# Patient Record
Sex: Female | Born: 1960
Health system: Southern US, Community
[De-identification: ages and names within clinical notes are randomized; demographics above are authoritative.]

## PROBLEM LIST (undated history)

## (undated) DIAGNOSIS — R0600 Dyspnea, unspecified: Secondary | ICD-10-CM

## (undated) DIAGNOSIS — J45909 Unspecified asthma, uncomplicated: Secondary | ICD-10-CM

## (undated) DIAGNOSIS — G4730 Sleep apnea, unspecified: Secondary | ICD-10-CM

## (undated) DIAGNOSIS — G5601 Carpal tunnel syndrome, right upper limb: Secondary | ICD-10-CM

## (undated) DIAGNOSIS — I10 Essential (primary) hypertension: Secondary | ICD-10-CM

## (undated) DIAGNOSIS — E119 Type 2 diabetes mellitus without complications: Secondary | ICD-10-CM

## (undated) DIAGNOSIS — K219 Gastro-esophageal reflux disease without esophagitis: Secondary | ICD-10-CM

## (undated) DIAGNOSIS — E7800 Pure hypercholesterolemia, unspecified: Secondary | ICD-10-CM

## (undated) DIAGNOSIS — G8929 Other chronic pain: Secondary | ICD-10-CM

## (undated) DIAGNOSIS — F419 Anxiety disorder, unspecified: Secondary | ICD-10-CM

## (undated) DIAGNOSIS — K2270 Barrett's esophagus without dysplasia: Secondary | ICD-10-CM

## (undated) DIAGNOSIS — M1990 Unspecified osteoarthritis, unspecified site: Secondary | ICD-10-CM

## (undated) DIAGNOSIS — F410 Panic disorder [episodic paroxysmal anxiety] without agoraphobia: Secondary | ICD-10-CM

## (undated) DIAGNOSIS — R7303 Prediabetes: Secondary | ICD-10-CM

## (undated) DIAGNOSIS — R112 Nausea with vomiting, unspecified: Secondary | ICD-10-CM

## (undated) DIAGNOSIS — I209 Angina pectoris, unspecified: Secondary | ICD-10-CM

## (undated) DIAGNOSIS — Z9889 Other specified postprocedural states: Secondary | ICD-10-CM

## (undated) HISTORY — PX: KNEE ARTHROSCOPY: SUR90

## (undated) HISTORY — PX: FOOT SURGERY: SHX648

## (undated) HISTORY — DX: Type 2 diabetes mellitus without complications: E11.9

## (undated) HISTORY — PX: ABDOMINAL HYSTERECTOMY: SHX81

## (undated) HISTORY — DX: Barrett's esophagus without dysplasia: K22.70

## (undated) HISTORY — PX: CHOLECYSTECTOMY: SHX55

## (undated) HISTORY — PX: HAND SURGERY: SHX662

---

## 1999-10-21 HISTORY — PX: PARTIAL HYSTERECTOMY: SHX80

## 2001-08-10 ENCOUNTER — Other Ambulatory Visit: Admission: RE | Admit: 2001-08-10 | Discharge: 2001-08-10 | Payer: Self-pay | Admitting: Obstetrics and Gynecology

## 2001-09-08 ENCOUNTER — Ambulatory Visit (HOSPITAL_COMMUNITY): Admission: RE | Admit: 2001-09-08 | Discharge: 2001-09-08 | Payer: Self-pay | Admitting: Obstetrics and Gynecology

## 2001-09-08 ENCOUNTER — Encounter: Payer: Self-pay | Admitting: Obstetrics and Gynecology

## 2003-06-12 ENCOUNTER — Emergency Department (HOSPITAL_COMMUNITY): Admission: EM | Admit: 2003-06-12 | Discharge: 2003-06-12 | Payer: Self-pay | Admitting: Emergency Medicine

## 2005-01-01 ENCOUNTER — Ambulatory Visit: Payer: Self-pay | Admitting: Cardiology

## 2005-01-03 ENCOUNTER — Ambulatory Visit: Payer: Self-pay | Admitting: Pulmonary Disease

## 2005-01-03 ENCOUNTER — Ambulatory Visit: Payer: Self-pay | Admitting: Cardiology

## 2005-01-03 ENCOUNTER — Encounter: Payer: Self-pay | Admitting: Cardiology

## 2005-01-03 ENCOUNTER — Inpatient Hospital Stay (HOSPITAL_COMMUNITY): Admission: AD | Admit: 2005-01-03 | Discharge: 2005-01-08 | Payer: Self-pay | Admitting: Cardiology

## 2005-01-06 ENCOUNTER — Encounter: Payer: Self-pay | Admitting: Cardiology

## 2005-01-07 ENCOUNTER — Encounter: Payer: Self-pay | Admitting: Cardiovascular Disease

## 2005-01-14 ENCOUNTER — Encounter: Payer: Self-pay | Admitting: Cardiology

## 2007-08-09 ENCOUNTER — Encounter: Payer: Self-pay | Admitting: Cardiology

## 2007-11-08 ENCOUNTER — Ambulatory Visit: Payer: Self-pay | Admitting: Orthopedic Surgery

## 2007-11-23 ENCOUNTER — Encounter: Payer: Self-pay | Admitting: Orthopedic Surgery

## 2007-12-01 ENCOUNTER — Ambulatory Visit: Payer: Self-pay | Admitting: Orthopedic Surgery

## 2007-12-28 ENCOUNTER — Ambulatory Visit: Payer: Self-pay | Admitting: Orthopedic Surgery

## 2007-12-31 ENCOUNTER — Ambulatory Visit (HOSPITAL_COMMUNITY): Admission: RE | Admit: 2007-12-31 | Discharge: 2007-12-31 | Payer: Self-pay | Admitting: Orthopedic Surgery

## 2007-12-31 ENCOUNTER — Ambulatory Visit: Payer: Self-pay | Admitting: Orthopedic Surgery

## 2008-01-04 ENCOUNTER — Ambulatory Visit: Payer: Self-pay | Admitting: Orthopedic Surgery

## 2008-01-12 ENCOUNTER — Ambulatory Visit: Payer: Self-pay | Admitting: Orthopedic Surgery

## 2008-02-07 ENCOUNTER — Encounter: Payer: Self-pay | Admitting: Orthopedic Surgery

## 2008-02-14 ENCOUNTER — Ambulatory Visit: Payer: Self-pay | Admitting: Orthopedic Surgery

## 2008-03-07 ENCOUNTER — Ambulatory Visit: Payer: Self-pay | Admitting: Orthopedic Surgery

## 2008-03-22 ENCOUNTER — Ambulatory Visit: Payer: Self-pay | Admitting: Orthopedic Surgery

## 2008-03-27 ENCOUNTER — Telehealth: Payer: Self-pay | Admitting: Orthopedic Surgery

## 2008-04-19 ENCOUNTER — Ambulatory Visit: Payer: Self-pay | Admitting: Orthopedic Surgery

## 2008-06-01 ENCOUNTER — Telehealth (INDEPENDENT_AMBULATORY_CARE_PROVIDER_SITE_OTHER): Payer: Self-pay | Admitting: *Deleted

## 2008-07-25 ENCOUNTER — Encounter: Payer: Self-pay | Admitting: Orthopedic Surgery

## 2008-07-31 ENCOUNTER — Ambulatory Visit: Payer: Self-pay | Admitting: Orthopedic Surgery

## 2008-08-07 ENCOUNTER — Telehealth: Payer: Self-pay | Admitting: Orthopedic Surgery

## 2008-08-07 ENCOUNTER — Encounter: Payer: Self-pay | Admitting: Orthopedic Surgery

## 2008-08-07 ENCOUNTER — Encounter (HOSPITAL_COMMUNITY): Admission: RE | Admit: 2008-08-07 | Discharge: 2008-09-06 | Payer: Self-pay | Admitting: Orthopedic Surgery

## 2008-08-15 ENCOUNTER — Encounter: Payer: Self-pay | Admitting: Cardiology

## 2008-08-21 ENCOUNTER — Ambulatory Visit (HOSPITAL_BASED_OUTPATIENT_CLINIC_OR_DEPARTMENT_OTHER): Admission: RE | Admit: 2008-08-21 | Discharge: 2008-08-21 | Payer: Self-pay | Admitting: Orthopedic Surgery

## 2008-08-22 ENCOUNTER — Telehealth: Payer: Self-pay | Admitting: Orthopedic Surgery

## 2008-10-18 ENCOUNTER — Encounter: Payer: Self-pay | Admitting: Family Medicine

## 2008-11-01 ENCOUNTER — Encounter: Payer: Self-pay | Admitting: Orthopedic Surgery

## 2009-05-31 ENCOUNTER — Ambulatory Visit: Payer: Self-pay | Admitting: Cardiology

## 2009-05-31 DIAGNOSIS — R079 Chest pain, unspecified: Secondary | ICD-10-CM | POA: Insufficient documentation

## 2009-06-05 ENCOUNTER — Encounter: Payer: Self-pay | Admitting: Cardiology

## 2009-06-05 ENCOUNTER — Ambulatory Visit: Payer: Self-pay | Admitting: Cardiology

## 2009-06-15 ENCOUNTER — Telehealth: Payer: Self-pay | Admitting: Cardiology

## 2009-06-18 ENCOUNTER — Encounter (INDEPENDENT_AMBULATORY_CARE_PROVIDER_SITE_OTHER): Payer: Self-pay | Admitting: *Deleted

## 2009-06-20 ENCOUNTER — Encounter: Payer: Self-pay | Admitting: Cardiology

## 2009-06-20 DIAGNOSIS — R0609 Other forms of dyspnea: Secondary | ICD-10-CM | POA: Insufficient documentation

## 2009-06-20 DIAGNOSIS — R0602 Shortness of breath: Secondary | ICD-10-CM | POA: Insufficient documentation

## 2009-07-19 ENCOUNTER — Encounter: Payer: Self-pay | Admitting: Cardiology

## 2009-07-20 ENCOUNTER — Ambulatory Visit: Payer: Self-pay | Admitting: Cardiology

## 2009-07-20 DIAGNOSIS — I359 Nonrheumatic aortic valve disorder, unspecified: Secondary | ICD-10-CM | POA: Insufficient documentation

## 2009-07-20 DIAGNOSIS — I080 Rheumatic disorders of both mitral and aortic valves: Secondary | ICD-10-CM | POA: Insufficient documentation

## 2010-05-01 ENCOUNTER — Ambulatory Visit: Payer: Self-pay | Admitting: Orthopedic Surgery

## 2010-05-01 DIAGNOSIS — G5600 Carpal tunnel syndrome, unspecified upper limb: Secondary | ICD-10-CM | POA: Insufficient documentation

## 2010-11-19 NOTE — Assessment & Plan Note (Signed)
Summary: rt wrist pain/2 carp tun releases by Dr. H/medcost/bsf   Visit Type:  Follow-up Primary Provider:  Crosby Oyster, PA-C  CC:  pain right wrist and hand.  History of Present Illness: I saw Rhonda Patton in the office today for a followup visit.  She is a 50 years old woman with the complaint of:  pain right wrist and right finger numbness.  she is status post 2 carpal tunnel releases on the RIGHT most recent in March of 2009 she's also had some flexor carpi radialis tendinitis and presents back with continued symptoms including some numbness and tingling in the carpal tunnel innervated areas of pain along the wrist and the FCR.  Previous treatment included Neurontin anti-inflammatories and steroids as well as a cast.  She couldn't tolerate the cast because of claustrophobia.  She had a repeat nerve conduction study after last surgery showed mild carpal tunnel currently taking Tylenol for pain without relief  NCS results taken 07-19-08.  Tylenol for pain no relief.  she has a wrist splint made at the hospital which is non-circumferential and she wears them intermittently     Allergies: 1)  ! Asa  Physical Exam  Additional Exam:  examination shows a well-developed well-nourished female in no acute distress normal pulses and perfusion with good capillary refill in the RIGHT hand.  Skin incision is somewhat hypertrophic and nontender.  She has decreased sensation in the index finger and long finger and somewhat in the thumb.  She is awake alert and oriented x3 mood and affect are normal  Inspection reveals no swelling over the incision but tenderness range of motion is normal, strength is weak.  Wrist is stable.  Tinel's is negative.   Impression & Recommendations:  Problem # 1:  CARPAL TUNNEL SYNDROME (ICD-354.0) Assessment Unchanged  I would say that she is really failed to carpal tunnel releases she also has some flexor carpi radialis tendinitis and what I would  now, chronic pain  Recommend anti-inflammatories and Neurontin him back 3 months reassess  Orders: Est. Patient Level III (62836)  Medications Added to Medication List This Visit: 1)  Neurontin 100 Mg Caps (Gabapentin) .Marland Kitchen.. 1 by mouth three times a day, increase to 2 tabs three times a day then 3 tabs three times a day 2)  Etodolac 300 Mg Caps (Etodolac) .Marland Kitchen.. 1 by mouth two times a day  Patient Instructions: 1)  f/u 3 months  Prescriptions: ETODOLAC 300 MG CAPS (ETODOLAC) 1 by mouth two times a day  #60 x 5   Entered and Authorized by:   Fuller Canada MD   Signed by:   Fuller Canada MD on 05/01/2010   Method used:   Print then Give to Patient   RxID:   [id number] NEURONTIN 100 MG CAPS (GABAPENTIN) 1 by mouth three times a day, increase to 2 tabs three times a day then 3 tabs three times a day  #90 x 5   Entered and Authorized by:   Fuller Canada MD   Signed by:   Fuller Canada MD on 05/01/2010   Method used:   Print then Give to Patient   RxID:   [id number]

## 2011-03-04 NOTE — Op Note (Signed)
Rhonda Patton, LOYAL           ACCOUNT NO.:  [id number]   MEDICAL RECORD NO.:  [id number]          PATIENT TYPE:  AMB   LOCATION:  [facility]                         FACILITY:  [facility]   PHYSICIAN:  Marlowe Kays, M.D.  DATE OF BIRTH:  [birth year]   DATE OF PROCEDURE:  08/21/2008  DATE OF DISCHARGE:                               OPERATIVE REPORT   PREOPERATIVE DIAGNOSES:  1. Recurrent tear of medial meniscus.  2. Osteoarthritis right knee.   POSTOPERATIVE DIAGNOSES:  1. Recurrent tear of medial meniscus.  2. Osteoarthritis right knee.  3. Torn lateral meniscus right knee.   OPERATION:  Right knee arthroscopy with partial medial and lateral  meniscectomy.   SURGEON:  Marlowe Kays, M.D.   ASSISTANT:  Nurse.   ANESTHESIA:  General.   PATHOLOGY AND JUSTIFICATION FOR PROCEDURE:  She had a right knee  arthroscopy with partial medial meniscectomy in May 2007, elsewhere.  She saw me because of progressive pain in the inner aspect of her right  knee with an MRI on July 21, 2008, demonstrating a recurrent medial  meniscus tear.  There was also felt to be patellofemoral and medial  compartment arthritis in her right knee   DESCRIPTION OF PROCEDURE:  Satisfactory general anesthesia, Ace wrap to  the left leg, a pneumatic tourniquet to the right lower extremity with  right leg Esmarched out non-sterilely and tourniquet inflated to 350  mmHg, knee support, the right leg was prepped with DuraPrep from support  to ankle and draped in a sterile field.  Superomedial saline inflow.  First from an anterolateral portal, the medial compartment of the knee  joint was evaluated.  She had marked fraying and tearing of the entire  medial meniscus from the mid portion of the middle segment all the way  into the intercondylar area.  I trimmed this back to a stable rim with a  combination of baskets and a 3.5 shaver with the final remnant being  stable on probing.  She had some grade 1-2  chondromalacia of her medial  joint, but nothing that appeared to be terribly significant.  Looking up  in the medial gutter and suprapatellar area, the patellar surface had  nothing shaveable.  I then reversed portals.  The lateral compartment  demonstrated some wear of the lateral tibial plateau and marked fraying  of the entire inner aspect of lateral meniscus, which I shaved down  until smooth with a final picture being taken.  The knee joint was then  irrigated until clear and all fluid possible removed.  I closed the two  anterior portals with 4-0 nylon and then injected through the inflow  apparatus 20  mL of one-half percent Marcaine and adrenaline and 4 mg of morphine.  This portal was then closed with 4-0 nylon as well.  Betadine adaptic  dry sterile dressing were applied.  Tourniquet was released.  She  tolerated the procedure well and was taken to the recovery room in  satisfactory condition with no known complications.           ______________________________  Marlowe Kays, M.D.     JA/MEDQ  D:  08/21/2008  T:  08/21/2008  Job:  [id number]

## 2011-03-04 NOTE — Assessment & Plan Note (Signed)
[facility]                          [facility] NOTE   Rhonda Patton, Rhonda Patton                  MRN:          [id number]  DATE:05/31/2009                            DOB:          [birth year]    HISTORY:  Ms. Mcleish is seen for Cardiology followup.  She had been  seen in our office in the past.  She underwent cardiac catheterization  in 2006.  At that time, her pulmonary pressures were normal.  She had  hyperventilation in the cath lab.  She had some abnormality of the LAD  that was minor.  There was slight decreased flow down the LAD, but there  was no high-grade stenosis.  She had normal right heart pressures.  She  had vigorous LV function.  There was no step-up in the right heart  chambers.  It was felt that pulmonary evaluation might be helpful.  The  patient did have an overnight polysomnogram.  This was read by Dr. Ronnette Juniper.  The patient has mild obstructive sleep apnea with apnea/hypopnea  index of 8 and oxygen desaturation to 80%.  It was felt that she was  mainly symptomatic in the supine position and she should avoid sleeping  supplying.  There should be treatment of her nasal congestion.  She  should lose weight.  She should avoid alcohol and sedatives prior to  bedtime.  If this did not help, she could be considered for CPAP.   We have not seen her in the office for Cardiology follow-up since then.   Most recently she says that she has had shortness of breath for  approximately a month, worse in the last 2 weeks.  There is no clear-cut  chest pain.  There is no syncope or presyncope.   PAST MEDICAL HISTORY:   ALLERGIES:  No known drug allergies.   MEDICATIONS:  triamterene and hydrochlorothiazide, estrogen, diclofenac,  and amlodipine.   OTHER MEDICAL PROBLEMS:  See the list below.   REVIEW OF SYSTEMS:  Today, she denies fever, chills, sweats, headache,  skin rash, change in vision, change in hearing, GI symptoms,  or GU  symptoms.  She is significantly overweight.  All other systems are  reviewed and are negative.  She also has some discomfort in her neck.   PHYSICAL EXAMINATION:  VITAL SIGNS:  Blood pressure 130/86 with a pulse  of 69.  Her weight is 245 pounds.  Her O2 sat is 98% on room air.  GENERAL:  The patient is oriented to person, time, and place.  Affect is  normal.  HEENT:  No xanthelasma.  She has normal extraocular motion.  There are  no carotid bruits.  There is no jugular venous distention.  LUNGS:  Clear.  Respiratory effort is not labored.  CARDIAC:  S1 and S2.  There are no clicks or significant murmurs.  ABDOMEN:  Obese.  There is no significant peripheral edema.   EKG is normal.   PROBLEMS:  1. History of normal coronary arteries with slight decreased flow down      the left anterior descending in the past with no significant  obstruction.  2. History of normal left ventricular function in the past.  3. No evidence of pulmonary hypertension or shunt in the past.  4. Evidence of sleep apnea as described above in the history of      present illness.  5. Significant obesity.  6. Shortness of breath recently.  Etiology is not clear to me at this      point.  I am not convinced of heart failure.  We do need to rule      out change in her LV function.  The patient will have a chest x-ray      and a 2-D echo and we will see her back for followup.  In the      meantime, I try to give her reassurance saying that I thought that      her overall cardiac status is stable.     Luis Abed, MD, [facility]  Electronically Signed    JDK/MedQ  DD: 05/31/2009  DT: 06/01/2009  Job #: [id number]   cc:   Ernst Breach, PA-C

## 2011-03-04 NOTE — H&P (Signed)
Rhonda Patton, Rhonda Patton           ACCOUNT NO.:  [id number]   MEDICAL RECORD NO.:  [id number]          PATIENT TYPE:  AMB   LOCATION:  DAY                           FACILITY:  [facility]   PHYSICIAN:  Vickki Hearing, M.D.DATE OF BIRTH:  27-Sep-1961   DATE OF ADMISSION:  12/30/2007  DATE OF DISCHARGE:  LH                              HISTORY & PHYSICAL   CHIEF COMPLAINT:  Carpal tunnel syndrome, right upper extremity.   HISTORY OF PRESENT ILLNESS:  A 51 year old female complains of right  hand pain and numbness in a median nerve distribution.  She had nerve  conduction study which indicated swelling of the nerve consistent with  carpal tunnel syndrome.  She is status post carpal tunnel release 8  years ago, however, at this time she is having constant pain which is  worse in the morning.  There is numbness at the old incision site and  numbness of the long finger of the right hand.  Pain radiates up into  the palm and forearm and not above.  She took Tylenol for pain which did  not help.  She also took Neurontin and B6 without improvement.   We injected the carpal tunnel on March 11, to see if we could get any  improvement and it did not seem to help.   The patient really wants something done about her symptoms at this time.  She understands that she only has a 75% chance of improvement for a  revision with a carpal tunnel release.  She wishes to proceed ahead.  Informed consent was done in the office.   ALLERGIES:  No known drug allergies.   PAST MEDICAL HISTORY:  1. Reflux.  2. Sinusitis.  3. Arthritis.   PAST SURGICAL HISTORY:  1. Hysterectomy.  2. Gallbladder removed.  3. Three cesarean sections.  4. Surgery on her foot.  5. Right carpal tunnel release in 2000.   FAMILY HISTORY:  Family history of coronary artery disease, arthritis,  cancer as well as diabetes.   SOCIAL HISTORY:  She is married.  She does not smoke or drink.   REVIEW OF SYSTEMS:  She has reflux,  headache, joint pain and swelling.   PHYSICAL EXAMINATION:  GENERAL:  Appearance is normal.  Grooming and  hygiene are normal.  No gross deformities are noted.  SKIN:  There is a scar noted.  EXTREMITIES:  Nontender, healed without any erythema.  There is no  deformity, ecchymosis or swelling in the right upper extremity.  There  is tenderness over the carpal tunnel, however, and also over the flexor  carpi radialis and the palmaris longus.  Vascular exam shows normal  radial, ulnar, brachial and axillary pulses.  Motor strength in upper  extremities is normal except for some slight weakness in the right hand  in terms of grip strength.  Her wrist is stable.  She has full range of  motion.  She has 80 degrees of flexion and extension.  Radial and ulnar  deviation are normal.   ASSESSMENT:  Neurology study from [facility] indicates mild  right median neuropathy consistent with carpal tunnel  syndrome.  The  nerve was swollen to a size of 18-mm versus 11-mm on the opposite side.   PLAN:  Right carpal tunnel release.      Vickki Hearing, M.D.  Electronically Signed     SEH/MEDQ  D:  12/30/2007  T:  12/31/2007  Job:  [id number]   cc:   [facility]  Fax: [phone]

## 2011-03-04 NOTE — Op Note (Signed)
NAMEJOANIE, Rhonda Patton           ACCOUNT NO.:  [id number]   MEDICAL RECORD NO.:  [id number]          PATIENT TYPE:  AMB   LOCATION:  DAY                           FACILITY:  [facility]   PHYSICIAN:  Vickki Hearing, M.D.DATE OF BIRTH:  Mar 02, 1961   DATE OF PROCEDURE:  12/31/2007  DATE OF DISCHARGE:                               OPERATIVE REPORT   HISTORY:  A 50 year old female with recurrent carpal tunnel syndrome  complaining of pain and numbness in the median nerve distribution with a  positive nerve conduction study, status post carpal tunnel release 8  years ago.  She was treated with Tylenol, Neurontin, B6 and did not  improve.  She also had bracing.   Informed consent done in the office.  The patient understands 75% chance  of this helping.   PREOPERATIVE DIAGNOSIS:  Recurrent carpal tunnel syndrome on the right  wrist.   POSTOPERATIVE DIAGNOSIS:  Recurrent carpal tunnel syndrome on the right  wrist.   PROCEDURE:  Revision carpal tunnel release of the right upper extremity.   SURGEON:  Vickki Hearing, M.D.   ASSISTANT:  None.   ANESTHESIA:  Regional anesthetic.   FINDINGS:  Swelling of the median nerve proximal and beneath the carpal  tunnel.   SPECIMENS:  None.   ESTIMATED BLOOD LOSS:  None.   COMPLICATIONS:  None.   COUNTS:  Correct.   DISPOSITION:  The patient to PACU in good condition.   PROCEDURE:  After proper identification and marking of the surgical site  by the patient and physician, history and physical was updated.  The  patient was taken to surgery, given antibiotics, had a regional block.  The right arm was prepped with DuraPrep and draped sterilely.  A timeout  procedure was completed.   The previous incision was used, and then the incision was taken across  the wrist in a curved fashion and then straightened as it went  proximally.  The subcutaneous tissue was divided.  Blunt dissection was  carried down to the distal portion of the  transverse carpal ligament.  The transverse carpal ligament was released.  The median nerve was  released throughout the incision and was found to be intact with  swelling somewhat proximal and also under the transverse carpal  ligament.  No space-occupying lesions were noted.   The wound was irrigated and closed with 3-0 nylon sutures, injected with  10 mL of Marcaine, and she was taken to the recovery room in stable  condition.   Followup will be Monday.  She is discharged on Lorcet Plus for pain  relief.      Vickki Hearing, M.D.  Electronically Signed     SEH/MEDQ  D:  12/31/2007  T:  01/01/2008  Job:  [id number]

## 2011-03-07 NOTE — Discharge Summary (Signed)
Rhonda Patton, FAVOR NO.:  [id number]   MEDICAL RECORD NO.:  [id number]          PATIENT TYPE:  INP   LOCATION:  4709                         FACILITY:  [facility]   PHYSICIAN:  Jonelle Sidle, M.D. LHCDATE OF BIRTH:  [birth year]   DATE OF ADMISSION:  01/03/2005  DATE OF DISCHARGE:  01/08/2005                                 DISCHARGE SUMMARY   PROCEDURES:  1.  Cardiac catheterization.  2.  Right heart catheterization.  3.  Coronary arteriogram.  4.  Left ventriculogram.  5.  Pulmonary function testing.  6.  Abdominal x-ray.   DISCHARGE DIAGNOSES:  1.  Chest pain, no critical coronary artery disease by catheterization, mild      irregularity secondary to spasm.  2.  Low pO2 at baseline, status post pulmonary consult with no evidence of      pulmonary pathology, empiric proton pump inhibitor and followup p.r.n.  3.  Status post echocardiogram with mild left ventricular hypertrophy,      normal ejection fraction and normal valves, no cardiopulmonary disease.  4.  Possible obstructive sleep apnea, by description, follow up at [facility] for sleep study on January 14, 2005.  5.  Gastroesophageal reflux disease.  6.  Status post cholecystectomy with a history of diarrhea, status post      abdominal x-ray showing a large amount of stool compatible with      constipation.  7.  Mild hyperlipidemia with a total cholesterol of 185, triglycerides 114,      HDL 48, LDL 114, checked this admission.   HOSPITAL COURSE:  Rhonda Patton is a 50 year old female with no known  history of coronary artery disease.  She went to [facility] for chest  pain on January 01, 2005.  She was evaluated by Dr. Diona Browner there.  She came  to the office after discharge with chest pressure and was admitted for  further evaluation and treatment.   She continued to have episodes of chest tightness that were not exertional.  Some of them were described as a tightness and some as  a sharp chest pain.  She got some relief with nitroglycerin.  Her cardiac enzymes were negative.  Her D-dimer was within normal limits, so no CT was performed.  Her cardiac  catheterization was performed on January 06, 2005.  The cardiac  catheterization showed mild irregularities only and questionable spasm in  the RCA.  There was no obstructive disease, although there was a question of  slow flow in the LAD.  She had normal right heart pressure.  She had low pO2  at baseline.  An echo and PFTs were ordered.  Her spirometry was normal.  Her MBVs were low, but this is very effort-dependent.  She was evaluated by  Dr. Sung Amabile, who found no evidence of pulmonary pathology.  Dr. Dietrich Pates  evaluated the echocardiogram and found no evidence of cardiac pathology.  She had an episode of sinus tachycardia of unclear etiology with no  associated symptoms, and this resolved.  Her heart rate got to 130.  [name] was considered stable for  discharge on January 08, 2005, with  outpatient followup arranged.   DISCHARGE INSTRUCTIONS:  1.  Her activity level is to include no strenuous activity for two days.  2.  She is to call the office for problems with the cath site.  3.  She is to stick to a low-fat diet.  4.  She has a follow-up appointment with Dr. Diona Browner on April 6th, and she      is to follow up with Dr. Sherryll Burger.  5.  She is to keep the sleep study appointment.   DISCHARGE MEDICATIONS:  1.  Cipro 250 mg b.i.d.  2.  Aspirin 81 mg q.d.  3.  Triamterene/HCTZ 37.5/25 mg q.d.  4.  Protonix 40 mg q.d.      RB/MEDQ  D:  01/08/2005  T:  01/08/2005  Job:  [id number]   cc:   Dr. Sherryll Burger

## 2011-03-07 NOTE — H&P (Signed)
Rhonda Patton, Rhonda Patton NO.:  [id number]   MEDICAL RECORD NO.:  [id number]          PATIENT TYPE:  INP   LOCATION:                               FACILITY:  [facility]   PHYSICIAN:  Jonelle Sidle, M.D. LHCDATE OF BIRTH:  [birth year]   DATE OF ADMISSION:  01/03/2005  DATE OF DISCHARGE:                                HISTORY & PHYSICAL   CHIEF COMPLAINT:  Chest discomfort.   HISTORY OF PRESENT ILLNESS:  A 50 year old [place] American female who was  seen in consultation January 01, 2005, at [facility] for chest  discomfort.  She does not have a history of coronary artery disease,  hypertension, hyperlipidemia, diabetes, or smoking.  She is mildly  overweight, but she does have a positive family history of coronary disease.  She has been under some stress as of late.  She presented to the hospital  with two to three week h of chest pain which was gradual in onset, described  as a substernal pressure with radiation to her right neck, right arm and  right subscapular area.  It seems this can occur at rest or with exertion  and lasts 30 minutes.  She has been having two and three episodes a day for  the last two to three weeks.  Relief is obtained with sitting and resting.  She has associated shortness of breath and some diaphoresis.  However, she  has been going through some premenopausal hot flashes which can occur  independent of her chest discomfort.  She also has some associated nausea,  weakness and fatigue and no energy.  Her troponins were negative x3.  EKG  did not show signs of acute ischemia and she was due to have a CT angio per  Dr. Andee Lineman.  Unfortunately, there was trouble with the machine and with  scheduling and she was scheduled to have an elective outpatient  catheterization this Wednesday.  However, she presents into the office today  complaining of severe substernal chest discomfort.  EKG was done and does  not show any acute change.  However, she  still had persistent chest  discomfort and was given one sublingual nitroglycerin here in the office  which promptly relieved her pain.  Because of this, she is being admitted  now to [facility].  She will convalesce over the  weekend and be cathed the first part of the week or sooner if she has  recurrent pain with EKG changes over the weekend.   Cardiac review of systems is positive for chronic lower extremity edema  which sounds dependent and occurs at the end of the day.  She has some  dyspnea on exertion which she attributes to being overweight and out of  shape.  She has no PND, orthopnea, CVA, TIA, syncope, presyncope, dizziness,  heart failure, MI, claudication or palpitations.  Cardiac risks are negative  for hypertension, hyperlipidemia, diabetes, or smoking.  There is a positive  family history for coronary disease, which will be outlined below.  She is  under an increased amount of stress as of late.  She is status  post partial  hysterectomy and is having some perimenopausal symptoms  with diaphoresis  and hot flashes.  She is active.  She is overweight but not obese.   MEDICATIONS:  1.  Maxzide 37.5/25 daily.  2.  Estropipate 1.2 mg daily.  3.  We have started her on aspirin 81 mg daily.  4.  Protonix 40 mg daily.   ALLERGIES:  No known drug allergies.   REVIEW OF SYMPTOMS:  General health has been good except for her  perimenopausal symptoms.  In talking with her further, she has significant  symptoms suggestive of obstructive sleep apnea which include snoring and  snorting which awakes her husband.  She is tired, fatigued and awakes with a  headache.  She has restless leg syndrome and could fall asleep  inappropriately and on occasions has almost fallen asleep behind the wheel  of a motor vehicle.  GI is positive for chronic diarrhea  after having had  cholecystectomy, she claims things just haven't been right.  She also has  symptoms of GERD  which can be confusing the above presenting symptoms.  She  has chronic headaches which she attributes to sinus problems, depression and  chronic low back pain.   PAST MEDICAL HISTORY:  1.  Positive for possible hypertension per her medication list unless this      is being used solely for edema.  2.  There is no history of known coronary disease, hyperlipidemia or      diabetes.  3.  She has a history of GERD.  4.  Depression.  5.  Osteoarthritis.  6.  Sinus problems.  7.  Status post partial hysterectomy and cholecystectomy.   SOCIAL HISTORY:  She has three grown children.  She is a Futures trader.  She  does not smoke tobacco or drink alcohol.  She is married.   FAMILY HISTORY:  Her mother died at age 29 with congestive heart failure but  had an MI in her early 76s.  Otherwise her immediate family history is  unremarkable for premature coronary disease.   PHYSICAL EXAMINATION:  GENERAL APPEARANCE:  A pleasant, articulate, alert  and oriented African American female complaining of substernal chest  pressure and tightness which was relieved promptly with one sublingual  nitroglycerin.  VITAL SIGNS:  Blood pressure 122/70 which actually went down to 94/69 after  nitroglycerin.  Pulse 58 and regular, weight 223.  HEENT:  Grossly normal.  NECK:  Without carotid bruits, detectable thyromegaly, adenopathy or JVD.  It was supple.  LUNGS:  Clear to auscultation and percussion with good bilateral chest  expansion without any wheezing, rhonchi or rales.  CARDIOVASCULAR:  Heart sounds are regular with normal S1 and S2 without any  murmurs, rubs, or clicks or anterior chest wall tenderness.  ABDOMEN:  Obese, soft and nontender.  Bowel sounds are present.  EXTREMITIES:  Pedal pulses are intact at 2+.  She has trace nonpitting  edema.  NEUROLOGIC:  Nonfocal.   EKG shows normal sinus rhythm, normal axis with no acute STT wave changes suggestive of ischemia.  Chest x-ray done December 31, 2004,  was unremarkable.   LABORATORY DATA:  December 31, 2004, glucose 96, BUN 9, creatinine 1.0.  LDL  was mildly elevated at 138 in January of 2004.  Potassium was low on December 31, 2004, at 3.3, probably secondary to her diuretics.  Troponins were  negative x3.  Repeat troponins have been ordered.  Hemoglobin 13.9,  hematocrit 40, platelet count 224,000.  IMPRESSION:  1.  Chest discomfort with some symptoms that are atypical and typical in a      50 year old [place] American female with a history of some mild      hypertension.  She was ruled out for myocardial infarction, scheduled to      have a CT angio but because of scheduling and equipment problems, this      was not done.  She was set up to have elective catheterization this      coming Wednesday, however, she presented to the office today with      substernal pressure which was promptly relieved with one nitroglycerin.      Therefore, she is being admitted to be watched over the weekend and      catheterization early part of next week.  2.  Gastroesophageal reflux disease.  3.  Status post cholecystectomy with chronic diarrhea.  4.  Perimenopausal symptoms with hot flashes.  5.  Probable obstructive sleep apnea.  She has elected to have a sleep study      done here at [facility] once her cardiac work-up is complete.      ________________________________________  Suszanne Conners. Julious Oka.  ___________________________________________  Jonelle Sidle, M.D. [facility]    MRD/MEDQ  D:  01/03/2005  T:  01/03/2005  Job:  [id number]   cc:   Dr. Clelia Croft

## 2011-03-07 NOTE — Cardiovascular Report (Signed)
NAMEBRALEE, FELDT           ACCOUNT NO.:  [id number]   MEDICAL RECORD NO.:  [id number]          PATIENT TYPE:  INP   LOCATION:  4709                         FACILITY:  [facility]   PHYSICIAN:  Arturo Morton. Riley Kill, M.D. [facility] OF BIRTH:  09/08/1961   DATE OF PROCEDURE:  01/06/2005  DATE OF DISCHARGE:                              CARDIAC CATHETERIZATION   INDICATIONS:  Ms. Hillesheim is a 50 year old woman who presents with chest  discomfort. She apparently had a mother she lost due to congestive heart  failure. She has been fairly anxious. She was admitted and had negative  enzymes.  There was borderline CPK elevation but the MBs were negative. The  current study was done to assess coronary anatomy.   PROCEDURE:  1.  Right and left heart catheterization.  2.  Selective coronary arteriography.  3.  Selective left ventriculography.   DESCRIPTION OF PROCEDURE:  The patient was brought to the catheterization  laboratory, prepped and draped in usual fashion.  Through an anterior  puncture, the right femoral artery was entered with the first stick. A 6-  French sheath was placed. As we began to place a pigtail catheter up into  the central aorta, the patient became nauseated and vomited. She then began  to hyperventilate. With the hyperventilation, the blood pressure came down  slightly, and the heart rate rose up into the 120 range. We were able to  obtain a paper bag, and some low dose oxygen as well as a paper bag was  placed over the patient's mouth. Over about a 10 to 15-minute period the  heart rate gradually slowly came down into the 80s and the blood pressure  rose appropriately. Ventriculography was performed in the RAO projection.  Following this, coronary arteriography was performed in multiple  angiographic projections in both the left and right coronary arteries. There  was some ostial spasm of the right coronary artery, and repeat shots taken  nearly outside the ostium  demonstrated no significant narrowing. Two blood  gases were obtained in revealed pO2 of 62 and 59. Because of this,  I  elected to perform right heart catheterization given the patient's chest  pain, shortness of breath and uncertainty as to diagnosis and question of  sleep apnea.  The femoral vein was punctured with an anterior puncture and a  7-French sheath placed.  A saturation was obtained in the superior vena cava  and sequential pressures were measured throughout the right heart.  Saturation was obtained in the pulmonary artery. Thermodilution cardiac  outputs were performed. Following this, the right heart catheter was  removed.  The patient was taken to the holding area in satisfactory clinical  condition where all sheaths were removed with direct manual and device  hemostasis. There were no complications. I then reviewed the films with her  husband in the catheterization suite.   HEMODYNAMIC DATA.:  1.  Right atrium seven  2.  Right ventricle 19/5.  3.  Pulmonary artery 15/6, mean 11.  4.  Pulmonary capillary wedge 9.  5.  Aortic 103/63.  6.  Left ventricle 109/7.  7.  Superior cava saturation  70  8.  PA saturation 72.  9.  Aortic saturation 99%.  10. Thermodilution cardiac index 3.2 liters per minute cardiac output.  11. Thermodilution cardiac index 1.6 liters per minute per meter squared.  12. Thermodilution Fick cardiac output 4.7 liters per minute.  13. Fick cardiac index 2.36 liters per minute per meter squared.   ANGIOGRAPHIC DATA:  Ventriculography was performed in the RAO projection.  Because of ventricular ectopy and tachycardia, ejection fraction could not  be calculated but was vigorous and there did not appear to be significant  mitral regurgitation.  1.  The left main coronary artery was large and free of critical disease.  2.  The left anterior descending artery coursed to the apex. There was minor      luminal irregularity at the LAD diagonal bifurcation.   There was      somewhat delayed flow down the LAD but again no obvious high-grade areas      of focal stenosis.  3.  The circumflex provided two large marginal branches. There is minor      irregularity at the ostium of the marginal branch. Otherwise vessels      large in caliber and free of significant disease.  4.  The right coronary artery is a modest size vessel.  On the initial two      injections there is evidence of some catheter damping in the proximal      vessel but with withdrawal of the catheter and repeat injections, this      was no longer present.  The remainder of the vessel was without      significant focal narrowing.   IMPRESSION:  1.  Vigorous left ventricular function.  2.  Hyperventilation in the catheterization laboratory relieved by      rebreathing CO2.  3.  No critical high-grade obstruction in the coronary vessels with the      abnormality in the left anterior descending as noted above.  4.  Normal right heart pressures.  5.  No significant saturation step-up through the right heart chambers.  6.  Borderline low pO2 at baseline.  7.  Normal Fick cardiac output.   RECOMMENDATIONS:  1.  Two-dimensional echocardiography.  2.  Pulmonary function testing to exclude underlying significant pulmonary      disease.  3.  Recheck ABG in the a.m.      TDS/MEDQ  D:  01/06/2005  T:  01/06/2005  Job:  [id number]   cc:   Kirstie Peri, MD  [address]  [place] [address]  Fax: [phone]   Jonelle Sidle, M.D. [facility]

## 2011-04-11 ENCOUNTER — Other Ambulatory Visit: Payer: Self-pay | Admitting: Medical

## 2011-07-14 LAB — BASIC METABOLIC PANEL
Calcium: 9
Chloride: 103
Creatinine, Ser: 1.02
GFR calc Af Amer: >60

## 2011-07-14 LAB — HEMOGLOBIN AND HEMATOCRIT, BLOOD: Hemoglobin: 13.4

## 2011-07-22 LAB — POCT I-STAT 4, (NA,K, GLUC, HGB,HCT)
Hemoglobin: 6.1 — CL
Sodium: 140

## 2011-08-15 ENCOUNTER — Other Ambulatory Visit: Payer: Self-pay | Admitting: Medical

## 2011-08-15 NOTE — Telephone Encounter (Signed)
This was a patient of mine from [place]. You can call and welcome her to see me here if the drive is not too far.  Let her know that I have moved to [place] at this office.   Any current refills though will need to go through [facility].

## 2011-08-15 NOTE — Telephone Encounter (Signed)
Patient would lika a refill on Lortab. CLS

## 2011-12-12 ENCOUNTER — Other Ambulatory Visit: Payer: Self-pay | Admitting: Medical

## 2012-03-03 ENCOUNTER — Encounter: Payer: Self-pay | Admitting: Orthopedic Surgery

## 2012-03-03 ENCOUNTER — Ambulatory Visit (INDEPENDENT_AMBULATORY_CARE_PROVIDER_SITE_OTHER): Payer: Self-pay | Admitting: Orthopedic Surgery

## 2012-03-03 ENCOUNTER — Telehealth: Payer: Self-pay | Admitting: Orthopedic Surgery

## 2012-03-03 ENCOUNTER — Encounter (HOSPITAL_COMMUNITY): Payer: Self-pay | Admitting: Pharmacy Technician

## 2012-03-03 VITALS — BP 102/72 | Ht 63.0 in | Wt 248.0 lb

## 2012-03-03 DIAGNOSIS — S83242A Other tear of medial meniscus, current injury, left knee, initial encounter: Secondary | ICD-10-CM

## 2012-03-03 DIAGNOSIS — S83232A Complex tear of medial meniscus, current injury, left knee, initial encounter: Secondary | ICD-10-CM | POA: Insufficient documentation

## 2012-03-03 DIAGNOSIS — IMO0002 Reserved for concepts with insufficient information to code with codable children: Secondary | ICD-10-CM

## 2012-03-03 MED ORDER — HYDROCODONE-ACETAMINOPHEN 7.5-325 MG PO TABS: 1.0000 | ORAL_TABLET | ORAL | Status: DC | PRN

## 2012-03-03 NOTE — Patient Instructions (Signed)
20 Rhonda Patton  03/03/2012   Your procedure is scheduled on: 5.20.13  Report to [facility] at 0900 AM.  Call this number if you have problems the morning of surgery: [phone]   Remember:   Do not eat food:After Midnight.  May have clear liquids:until Midnight .  Clear liquids include soda, tea, black coffee, apple or grape juice, broth.  Take these medicines the morning of surgery with A SIP OF WATER: zantac, pain pill   Do not wear jewelry, make-up or nail polish.  Do not wear lotions, powders, or perfumes. You may wear deodorant.  Do not shave 48 hours prior to surgery. Men may shave face and neck.  Do not bring valuables to the hospital.  Contacts, dentures or bridgework may not be worn into surgery.  Leave suitcase in the car. After surgery it may be brought to your room.  For patients admitted to the hospital, checkout time is 11:00 AM the day of discharge.   Patients discharged the day of surgery will not be allowed to drive home.  Name and phone number of your driver: family  Special Instructions: CHG Shower Use Special Wash: 1/2 bottle night before surgery and 1/2 bottle morning of surgery.   Please read over the following fact sheets that you were given: Pain Booklet, MRSA Information, Surgical Site Infection Prevention, Anesthesia Post-op Instructions and Care and Recovery After Surgery  PATIENT INSTRUCTIONS POST-ANESTHESIA  IMMEDIATELY FOLLOWING SURGERY:  Do not drive or operate machinery for the first twenty four hours after surgery.  Do not make any important decisions for twenty four hours after surgery or while taking narcotic pain medications or sedatives.  If you develop intractable nausea and vomiting or a severe headache please notify your doctor immediately.  FOLLOW-UP:  Please make an appointment with your surgeon as instructed. You do not need to follow up with anesthesia unless specifically instructed to do so.  WOUND CARE INSTRUCTIONS (if applicable):   Keep a dry clean dressing on the anesthesia/puncture wound site if there is drainage.  Once the wound has quit draining you may leave it open to air.  Generally you should leave the bandage intact for twenty four hours unless there is drainage.  If the epidural site drains for more than 36-48 hours please call the anesthesia department.  QUESTIONS?:  Please feel free to call your physician or the hospital operator if you have any questions, and they will be happy to assist you.       Arthroscopic Procedure, Knee An arthroscopic procedure can find what is wrong with your knee. PROCEDURE Arthroscopy is a surgical technique that allows your orthopedic surgeon to diagnose and treat your knee injury with accuracy. They will look into your knee through a small instrument. This is almost like a small (pencil sized) telescope. Because arthroscopy affects your knee less than open knee surgery, you can anticipate a more rapid recovery. Taking an active role by following your caregiver's instructions will help with rapid and complete recovery. Use crutches, rest, elevation, ice, and knee exercises as instructed. The length of recovery depends on various factors including type of injury, age, physical condition, medical conditions, and your rehabilitation. Your knee is the joint between the large bones (femur and tibia) in your leg. Cartilage covers these bone ends which are smooth and slippery and allow your knee to bend and move smoothly. Two menisci, thick, semi-lunar shaped pads of cartilage which form a rim inside the joint, help absorb shock and stabilize  your knee. Ligaments bind the bones together and support your knee joint. Muscles move the joint, help support your knee, and take stress off the joint itself. Because of this all programs and physical therapy to rehabilitate an injured or repaired knee require rebuilding and strengthening your muscles. AFTER THE PROCEDURE  After the procedure, you will be  moved to a recovery area until most of the effects of the medication have worn off. Your caregiver will discuss the test results with you.   Only take over-the-counter or prescription medicines for pain, discomfort, or fever as directed by your caregiver.  SEEK MEDICAL CARE IF:   You have increased bleeding from your wounds.   You see redness, swelling, or have increasing pain in your wounds.   You have pus coming from your wound.   You have an oral temperature above 102 F (38.9 C).   You notice a bad smell coming from the wound or dressing.   You have severe pain with any motion of your knee.  SEEK IMMEDIATE MEDICAL CARE IF:   You develop a rash.   You have difficulty breathing.   You have any allergic problems.  Document Released: 10/03/2000 Document Revised: 09/25/2011 Document Reviewed: 04/26/2008 [facility] Patient Information 2012 [place], [place].

## 2012-03-03 NOTE — Telephone Encounter (Addendum)
[employer], PH# [phone], regarding out-patient surgery planned for 03/08/12 at [facility], CPT [id number].  Per automated voice response system, no pre-authorization is required for in-network providers, per REF # [id number].  Per representative, Ram R, provider relations department, patient has a $10,000 deductible, of which patient has not met any at this time.  After the $10,000 deductible is met, patient would also be responsible for 60%.  Follow up with patient to further review.  Hospital personnel aware.

## 2012-03-03 NOTE — Patient Instructions (Signed)
Arthroscopic Procedure, Knee An arthroscopic procedure can find what is wrong with your knee. PROCEDURE Arthroscopy is a surgical technique that allows your orthopedic surgeon to diagnose and treat your knee injury with accuracy. They will look into your knee through a small instrument. This is almost like a small (pencil sized) telescope. Because arthroscopy affects your knee less than open knee surgery, you can anticipate a more rapid recovery. Taking an active role by following your caregiver's instructions will help with rapid and complete recovery. Use crutches, rest, elevation, ice, and knee exercises as instructed. The length of recovery depends on various factors including type of injury, age, physical condition, medical conditions, and your rehabilitation. Your knee is the joint between the large bones (femur and tibia) in your leg. Cartilage covers these bone ends which are smooth and slippery and allow your knee to bend and move smoothly. Two menisci, thick, semi-lunar shaped pads of cartilage which form a rim inside the joint, help absorb shock and stabilize your knee. Ligaments bind the bones together and support your knee joint. Muscles move the joint, help support your knee, and take stress off the joint itself. Because of this all programs and physical therapy to rehabilitate an injured or repaired knee require rebuilding and strengthening your muscles. AFTER THE PROCEDURE  After the procedure, you will be moved to a recovery area until most of the effects of the medication have worn off. Your caregiver will discuss the test results with you.   Only take over-the-counter or prescription medicines for pain, discomfort, or fever as directed by your caregiver.    You have been scheduled for arthroscocpic knee surgery.  All surgeries carry some risk.  Remember you always have the option of continued nonsurgical treatment. However in this situation the risks vs. the benefits favor surgery as  the best treatment option. The risks of the surgery includes the following but is not limited to bleeding, infection, pulmonary embolus, death from anesthesia, nerve injury vascular injury or need for further surgery, continued pain.  Specific to this procedure the following risks and complications are rare but possible Stiffness, pain, weakness, giving out  I expect  recovery will be in 3-4 weeks some patients take 6 weeks.  You  will need physical therapy after the procedure  Stop any blood thinning medication: such as warfarin, coumadin, naprosyn, ibuprofen, advil, diclofenac, aspirin  

## 2012-03-03 NOTE — Progress Notes (Signed)
  Subjective:    Rhonda Patton is a 51 y.o. female who presents gradual onset of left knee pain since March of this year. The patient reports sharp and stabbing 9/10 constant knee pain with locking catching and swelling. Her pain is increased when she's standing on it and it is relieved by no measures to this point. There was no actual trauma. The symptoms have worsened. The following portions of the patient's history were reviewed and updated as appropriate: allergies, current medications, past family history, past medical history, past social history, past surgical history and problem list.    The review of systems is notable for weight gain blurred vision chest pain shortness of breath tightness in the chest heartburn numbness and tingling unrelated to this injury, anxiety, seasonal allergies and painful urination which is intermittent   Review of Systems Pertinent items are noted in HPI.   Objective:    BP 102/72  Ht 5\' 3"  (1.6 m)  Wt 248 lb (112.492 kg)  BMI 43.93 kg/m2 Vital signs are stable as recorded  General appearance is normal  The patient is alert and oriented x3  The patient's mood and affect are normal  Gait assessment: She is ambulating with assistive device and she is limping favoring the left lower extremity The cardiovascular exam reveals normal pulses and temperature without edema swelling.  The lymphatic system is negative for palpable lymph nodes  The sensory exam is normal.  There are no pathologic reflexes.  Balance is normal.  Upper extremity exam  Inspection and palpation revealed no abnormalities in the upper extremities.  Range of motion is full without contracture.  Motor exam is normal with grade 5 strength.  The joints are fully reduced without subluxation.  There is no atrophy or tremor and muscle tone is normal.  All joints are stable.  The right lower extremity has no swelling, range of motion is normal, strength is normal,  stability is normal, skin is normal.  Exam of the left knee Inspection there is a joint effusion there is tenderness in the medial joint line Range of motion limited to 110 Stability normal ligaments Strength normal Skin normal         X-ray the x-ray does not show any acute trauma  Assessment:    Left Severe meniscal injury on the left    Plan:    The patient was given the choice of nonoperative care, MRI or surgical evaluation. She wishes to proceed with arthroscopic knee surgery and partial medial meniscectomy

## 2012-03-04 ENCOUNTER — Encounter (HOSPITAL_COMMUNITY)
Admission: RE | Admit: 2012-03-04 | Discharge: 2012-03-04 | Disposition: A | Discharge status: Home / Self Care | Payer: Managed Care, Other (non HMO) | Source: Ambulatory Visit | Attending: Orthopedic Surgery | Admitting: Orthopedic Surgery

## 2012-03-04 ENCOUNTER — Encounter (HOSPITAL_COMMUNITY): Payer: Self-pay

## 2012-03-04 ENCOUNTER — Other Ambulatory Visit: Payer: Self-pay

## 2012-03-04 HISTORY — DX: Unspecified osteoarthritis, unspecified site: M19.90

## 2012-03-04 HISTORY — DX: Sleep apnea, unspecified: G47.30

## 2012-03-04 HISTORY — DX: Carpal tunnel syndrome, right upper limb: G56.01

## 2012-03-04 HISTORY — DX: Essential (primary) hypertension: I10

## 2012-03-04 HISTORY — DX: Pure hypercholesterolemia, unspecified: E78.00

## 2012-03-04 HISTORY — DX: Nausea with vomiting, unspecified: R11.2

## 2012-03-04 HISTORY — DX: Angina pectoris, unspecified: I20.9

## 2012-03-04 HISTORY — DX: Gastro-esophageal reflux disease without esophagitis: K21.9

## 2012-03-04 HISTORY — DX: Anxiety disorder, unspecified: F41.9

## 2012-03-04 HISTORY — DX: Nausea with vomiting, unspecified: Z98.890

## 2012-03-04 HISTORY — DX: Prediabetes: R73.03

## 2012-03-04 HISTORY — DX: Panic disorder (episodic paroxysmal anxiety): F41.0

## 2012-03-04 LAB — SURGICAL PCR SCREEN
MRSA, PCR: NEGATIVE
Staphylococcus aureus: NEGATIVE

## 2012-03-04 LAB — BASIC METABOLIC PANEL
Calcium: 9.7 mg/dL (ref 8.4–10.5)
Creatinine, Ser: 0.96 mg/dL (ref 0.50–1.10)
GFR calc Af Amer: 77 mL/min — ABNORMAL LOW (ref >90)
Sodium: 139 mEq/L (ref 135–145)

## 2012-03-04 LAB — HEMOGLOBIN AND HEMATOCRIT, BLOOD: Hemoglobin: 13.1 g/dL (ref 12.0–15.0)

## 2012-03-04 NOTE — H&P (Signed)
  The most recent progress note is a history and physical from an office visit dated May 15  The plan is for arthroscopy left knee with partial medial meniscectomy

## 2012-03-08 ENCOUNTER — Ambulatory Visit (HOSPITAL_COMMUNITY): Payer: Managed Care, Other (non HMO) | Admitting: Anesthesiology

## 2012-03-08 ENCOUNTER — Ambulatory Visit (HOSPITAL_COMMUNITY)
Admission: RE | Admit: 2012-03-08 | Discharge: 2012-03-08 | Disposition: A | Discharge status: Home / Self Care | Payer: Managed Care, Other (non HMO) | Source: Ambulatory Visit | Attending: Orthopedic Surgery | Admitting: Orthopedic Surgery

## 2012-03-08 ENCOUNTER — Encounter (HOSPITAL_COMMUNITY): Payer: Self-pay | Admitting: *Deleted

## 2012-03-08 ENCOUNTER — Encounter (HOSPITAL_COMMUNITY)
Admission: RE | Disposition: A | Discharge status: Home / Self Care | Payer: Self-pay | Source: Ambulatory Visit | Attending: Orthopedic Surgery

## 2012-03-08 ENCOUNTER — Encounter (HOSPITAL_COMMUNITY): Payer: Self-pay | Admitting: Anesthesiology

## 2012-03-08 DIAGNOSIS — G4733 Obstructive sleep apnea (adult) (pediatric): Secondary | ICD-10-CM | POA: Insufficient documentation

## 2012-03-08 DIAGNOSIS — S83242A Other tear of medial meniscus, current injury, left knee, initial encounter: Secondary | ICD-10-CM

## 2012-03-08 DIAGNOSIS — I10 Essential (primary) hypertension: Secondary | ICD-10-CM | POA: Insufficient documentation

## 2012-03-08 DIAGNOSIS — Z01812 Encounter for preprocedural laboratory examination: Secondary | ICD-10-CM | POA: Insufficient documentation

## 2012-03-08 DIAGNOSIS — Z01810 Encounter for preprocedural cardiovascular examination: Secondary | ICD-10-CM | POA: Insufficient documentation

## 2012-03-08 DIAGNOSIS — IMO0002 Reserved for concepts with insufficient information to code with codable children: Secondary | ICD-10-CM

## 2012-03-08 DIAGNOSIS — E119 Type 2 diabetes mellitus without complications: Secondary | ICD-10-CM | POA: Insufficient documentation

## 2012-03-08 DIAGNOSIS — M23305 Other meniscus derangements, unspecified medial meniscus, unspecified knee: Secondary | ICD-10-CM | POA: Insufficient documentation

## 2012-03-08 DIAGNOSIS — M23329 Other meniscus derangements, posterior horn of medial meniscus, unspecified knee: Secondary | ICD-10-CM

## 2012-03-08 DIAGNOSIS — Z79899 Other long term (current) drug therapy: Secondary | ICD-10-CM | POA: Insufficient documentation

## 2012-03-08 LAB — GLUCOSE, CAPILLARY: Glucose-Capillary: 137 mg/dL — ABNORMAL HIGH (ref 70–99)

## 2012-03-08 SURGERY — ARTHROSCOPY, KNEE, WITH MEDIAL MENISCECTOMY
Anesthesia: Spinal | Site: Knee | Laterality: Left | Wound class: Clean

## 2012-03-08 MED ORDER — HYDROCODONE-ACETAMINOPHEN 10-325 MG PO TABS: 1.0000 | ORAL_TABLET | ORAL | Status: AC | PRN

## 2012-03-08 MED ORDER — LIDOCAINE IN DEXTROSE 5-7.5 % IV SOLN
INTRAVENOUS | Status: AC
  Filled 2012-03-08: qty 2

## 2012-03-08 MED ORDER — EPHEDRINE SULFATE 50 MG/ML IJ SOLN
INTRAMUSCULAR | Status: AC
  Filled 2012-03-08: qty 1

## 2012-03-08 MED ORDER — MIDAZOLAM HCL 2 MG/2ML IJ SOLN
1.0000 mg | INTRAMUSCULAR | Status: DC | PRN
  Administered 2012-03-08: 2 mg via INTRAVENOUS

## 2012-03-08 MED ORDER — MIDAZOLAM HCL 5 MG/5ML IJ SOLN
INTRAMUSCULAR | Status: DC | PRN
  Administered 2012-03-08: 2 mg via INTRAVENOUS

## 2012-03-08 MED ORDER — OXYCODONE HCL 5 MG PO TABS
5.0000 mg | ORAL_TABLET | ORAL | Status: DC
  Administered 2012-03-08: 5 mg via ORAL

## 2012-03-08 MED ORDER — EPHEDRINE 5 MG/ML INJ
5.0000 mg | Freq: Once | INTRAVENOUS | Status: AC
  Administered 2012-03-08: 5 mg via INTRAVENOUS

## 2012-03-08 MED ORDER — BUPIVACAINE-EPINEPHRINE PF 0.5-1:200000 % IJ SOLN
INTRAMUSCULAR | Status: DC | PRN
  Administered 2012-03-08 (×2): 30 mL

## 2012-03-08 MED ORDER — ACETAMINOPHEN 10 MG/ML IV SOLN
1000.0000 mg | Freq: Once | INTRAVENOUS | Status: AC
  Administered 2012-03-08: 1000 mg via INTRAVENOUS

## 2012-03-08 MED ORDER — CEFAZOLIN SODIUM-DEXTROSE 2-3 GM-% IV SOLR: 2.0000 g | INTRAVENOUS | Status: DC

## 2012-03-08 MED ORDER — ONDANSETRON HCL 4 MG/2ML IJ SOLN
INTRAMUSCULAR | Status: AC
  Administered 2012-03-08: 4 mg via INTRAVENOUS
  Filled 2012-03-08: qty 2

## 2012-03-08 MED ORDER — ONDANSETRON HCL 4 MG/2ML IJ SOLN: 4.0000 mg | Freq: Once | INTRAMUSCULAR | Status: DC | PRN

## 2012-03-08 MED ORDER — MIDAZOLAM HCL 2 MG/2ML IJ SOLN
INTRAMUSCULAR | Status: AC
  Filled 2012-03-08: qty 2

## 2012-03-08 MED ORDER — FENTANYL CITRATE 0.05 MG/ML IJ SOLN
25.0000 ug | INTRAMUSCULAR | Status: DC | PRN
  Administered 2012-03-08 (×2): 50 ug via INTRAVENOUS

## 2012-03-08 MED ORDER — OXYCODONE HCL 5 MG PO TABS
ORAL_TABLET | ORAL | Status: AC
  Administered 2012-03-08: 5 mg via ORAL
  Filled 2012-03-08: qty 1

## 2012-03-08 MED ORDER — FENTANYL CITRATE 0.05 MG/ML IJ SOLN
INTRAMUSCULAR | Status: AC
  Administered 2012-03-08: 50 ug via INTRAVENOUS
  Filled 2012-03-08: qty 2

## 2012-03-08 MED ORDER — SCOPOLAMINE 1 MG/3DAYS TD PT72
1.0000 | MEDICATED_PATCH | Freq: Once | TRANSDERMAL | Status: DC
  Administered 2012-03-08: 1.5 mg via TRANSDERMAL

## 2012-03-08 MED ORDER — CHLORHEXIDINE GLUCONATE 4 % EX LIQD
60.0000 mL | Freq: Once | CUTANEOUS | Status: DC
  Filled 2012-03-08: qty 60

## 2012-03-08 MED ORDER — SCOPOLAMINE 1 MG/3DAYS TD PT72
MEDICATED_PATCH | TRANSDERMAL | Status: AC
  Administered 2012-03-08: 1.5 mg via TRANSDERMAL
  Filled 2012-03-08: qty 1

## 2012-03-08 MED ORDER — ONDANSETRON HCL 4 MG/2ML IJ SOLN
4.0000 mg | Freq: Once | INTRAMUSCULAR | Status: AC
  Administered 2012-03-08: 4 mg via INTRAVENOUS

## 2012-03-08 MED ORDER — SODIUM CHLORIDE 0.9 % IR SOLN
Status: DC | PRN
  Administered 2012-03-08 (×4)

## 2012-03-08 MED ORDER — CEFAZOLIN SODIUM-DEXTROSE 2-3 GM-% IV SOLR
INTRAVENOUS | Status: AC
  Filled 2012-03-08: qty 50

## 2012-03-08 MED ORDER — CEFAZOLIN SODIUM 1-5 GM-% IV SOLN
INTRAVENOUS | Status: DC | PRN
  Administered 2012-03-08: 2 g via INTRAVENOUS

## 2012-03-08 MED ORDER — SODIUM CHLORIDE 0.9 % IR SOLN
Status: DC | PRN
  Administered 2012-03-08: 1000 mL

## 2012-03-08 MED ORDER — PROPOFOL 10 MG/ML IV EMUL
INTRAVENOUS | Status: AC
  Filled 2012-03-08: qty 20

## 2012-03-08 MED ORDER — BUPIVACAINE-EPINEPHRINE PF 0.5-1:200000 % IJ SOLN
INTRAMUSCULAR | Status: AC
  Filled 2012-03-08: qty 20

## 2012-03-08 MED ORDER — CELECOXIB 100 MG PO CAPS
ORAL_CAPSULE | ORAL | Status: AC
  Administered 2012-03-08: 400 mg via ORAL
  Filled 2012-03-08: qty 4

## 2012-03-08 MED ORDER — EPINEPHRINE HCL 1 MG/ML IJ SOLN
INTRAMUSCULAR | Status: AC
  Filled 2012-03-08: qty 5

## 2012-03-08 MED ORDER — DEXAMETHASONE SODIUM PHOSPHATE 4 MG/ML IJ SOLN
4.0000 mg | Freq: Once | INTRAMUSCULAR | Status: AC
  Administered 2012-03-08: 4 mg via INTRAVENOUS

## 2012-03-08 MED ORDER — ACETAMINOPHEN 10 MG/ML IV SOLN
INTRAVENOUS | Status: AC
  Administered 2012-03-08: 1000 mg via INTRAVENOUS
  Filled 2012-03-08: qty 100

## 2012-03-08 MED ORDER — PROPOFOL 10 MG/ML IV EMUL
INTRAVENOUS | Status: DC | PRN
  Administered 2012-03-08: 50 ug/kg/min via INTRAVENOUS

## 2012-03-08 MED ORDER — DEXAMETHASONE SODIUM PHOSPHATE 4 MG/ML IJ SOLN
INTRAMUSCULAR | Status: AC
  Administered 2012-03-08: 4 mg via INTRAVENOUS
  Filled 2012-03-08: qty 1

## 2012-03-08 MED ORDER — CELECOXIB 100 MG PO CAPS
400.0000 mg | ORAL_CAPSULE | Freq: Once | ORAL | Status: AC
  Administered 2012-03-08: 400 mg via ORAL

## 2012-03-08 MED ORDER — FENTANYL CITRATE 0.05 MG/ML IJ SOLN
INTRAMUSCULAR | Status: DC | PRN
  Administered 2012-03-08: 20 ug via INTRAVENOUS
  Administered 2012-03-08: 50 ug via INTRAVENOUS

## 2012-03-08 MED ORDER — LIDOCAINE IN DEXTROSE 5-7.5 % IV SOLN
INTRAVENOUS | Status: DC | PRN
  Administered 2012-03-08: 75 mg via INTRATHECAL

## 2012-03-08 MED ORDER — LACTATED RINGERS IV SOLN
INTRAVENOUS | Status: DC
  Administered 2012-03-08: 1000 mL via INTRAVENOUS

## 2012-03-08 SURGICAL SUPPLY — 54 items
ARTHROWAND PARAGON T2 (SURGICAL WAND)
BAG HAMPER (MISCELLANEOUS) ×2 IMPLANT
BANDAGE ELASTIC 6 VELCRO NS (GAUZE/BANDAGES/DRESSINGS) ×2 IMPLANT
BLADE AGGRESSIVE PLUS 4.0 (BLADE) ×2 IMPLANT
BLADE SURG SZ11 CARB STEEL (BLADE) ×2 IMPLANT
CHLORAPREP W/TINT 26ML (MISCELLANEOUS) ×4 IMPLANT
CLOTH BEACON ORANGE TIMEOUT ST (SAFETY) ×2 IMPLANT
COOLER CRYO IC GRAV AND TUBE (ORTHOPEDIC SUPPLIES) ×2 IMPLANT
CUFF CRYO KNEE LG 20X31 COOLER (ORTHOPEDIC SUPPLIES) ×2 IMPLANT
CUFF CRYO KNEE18X23 MED (MISCELLANEOUS) IMPLANT
CUFF TOURNIQUET SINGLE 34IN LL (TOURNIQUET CUFF) IMPLANT
CUFF TOURNIQUET SINGLE 44IN (TOURNIQUET CUFF) ×2 IMPLANT
CUTTER ANGLED DBL BITE 4.5 (BURR) IMPLANT
DECANTER SPIKE VIAL GLASS SM (MISCELLANEOUS) ×4 IMPLANT
ERX 2850 IMPLANT
FLOOR PAD 36X40 (MISCELLANEOUS) ×2
GAUZE SPONGE 4X4 16PLY XRAY LF (GAUZE/BANDAGES/DRESSINGS) ×2 IMPLANT
GAUZE XEROFORM 5X9 LF (GAUZE/BANDAGES/DRESSINGS) ×2 IMPLANT
GLOVE BIOGEL PI IND STRL 7.5 (GLOVE) ×1 IMPLANT
GLOVE BIOGEL PI INDICATOR 7.5 (GLOVE) ×1
GLOVE ECLIPSE 7.0 STRL STRAW (GLOVE) ×4 IMPLANT
GLOVE EXAM NITRILE MD LF STRL (GLOVE) ×2 IMPLANT
GLOVE SKINSENSE NS SZ8.0 LF (GLOVE) ×1
GLOVE SKINSENSE STRL SZ8.0 LF (GLOVE) ×1 IMPLANT
GLOVE SS N UNI LF 8.5 STRL (GLOVE) ×2 IMPLANT
GOWN STRL REIN XL XLG (GOWN DISPOSABLE) ×6 IMPLANT
HLDR LEG FOAM (MISCELLANEOUS) ×1 IMPLANT
IV NS IRRIG 3000ML ARTHROMATIC (IV SOLUTION) ×10 IMPLANT
KIT BLADEGUARD II DBL (SET/KITS/TRAYS/PACK) ×2 IMPLANT
KIT ROOM TURNOVER AP CYSTO (KITS) ×2 IMPLANT
LEG HOLDER FOAM (MISCELLANEOUS) ×1
MANIFOLD NEPTUNE II (INSTRUMENTS) ×2 IMPLANT
MARKER SKIN DUAL TIP RULER LAB (MISCELLANEOUS) ×2 IMPLANT
NEEDLE HYPO 18GX1.5 BLUNT FILL (NEEDLE) ×2 IMPLANT
NEEDLE HYPO 21X1.5 SAFETY (NEEDLE) ×2 IMPLANT
NEEDLE SPNL 18GX3.5 QUINCKE PK (NEEDLE) ×2 IMPLANT
NS IRRIG 1000ML POUR BTL (IV SOLUTION) ×4 IMPLANT
PACK ARTHRO LIMB DRAPE STRL (MISCELLANEOUS) ×2 IMPLANT
PAD ABD 5X9 TENDERSORB (GAUZE/BANDAGES/DRESSINGS) ×2 IMPLANT
PAD ARMBOARD 7.5X6 YLW CONV (MISCELLANEOUS) ×2 IMPLANT
PAD FLOOR 36X40 (MISCELLANEOUS) ×1 IMPLANT
PADDING CAST COTTON 6X4 STRL (CAST SUPPLIES) ×2 IMPLANT
SET ARTHROSCOPY INST (INSTRUMENTS) ×2 IMPLANT
SET ARTHROSCOPY PUMP TUBE (IRRIGATION / IRRIGATOR) ×2 IMPLANT
SET BASIN LINEN APH (SET/KITS/TRAYS/PACK) ×2 IMPLANT
SPONGE GAUZE 4X4 12PLY (GAUZE/BANDAGES/DRESSINGS) ×2 IMPLANT
STRIP CLOSURE SKIN 1/2X4 (GAUZE/BANDAGES/DRESSINGS) IMPLANT
SUT ETHILON 3 0 FSL (SUTURE) ×2 IMPLANT
SYR 30ML LL (SYRINGE) ×2 IMPLANT
SYRINGE 10CC LL (SYRINGE) ×2 IMPLANT
WAND 50 DEG COVAC W/CORD (SURGICAL WAND) ×2 IMPLANT
WAND 90 DEG TURBOVAC W/CORD (SURGICAL WAND) IMPLANT
WAND ARTHRO PARAGON T2 (SURGICAL WAND) IMPLANT
YANKAUER SUCT BULB TIP 10FT TU (MISCELLANEOUS) ×8 IMPLANT

## 2012-03-08 NOTE — Telephone Encounter (Signed)
Same day followed up with patient. She is working Armed forces logistics/support/administrative officer at [employer], [employer].  [employer] as noted.

## 2012-03-08 NOTE — Addendum Note (Signed)
Addendum  created 03/08/12 1010 by Moshe Salisbury, CRNA   Modules edited:Anesthesia Events, Anesthesia Medication Administration

## 2012-03-08 NOTE — Addendum Note (Signed)
Addendum  created 03/08/12 1010 by Delon Revelo E Maizey Menendez, CRNA   Modules edited:Anesthesia Events, Anesthesia Medication Administration    

## 2012-03-08 NOTE — Op Note (Signed)
Preop diagnosis osteoarthritis torn medial meniscus left knee  Postop diagnosis same  Procedure arthroscopy left knee partial medial meniscectomy  Surgeon Romeo Apple  Anesthesia spinal anesthetic  Operative findings complex tear posterior horn medial meniscus, severe degenertive changes in the patellofemoral joint grade 3 medial compartment grade 2 lateral compartment grade 1   Indications for procedure pain mechanical symptoms unresponsive to nonoperative treatment  The patient was identified in the preop holding area as Gevena Cotton and the surgical site was confirmed and marked The patient was taken to the operating room he was given appropriate preoperative antibiotic and general anesthesia was administered. The left leg was placed in the arthroscopic leg holder, the right leg was placed in a well leg holder  The right leg was then prepped and draped  The surgical site was confirmed and the timeout procedure was completed  The lateral portal was injected with Marcaine with epinephrine solution and a stab wound was made. The scope was placed in the lateral portal into the medial compartment. A diagnostic arthroscopy was completed Vytorin the knee. A medial portal was established in the same fashion and a probe was placed into the joint. A diagnostic arthroscopy to be was repeated using a probe to palpate intra-articular structures   There was a tear of the posterior horn of the medial meniscus it was complex with multiple tears.  The notch showed sinonasal thickening The lateral compartment was abnormal with grade 1 chondral changes The patellofemoral joint was abnormal with grade 3  chondral changes The meniscus was resected with an ArthroCare wand 50 probe A probe was then used to confirm a stable rim.  The knee was irrigated meniscal fragments that were remaining were removed. The portals were closed with 3-0 nylon suture to on the lateral side one on the medial side. The knee  joint was then injected with 60 cc of Marcaine with epinephrine. A sterile dressing was applied followed by an Ace bandage and a Cryo/Cuff.  The patient was  taken to the recovery room in stable condition.

## 2012-03-08 NOTE — Interval H&P Note (Signed)
History and Physical Interval Note:  03/08/2012 7:20 AM  Rhonda Patton  has presented today for surgery, with the diagnosis of torn medial meniscus and osteoarthritis left knee  The various methods of treatment have been discussed with the patient and family. After consideration of risks, benefits and other options for treatment, the patient has consented to  Procedure(s) (LRB): KNEE ARTHROSCOPY WITH MEDIAL MENISECTOMY (Left) as a surgical intervention .  The patients' history has been reviewed, patient examined, no change in status, stable for surgery.  I have reviewed the patients' chart and labs.  Questions were answered to the patient's satisfaction.     Fuller Canada

## 2012-03-08 NOTE — Anesthesia Postprocedure Evaluation (Signed)
  Anesthesia Post-op Note  Patient: Rhonda Patton  Procedure(s) Performed: Procedure(s) (LRB): KNEE ARTHROSCOPY WITH MEDIAL MENISECTOMY (Left)  Patient Location: PACU  Anesthesia Type: Spinal  Level of Consciousness: awake, alert  and oriented  Airway and Oxygen Therapy: Patient Spontanous Breathing and Patient connected to face mask oxygen  Post-op Pain: none  Post-op Assessment: Post-op Vital signs reviewed, Patient's Cardiovascular Status Stable, Respiratory Function Stable and Patent Airway  Post-op Vital Signs: Reviewed and stable  Complications: No apparent anesthesia complications

## 2012-03-08 NOTE — Transfer of Care (Signed)
Immediate Anesthesia Transfer of Care Note  Patient: Rhonda Patton  Procedure(s) Performed: Procedure(s) (LRB): KNEE ARTHROSCOPY WITH MEDIAL MENISECTOMY (Left)  Patient Location: PACU  Anesthesia Type: Spinal  Level of Consciousness: awake, alert  and oriented  Airway & Oxygen Therapy: Patient Spontanous Breathing and Patient connected to nasal cannula oxygen  Post-op Assessment: Report given to PACU RN  Post vital signs: Reviewed and stable  Complications: No apparent anesthesia complications

## 2012-03-08 NOTE — Brief Op Note (Signed)
03/08/2012  8:51 AM  PATIENT:  Rhonda Patton  51 y.o. female  PRE-OPERATIVE DIAGNOSIS:  torn medial meniscus and osteoarthritis left knee  POST-OPERATIVE DIAGNOSIS:  torn medial meniscus and osteoarthritis left knee  PROCEDURE:  Procedure(s) (LRB): KNEE ARTHROSCOPY WITH MEDIAL MENISECTOMY (Left)  SURGEON:  Surgeon(s) and Role:    * Vickki Hearing, MD - Primary  PHYSICIAN ASSISTANT:   ASSISTANTS: none   ANESTHESIA:   spinal  EBL:  Total I/O In: 100 [I.V.:100] Out: 0   BLOOD ADMINISTERED:none  DRAINS: none   LOCAL MEDICATIONS USED:  MARCAINE   , Amount: 60 ml and OTHER epi   SPECIMEN:  No Specimen  DISPOSITION OF SPECIMEN:  N/A  COUNTS:  YES  TOURNIQUET:  * Missing tourniquet times found for documented tourniquets in log:  39710 *  DICTATION: .Dragon Dictation  PLAN OF CARE: Discharge to home after PACU  PATIENT DISPOSITION:  PACU - hemodynamically stable.   Delay start of Pharmacological VTE agent (>24hrs) due to surgical blood loss or risk of bleeding: not applicable

## 2012-03-08 NOTE — H&P (Signed)
  Rhonda Patton is a 50 y.o. female who presents gradual onset of left knee pain since March of this year. The patient reports sharp and stabbing 9/10 constant knee pain with locking catching and swelling. Her pain is increased when she's standing on it and it is relieved by no measures to this point. There was no actual trauma. The symptoms have worsened.   The following portions of the patient's history were reviewed and updated as appropriate: allergies, current medications, past family history, past medical history, past social history, past surgical history and problem list.   The review of systems is notable for weight gain blurred vision chest pain shortness of breath tightness in the chest heartburn numbness and tingling unrelated to this injury, anxiety, seasonal allergies and painful urination which is intermittent   Review of Systems  Pertinent items are noted in HPI.    Objective:   BP 102/72  Ht 5\' 3"  (1.6 m)  Wt 248 lb (112.492 kg)  BMI 43.93 kg/m2  Vital signs are stable as recorded   General appearance is normal   The patient is alert and oriented x3   The patient's mood and affect are normal   Gait assessment: She is ambulating with assistive device and she is limping favoring the left lower extremity   The cardiovascular exam reveals normal pulses and temperature without edema swelling.   The lymphatic system is negative for palpable lymph nodes   The sensory exam is normal.   There are no pathologic reflexes.   Balance is normal.   Upper extremity exam  Inspection and palpation revealed no abnormalities in the upper extremities. Range of motion is full without contracture.  Motor exam is normal with grade 5 strength.  The joints are fully reduced without subluxation.  There is no atrophy or tremor and muscle tone is normal. All joints are stable.   The right lower extremity has no swelling, range of motion is normal, strength is normal, stability is  normal, skin is normal.   Exam of the left knee  Inspection there is a joint effusion there is tenderness in the medial joint line  Range of motion limited to 110  Stability normal ligaments  Strength normal  Skin normal             X-ray the x-ray does not show any acute trauma      Assessment:     Left Severe meniscal injury on the left      Plan:     The patient was given the choice of nonoperative care, MRI or surgical evaluation. She wishes to proceed with arthroscopic left  knee surgery and partial medial meniscectomy

## 2012-03-08 NOTE — Anesthesia Procedure Notes (Signed)
Spinal  Patient location during procedure: OR Start time: 03/08/2012 7:57 AM Staffing CRNA/Resident: Glynn Octave E Preanesthetic Checklist Completed: patient identified, site marked, surgical consent, pre-op evaluation, timeout performed, IV checked, risks and benefits discussed and monitors and equipment checked Spinal Block Patient position: left lateral decubitus Prep: Betadine Patient monitoring: heart rate, cardiac monitor, continuous pulse ox and blood pressure Approach: left paramedian Location: L3-4 Injection technique: single-shot Needle Needle type: Spinocan  Needle gauge: 22 G Needle length: 9 cm Assessment Sensory level: T10 Additional Notes  ATTEMPTS:1 TRAY ZO:[id number] TRAY EXPIRATION DATE:08/2012

## 2012-03-08 NOTE — Interval H&P Note (Signed)
History and Physical Interval Note:  03/08/2012 7:16 AM  Rhonda Patton  has presented today for surgery, with the diagnosis of torn medial meniscus and osteoarthritis left knee  The various methods of treatment have been discussed with the patient and family. After consideration of risks, benefits and other options for treatment, the patient has consented to  Procedure(s) (LRB): KNEE ARTHROSCOPY WITH MEDIAL MENISECTOMY (Left) as a surgical intervention .  The patients' history has been reviewed, patient examined, no change in status, stable for surgery.  I have reviewed the patients' chart and labs.  Questions were answered to the patient's satisfaction.     Fuller Canada

## 2012-03-08 NOTE — Anesthesia Preprocedure Evaluation (Signed)
Anesthesia Evaluation  Patient identified by MRN, date of birth, ID band Patient awake    Reviewed: Allergy & Precautions, H&P , NPO status , Patient's Chart, lab work & pertinent test results  History of Anesthesia Complications (+) PONV  Airway Mallampati: I      Dental  (+) Teeth Intact   Pulmonary shortness of breath and with exertion, sleep apnea ,  breath sounds clear to auscultation        Cardiovascular hypertension, Pt. on medications + angina + Valvular Problems/Murmurs AI and MR Rhythm:Regular Rate:Normal     Neuro/Psych Anxiety  Neuromuscular disease    GI/Hepatic GERD-  Medicated and Controlled,  Endo/Other  Diabetes mellitus-, Well Controlled, Type 2  Renal/GU      Musculoskeletal   Abdominal   Peds  Hematology   Anesthesia Other Findings   Reproductive/Obstetrics                           Anesthesia Physical Anesthesia Plan  ASA: III  Anesthesia Plan: Spinal   Post-op Pain Management:    Induction:   Airway Management Planned: Nasal Cannula  Additional Equipment:   Intra-op Plan:   Post-operative Plan:   Informed Consent: I have reviewed the patients History and Physical, chart, labs and discussed the procedure including the risks, benefits and alternatives for the proposed anesthesia with the patient or authorized representative who has indicated his/her understanding and acceptance.     Plan Discussed with:   Anesthesia Plan Comments:         Anesthesia Quick Evaluation

## 2012-03-09 ENCOUNTER — Ambulatory Visit: Payer: Self-pay | Admitting: Orthopedic Surgery

## 2012-03-09 MED FILL — Epinephrine HCl Inj 1 MG/ML: INTRAMUSCULAR | Qty: 1 | Status: AC

## 2012-03-10 ENCOUNTER — Encounter: Payer: Self-pay | Admitting: Orthopedic Surgery

## 2012-03-10 ENCOUNTER — Ambulatory Visit (INDEPENDENT_AMBULATORY_CARE_PROVIDER_SITE_OTHER): Payer: Managed Care, Other (non HMO) | Admitting: Orthopedic Surgery

## 2012-03-10 VITALS — BP 104/60 | Ht 63.0 in | Wt 248.0 lb

## 2012-03-10 DIAGNOSIS — Z9889 Other specified postprocedural states: Secondary | ICD-10-CM

## 2012-03-10 NOTE — Progress Notes (Signed)
Patient ID: Rhonda Patton, female   DOB: March 25, 1961, 51 y.o.   MRN: [id number] Chief Complaint  Patient presents with  . Routine Post Op    post op 1 Lt knee, DOS 03/08/12    BP 104/60  Ht 5\' 3"  (1.6 m)  Wt 248 lb (112.492 kg)  BMI 43.93 kg/m2  Status post LEFT knee arthroscopy and partial medial meniscectomy.  The patient complains that she has increased pain and swelling last night. The only new thing is that she is walking on it more than usual.  Small joint effusion. Not more than expected. No calf tenderness. Negative Homans. No peripheral edema. Incisions are clean.  Followup 3 weeks start physical therapy on Monday

## 2012-03-10 NOTE — Patient Instructions (Signed)
Aet up therapy at [facility]   Continue ice as needed

## 2012-03-11 ENCOUNTER — Telehealth: Payer: Self-pay | Admitting: Orthopedic Surgery

## 2012-03-11 NOTE — Telephone Encounter (Signed)
now

## 2012-03-11 NOTE — Telephone Encounter (Signed)
Rhonda Patton asked when can she shower?

## 2012-03-11 NOTE — Telephone Encounter (Signed)
Relayed doctor's message to patient.

## 2012-03-17 ENCOUNTER — Ambulatory Visit (HOSPITAL_COMMUNITY)
Admission: RE | Admit: 2012-03-17 | Discharge: 2012-03-17 | Disposition: A | Discharge status: Home / Self Care | Payer: Managed Care, Other (non HMO) | Source: Ambulatory Visit | Attending: Orthopedic Surgery | Admitting: Orthopedic Surgery

## 2012-03-17 DIAGNOSIS — R29898 Other symptoms and signs involving the musculoskeletal system: Secondary | ICD-10-CM | POA: Insufficient documentation

## 2012-03-17 DIAGNOSIS — M25569 Pain in unspecified knee: Secondary | ICD-10-CM | POA: Insufficient documentation

## 2012-03-17 DIAGNOSIS — M6281 Muscle weakness (generalized): Secondary | ICD-10-CM | POA: Insufficient documentation

## 2012-03-17 DIAGNOSIS — IMO0001 Reserved for inherently not codable concepts without codable children: Secondary | ICD-10-CM | POA: Insufficient documentation

## 2012-03-17 DIAGNOSIS — R262 Difficulty in walking, not elsewhere classified: Secondary | ICD-10-CM | POA: Insufficient documentation

## 2012-03-17 DIAGNOSIS — M25662 Stiffness of left knee, not elsewhere classified: Secondary | ICD-10-CM | POA: Insufficient documentation

## 2012-03-17 NOTE — Evaluation (Signed)
Physical Therapy Evaluation  Patient Details  Name: Rhonda Patton MRN: [id number] Date of Birth: 04/06/1961  Today's Date: 03/17/2012 Time: 7829-5621 PT Time Calculation (min): 42 min  Visit#: 1  of 9   Re-eval: 04/16/12 Assessment Diagnosis: torn medial meniscus Surgical Date: 03/08/12 Next MD Visit: 03/31/12 Prior Therapy: none   Past Medical History:  Past Medical History  Diagnosis Date  . Hypertension   . Hypercholesteremia   . Angina     occasional  . Sleep apnea     diagnosed but doesn't use CPAP  . Borderline diabetes   . GERD (gastroesophageal reflux disease)   . Anxiety   . Panic attack   . Arthritis   . Carpal tunnel syndrome of right wrist   . PONV (postoperative nausea and vomiting)    Past Surgical History:  Past Surgical History  Procedure Date  . Cesarean section     x 3  . Cholecystectomy   . Hand surgery     x 2  . Foot surgery     x 2  . Partial hysterectomy 2001    total abdominal  . Abdominal hysterectomy     Subjective Symptoms/Limitations Symptoms: Rhonda Patton states that her knee started bothering her about two months ago.  She had arthroscopic surgery on 03/08/2012.  She states that she is having difficulty  getting out of bed,  out of a chair , and out of a car.  She is being referred to PT to improve her functinal mobility. How long can you sit comfortably?: She is able to sit for fifteen minutes before she wants to move. How long can you stand comfortably?: She is able to stand for about twenty minutes before she needs to sit down. How long can you walk comfortably?: She is currently walking with cane.  The longest she has walked at one time is less five minutes. Special Tests: She is having trouble sleeping and is waking at least four times a day. 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  ) Leisure: Hobbies-yes (Comment) Comments: walk    Assessment LLE AROM (degrees) Left Knee Extension 0-130: 0  Left Knee Flexion 0-140: 103  LLE Strength Left Hip Flexion: 3-/5 Left Hip Extension: 3-/5 Left Hip ABduction: 3/5 Left Hip ADduction: 3+/5 Left Knee Flexion: 3/5 Left Knee Extension: 3+/5 Left Ankle Dorsiflexion: 3+/5  Exercise/Treatments Mobility/Balance    Pt gait trained with cane.      Seated Long Arc Quad: 5 sets Supine Quad Sets: 5 reps Heel Slides: 5 reps Sidelying Hip ABduction: 5 reps Prone  Hamstring Curl: 5 reps Hip Extension: 5 reps    Physical Therapy Assessment and Plan PT Assessment and Plan Clinical Impression Statement: Pt will benefit from skilled physical therapy to normalize walking decrease pain and improve mobility.  Gilmer Mor was adjusted for patient and gt trained showing pt how to use the cane correctly as well as instructed in HEP Pt will benefit from skilled therapeutic intervention in order to improve  on the following deficits: Decreased mobility;Decreased range of motion;Decreased strength;Difficulty walking Rehab Potential: Good PT Frequency: Min 2X/week PT Duration: 4 weeks PT Treatment/Interventions: DME instruction;Therapeutic activities;Therapeutic exercise PT Plan: See two times a week for strengthening.  Begin bike,rocker board, heel raise, lateral step up, forward step up, prone quad stretches next treatment.    Goals Home Exercise Program Pt will Perform Home Exercise Program: Independently PT Short Term Goals Time to Complete Short  Term Goals: 2 weeks PT Short Term Goal 1: Pt to ambulate in her house without the cane PT Short Term Goal 2: Pt to be able to ambulate for over 30 minutes without pain PT Short Term Goal 3: Pt ROM wnl to allow pt to be able to squat to pick up items off the floor PT Short Term Goal 4: Pt to be able to stand for 30 minute to make a meal PT Long Term Goals Time to Complete Long Term Goals: 4 weeks PT Long Term Goal 1: Pain no greater than a 2 with full day activity. PT Long Term Goal 2: Pt to be able to sit for 2 hours for car traveling and watching movies Long Term Goal 3: Pt to be able to walk for two hours for shopping Long Term Goal 4: Pt strength to improve one grade to allow the above to occur. PT Long Term Goal 5: Pt to be walkng outside with no assistive device.  Problem List Patient Active Problem List  Diagnoses  . CARPAL TUNNEL SYNDROME  . MITRAL REGURGITATION  . AORTIC INSUFFICIENCY  . SHORTNESS OF BREATH  . CHEST PAIN-UNSPECIFIED  . Acute medial meniscus tear of left knee  . S/P arthroscopy of left knee  . Left leg weakness  . Difficulty in walking  . Knee stiffness, left    PT - End of Session Activity Tolerance: Patient tolerated treatment well General Behavior During Session: [facility] for tasks performed Cognition: [facility] for tasks performed PT Plan of Care PT Home Exercise Plan: given    Rhonda Patton,Rhonda Patton 03/17/2012, 5:10 PM  Physician Documentation Your signature is required to indicate approval of the treatment plan as stated above.  Please sign and either send electronically or make a copy of this report for your files and return this physician signed original.   Please mark one 1.__approve of plan  2. ___approve of plan with the following conditions.   ______________________________                                                          _____________________ Physician Signature                                                                                                              Date

## 2012-03-22 ENCOUNTER — Ambulatory Visit (HOSPITAL_COMMUNITY)
Admission: RE | Admit: 2012-03-22 | Discharge: 2012-03-22 | Disposition: A | Discharge status: Home / Self Care | Payer: Managed Care, Other (non HMO) | Source: Ambulatory Visit | Attending: Family Medicine | Admitting: Family Medicine

## 2012-03-22 DIAGNOSIS — R262 Difficulty in walking, not elsewhere classified: Secondary | ICD-10-CM | POA: Insufficient documentation

## 2012-03-22 DIAGNOSIS — M25569 Pain in unspecified knee: Secondary | ICD-10-CM | POA: Insufficient documentation

## 2012-03-22 DIAGNOSIS — M6281 Muscle weakness (generalized): Secondary | ICD-10-CM | POA: Insufficient documentation

## 2012-03-22 DIAGNOSIS — IMO0001 Reserved for inherently not codable concepts without codable children: Secondary | ICD-10-CM | POA: Insufficient documentation

## 2012-03-22 NOTE — Progress Notes (Signed)
Physical Therapy Treatment Patient Details  Name: Rhonda Patton MRN: [id number] Date of Birth: Jun 14, 1961  Today's Date: 03/22/2012 Time: 7829-5621 PT Time Calculation (min): 33 min Visit#: 2  of 9   Re-eval: 04/16/12 Charges:  therex 26'    Subjective: Symptoms/Limitations Symptoms: Pt. states she's only having a little pain, 3/10.  States she is mostly "carrying" her cane now Pain Assessment Currently in Pain?: Yes Pain Score:   3 Pain Location: Knee Pain Orientation: Left;Anterior    Exercise/Treatments Stretches Quad Stretch: 3 reps;30 seconds;Limitations Lobbyist Limitations: Prone Aerobic Stationary Bike: 6' @ 2.0 Standing Heel Raises: 10 reps;Limitations Heel Raises Limitations: toeraises 10 reps Lateral Step Up: 10 reps;Step Height: 4";Hand Hold: 1 Forward Step Up: 10 reps;Step Height: 4";Hand Hold: 1 Rocker Board: 2 minutes Seated Long Arc Quad: 10 reps Supine Quad Sets: 10 reps;Limitations Quad Sets Limitations: HEP Heel Slides: 10 reps;Limitations Heel Slides Limitations: HEP Straight Leg Raises: 10 reps Sidelying Hip ABduction: 10 reps Prone  Hamstring Curl: 10 reps Hip Extension: 10 reps    Modalities: Offered ice at end of session, however pt. Declined stating she would ice at home.  Physical Therapy Assessment and Plan PT Assessment and Plan Clinical Impression Statement: Added new activities/therex without difficulty or pain.  Pt. able to ambulate without SPC with only slight antalgia.  Pt. would like to be able to negotiate steps at church this sunday; Told will work on this next visit if pain is not up/sore from previous visit. PT Plan: Continue to progress balance and strength.  Add stair negotiation, lunges and squats next visit.     Problem List Patient Active Problem List  Diagnoses  . CARPAL TUNNEL SYNDROME  . MITRAL REGURGITATION  . AORTIC INSUFFICIENCY  . SHORTNESS OF BREATH  . CHEST PAIN-UNSPECIFIED  . Acute medial  meniscus tear of left knee  . S/P arthroscopy of left knee  . Left leg weakness  . Difficulty in walking  . Knee stiffness, left    PT - End of Session Activity Tolerance: Patient tolerated treatment well General Behavior During Session: [facility] for tasks performed Cognition: [facility] for tasks performed   Lurena Nida, PTA/CLT 03/22/2012, 3:57 PM

## 2012-03-25 ENCOUNTER — Ambulatory Visit (HOSPITAL_COMMUNITY)
Admission: RE | Admit: 2012-03-25 | Discharge: 2012-03-25 | Disposition: A | Discharge status: Home / Self Care | Payer: Managed Care, Other (non HMO) | Source: Ambulatory Visit | Attending: Family Medicine | Admitting: Family Medicine

## 2012-03-25 NOTE — Progress Notes (Signed)
Physical Therapy Treatment Patient Details  Name: Rhonda Patton MRN: [id number] Date of Birth: 1961-08-12  Today's Date: 03/25/2012 Time: 1512-1600 PT Time Calculation (min): 48 min  Visit#: 3  of 9   Re-eval: 04/16/12 Charges: Therex x 40'   Subjective: Symptoms/Limitations Symptoms: The rain increases my pain. Pain Assessment Currently in Pain?: Yes Pain Score:   3 Pain Location: Knee Pain Orientation: Left;Anterior   Exercise/Treatments Stretches Lobbyist: 3 reps;30 seconds;Limitations Lobbyist Limitations: Prone Aerobic Stationary Bike: 6' @ 2.0 Standing Heel Raises: 15 reps Heel Raises Limitations: toeraises 15 reps Forward Lunges: 10 reps Lateral Step Up: 10 reps;Step Height: 4";Hand Hold: 1 Forward Step Up: 10 reps;Step Height: 4";Hand Hold: 1 Functional Squat: 10 reps Stairs: 2RT recip Rocker Board: 2 minutes SLS: L:15" max of 3 Sidelying Hip ABduction: 15 reps Prone  Hamstring Curl: 10 reps;Limitations Hamstring Curl Limitations: 5# Hip Extension: 15 reps  Physical Therapy Assessment and Plan PT Assessment and Plan Clinical Impression Statement: Began functional squats and lunges with multimodal cueing for proper form. Pt completes all other therex with good form and minimal need for cueing. Began stair training with reciprocal pattern ascending and descending. Pt is without complaint throughout session. PT Plan: Continue to progress balance and strength per PT POC.     Problem List Patient Active Problem List  Diagnoses  . CARPAL TUNNEL SYNDROME  . MITRAL REGURGITATION  . AORTIC INSUFFICIENCY  . SHORTNESS OF BREATH  . CHEST PAIN-UNSPECIFIED  . Acute medial meniscus tear of left knee  . S/P arthroscopy of left knee  . Left leg weakness  . Difficulty in walking  . Knee stiffness, left    PT - End of Session Activity Tolerance: Patient tolerated treatment well General Behavior During Session: [facility] for tasks performed Cognition:  [facility] for tasks performed   Seth Bake, PTA 03/25/2012, 5:05 PM

## 2012-03-29 ENCOUNTER — Ambulatory Visit (HOSPITAL_COMMUNITY)
Admission: RE | Admit: 2012-03-29 | Discharge: 2012-03-29 | Disposition: A | Discharge status: Home / Self Care | Payer: Managed Care, Other (non HMO) | Source: Ambulatory Visit | Attending: Family Medicine | Admitting: Family Medicine

## 2012-03-29 DIAGNOSIS — R29898 Other symptoms and signs involving the musculoskeletal system: Secondary | ICD-10-CM

## 2012-03-29 DIAGNOSIS — R262 Difficulty in walking, not elsewhere classified: Secondary | ICD-10-CM

## 2012-03-29 NOTE — Progress Notes (Signed)
Physical Therapy Treatment Patient Details  Name: Rhonda Patton MRN: [id number] Date of Birth: July 05, 1961  Today's Date: 03/29/2012 Time: 4098-1191 PT Time Calculation (min): 50 min  Visit#: 4  of 9   Re-eval: 04/16/12  Charge: therex 42 min  Subjective: Symptoms/Limitations Symptoms: Rain increases my knee pain, maybe a 1-2/10 today.  Pt stated most difficulty with stairs and getting knee to bend. Pain Assessment Currently in Pain?: Yes Pain Score:   2 Pain Location: Knee Pain Orientation: Left  Objective:   Exercise/Treatments Stretches Quad Stretch: 3 reps;30 seconds;Limitations Quad Stretch Limitations: Prone with rope Knee: Self-Stretch to increase Flexion: 3 reps;30 seconds;Limitations Knee: Self-Stretch Limitations: chair stretch Aerobic Stationary Bike: 8' @ 3.0 for increased activity tolerance Machines for Strengthening Cybex Knee Extension: 2 PL 10 reps Cybex Knee Flexion: 4Pl 10 reps Standing Heel Raises: 20 reps;Limitations Heel Raises Limitations: toe raises 20 reps Lateral Step Up: 15 reps;Hand Hold: 1;Step Height: 4" Forward Step Up: 15 reps;Hand Hold: 0;Step Height: 4" Step Down: 10 reps;Hand Hold: 1;Step Height: 4" Functional Squat: 15 reps;Limitations Functional Squat Limitations: proper lifting off 6in box Stairs: 2RT recip Rocker Board: 2 minutes SLS: L 23" max of 3 Sidelying Hip ABduction: Limitations Hip ABduction Limitations: time Prone  Hamstring Curl: Limitations Hamstring Curl Limitations: cybex machine Hip Extension: 15 reps;Limitations Hip Extension Limitations: 3#      Physical Therapy Assessment and Plan PT Assessment and Plan Clinical Impression Statement: Pt progressing well towards total therex.  Pt stated most difficulty with bending knee, added chair stretch on chair to address limitation without difficulty following demonstration.  Began step down with 4in step with good form but noted weak quad eccentric control  descending stairs.  Min cueing for correct sequencing with reciprocal pattern in hosptial stairwell.  Quad and hamstring strength improving, able to progress to cybex machines without difficulty.  Pt with no c/o during total session. PT Plan: Continue to progress balance and strength per PT POC    Goals    Problem List Patient Active Problem List  Diagnoses  . CARPAL TUNNEL SYNDROME  . MITRAL REGURGITATION  . AORTIC INSUFFICIENCY  . SHORTNESS OF BREATH  . CHEST PAIN-UNSPECIFIED  . Acute medial meniscus tear of left knee  . S/P arthroscopy of left knee  . Left leg weakness  . Difficulty in walking  . Knee stiffness, left    PT - End of Session Activity Tolerance: Patient tolerated treatment well General Behavior During Session: [facility] for tasks performed Cognition: [facility] for tasks performed  GP No functional reporting required  Juel Burrow, PTA 03/29/2012, 4:04 PM

## 2012-03-31 ENCOUNTER — Ambulatory Visit (INDEPENDENT_AMBULATORY_CARE_PROVIDER_SITE_OTHER): Payer: Managed Care, Other (non HMO) | Admitting: Orthopedic Surgery

## 2012-03-31 ENCOUNTER — Encounter: Payer: Self-pay | Admitting: Orthopedic Surgery

## 2012-03-31 VITALS — BP 100/64 | Ht 63.0 in | Wt 248.0 lb

## 2012-03-31 DIAGNOSIS — Z9889 Other specified postprocedural states: Secondary | ICD-10-CM

## 2012-03-31 NOTE — Progress Notes (Signed)
Patient ID: Rhonda Patton, female   DOB: 1961-02-25, 51 y.o.   MRN: [id number] Postop LEFT knee scope, severe arthritis, torn meniscus.  Therapy at the hospital.  Progressing well and living without a cane with a slight limp. Her range of motion has returned to normal. She has good extension. She should continue exercise program. Followup, June 27 to determine a return to work date

## 2012-03-31 NOTE — Patient Instructions (Addendum)
Finish PT Return June 27

## 2012-04-01 ENCOUNTER — Ambulatory Visit (HOSPITAL_COMMUNITY)
Admission: RE | Admit: 2012-04-01 | Discharge: 2012-04-01 | Disposition: A | Discharge status: Home / Self Care | Payer: Managed Care, Other (non HMO) | Source: Ambulatory Visit | Attending: Family Medicine | Admitting: Family Medicine

## 2012-04-01 NOTE — Progress Notes (Signed)
Physical Therapy Treatment Patient Details  Name: Rhonda Patton MRN: [id number] Date of Birth: [birth year]  Today's Date: 04/01/2012 Time: 7829-5621 PT Time Calculation (min): 39 min  Visit#: 5  of 9   Re-eval: 04/16/12 Charges: Therex x 38'   Subjective: Symptoms/Limitations Symptoms: Pt states MD believes rec bike is causing the pain in posterior knee. Pain Assessment Currently in Pain?: Yes Pain Score:   1 Pain Location: Knee Pain Orientation: Left   Exercise/Treatments Stretches Quad Stretch: 3 reps;30 seconds;Limitations Quad Stretch Limitations: Prone with rope Knee: Self-Stretch to increase Flexion: 3 reps;30 seconds;Limitations Knee: Self-Stretch Limitations: chair stretch Aerobic Stationary Bike: D/C per MD Machines for Strengthening Cybex Knee Extension: 2.5 PL 10 reps Cybex Knee Flexion: 4.5Pl 15 reps Standing Lateral Step Up: 15 reps;Hand Hold: 1;Step Height: 4" Forward Step Up: 15 reps;Hand Hold: 0;Step Height: 4" Step Down: 15 reps;Hand Hold: 1;Step Height: 4" Functional Squat: 15 reps;Limitations Functional Squat Limitations: proper lifting off 6in box Wall Squat: 10 reps;5 seconds Stairs: 2RT recip Rocker Board: 2 minutes SLS: L 20" max of 3 Sidelying Hip ABduction: 15 reps Hip ABduction Limitations: 3# Prone  Hip Extension: 15 reps;Limitations Hip Extension Limitations: 3#    Physical Therapy Assessment and Plan PT Assessment and Plan Clinical Impression Statement: Rec bike d/c'd per MD order. Pt completes all therex well with minimal need for cueing. Began wall squats to improve quad strength without increased pain or difficulty. Pt reports no increase in pain at end of session. PT Plan: Continue to progress LE strength and balance per PT POC.     Problem List Patient Active Problem List  Diagnosis  . CARPAL TUNNEL SYNDROME  . MITRAL REGURGITATION  . AORTIC INSUFFICIENCY  . SHORTNESS OF BREATH  . CHEST PAIN-UNSPECIFIED  . Acute  medial meniscus tear of left knee  . S/P arthroscopy of left knee  . Left leg weakness  . Difficulty in walking  . Knee stiffness, left    PT - End of Session Activity Tolerance: Patient tolerated treatment well General Behavior During Session: [facility] for tasks performed Cognition: [facility] for tasks performed   Seth Bake, PTA 04/01/2012, 3:50 PM

## 2012-04-05 ENCOUNTER — Ambulatory Visit (HOSPITAL_COMMUNITY): Payer: Managed Care, Other (non HMO) | Admitting: *Deleted

## 2012-04-05 ENCOUNTER — Telehealth (HOSPITAL_COMMUNITY): Payer: Self-pay

## 2012-04-08 ENCOUNTER — Ambulatory Visit (HOSPITAL_COMMUNITY): Payer: Managed Care, Other (non HMO) | Admitting: *Deleted

## 2012-04-08 ENCOUNTER — Telehealth (HOSPITAL_COMMUNITY): Payer: Self-pay

## 2012-04-12 ENCOUNTER — Ambulatory Visit (HOSPITAL_COMMUNITY): Payer: Managed Care, Other (non HMO) | Admitting: *Deleted

## 2012-04-15 ENCOUNTER — Ambulatory Visit (HOSPITAL_COMMUNITY): Payer: Managed Care, Other (non HMO) | Admitting: Physical Therapy

## 2012-04-15 ENCOUNTER — Ambulatory Visit (INDEPENDENT_AMBULATORY_CARE_PROVIDER_SITE_OTHER): Payer: Managed Care, Other (non HMO) | Admitting: Orthopedic Surgery

## 2012-04-15 ENCOUNTER — Encounter: Payer: Self-pay | Admitting: Orthopedic Surgery

## 2012-04-15 VITALS — BP 110/80 | Ht 63.0 in | Wt 248.0 lb

## 2012-04-15 DIAGNOSIS — Z9889 Other specified postprocedural states: Secondary | ICD-10-CM

## 2012-04-15 NOTE — Progress Notes (Signed)
Patient ID: Rhonda Patton, female   DOB: [birth year], 51 y.o.   MRN: [id number] Chief Complaint  Patient presents with  . Follow-up    2 week follow up left knee, DOS 03/08/12    BP 110/80  Ht 5\' 3"  (1.6 m)  Wt 248 lb (112.492 kg)  BMI 43.93 kg/m2  Status post arthroscopy, LEFT knee, with partial meniscectomy.  The patient is still having some pain, and she's missed a couple of therapy sessions secondary to exacerbation of an underlying back condition.  The straight leg raises, and knee extension, seem to be bothering her so I placed a new order to change those to terminal knee extensions and quad sets.  She can work on range of motion and modalities to control swelling and pain and increased flexion,  She'll stay out of work until July 15 with a followup visit scheduled at that time for me to reassess her situation.

## 2012-04-15 NOTE — Patient Instructions (Signed)
Continue therapy with new exercises   Out of work through July 15th

## 2012-05-03 ENCOUNTER — Encounter: Payer: Self-pay | Admitting: Orthopedic Surgery

## 2012-05-03 ENCOUNTER — Ambulatory Visit (INDEPENDENT_AMBULATORY_CARE_PROVIDER_SITE_OTHER): Payer: Managed Care, Other (non HMO) | Admitting: Orthopedic Surgery

## 2012-05-03 VITALS — BP 118/80 | Ht 63.0 in | Wt 248.0 lb

## 2012-05-03 DIAGNOSIS — IMO0002 Reserved for concepts with insufficient information to code with codable children: Secondary | ICD-10-CM

## 2012-05-03 DIAGNOSIS — S83242A Other tear of medial meniscus, current injury, left knee, initial encounter: Secondary | ICD-10-CM

## 2012-05-03 DIAGNOSIS — Z9889 Other specified postprocedural states: Secondary | ICD-10-CM

## 2012-05-03 MED ORDER — HYDROCODONE-ACETAMINOPHEN 7.5-325 MG PO TABS: 1.0000 | ORAL_TABLET | Freq: Four times a day (QID) | ORAL | Status: AC | PRN

## 2012-05-03 NOTE — Progress Notes (Signed)
Subjective:     Patient ID: Rhonda Patton, female   DOB: 11-24-1960, 51 y.o.   MRN: [id number] Chief Complaint  Patient presents with  . Follow-up    recheck left knee, DOS 03/08/12    HPI This patient arthroscopy left knee has significant arthritic changes along with a medial meniscal tear she presents now with swelling of the knee at the end of the day. 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 No medial or lateral joint line tenderness but there is joint effusion. She has full knee flexion painful knee extension with normal stability in the knee muscle tone is normal skin is intact good pulse    Assessment:     Joint effusion after knee arthroscopy history of arthritis    Plan:     Inject knee continue anti-inflammatory and ice at the end of the day  Knee  Injection Procedure Note  Pre-operative Diagnosis: left knee oa  Post-operative Diagnosis: same  Indications: pain  Anesthesia: ethyl chloride   Procedure Details   Verbal consent was obtained for the procedure. Time out was completed.The joint was prepped with alcohol, followed by  Ethyl chloride spray and A 20 gauge needle was inserted into the knee via lateral approach; 4ml 1% lidocaine and 1 ml of depomedrol  was then injected into the joint . The needle was removed and the area cleansed and dressed.  Complications:  None; patient tolerated the procedure well.

## 2012-05-03 NOTE — Patient Instructions (Addendum)
Continue diclofenac and ice at night   You have received a steroid shot. 15% of patients experience increased pain at the injection site with in the next 24 hours. This is best treated with ice and tylenol extra strength 2 tabs every 8 hours. If you are still having pain please call the office.   Follow up as needed

## 2012-05-07 ENCOUNTER — Encounter: Payer: Self-pay | Admitting: Cardiology

## 2012-07-16 ENCOUNTER — Other Ambulatory Visit: Payer: Self-pay | Admitting: Orthopedic Surgery

## 2012-07-16 DIAGNOSIS — M1710 Unilateral primary osteoarthritis, unspecified knee: Secondary | ICD-10-CM

## 2012-07-19 ENCOUNTER — Other Ambulatory Visit: Payer: Self-pay | Admitting: *Deleted

## 2012-10-07 ENCOUNTER — Other Ambulatory Visit: Payer: Self-pay | Admitting: Orthopedic Surgery

## 2012-10-07 DIAGNOSIS — M1710 Unilateral primary osteoarthritis, unspecified knee: Secondary | ICD-10-CM | POA: Insufficient documentation

## 2012-10-07 MED ORDER — HYDROCODONE-ACETAMINOPHEN 7.5-325 MG PO TABS: 1.0000 | ORAL_TABLET | Freq: Four times a day (QID) | ORAL | Status: DC | PRN

## 2013-01-10 ENCOUNTER — Other Ambulatory Visit: Payer: Self-pay | Admitting: *Deleted

## 2013-01-10 DIAGNOSIS — M1990 Unspecified osteoarthritis, unspecified site: Secondary | ICD-10-CM

## 2013-01-10 MED ORDER — HYDROCODONE-ACETAMINOPHEN 5-325 MG PO TABS: 1.0000 | ORAL_TABLET | Freq: Four times a day (QID) | ORAL | Status: DC | PRN

## 2013-01-27 ENCOUNTER — Other Ambulatory Visit: Payer: Self-pay | Admitting: *Deleted

## 2013-01-27 DIAGNOSIS — M1990 Unspecified osteoarthritis, unspecified site: Secondary | ICD-10-CM

## 2013-01-27 MED ORDER — HYDROCODONE-ACETAMINOPHEN 5-325 MG PO TABS: 1.0000 | ORAL_TABLET | Freq: Four times a day (QID) | ORAL | Status: DC | PRN

## 2013-09-05 ENCOUNTER — Other Ambulatory Visit: Payer: Self-pay | Admitting: Orthopedic Surgery

## 2013-09-05 ENCOUNTER — Encounter: Payer: Self-pay | Admitting: Orthopedic Surgery

## 2013-09-05 ENCOUNTER — Telehealth: Payer: Self-pay | Admitting: Orthopedic Surgery

## 2013-09-05 DIAGNOSIS — M1990 Unspecified osteoarthritis, unspecified site: Secondary | ICD-10-CM

## 2013-09-05 MED ORDER — HYDROCODONE-ACETAMINOPHEN 5-325 MG PO TABS: 1.0000 | ORAL_TABLET | Freq: Three times a day (TID) | ORAL | Status: AC | PRN

## 2013-09-05 NOTE — Telephone Encounter (Signed)
Patient called regarding refill for Hydrocodone/Norco 5-325, which she was advised by [facility] of the change in pharmaceutical regulations for pain medication refills.  She said the pharmacy may have faxed it.  Please advise - and also note, patient's last office visit was 05/03/12, in event she is to schedule appointment.  Her ph# is [phone].

## 2013-09-05 NOTE — Telephone Encounter (Signed)
Received fax for refill request, and Dr. Romeo Apple gave the ok to refill. Prescription was printed, and patient was called to pick it up.

## 2013-10-21 ENCOUNTER — Telehealth: Payer: Self-pay | Admitting: Orthopedic Surgery

## 2013-10-21 NOTE — Telephone Encounter (Signed)
Patient called requesting refill on Hydrocodone 5/325; patient was last seen here 05/01/12, status/post left knee arthroscopy; patient was to return as needed.  No other scheduled appointments.  I have not located this medication on patient's list.  Her phone # is [phone].

## 2013-10-24 ENCOUNTER — Other Ambulatory Visit: Payer: Self-pay | Admitting: Orthopedic Surgery

## 2013-10-24 MED ORDER — HYDROCODONE-ACETAMINOPHEN 5-325 MG PO TABS
1.0000 | ORAL_TABLET | Freq: Four times a day (QID) | ORAL | Status: DC | PRN
Start: 2013-10-24 — End: 2013-12-07

## 2013-10-24 NOTE — Telephone Encounter (Signed)
Routing to Dr Harrison 

## 2013-10-24 NOTE — Telephone Encounter (Signed)
Patient picked up prescription 10/24/13.

## 2013-12-05 ENCOUNTER — Telehealth: Payer: Self-pay | Admitting: Orthopedic Surgery

## 2013-12-05 NOTE — Telephone Encounter (Signed)
Routing to Dr Harrison 

## 2013-12-05 NOTE — Telephone Encounter (Signed)
Courney Scarola wants a prescription for Hydrocodone 5/325

## 2013-12-07 ENCOUNTER — Other Ambulatory Visit: Payer: Self-pay | Admitting: *Deleted

## 2013-12-07 DIAGNOSIS — M25569 Pain in unspecified knee: Secondary | ICD-10-CM

## 2013-12-07 MED ORDER — HYDROCODONE-ACETAMINOPHEN 5-325 MG PO TABS: 1.0000 | ORAL_TABLET | Freq: Four times a day (QID) | ORAL | Status: DC | PRN

## 2013-12-07 NOTE — Telephone Encounter (Signed)
REFILL   ADVISE SHE NEEDS TO GO TO CHRONIC PAIN MANAGER   REFER TO DR Lyla Son

## 2013-12-07 NOTE — Telephone Encounter (Signed)
Advised patient that prescription was ready for pick up, and that a referral would be sent to Dr. Aundria Rud for pain management.

## 2013-12-07 NOTE — Telephone Encounter (Signed)
Referred to Dr. Lyla Son for pain management. Office notes faxed. Awaiting appointment.

## 2014-01-09 ENCOUNTER — Telehealth: Payer: Self-pay | Admitting: *Deleted

## 2014-01-09 ENCOUNTER — Encounter: Payer: Self-pay | Admitting: Orthopedic Surgery

## 2014-01-09 NOTE — Telephone Encounter (Signed)
Received fax from Dr. Delbert Phenix office that they have unable to reach patient to schedule an appointment. We gave them our updated information, and notified patient to call their office to schedule an appointment.

## 2014-01-18 ENCOUNTER — Telehealth: Payer: Self-pay | Admitting: *Deleted

## 2014-01-18 NOTE — Telephone Encounter (Signed)
Call received from [place] at [facility], she wanted to make you aware that the patient is receiving percocet 5/325 #135 from Dr. Caprice Beaver, as well as the hydrocodone 5/325 mg #180 you prescribed. She also goes to multiple pharmacies to fill. The pharmacist faxed you a note, and we placed it in your box. The patient had already filled your prescription, however, she was trying to fill the prescription from Dr. Caprice Beaver today. They were notifying his office as well.

## 2014-01-23 ENCOUNTER — Telehealth: Payer: Self-pay | Admitting: *Deleted

## 2014-01-23 NOTE — Telephone Encounter (Signed)
Patient has an appointment with Dr. Dorothey Baseman 01/26/14 at 9:15 am.

## 2014-03-20 ENCOUNTER — Encounter (HOSPITAL_COMMUNITY): Payer: Self-pay | Admitting: Pharmacy Technician

## 2014-03-23 ENCOUNTER — Other Ambulatory Visit: Payer: Self-pay | Admitting: Podiatry

## 2014-03-30 NOTE — Addendum Note (Signed)
Addended by: Caprice Beaver on: 03/30/2014 10:48 AM   Modules accepted: Orders

## 2014-03-31 ENCOUNTER — Encounter (HOSPITAL_COMMUNITY): Admission: RE | Admit: 2014-03-31 | Payer: Managed Care, Other (non HMO) | Source: Ambulatory Visit

## 2014-04-06 ENCOUNTER — Ambulatory Visit (HOSPITAL_COMMUNITY): Admission: RE | Admit: 2014-04-06 | Payer: 59 | Source: Ambulatory Visit | Admitting: Podiatry

## 2014-04-06 ENCOUNTER — Encounter (HOSPITAL_COMMUNITY): Admission: RE | Payer: Self-pay | Source: Ambulatory Visit

## 2014-04-06 SURGERY — EXCISION, MORTON'S NEUROMA
Anesthesia: Monitor Anesthesia Care | Laterality: Right

## 2014-04-27 ENCOUNTER — Ambulatory Visit: Payer: 59 | Admitting: Orthopedic Surgery

## 2014-08-24 ENCOUNTER — Ambulatory Visit (INDEPENDENT_AMBULATORY_CARE_PROVIDER_SITE_OTHER): Payer: 59 | Admitting: Medical

## 2014-08-24 ENCOUNTER — Encounter: Payer: Self-pay | Admitting: Medical

## 2014-08-24 VITALS — BP 110/70 | HR 60 | Temp 97.8°F | Resp 15 | Ht 63.5 in | Wt 242.0 lb

## 2014-08-24 DIAGNOSIS — K921 Melena: Secondary | ICD-10-CM

## 2014-08-24 DIAGNOSIS — R1030 Lower abdominal pain, unspecified: Secondary | ICD-10-CM

## 2014-08-24 DIAGNOSIS — R300 Dysuria: Secondary | ICD-10-CM

## 2014-08-24 LAB — COMPREHENSIVE METABOLIC PANEL
ALK PHOS: 67 U/L (ref 39–117)
ALT: 20 U/L (ref 0–35)
AST: 18 U/L (ref 0–37)
Albumin: 3.8 g/dL (ref 3.5–5.2)
BUN: 7 mg/dL (ref 6–23)
CO2: 25 mEq/L (ref 19–32)
Calcium: 9.2 mg/dL (ref 8.4–10.5)
Chloride: 104 mEq/L (ref 96–112)
Creat: 0.9 mg/dL (ref 0.50–1.10)
Glucose, Bld: 88 mg/dL (ref 70–99)
POTASSIUM: 3.9 meq/L (ref 3.5–5.3)
SODIUM: 139 meq/L (ref 135–145)
Total Bilirubin: 0.7 mg/dL (ref 0.2–1.2)
Total Protein: 6.6 g/dL (ref 6.0–8.3)

## 2014-08-24 LAB — POCT URINALYSIS DIPSTICK
Bilirubin, UA: NEGATIVE
Blood, UA: NEGATIVE
Glucose, UA: NEGATIVE
Ketones, UA: NEGATIVE
Leukocytes, UA: NEGATIVE
NITRITE UA: NEGATIVE
PROTEIN UA: NEGATIVE
Spec Grav, UA: 1.015
UROBILINOGEN UA: NEGATIVE
pH, UA: 7

## 2014-08-24 LAB — CBC WITH DIFFERENTIAL/PLATELET
BASOS ABS: 0 10*3/uL (ref 0.0–0.1)
BASOS PCT: 1 % (ref 0–1)
Eosinophils Absolute: 0.2 10*3/uL (ref 0.0–0.7)
Eosinophils Relative: 5 % (ref 0–5)
HCT: 40.1 % (ref 36.0–46.0)
HEMOGLOBIN: 13.5 g/dL (ref 12.0–15.0)
LYMPHS PCT: 42 % (ref 12–46)
Lymphs Abs: 1.7 10*3/uL (ref 0.7–4.0)
MCH: 28.1 pg (ref 26.0–34.0)
MCHC: 33.7 g/dL (ref 30.0–36.0)
MCV: 83.4 fL (ref 78.0–100.0)
Monocytes Absolute: 0.5 10*3/uL (ref 0.1–1.0)
Monocytes Relative: 12 % (ref 3–12)
NEUTROS ABS: 1.6 10*3/uL — AB (ref 1.7–7.7)
Neutrophils Relative %: 40 % — ABNORMAL LOW (ref 43–77)
Platelets: 220 10*3/uL (ref 150–400)
RBC: 4.81 MIL/uL (ref 3.87–5.11)
RDW: 15.2 % (ref 11.5–15.5)
WBC: 4 10*3/uL (ref 4.0–10.5)

## 2014-08-24 NOTE — Progress Notes (Signed)
Subjective: Here as a new patient today.  Used to see me back in [place], [place] as a patient.    She is here for blood, not sure if from vagina, rectum, or bladder or all.  This started this past Saturday, cleared up by Monday of this week.     She notes having stomach pains for 3 months, but has had pain and burning with urination for over a year since prior bladder polys removed from Urologist in [place].   She is s/p total hysterectomy due to fibroids.   She notes chronic ongoing burning and pain with urination.  Belly pain x 3 months, but worse pain and blood this past weekend.  Sometimes also gets pain with BMs, like a knot at the rectum.   The recent blood was dark on the toilet paper only.  However, she is not sure if from vagina, rectum or urethra.  It was hard to say.    Currently she still notes lower abdominal pain, burning and pain with urination.  Denies fever, nausea, vomiting, constipation.  No vaginal pain.  Has chronic back pain.  Has GERD ,no other GI diagnosis.  Has hx/o UTI.   Last colonoscopy, unsure, but maybe within a few years, [place] 5-6 x daily.  No cloudy urine, no odor.   Has BM usually once daily, normal.  +bloating, straining.    She was on antibiotic for respiratory infection few weeks, was seen in [place] ED, had chest CT?, labs that were reportedly abnormal.   ROS as in subjective   Objective: Filed Vitals:   08/24/14 1420  BP: 110/70  Pulse: 60  Temp: 97.8 F (36.6 C)  Resp: 15    General appearance: alert, no distress, WD/WN, obese AA female HEENT: normocephalic, sclerae anicteric, TMs pearly, nares patent, no discharge or erythema, pharynx normal Oral cavity: MMM, no lesions Neck: supple, no lymphadenopathy, no thyromegaly, no masses Heart: RRR, normal S1, S2, no murmurs Lungs: CTA bilaterally, no wheezes, rhonchi, or rales Abdomen: +bs, soft, generalized lower abdominal tenderness, vertical lower abdomin surgical scar, otherwise  non tender, non distended, no masses, no hepatomegaly, no splenomegaly Back: nontender Pulses: 2+ symmetric, upper and lower extremities, normal cap refill Ext: no edema Gyn: Normal external genitalia without lesions, vagina with normal mucosa,s/p hysterectomy, no abnormal vaginal discharge.  Nontender, no masses.  Exam chaperoned by nurse. Rectal: anus normal appearing, no perirectal abnormality, tone normal, occult negative stool, no lesions    Assessment: Encounter Diagnoses  Name Primary?  . Abdominal pain, lower Yes  . Dysuria   . Blood in stool       Plan: Etiology unclear.  I suspect recent gastroenteritis, but dark blood in stool questionable?  Today urinalysis unremarkable, rectal and vaginal exam unremarkable.  She is tender throughout lower abdomen.  Will get labs, will request recent records, labs, imaging and prior colonoscopy from [facility], request other general prior records.   For now, advised good hydration, bland diet, probiotic, and f/u pending labs.

## 2014-08-24 NOTE — Addendum Note (Signed)
Addended by: Louie Bun on: 08/24/2014 03:29 PM   Modules accepted: Orders

## 2014-08-26 LAB — URINE CULTURE

## 2014-08-28 ENCOUNTER — Telehealth: Payer: Self-pay | Admitting: Medical

## 2014-08-28 NOTE — Telephone Encounter (Signed)
Patient states that she is still having belly pain and dark stool but no more bleeding. She will try and get a copy of colonoscopy.

## 2014-08-28 NOTE — Telephone Encounter (Signed)
We'll need to get a copy of the colonoscopy from [facility]. I did however receive the emergency department visit notes from October 16 [facility].  See how her symptoms are doing? Does she still have belly pain, dark stool, bleeding anywhere?  There was nothing significant from the ED report.

## 2014-08-29 ENCOUNTER — Encounter: Payer: 59 | Admitting: Medical

## 2014-08-29 NOTE — Telephone Encounter (Signed)
At this point if still having pain, we should pursue abdominal CT vs GI referral.  See what she wants to do?

## 2014-08-31 ENCOUNTER — Encounter: Payer: Self-pay | Admitting: Medical

## 2014-08-31 NOTE — Telephone Encounter (Signed)
LM to CB

## 2014-09-01 ENCOUNTER — Encounter: Payer: 59 | Admitting: Medical

## 2014-09-01 NOTE — Telephone Encounter (Signed)
pATIENT IS COMING FOR A OV CAN GO OVER AT VISIT

## 2014-09-05 ENCOUNTER — Telehealth: Payer: Self-pay | Admitting: Medical

## 2014-09-05 NOTE — Telephone Encounter (Signed)
Records received from previous doc. Send back for review. Please note what needs to be scanned or abstracted.

## 2014-09-05 NOTE — Telephone Encounter (Signed)
Done

## 2014-09-18 ENCOUNTER — Ambulatory Visit (INDEPENDENT_AMBULATORY_CARE_PROVIDER_SITE_OTHER): Payer: 59 | Admitting: Medical

## 2014-09-18 ENCOUNTER — Encounter: Payer: Self-pay | Admitting: Medical

## 2014-09-18 ENCOUNTER — Telehealth: Payer: Self-pay | Admitting: Medical

## 2014-09-18 VITALS — BP 108/80 | HR 68 | Temp 97.9°F | Resp 16 | Wt 249.0 lb

## 2014-09-18 DIAGNOSIS — E669 Obesity, unspecified: Secondary | ICD-10-CM

## 2014-09-18 DIAGNOSIS — K219 Gastro-esophageal reflux disease without esophagitis: Secondary | ICD-10-CM

## 2014-09-18 DIAGNOSIS — R609 Edema, unspecified: Secondary | ICD-10-CM

## 2014-09-18 DIAGNOSIS — R300 Dysuria: Secondary | ICD-10-CM

## 2014-09-18 DIAGNOSIS — E785 Hyperlipidemia, unspecified: Secondary | ICD-10-CM

## 2014-09-18 DIAGNOSIS — M549 Dorsalgia, unspecified: Secondary | ICD-10-CM

## 2014-09-18 DIAGNOSIS — G8929 Other chronic pain: Secondary | ICD-10-CM

## 2014-09-18 DIAGNOSIS — I10 Essential (primary) hypertension: Secondary | ICD-10-CM

## 2014-09-18 MED ORDER — POTASSIUM CHLORIDE ER 10 MEQ PO TBCR: 10.0000 meq | EXTENDED_RELEASE_TABLET | Freq: Every day | ORAL | Status: DC

## 2014-09-18 MED ORDER — HYDROCODONE-ACETAMINOPHEN 5-325 MG PO TABS: 1.0000 | ORAL_TABLET | Freq: Four times a day (QID) | ORAL | Status: DC | PRN

## 2014-09-18 MED ORDER — PRAVASTATIN SODIUM 40 MG PO TABS: 40.0000 mg | ORAL_TABLET | Freq: Every morning | ORAL | Status: DC

## 2014-09-18 MED ORDER — RANITIDINE HCL 75 MG PO TABS: 75.0000 mg | ORAL_TABLET | Freq: Every evening | ORAL | Status: DC

## 2014-09-18 MED ORDER — AMLODIPINE BESYLATE 5 MG PO TABS: 5.0000 mg | ORAL_TABLET | Freq: Every day | ORAL | Status: DC

## 2014-09-18 MED ORDER — FUROSEMIDE 20 MG PO TABS: 20.0000 mg | ORAL_TABLET | Freq: Every morning | ORAL | Status: DC

## 2014-09-18 NOTE — Telephone Encounter (Signed)
Referral to [facility] - dysuria, hx/o bladder polpys, urinary frequency, blood in urine

## 2014-09-18 NOTE — Progress Notes (Signed)
Subjective: Here for med check, follow up.    She is a former patient of mine in [place], saw me recently for abdominal pain, pelvic pain.   Here to f/u today.  I saw her recently for abdominal and back pain, dysuria, blood in the urine. At this point she is seen no improvement. Still having belly and back pain .  Having pain with urination x 62mo, since polyps were removed from her bladder. Was seeing urology in [place], [place], but the urologist has moved.  She reports Incontinence.  Urinary frequency 4 times night, 3 times per day.  Gets suprapubic pain, pain continues to minutes after urinating. Denies vaginal complaint.   Night sweats 5 years, had hysterectomy 12 years ago, last UTI 5 years ago.  Mom had bladder and kidney issues.   No fever, no current blood in stool or urine.    Has a history of chronic back pain for years, last x-rays years ago. On average takes hydrocodone once daily but needs a refill on this has been on this for a long time.   Gets occasional numbness and tingling in her legs. No recent fevers, no unexpected weight change.  Is limited with range of motion due to pain. Gets low back pain daily. No new changes in her back pain.   Sees podiatry for foot issue, takes gabapentin 100 mg daily.   Has a history of hypertension, compliant with medication without complaint.  Takes Norvasc 5 mg daily.  Has a history of swelling in the legs, compliant with Lasix 20 mg daily along with K-dur 10 mEq daily  History of GERD, takes ranitidine daily.  History of high cholesterol, takes Pravachol 40 mg daily. Not sure when her last fasting labs were done. Does have history of normal cardiac cath 6 years ago per her report. This was done as part of hospitalization for chest pain  Has a history of sleep apnea, not using CPAP machine,last sleep study 3 years ago  Has had a colonoscopy in recent years, normal per her report  ROS as in subjective  Objective: BP 108/80 mmHg  Pulse 68  Temp(Src)  97.9 F (36.6 C) (Oral)  Resp 16  Wt 249 lb (112.946 kg)  General appearance: alert, no distress, WD/WN, obese AA female Neck: supple, no lymphadenopathy, no thyromegaly, no masses Heart: RRR, normal S1, S2, no murmurs Lungs: CTA bilaterally, no wheezes, rhonchi, or rales Abdomen: +bs, soft, generalized lower abdominal tenderness, upper port surgical scars, vertical lower abdomin surgical scar, otherwise non tender, non distended, no masses, no hepatomegaly, no splenomegaly Back: lumbar midline tenderness, pain with flexion and extension which is limited due to pain, no other deformity Pulses: 2+ symmetric, upper and lower extremities, normal cap refill Ext: no edema Neuro: unable to fully perform heel walk, can do toe walk fine, normal LE strength, sensation   Assessment: Encounter Diagnoses  Name Primary?  . Chronic back pain Yes  . Dysuria   . Hyperlipidemia   . Essential hypertension   . Obesity   . Gastroesophageal reflux disease without esophagitis   . Edema    Plan: Chronic back pain-gave prescription for order to go have lumbar spine x-ray up in [place] at the [facility].  C/t Hydrocodone, daily stretching, need to work on weight loss  Dysuria-referral to urology.  Review my recent notes, recent hospital evaluation for abdominal pain at [facility] in [place], [place]  Hyperlipidemia-gave lab order to go have fasting lipids, continue current medication  Hypertension-continue current medication  Obesity - need to work on lifestyle changes to lose weight  GERD-continue current medication  Edema-controlled on current medication  Follow-up pending labs, requested additional records from prior PCP/[facility] including sleep study, colonoscopy, last few office notes, cardiac cath results from prior

## 2014-09-19 NOTE — Telephone Encounter (Signed)
I fax over her information to [facility] to [phone] and they will schedule her appointment

## 2014-10-04 ENCOUNTER — Other Ambulatory Visit: Payer: Self-pay | Admitting: Family Medicine

## 2014-10-04 DIAGNOSIS — R300 Dysuria: Secondary | ICD-10-CM

## 2014-10-04 DIAGNOSIS — R350 Frequency of micturition: Secondary | ICD-10-CM

## 2014-10-04 DIAGNOSIS — R319 Hematuria, unspecified: Secondary | ICD-10-CM

## 2014-10-05 ENCOUNTER — Encounter: Payer: Self-pay | Admitting: Medical

## 2014-10-16 ENCOUNTER — Telehealth: Payer: Self-pay | Admitting: Medical

## 2014-10-17 ENCOUNTER — Encounter: Payer: Self-pay | Admitting: Medical

## 2014-10-17 NOTE — Telephone Encounter (Signed)
pls pull the records awaiting scan so I can review the xrays

## 2014-10-17 NOTE — Telephone Encounter (Signed)
i'm looking for L spine MRI and xray.   Something came in on this but I don't see it

## 2014-10-17 NOTE — Telephone Encounter (Signed)
The records from [facility] , xray of Abdomen and xray of l-spine are all scanned into the chart

## 2014-10-18 ENCOUNTER — Telehealth: Payer: Self-pay | Admitting: Medical

## 2014-10-18 NOTE — Telephone Encounter (Signed)
Since she has to come in to pick up hydrocodone script, lets make f/u appt to discuss xray and labs, and next steps.

## 2014-10-18 NOTE — Telephone Encounter (Signed)
Called and left a message for pt to call back and schedule an appt to discuss xray and labs and at appt she can pick up her script for pain med

## 2014-10-25 ENCOUNTER — Telehealth: Payer: Self-pay | Admitting: Medical

## 2014-10-25 ENCOUNTER — Encounter: Payer: Self-pay | Admitting: Medical

## 2014-10-25 ENCOUNTER — Ambulatory Visit (INDEPENDENT_AMBULATORY_CARE_PROVIDER_SITE_OTHER): Payer: 59 | Admitting: Medical

## 2014-10-25 VITALS — BP 100/78 | HR 60 | Temp 98.0°F | Resp 14 | Wt 248.0 lb

## 2014-10-25 DIAGNOSIS — E785 Hyperlipidemia, unspecified: Secondary | ICD-10-CM

## 2014-10-25 DIAGNOSIS — G4733 Obstructive sleep apnea (adult) (pediatric): Secondary | ICD-10-CM

## 2014-10-25 DIAGNOSIS — E669 Obesity, unspecified: Secondary | ICD-10-CM

## 2014-10-25 DIAGNOSIS — R202 Paresthesia of skin: Secondary | ICD-10-CM

## 2014-10-25 DIAGNOSIS — G8929 Other chronic pain: Secondary | ICD-10-CM

## 2014-10-25 DIAGNOSIS — I10 Essential (primary) hypertension: Secondary | ICD-10-CM

## 2014-10-25 DIAGNOSIS — M545 Low back pain: Secondary | ICD-10-CM

## 2014-10-25 DIAGNOSIS — R7301 Impaired fasting glucose: Secondary | ICD-10-CM

## 2014-10-25 MED ORDER — HYDROCODONE-ACETAMINOPHEN 5-325 MG PO TABS: 1.0000 | ORAL_TABLET | Freq: Four times a day (QID) | ORAL | Status: DC | PRN

## 2014-10-25 MED ORDER — GABAPENTIN 300 MG PO CAPS: 300.0000 mg | ORAL_CAPSULE | Freq: Two times a day (BID) | ORAL | Status: DC

## 2014-10-25 MED ORDER — ATORVASTATIN CALCIUM 40 MG PO TABS: 40.0000 mg | ORAL_TABLET | Freq: Every day | ORAL | Status: DC

## 2014-10-25 MED ORDER — DICLOFENAC SODIUM 75 MG PO TBEC: 75.0000 mg | DELAYED_RELEASE_TABLET | Freq: Two times a day (BID) | ORAL | Status: DC

## 2014-10-25 MED ORDER — FUROSEMIDE 20 MG PO TABS: 20.0000 mg | ORAL_TABLET | Freq: Every morning | ORAL | Status: DC

## 2014-10-25 MED ORDER — POTASSIUM CHLORIDE ER 10 MEQ PO TBCR
10.0000 meq | EXTENDED_RELEASE_TABLET | Freq: Every day | ORAL | Status: DC
Start: 2014-10-25 — End: 2017-08-25

## 2014-10-25 NOTE — Telephone Encounter (Signed)
please set up MRI lumbar spine without contrast for chronic back pain paresthesias of both legs. Set this up at the [facility] in [place]

## 2014-10-25 NOTE — Telephone Encounter (Signed)
I left a message on the patient's voice mail.

## 2014-10-25 NOTE — Telephone Encounter (Signed)
Patient has an appointment to have a MRI Lspine on 10/27/14 @ 1230 pm at the [facility] in [place], [place]

## 2014-10-25 NOTE — Telephone Encounter (Signed)
I called over to the [facility] center I had to leave a message @ [phone] and they will call back to schedule the MRI- l-spine

## 2014-10-25 NOTE — Patient Instructions (Signed)
We made the following recommendations/changes today:  Stop gabapentin 100 mg daily  Begin gabapentin 300 mg once daily at bedtime  After 1 week increase to twice daily gabapentin 300 mg  Continue hydrocodone pain pill but use this sparingly  Continue diclofenac 75 mg twice daily for now for pain and inflammation  We will refer you for MRI of the lumbar spine  Stop Pravachol and begin Lipitor at bedtime for cholesterol  We will plan to recheck your cholesterol and diabetes sugar markers in 3 months fasting  We will call with MRI results and next steps  Try and work on doing daily stretching for the back pain  Continue all other medications as usual

## 2014-10-25 NOTE — Progress Notes (Signed)
Subjective: Here for follow up. At her last visit we discussed numerous issues.  One issue was urinary issues and she has follow-up with urology in February.  The main issue  today is chronic back pain.  history of chronic back pain for years, last x-rays was recently from her last visit.  No prior MRI. On average takes hydrocodone once daily but needs a refill on this has been on this for a long time.   Gets occasional numbness and tingling in her legs. No recent fevers, no unexpected weight change.  Is limited with range of motion due to pain. Gets low back pain daily. No new changes in her back pain. 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   Has a history of hypertension, compliant with medication without complaint.  Takes Norvasc 5 mg daily.  Has a history of swelling in the legs, compliant with Lasix 20 mg daily along with K-dur 10 mEq daily  History of high cholesterol, takes Pravachol 40 mg daily. Does have history of normal cardiac cath 6 years ago per her report. This was done as part of hospitalization for chest pain.  Here to f/u on recent labs.  Has a history of sleep apnea, not using CPAP machine,last sleep study 3-5 years ago.  Still has daytime somnolence, fatigue, doesn't feel rested in the mornings, snores loudly  ROS as in subjective  Objective: BP 100/78 mmHg  Pulse 60  Temp(Src) 98 F (36.7 C) (Oral)  Resp 14  Wt 248 lb (112.492 kg)  General appearance: alert, no distress, WD/WN, obese AA female Neck: supple, no lymphadenopathy, no thyromegaly, no masses Heart: RRR, normal S1, S2, no murmurs Lungs: CTA bilaterally, no wheezes, rhonchi, or rales Abdomen: +bs, soft, generalized lower abdominal tenderness, upper port surgical scars, vertical lower abdomin surgical scar, otherwise non tender, non distended, no masses, no  hepatomegaly, no splenomegaly Back: lumbar midline tenderness, pain with flexion and extension which is limited due to pain, no other deformity Pulses: 2+ symmetric, upper and lower extremities, normal cap refill Ext: no edema Neuro: unable to fully perform heel walk, can do toe walk fine, LE thigh strength seems reduced but still 4-5/5, worse on left, normal sensation, DTRs 1-2+ bilat LE   Assessment: Encounter Diagnoses  Name Primary?  . Chronic lower back pain Yes  . Paresthesia of both lower extremities   . Obesity   . Obstructive sleep apnea   . Impaired fasting blood sugar   . Hyperlipidemia   . Essential hypertension    Plan: Chronic back pain, paresthesias of lower extremities- we reviewed her recent lumbar spine x-ray showing spondylolisthesis, mild scoliosis, disc height lessening. We discussed options moving forward. We will set up for lumbar spine MRI. For now continue diclofenac twice daily, increase gabapentin to 300 mg twice daily, advise she cut back using hydrocodone less. Advise that I could not continue to prescribe hydrocodone for ever and that we need to get to an endpoint improvement in her symptoms.  Advised daily stretching routine. Follow-up pending MRI  Obesity-discussed need for lifestyle changes to lose weight  Sleep apnea not on CPAP - once we get the MRI and a plan moving for for the back pain we will pursue repeat sleep study  Impaired fasting glucose-recheck labs in 3 months  Hyperlipidemia-reviewed recent labs, stop Pravachol, change to Lipitor,  repeat fasting labs in 26mo.  Hypertension-continue current medication  Edema-controlled on current medication  Follow-up pending MRI.

## 2014-10-26 NOTE — Telephone Encounter (Signed)
Prior auth # [id number] for MRI Spine tomorrow at [facility] center. LM for Pt.

## 2014-10-27 ENCOUNTER — Encounter: Payer: Self-pay | Admitting: Orthopedic Surgery

## 2014-11-08 ENCOUNTER — Telehealth: Payer: Self-pay | Admitting: Medical

## 2014-11-08 NOTE — Telephone Encounter (Signed)
MRI from 10/27/14 reviewed.  Shows some disc bulging and bony changes around nerve roots.   I would recommend referral to back specialist for options, therapy, and make sure ortho/back specialist knows that we are not planning to continue her hydrocodone, that is part of the reason for the referral, for other treatment recommendations.  Find out where she wants to be seen, [facility] vs [place].  Not sure how many back specialist/ortho are up in [place] now?

## 2014-11-08 NOTE — Telephone Encounter (Signed)
LMOM TO CB. CLS 

## 2014-11-09 NOTE — Telephone Encounter (Signed)
LM again [facility]

## 2014-11-09 NOTE — Telephone Encounter (Signed)
LM to CB [facility] 

## 2014-11-13 NOTE — Telephone Encounter (Signed)
Patient is aware of her appointment to see Dr. Lorin Mercy on 11/16/14 @ 145 pm. [address] [place], [place]

## 2014-11-15 ENCOUNTER — Encounter: Payer: Self-pay | Admitting: Medical

## 2014-11-24 ENCOUNTER — Telehealth: Payer: Self-pay | Admitting: Medical

## 2014-11-24 NOTE — Telephone Encounter (Signed)
Please handle

## 2014-11-24 NOTE — Telephone Encounter (Signed)
Requesting referral to Dr Ernestina Patches. Pt says Audelia Acton referred her to Dr Lorin Mercy for her back and Dr Lorin Mercy is asking her to see Dr Ernestina Patches in [place] but she needs referral from [facility] to see Dr Ernestina Patches.

## 2014-11-28 ENCOUNTER — Other Ambulatory Visit: Payer: Self-pay | Admitting: Urology

## 2014-11-28 ENCOUNTER — Ambulatory Visit (INDEPENDENT_AMBULATORY_CARE_PROVIDER_SITE_OTHER): Payer: 59 | Admitting: Urology

## 2014-11-28 DIAGNOSIS — R300 Dysuria: Secondary | ICD-10-CM

## 2014-11-28 DIAGNOSIS — R310 Gross hematuria: Secondary | ICD-10-CM

## 2014-11-29 NOTE — Telephone Encounter (Signed)
I called over to [facility] and made them aware that I would take care of the referral but they would have to handle the appointment with the patient.   [facility] REFERRAL # [id number] COMPLETED ON 11/29/14 TO SEE DR. Ernestina Patches AT [facility].

## 2014-12-01 ENCOUNTER — Telehealth: Payer: Self-pay | Admitting: Medical

## 2014-12-01 ENCOUNTER — Other Ambulatory Visit: Payer: Self-pay | Admitting: Medical

## 2014-12-01 MED ORDER — HYDROCODONE-ACETAMINOPHEN 5-325 MG PO TABS: 1.0000 | ORAL_TABLET | Freq: Four times a day (QID) | ORAL | Status: DC | PRN

## 2014-12-01 NOTE — Telephone Encounter (Signed)
I did the referral ON THE [employer] and I fax everything over to [facility]. Dr. Romona Curls is who she is suppose to see for her back for the injections and the nurse is suppose to call her to set the appointment up.

## 2014-12-01 NOTE — Telephone Encounter (Signed)
Requesting refill on Hydrocodone 5-325mg 

## 2014-12-01 NOTE — Telephone Encounter (Signed)
I need to know as I will STOP prescribing the pain pill once she sees the orthopedist.   I refilled but she will need to pick up on the back door.

## 2014-12-01 NOTE — Telephone Encounter (Signed)
What is the status of the back specialist? Has she seen them yet?  They should be taking over management of her back pain.  Let me know.  I know she has seen the Urologist.

## 2014-12-01 NOTE — Telephone Encounter (Signed)
I left a message on the patients voicemail about her Rx being ready

## 2015-01-24 ENCOUNTER — Ambulatory Visit (HOSPITAL_COMMUNITY)
Admission: RE | Admit: 2015-01-24 | Discharge: 2015-01-24 | Disposition: A | Discharge status: Home / Self Care | Payer: BLUE CROSS/BLUE SHIELD | Source: Ambulatory Visit | Attending: Urology | Admitting: Urology

## 2015-01-24 ENCOUNTER — Encounter (HOSPITAL_COMMUNITY): Payer: Self-pay

## 2015-01-24 DIAGNOSIS — R310 Gross hematuria: Secondary | ICD-10-CM

## 2015-01-24 DIAGNOSIS — R309 Painful micturition, unspecified: Secondary | ICD-10-CM | POA: Diagnosis not present

## 2015-01-24 MED ORDER — IOHEXOL 300 MG/ML  SOLN
125.0000 mL | Freq: Once | INTRAMUSCULAR | Status: AC | PRN
  Administered 2015-01-24: 125 mL via INTRAVENOUS

## 2015-01-24 MED ORDER — SODIUM CHLORIDE 0.9 % IV SOLN
INTRAVENOUS | Status: AC
  Filled 2015-01-24: qty 250

## 2015-01-30 ENCOUNTER — Ambulatory Visit: Payer: 59 | Admitting: Urology

## 2015-03-13 ENCOUNTER — Telehealth: Payer: Self-pay | Admitting: Medical

## 2015-03-13 NOTE — Telephone Encounter (Signed)
Pt called and stated that the last refill on pain medication was never filled by her. She states that her son got very ill and she just never did. I called her usual pharmacy and they verified that she didn't have it filled there. Pt is requesting a refill on pain med. Pt can be reached at a NEW PHONE NUMBER [phone]. She uses [facility].

## 2015-03-14 NOTE — Telephone Encounter (Signed)
We had referred to Dr. Lorin Mercy in [place] back in January for chronic back pain.  I never got records from them, but they should be managing the back pain at this point .  Find out the status?  Did she see Dr. Lorin Mercy ,what was the recommendations?

## 2015-03-14 NOTE — Telephone Encounter (Signed)
Patient states that yes, she saw Dr. Lorin Mercy. She states that she is still seeing him and she has a upcoming appointment with him. I explain to her that Dr. Lorin Mercy should be taking care of her Rx for pain medication since she see's him for chronic back pain. She states she will call Dr. Lorin Mercy.

## 2015-03-14 NOTE — Telephone Encounter (Signed)
I left a message on the patients to call back [phone]

## 2015-03-15 NOTE — Telephone Encounter (Signed)
Rhonda Patton, patient called stating she has seen neurologist and need to know if anything was decided? Do you know about this?

## 2015-03-20 NOTE — Telephone Encounter (Signed)
Lets get notes from Dr. Lorin Mercy since I don't have them and have her make 6 mo f/u appt at this time.

## 2015-03-20 NOTE — Telephone Encounter (Signed)
I left a message for the patient to call back and set up her 6 month follow up appointment with Audelia Acton tysinger PA. I called [facility]. To get records from Dr. Lorin Mercy [phone] and they will fax over the last 2 OV notes.

## 2015-07-12 ENCOUNTER — Ambulatory Visit: Payer: 59 | Admitting: Medical

## 2015-07-25 ENCOUNTER — Encounter: Payer: Self-pay | Admitting: Medical

## 2015-11-03 ENCOUNTER — Other Ambulatory Visit: Payer: Self-pay | Admitting: Medical

## 2016-03-03 ENCOUNTER — Ambulatory Visit (INDEPENDENT_AMBULATORY_CARE_PROVIDER_SITE_OTHER): Payer: BLUE CROSS/BLUE SHIELD | Admitting: Orthopedic Surgery

## 2016-03-03 ENCOUNTER — Ambulatory Visit (INDEPENDENT_AMBULATORY_CARE_PROVIDER_SITE_OTHER): Payer: BLUE CROSS/BLUE SHIELD

## 2016-03-03 ENCOUNTER — Encounter: Payer: Self-pay | Admitting: Orthopedic Surgery

## 2016-03-03 VITALS — BP 134/81 | HR 71 | Ht 63.0 in | Wt 252.5 lb

## 2016-03-03 DIAGNOSIS — M5010 Cervical disc disorder with radiculopathy, unspecified cervical region: Secondary | ICD-10-CM | POA: Diagnosis not present

## 2016-03-03 DIAGNOSIS — M542 Cervicalgia: Secondary | ICD-10-CM | POA: Diagnosis not present

## 2016-03-03 MED ORDER — TIZANIDINE HCL 4 MG PO TABS: 4.0000 mg | ORAL_TABLET | Freq: Four times a day (QID) | ORAL | Status: DC | PRN

## 2016-03-03 MED ORDER — PREDNISONE 10 MG (48) PO TBPK
ORAL_TABLET | Freq: Every day | ORAL | Status: DC
Start: 2016-03-03 — End: 2016-11-27

## 2016-03-03 NOTE — Patient Instructions (Addendum)
STOP DICLOFENAC Meds ordered this encounter   Medications predniSONE (STERAPRED UNI-PAK 48 TAB) 10 MG (48) TBPK tablet Sig: Take by mouth daily. 10 MG DS 12 DAYS  tiZANidine (ZANAFLEX) 4 MG tablet Sig: Take 1 tablet (4 mg total) by mouth every 6 (six) hours as needed for muscle spasms.  CALL [facility] [place] TO SCHEDULE THERAPY VISITS [phone]   Cervical Radiculopathy Cervical radiculopathy means that a nerve in the neck is pinched or bruised. This can cause pain or loss of feeling (numbness) that runs from your neck to your arm and fingers. HOME CARE Managing Pain  Take over-the-counter and prescription medicines only as told by your doctor.  If directed, put ice on the injured or painful area.  Put ice in a plastic bag.  Place a towel between your skin and the bag.  Leave the ice on for 20 minutes, 2-3 times per day.  If ice does not help, you can try using heat. Take a warm shower or warm bath, or use a heat pack as told by your doctor.  You may try a gentle neck and shoulder massage. Activity  Rest as needed. Follow instructions from your doctor about any activities to avoid.  Do exercises as told by your doctor or physical therapist. General Instructions   If you were given a soft collar, wear it as told by your doctor.  Use a flat pillow when you sleep.  Keep all follow-up visits as told by your doctor. This is important. GET HELP IF:  Your condition does not improve with treatment. GET HELP RIGHT AWAY IF:   Your pain gets worse and is not controlled with medicine.  You lose feeling or feel weak in your hand, arm, face, or leg.  You have a fever.  You have a stiff neck.  You cannot control when you poop or pee (have incontinence).  You have trouble with walking, balance, or talking.   This information is not intended to replace advice given to you by your health care provider. Make sure you discuss any questions you have with your health  care provider.   Document Released: 09/25/2011 Document Revised: 06/27/2015 Document Reviewed: 11/30/2014 Elsevier Interactive Patient Education [employer].

## 2016-03-03 NOTE — Progress Notes (Signed)
Chief Complaint  Patient presents with  . Neck Pain    Pain in the right side of her neck that radiates down her arm and into her hand.    HPI 55 year old female presents with one-year history of cervical spine pain radiating to her right hand associated with burning aching pain numbness tingling stiffness and giving way symptoms in the right arm unrelieved by Tylenol ibuprofen worse with sleeping or use or overuse of the arm. Pain 8 out of 10 constant and sharp  No trauma  She reports frequent night sweats sore throat dental problems shortness of breath wheezing ankle leg swelling dizziness back pain swollen joints stiff joints joint pain but no problems with her walking.  ROS as noted in history of present illness  Past Medical History  Diagnosis Date  . Hypertension   . Hypercholesteremia   . Angina     occasional  . Sleep apnea     diagnosed but doesn't use CPAP  . Borderline diabetes   . GERD (gastroesophageal reflux disease)   . Anxiety   . Panic attack   . Arthritis   . Carpal tunnel syndrome of right wrist   . PONV (postoperative nausea and vomiting)     Past Surgical History  Procedure Laterality Date  . Cesarean section      x 3  . Cholecystectomy    . Hand surgery      x 2  . Foot surgery      x 2  . Partial hysterectomy  2001    total abdominal  . Abdominal hysterectomy     Family History  Problem Relation Age of Onset  . Heart disease    . Arthritis    . Cancer    . Diabetes     Social History  Substance Use Topics  . Smoking status: Never Smoker   . Smokeless tobacco: None  . Alcohol Use: No    Current outpatient prescriptions:  .  acetaminophen (TYLENOL) 500 MG tablet, Take 1,000 mg by mouth 2 (two) times daily as needed for mild pain or moderate pain., Disp: , Rfl:  .  amLODipine (NORVASC) 5 MG tablet, Take 1 tablet (5 mg total) by mouth daily., Disp: 90 tablet, Rfl: 1 .  atorvastatin (LIPITOR) 40 MG tablet, Take 1 tablet (40 mg total) by  mouth daily., Disp: 90 tablet, Rfl: 1 .  diclofenac (VOLTAREN) 75 MG EC tablet, Take 1 tablet (75 mg total) by mouth 2 (two) times daily., Disp: 60 tablet, Rfl: 0 .  estradiol (ESTRACE) 0.5 MG tablet, Take 0.5 mg by mouth every morning. , Disp: , Rfl:  .  furosemide (LASIX) 20 MG tablet, Take 1 tablet (20 mg total) by mouth every morning., Disp: 90 tablet, Rfl: 0 .  potassium chloride (K-DUR) 10 MEQ tablet, Take 1 tablet (10 mEq total) by mouth daily., Disp: 90 tablet, Rfl: 0 .  ranitidine (ZANTAC) 75 MG tablet, Take 1 tablet (75 mg total) by mouth every evening., Disp: 30 tablet, Rfl: 2 .  predniSONE (STERAPRED UNI-PAK 48 TAB) 10 MG (48) TBPK tablet, Take by mouth daily. 10 MG DS 12 DAYS, Disp: 48 tablet, Rfl: 1 .  tiZANidine (ZANAFLEX) 4 MG tablet, Take 1 tablet (4 mg total) by mouth every 6 (six) hours as needed for muscle spasms., Disp: 30 tablet, Rfl: 0  BP 134/81 mmHg  Pulse 71  Ht 5\' 3"  (1.6 m)  Wt 252 lb 8 oz (114.533 kg)  BMI 44.74 kg/m2  Physical  Exam  Constitutional: She is oriented to person, place, and time. She appears well-developed and well-nourished. No distress.  Cardiovascular: Normal rate and intact distal pulses.   Neurological: She is alert and oriented to person, place, and time. She has normal reflexes. She exhibits normal muscle tone. Coordination normal.  Skin: Skin is warm and dry. No rash noted. She is not diaphoretic. No erythema. No pallor.  Psychiatric: She has a normal mood and affect. Her behavior is normal. Judgment and thought content normal.    Ortho Exam Cervical spine exam tenderness on the right side of the cervical spine but not on the midline. Parenting her head to the right reproduces her symptoms on the right radiating to the right hand muscle tone is increased in the right trapezius muscle. Skin is normal. Range of motion restricted to the right lateral bend to the right but normal to the left she has some pain with extension of the neck  Right  arm no swelling normal range of motion shoulder elbow wrist reduced stable without laxity muscle strength normal and the right and left hand skin normal in the right upper extremity. Good pulse normal temperature in the right no lymph nodes palpable axillary or cervical region on the right and sensation remains normal she has 2+ reflexes right to left elbow and wrist area.  ASSESSMENT: My personal interpretation of the images:  X-rays show loss of cervical lordosis with some mild degenerative disc changes at C6 and 7 some anterior narrowing is noted there.  PLAN Recommend physical therapy and Meds ordered this encounter  Medications  . predniSONE (STERAPRED UNI-PAK 48 TAB) 10 MG (48) TBPK tablet    Sig: Take by mouth daily. 10 MG DS 12 DAYS    Dispense:  48 tablet    Refill:  1  . tiZANidine (ZANAFLEX) 4 MG tablet    Sig: Take 1 tablet (4 mg total) by mouth every 6 (six) hours as needed for muscle spasms.    Dispense:  30 tablet    Refill:  0   Upon return if no improvement cervical spine MRI warranted

## 2016-03-11 ENCOUNTER — Telehealth: Payer: Self-pay | Admitting: Orthopedic Surgery

## 2016-03-11 NOTE — Telephone Encounter (Signed)
Rhonda Patton called from PT in [place] to inform us that they have scheduled this patient several times and now they can't get in touch with her. They will continue to try, but just  wanted to give [place] a status report.

## 2016-04-14 ENCOUNTER — Ambulatory Visit (INDEPENDENT_AMBULATORY_CARE_PROVIDER_SITE_OTHER): Payer: BLUE CROSS/BLUE SHIELD | Admitting: Orthopedic Surgery

## 2016-04-14 VITALS — BP 141/87 | HR 72 | Ht 63.0 in | Wt 252.0 lb

## 2016-04-14 DIAGNOSIS — M65341 Trigger finger, right ring finger: Secondary | ICD-10-CM

## 2016-04-14 DIAGNOSIS — M5020 Other cervical disc displacement, unspecified cervical region: Secondary | ICD-10-CM | POA: Diagnosis not present

## 2016-04-14 MED ORDER — GABAPENTIN 100 MG PO CAPS: 100.0000 mg | ORAL_CAPSULE | Freq: Three times a day (TID) | ORAL | Status: DC

## 2016-04-14 NOTE — Progress Notes (Signed)
Chief Complaint  Patient presents with  . Follow-up    FOLLOW ON ON NECK PAIN   Prior history taken from my note on 03/03/2016 Pain in the right side of her neck that radiates down her arm and into her hand.    HPI 55 year old female presents with one-year history of cervical spine pain radiating to her right hand associated with burning aching pain numbness tingling stiffness and giving way symptoms in the right arm unrelieved by Tylenol ibuprofen worse with sleeping or use or overuse of the arm. Pain 8 out of 10 constant and sharp  No trauma  She reports frequent night sweats sore throat dental problems shortness of breath wheezing ankle leg swelling dizziness back pain swollen joints stiff joints joint pain but no problems with her walking.     After 6 weeks of physical therapy and prednisone diclofenac 75 mg twice a day and muscle relaxer of the patient's pain is worse now radiates across the neck and continues to radiate down the right arm into the right hand  She has a new complaint of locking and catching of the right long finger with pain over the A1 pulley  Past Medical History  Diagnosis Date  . Hypertension   . Hypercholesteremia   . Angina     occasional  . Sleep apnea     diagnosed but doesn't use CPAP  . Borderline diabetes   . GERD (gastroesophageal reflux disease)   . Anxiety   . Panic attack   . Arthritis   . Carpal tunnel syndrome of right wrist   . PONV (postoperative nausea and vomiting)    BP 141/87 mmHg  Pulse 72  Ht 5\' 3"  (1.6 m)  Wt 252 lb (114.306 kg)  BMI 44.65 kg/m2 Physical Exam  Constitutional: The patient is oriented to person, place, and time. The patient appears well-developed and well-nourished. No distress.  Cardiovascular: Intact distal pulses.   both hands  Neurological: The patient alert and oriented to person, place, and time. The patient exhibits normal muscle tone. Coordination normal.  Skin: Skin is warm and dry. No rash noted. The  patient is not diaphoretic. No erythema. No pallor.  Psychiatric: The patient has a normal mood and affect. Her behavior is normal. Judgment and thought content normal.  Gait is normal   Cervical exam shows tenderness on the midline of the cervical spine and also on the right side with some pain now and tenderness also on the left side. Rotational pain is noted rotating to the right. Increased muscle tone noted in the right trap. Skin over the cervical spine is normal she has poor lateral band and lateral rotation to the right  Right arm shows no swelling normal range of motion show shoulder elbow wrist and hand with all joints are reduced stable no laxity. Normal grip strength and normal wrist extension with weakness in the right biceps. Lymph nodes axilla and supraclavicular region are normal. Decreased sensation in C6 thumb index finger. Reflexes remain 2+ on the right and left elbow and wrist   Right hand Right long finger Tenderness over A1 pulley normal flexor tendon strength normal sensation and color right ring finger prior carpal tunnel incision over the skin  Left hand full flexion extension all digits no tenderness over any A1 pulley normal sensation and color left hand   Prior x-ray shows loss of cervical lordosis and arthritic changes at the C6-7 intervertebral space  Recommend MRI cervical spine herniated disc at C6 and 7  I'm also going to add some gabapentin 100 mg 3 times a day  Return after MRI    Trigger finger injection Right ring finger  Diagnosis  Trigger finger Procedure injection A1 pulley Medications lidocaine 1% 1 mL and Depo-Medrol 40 mg 1 mL Skin prep alcohol and ethyl chloride Verbal consent was obtained Timeout confirmed the injection site  After cleaning the skin with alcohol and anesthetizing the skin with ethyl chloride the A1 pulley was palpated and the injection was performed without complication

## 2016-04-14 NOTE — Patient Instructions (Addendum)
We will obtain pre-certification from the insurer and call you to schedule the study.  You have received an injection of steroids into the joint. 15% of patients will have increased pain within the 24 hours postinjection.   This is transient and will go away.   We recommend that you use ice packs on the injection site for 20 minutes every 2 hours and extra strength Tylenol 2 tablets every 8 as needed until the pain resolves.  If you continue to have pain after taking the Tylenol and using the ice please call the office for further instructions.  Trigger Finger Trigger finger (digital tendinitis and stenosing tenosynovitis) is a common disorder that causes an often painful catching of the fingers or thumb. It occurs as a clicking, snapping, or locking of a finger in the palm of the hand. This is caused by a problem with the tendons that flex or bend the fingers sliding smoothly through their sheaths. The condition may occur in any finger or a couple fingers at the same time.  The finger may lock with the finger curled or suddenly straighten out with a snap. This is more common in patients with rheumatoid arthritis and diabetes. Left untreated, the condition may get worse to the point where the finger becomes locked in flexion, like making a fist, or less commonly locked with the finger straightened out. CAUSES   Inflammation and scarring that lead to swelling around the tendon sheath.  Repeated or forceful movements.  Rheumatoid arthritis, an autoimmune disease that affects joints.  Gout.  Diabetes mellitus. SIGNS AND SYMPTOMS  Soreness and swelling of your finger.  A painful clicking or snapping as you bend and straighten your finger. DIAGNOSIS  Your health care provider will do a physical exam of your finger to diagnose trigger finger. TREATMENT   Splinting for 6-8 weeks may be helpful.  Nonsteroidal anti-inflammatory medicines (NSAIDs) can help to relieve the pain and  inflammation.  Cortisone injections, along with splinting, may speed up recovery. Several injections may be required. Cortisone may give relief after one injection.  Surgery is another treatment that may be used if conservative treatments do not work. Surgery can be minor, without incisions (a cut does not have to be made), and can be done with a needle through the skin.  Other surgical choices involve an open procedure in which the surgeon opens the hand through a small incision and cuts the pulley so the tendon can again slide smoothly. Your hand will still work fine. HOME CARE INSTRUCTIONS  Apply ice to the injured area, twice per day:  Put ice in a plastic bag.  Place a towel between your skin and the bag.  Leave the ice on for 20 minutes, 3-4 times a day.  Rest your hand often. MAKE SURE YOU:   Understand these instructions.  Will watch your condition.  Will get help right away if you are not doing well or get worse.   This information is not intended to replace advice given to you by your health care provider. Make sure you discuss any questions you have with your health care provider.   Document Released: 07/26/2004 Document Revised: 06/08/2013 Document Reviewed: 03/08/2013 Elsevier Interactive Patient Education [employer].

## 2016-04-15 ENCOUNTER — Ambulatory Visit: Payer: BLUE CROSS/BLUE SHIELD | Admitting: Urology

## 2016-04-15 ENCOUNTER — Telehealth: Payer: Self-pay | Admitting: *Deleted

## 2016-04-15 NOTE — Telephone Encounter (Signed)
[employer] REQUIRES PEER TO PEER REVIEW FOR MRI [phone]

## 2016-04-20 ENCOUNTER — Inpatient Hospital Stay: Admission: RE | Admit: 2016-04-20 | Payer: BLUE CROSS/BLUE SHIELD | Source: Ambulatory Visit

## 2016-04-24 ENCOUNTER — Telehealth: Payer: Self-pay | Admitting: *Deleted

## 2016-04-24 NOTE — Telephone Encounter (Signed)
Called patient to make her aware that her Insurance company denied her MRI. She stated she was going to call them.

## 2016-05-23 DIAGNOSIS — R0981 Nasal congestion: Secondary | ICD-10-CM | POA: Diagnosis not present

## 2016-05-23 DIAGNOSIS — R1030 Lower abdominal pain, unspecified: Secondary | ICD-10-CM | POA: Diagnosis not present

## 2016-05-23 DIAGNOSIS — M545 Low back pain: Secondary | ICD-10-CM | POA: Diagnosis not present

## 2016-05-23 DIAGNOSIS — I10 Essential (primary) hypertension: Secondary | ICD-10-CM | POA: Diagnosis not present

## 2016-05-23 DIAGNOSIS — R109 Unspecified abdominal pain: Secondary | ICD-10-CM | POA: Diagnosis not present

## 2016-09-01 DIAGNOSIS — Z01419 Encounter for gynecological examination (general) (routine) without abnormal findings: Secondary | ICD-10-CM | POA: Diagnosis not present

## 2016-09-01 DIAGNOSIS — Z6841 Body Mass Index (BMI) 40.0 and over, adult: Secondary | ICD-10-CM | POA: Diagnosis not present

## 2016-09-01 DIAGNOSIS — R300 Dysuria: Secondary | ICD-10-CM | POA: Diagnosis not present

## 2016-09-15 DIAGNOSIS — Z6841 Body Mass Index (BMI) 40.0 and over, adult: Secondary | ICD-10-CM | POA: Diagnosis not present

## 2016-09-15 DIAGNOSIS — E119 Type 2 diabetes mellitus without complications: Secondary | ICD-10-CM | POA: Diagnosis not present

## 2016-09-16 DIAGNOSIS — Z1231 Encounter for screening mammogram for malignant neoplasm of breast: Secondary | ICD-10-CM | POA: Diagnosis not present

## 2016-10-31 ENCOUNTER — Encounter: Payer: Self-pay | Admitting: Orthopedic Surgery

## 2016-10-31 ENCOUNTER — Ambulatory Visit (INDEPENDENT_AMBULATORY_CARE_PROVIDER_SITE_OTHER): Payer: BLUE CROSS/BLUE SHIELD | Admitting: Orthopedic Surgery

## 2016-10-31 DIAGNOSIS — M65341 Trigger finger, right ring finger: Secondary | ICD-10-CM

## 2016-10-31 NOTE — Progress Notes (Signed)
Patient ID: Rhonda Patton, female   DOB: [birth year], 56 y.o.   MRN: [id number]  Chief Complaint  Patient presents with  . Follow-up    RIGHT MIDDLE TRIGGER FINGER    HPI Rhonda Patton is a 56 y.o. female.   HPI  56 years old presents again for triggering of the right long finger with pain over the A1 pulley and stiffness in flexion  Review of Systems Review of Systems  No fever chills nausea diarrhea   Physical Exam  Tenderness over the A1 pulley on inspection with swelling and nodularity palpated in the distal portion of the palm with decreased flexion of the DIP and PIP joint but normal stability and strength of the flexor tendon skin is intact without rash distal pulses are normal at the radial artery color and capillary refill remain normal and sensation is intact   MEDICAL DECISION MAKING  DATA   None  DIAGNOSIS  Right long finger triggering  PLAN(RISK)    The patient would like to proceed with surgery and does not want any other nonoperative intervention  We will schedule her for right long finger trigger finger release

## 2016-10-31 NOTE — Patient Instructions (Signed)
TRIGGER FINGER RELEASE SURGERY 11/27/16

## 2016-11-03 ENCOUNTER — Other Ambulatory Visit: Payer: Self-pay | Admitting: *Deleted

## 2016-11-10 ENCOUNTER — Telehealth: Payer: Self-pay | Admitting: Orthopedic Surgery

## 2016-11-10 ENCOUNTER — Other Ambulatory Visit: Payer: Self-pay | Admitting: *Deleted

## 2016-11-10 MED ORDER — IBUPROFEN 800 MG PO TABS: 800.0000 mg | ORAL_TABLET | Freq: Three times a day (TID) | ORAL | 0 refills | Status: DC

## 2016-11-10 NOTE — Telephone Encounter (Signed)
IBUPROFEN 800 MG TID # 30

## 2016-11-10 NOTE — Telephone Encounter (Signed)
Medication sent to pharmacy, called patient, no answer

## 2016-11-10 NOTE — Telephone Encounter (Signed)
ROUTING TO DR HARRISON FOR APPROVAL 

## 2016-11-10 NOTE — Telephone Encounter (Signed)
Pt states she is scheduled for surgery and was wondering if Dr. Aline Brochure would prescribe her something for pain

## 2016-11-10 NOTE — Telephone Encounter (Signed)
Patient called back when she saw she had missed a call from our office. I informed her that her medication has been sent to her pharmacy.

## 2016-11-11 DIAGNOSIS — R7309 Other abnormal glucose: Secondary | ICD-10-CM | POA: Diagnosis not present

## 2016-11-11 DIAGNOSIS — Z6841 Body Mass Index (BMI) 40.0 and over, adult: Secondary | ICD-10-CM | POA: Diagnosis not present

## 2016-11-18 NOTE — Patient Instructions (Signed)
Rhonda Patton  11/18/2016     @PREFPERIOPPHARMACY @   Your procedure is scheduled on  11/27/2016   Report to [facility] at  1245  P.M.  Call this number if you have problems the morning of surgery:  [phone]   Remember:  Do not eat food or drink liquids after midnight.  Take these medicines the morning of surgery with A SIP OF WATER  Nirvasc, voltaren, neurontin, zantac, zanaflex. STOP taking your phentermine.   Do not wear jewelry, make-up or nail polish.  Do not wear lotions, powders, or perfumes, or deoderant.  Do not shave 48 hours prior to surgery.  Men may shave face and neck.  Do not bring valuables to the hospital.  [facility] is not responsible for any belongings or valuables.  Contacts, dentures or bridgework may not be worn into surgery.  Leave your suitcase in the car.  After surgery it may be brought to your room.  For patients admitted to the hospital, discharge time will be determined by your treatment team.  Patients discharged the day of surgery will not be allowed to drive home.   Name and phone number of your driver:   family Special instructions:  None  Please read over the following fact sheets that you were given. Anesthesia Post-op Instructions and Care and Recovery After Surgery      Trigger Finger Trigger finger (digital tendinitis and stenosing tenosynovitis) is a common disorder that causes an often painful catching of the fingers or thumb. It occurs as a clicking, snapping, or locking of a finger in the palm of the hand. This is caused by a problem with the tendons that flex or bend the fingers sliding smoothly through their sheaths. The condition may occur in any finger or a couple fingers at the same time.  The finger may lock with the finger curled or suddenly straighten out with a snap. This is more common in patients with rheumatoid arthritis and diabetes. Left untreated, the condition may get worse to the point where the finger  becomes locked in flexion, like making a fist, or less commonly locked with the finger straightened out. CAUSES   Inflammation and scarring that lead to swelling around the tendon sheath.  Repeated or forceful movements.  Rheumatoid arthritis, an autoimmune disease that affects joints.  Gout.  Diabetes mellitus. SIGNS AND SYMPTOMS  Soreness and swelling of your finger.  A painful clicking or snapping as you bend and straighten your finger. DIAGNOSIS  Your health care provider will do a physical exam of your finger to diagnose trigger finger. TREATMENT   Splinting for 6-8 weeks may be helpful.  Nonsteroidal anti-inflammatory medicines (NSAIDs) can help to relieve the pain and inflammation.  Cortisone injections, along with splinting, may speed up recovery. Several injections may be required. Cortisone may give relief after one injection.  Surgery is another treatment that may be used if conservative treatments do not work. Surgery can be minor, without incisions (a cut does not have to be made), and can be done with a needle through the skin.  Other surgical choices involve an open procedure in which the surgeon opens the hand through a small incision and cuts the pulley so the tendon can again slide smoothly. Your hand will still work fine. HOME CARE INSTRUCTIONS  Apply ice to the injured area, twice per day:  Put ice in a plastic bag.  Place a towel between your skin and the bag.  Leave the ice on for 20 minutes, 3-4 times a day.  Rest your hand often. MAKE SURE YOU:   Understand these instructions.  Will watch your condition.  Will get help right away if you are not doing well or get worse. This information is not intended to replace advice given to you by your health care provider. Make sure you discuss any questions you have with your health care provider. Document Released: 07/26/2004 Document Revised: 06/08/2013 Document Reviewed: 03/08/2013 Elsevier  Interactive Patient Education  2017 [employer] PATIENT INSTRUCTIONS POST-ANESTHESIA  IMMEDIATELY FOLLOWING SURGERY:  Do not drive or operate machinery for the first twenty four hours after surgery.  Do not make any important decisions for twenty four hours after surgery or while taking narcotic pain medications or sedatives.  If you develop intractable nausea and vomiting or a severe headache please notify your doctor immediately.  FOLLOW-UP:  Please make an appointment with your surgeon as instructed. You do not need to follow up with anesthesia unless specifically instructed to do so.  WOUND CARE INSTRUCTIONS (if applicable):  Keep a dry clean dressing on the anesthesia/puncture wound site if there is drainage.  Once the wound has quit draining you may leave it open to air.  Generally you should leave the bandage intact for twenty four hours unless there is drainage.  If the epidural site drains for more than 36-48 hours please call the anesthesia department.  QUESTIONS?:  Please feel free to call your physician or the hospital operator if you have any questions, and they will be happy to assist you.

## 2016-11-20 ENCOUNTER — Encounter: Payer: Self-pay | Admitting: *Deleted

## 2016-11-20 ENCOUNTER — Encounter (HOSPITAL_COMMUNITY): Payer: Self-pay

## 2016-11-20 ENCOUNTER — Encounter (HOSPITAL_COMMUNITY)
Admission: RE | Admit: 2016-11-20 | Discharge: 2016-11-20 | Disposition: A | Discharge status: Home / Self Care | Payer: BLUE CROSS/BLUE SHIELD | Source: Ambulatory Visit | Attending: Orthopedic Surgery | Admitting: Orthopedic Surgery

## 2016-11-20 DIAGNOSIS — M65331 Trigger finger, right middle finger: Secondary | ICD-10-CM | POA: Insufficient documentation

## 2016-11-20 DIAGNOSIS — Z01818 Encounter for other preprocedural examination: Secondary | ICD-10-CM | POA: Diagnosis not present

## 2016-11-20 DIAGNOSIS — Z01812 Encounter for preprocedural laboratory examination: Secondary | ICD-10-CM | POA: Diagnosis not present

## 2016-11-20 DIAGNOSIS — R9431 Abnormal electrocardiogram [ECG] [EKG]: Secondary | ICD-10-CM | POA: Insufficient documentation

## 2016-11-20 LAB — CBC WITH DIFFERENTIAL/PLATELET
Basophils Absolute: 0 10*3/uL (ref 0.0–0.1)
Basophils Relative: 1 %
EOS ABS: 0.2 10*3/uL (ref 0.0–0.7)
EOS PCT: 4 %
HCT: 40.3 % (ref 36.0–46.0)
Hemoglobin: 13.8 g/dL (ref 12.0–15.0)
LYMPHS ABS: 1.5 10*3/uL (ref 0.7–4.0)
LYMPHS PCT: 38 %
MCH: 28.8 pg (ref 26.0–34.0)
MCHC: 34.2 g/dL (ref 30.0–36.0)
MCV: 84.1 fL (ref 78.0–100.0)
Monocytes Absolute: 0.5 10*3/uL (ref 0.1–1.0)
Monocytes Relative: 12 %
Neutro Abs: 1.8 10*3/uL (ref 1.7–7.7)
Neutrophils Relative %: 45 %
PLATELETS: 191 10*3/uL (ref 150–400)
RBC: 4.79 MIL/uL (ref 3.87–5.11)
RDW: 14 % (ref 11.5–15.5)
WBC: 4 10*3/uL (ref 4.0–10.5)

## 2016-11-20 LAB — BASIC METABOLIC PANEL
Anion gap: 6 (ref 5–15)
BUN: 10 mg/dL (ref 6–20)
CO2: 23 mmol/L (ref 22–32)
CREATININE: 1.04 mg/dL — AB (ref 0.44–1.00)
Calcium: 9 mg/dL (ref 8.9–10.3)
Chloride: 107 mmol/L (ref 101–111)
GFR calc Af Amer: >60 mL/min (ref >60)
GFR calc non Af Amer: 58 mL/min — ABNORMAL LOW (ref >60)
GLUCOSE: 89 mg/dL (ref 65–99)
Potassium: 3.8 mmol/L (ref 3.5–5.1)
Sodium: 136 mmol/L (ref 135–145)

## 2016-11-20 NOTE — Progress Notes (Signed)
Per patient plan surgical pre authorization not required for CPT 26055 outpatient service  Reference Saquon H 11/20/16 2:54pm

## 2016-11-26 NOTE — H&P (Signed)
History and physical for outpatient surgical procedure  Chief complaint catching locking right long finger  56 year old female presented with moderate aching pain over the A1 pulley of the right long finger associated with catching locking and pain did not respond to injection. She presents for surgical release   Review of Systems  Constitutional: Negative for malaise/fatigue.  Respiratory: Negative for shortness of breath.   Cardiovascular: Negative for chest pain.  Musculoskeletal: Positive for myalgias.  All other systems reviewed and are negative.   Past Surgical History:  Procedure Laterality Date  . ABDOMINAL HYSTERECTOMY    . CESAREAN SECTION     x 3  . CHOLECYSTECTOMY    . FOOT SURGERY Right    x 2-bone spur  . HAND SURGERY Right    x 2-carpal tunnel  . KNEE ARTHROSCOPY Left   . PARTIAL HYSTERECTOMY  2001   total abdominal   Past Medical History:  Diagnosis Date  . Angina    occasional  . Anxiety   . Arthritis   . Borderline diabetes   . Carpal tunnel syndrome of right wrist   . GERD (gastroesophageal reflux disease)   . Hypercholesteremia   . Hypertension   . Panic attack   . PONV (postoperative nausea and vomiting)   . Sleep apnea    diagnosed but doesn't use CPAP   Family History  Problem Relation Age of Onset  . Heart disease    . Arthritis    . Cancer    . Diabetes     Social History  Substance Use Topics  . Smoking status: Never Smoker  . Smokeless tobacco: Never Used  . Alcohol use No    Physical Exam  Constitutional: She appears well-developed and well-nourished.  Non-toxic appearance. No distress.  Eyes: Conjunctivae, EOM and lids are normal. Right eye exhibits no discharge and no exudate. Left eye exhibits no discharge and no exudate. Right conjunctiva is not injected. Left conjunctiva is not injected.  Neck: Trachea normal, normal range of motion and full passive range of motion without pain. Neck supple. No neck rigidity. Normal  range of motion present. No thyroid mass and no thyromegaly present.  Cardiovascular: Intact distal pulses.   Pulses:      Radial pulses are 2+ on the right side, and 2+ on the left side.       Dorsalis pedis pulses are 2+ on the right side, and 2+ on the left side.  Lymphadenopathy:       Right cervical: No superficial cervical adenopathy present.      Left cervical: No superficial cervical adenopathy present.    She has no axillary adenopathy.       Right: No supraclavicular adenopathy present.       Left: No supraclavicular adenopathy present.  Neurological: She is alert. She displays no atrophy and no tremor. No sensory deficit. She exhibits normal muscle tone. Gait normal. She displays no Babinski's sign on the right side. She displays no Babinski's sign on the left side.  Reflex Scores:      Bicep reflexes are 2+ on the right side and 2+ on the left side.      Patellar reflexes are 2+ on the right side and 2+ on the left side. Skin: Skin is warm, dry and intact. No abrasion, no bruising, no ecchymosis and no laceration noted. Rash is not nodular. She is not diaphoretic. No erythema.  Psychiatric: She has a normal mood and affect. Her speech is normal and behavior is  normal. Judgment and thought content normal. She is attentive.  Vitals reviewed.   Physical Exam   Tenderness over the A1 pulley on inspection with swelling and nodularity palpated in the distal portion of the palm with decreased flexion of the DIP and PIP joint but normal stability and strength of the flexor tendon skin is intact without rash distal pulses are normal at the radial artery color and capillary refill remain normal and sensation is intact   Diagnoses right long finger tenosynovitis with triggering   Plan right long finger trigger finger release

## 2016-11-27 ENCOUNTER — Ambulatory Visit (HOSPITAL_COMMUNITY): Payer: BLUE CROSS/BLUE SHIELD | Admitting: Anesthesiology

## 2016-11-27 ENCOUNTER — Ambulatory Visit (HOSPITAL_COMMUNITY)
Admission: RE | Admit: 2016-11-27 | Discharge: 2016-11-27 | Disposition: A | Discharge status: Home / Self Care | Payer: BLUE CROSS/BLUE SHIELD | Source: Ambulatory Visit | Attending: Orthopedic Surgery | Admitting: Orthopedic Surgery

## 2016-11-27 ENCOUNTER — Encounter (HOSPITAL_COMMUNITY): Payer: Self-pay

## 2016-11-27 ENCOUNTER — Encounter (HOSPITAL_COMMUNITY)
Admission: RE | Disposition: A | Discharge status: Home / Self Care | Payer: Self-pay | Source: Ambulatory Visit | Attending: Orthopedic Surgery

## 2016-11-27 DIAGNOSIS — M6530 Trigger finger, unspecified finger: Secondary | ICD-10-CM

## 2016-11-27 DIAGNOSIS — K219 Gastro-esophageal reflux disease without esophagitis: Secondary | ICD-10-CM | POA: Insufficient documentation

## 2016-11-27 DIAGNOSIS — Z791 Long term (current) use of non-steroidal anti-inflammatories (NSAID): Secondary | ICD-10-CM | POA: Diagnosis not present

## 2016-11-27 DIAGNOSIS — G4730 Sleep apnea, unspecified: Secondary | ICD-10-CM | POA: Insufficient documentation

## 2016-11-27 DIAGNOSIS — I10 Essential (primary) hypertension: Secondary | ICD-10-CM | POA: Insufficient documentation

## 2016-11-27 DIAGNOSIS — Z79890 Hormone replacement therapy (postmenopausal): Secondary | ICD-10-CM | POA: Diagnosis not present

## 2016-11-27 DIAGNOSIS — Z79899 Other long term (current) drug therapy: Secondary | ICD-10-CM | POA: Diagnosis not present

## 2016-11-27 DIAGNOSIS — M79644 Pain in right finger(s): Secondary | ICD-10-CM | POA: Diagnosis not present

## 2016-11-27 DIAGNOSIS — E7800 Pure hypercholesterolemia, unspecified: Secondary | ICD-10-CM | POA: Diagnosis not present

## 2016-11-27 DIAGNOSIS — M65331 Trigger finger, right middle finger: Secondary | ICD-10-CM | POA: Diagnosis not present

## 2016-11-27 HISTORY — PX: TRIGGER FINGER RELEASE: SHX641

## 2016-11-27 LAB — GLUCOSE, CAPILLARY
GLUCOSE-CAPILLARY: 98 mg/dL (ref 65–99)
Glucose-Capillary: 89 mg/dL (ref 65–99)

## 2016-11-27 SURGERY — RELEASE, A1 PULLEY, FOR TRIGGER FINGER
Anesthesia: Regional | Laterality: Right

## 2016-11-27 MED ORDER — FENTANYL CITRATE (PF) 100 MCG/2ML IJ SOLN
25.0000 ug | INTRAMUSCULAR | Status: AC
  Administered 2016-11-27: 25 ug via INTRAVENOUS

## 2016-11-27 MED ORDER — FENTANYL CITRATE (PF) 100 MCG/2ML IJ SOLN
INTRAMUSCULAR | Status: AC
  Filled 2016-11-27: qty 2

## 2016-11-27 MED ORDER — MIDAZOLAM HCL 2 MG/2ML IJ SOLN
INTRAMUSCULAR | Status: AC
  Filled 2016-11-27: qty 2

## 2016-11-27 MED ORDER — SODIUM CHLORIDE 0.9 % IJ SOLN
INTRAMUSCULAR | Status: AC
  Filled 2016-11-27: qty 10

## 2016-11-27 MED ORDER — PROPOFOL 10 MG/ML IV BOLUS
INTRAVENOUS | Status: AC
  Filled 2016-11-27: qty 20

## 2016-11-27 MED ORDER — LIDOCAINE HCL (PF) 0.5 % IJ SOLN
INTRAMUSCULAR | Status: AC
  Filled 2016-11-27: qty 50

## 2016-11-27 MED ORDER — LACTATED RINGERS IV SOLN
INTRAVENOUS | Status: DC
  Administered 2016-11-27: 10:00:00 via INTRAVENOUS

## 2016-11-27 MED ORDER — MIDAZOLAM HCL 5 MG/5ML IJ SOLN
INTRAMUSCULAR | Status: DC | PRN
  Administered 2016-11-27: 2 mg via INTRAVENOUS

## 2016-11-27 MED ORDER — BUPIVACAINE HCL (PF) 0.5 % IJ SOLN
INTRAMUSCULAR | Status: AC
  Filled 2016-11-27: qty 30

## 2016-11-27 MED ORDER — LIDOCAINE HCL (PF) 0.5 % IJ SOLN
INTRAMUSCULAR | Status: DC | PRN
  Administered 2016-11-27: 250 mg via INTRAVENOUS

## 2016-11-27 MED ORDER — CEFAZOLIN SODIUM-DEXTROSE 2-4 GM/100ML-% IV SOLN
2.0000 g | INTRAVENOUS | Status: DC
  Filled 2016-11-27: qty 100

## 2016-11-27 MED ORDER — HYDROCODONE-ACETAMINOPHEN 5-325 MG PO TABS: 1.0000 | ORAL_TABLET | ORAL | 0 refills | Status: DC | PRN

## 2016-11-27 MED ORDER — DEXAMETHASONE SODIUM PHOSPHATE 4 MG/ML IJ SOLN
4.0000 mg | INTRAMUSCULAR | Status: AC
  Administered 2016-11-27: 4 mg via INTRAVENOUS

## 2016-11-27 MED ORDER — BUPIVACAINE HCL (PF) 0.5 % IJ SOLN
INTRAMUSCULAR | Status: DC | PRN
  Administered 2016-11-27: 10 mL

## 2016-11-27 MED ORDER — FENTANYL CITRATE (PF) 100 MCG/2ML IJ SOLN
25.0000 ug | INTRAMUSCULAR | Status: DC | PRN
  Administered 2016-11-27: 25 ug via INTRAVENOUS

## 2016-11-27 MED ORDER — MIDAZOLAM HCL 2 MG/2ML IJ SOLN
1.0000 mg | INTRAMUSCULAR | Status: AC
  Administered 2016-11-27: 2 mg via INTRAVENOUS

## 2016-11-27 MED ORDER — DEXAMETHASONE SODIUM PHOSPHATE 4 MG/ML IJ SOLN
INTRAMUSCULAR | Status: AC
  Filled 2016-11-27: qty 1

## 2016-11-27 MED ORDER — FENTANYL CITRATE (PF) 100 MCG/2ML IJ SOLN
INTRAMUSCULAR | Status: DC | PRN
  Administered 2016-11-27: 50 ug via INTRAVENOUS

## 2016-11-27 MED ORDER — PROPOFOL 500 MG/50ML IV EMUL
INTRAVENOUS | Status: DC | PRN
  Administered 2016-11-27: 35 ug/kg/min via INTRAVENOUS

## 2016-11-27 MED ORDER — ONDANSETRON HCL 4 MG/2ML IJ SOLN
INTRAMUSCULAR | Status: AC
  Filled 2016-11-27: qty 2

## 2016-11-27 MED ORDER — CHLORHEXIDINE GLUCONATE 4 % EX LIQD: 60.0000 mL | Freq: Once | CUTANEOUS | Status: DC

## 2016-11-27 MED ORDER — SODIUM CHLORIDE 0.9 % IR SOLN
Status: DC | PRN
  Administered 2016-11-27: 1000 mL

## 2016-11-27 MED ORDER — EPHEDRINE SULFATE 50 MG/ML IJ SOLN
INTRAMUSCULAR | Status: AC
  Filled 2016-11-27: qty 1

## 2016-11-27 MED ORDER — ONDANSETRON HCL 4 MG/2ML IJ SOLN
4.0000 mg | Freq: Once | INTRAMUSCULAR | Status: AC
  Administered 2016-11-27: 4 mg via INTRAVENOUS

## 2016-11-27 SURGICAL SUPPLY — 38 items
BAG HAMPER (MISCELLANEOUS) ×2 IMPLANT
BANDAGE ELASTIC 2 LF NS (GAUZE/BANDAGES/DRESSINGS) ×2 IMPLANT
BANDAGE ESMARK 4X12 BL STRL LF (DISPOSABLE) IMPLANT
BLADE SURG 15 STRL LF DISP TIS (BLADE) ×1 IMPLANT
BLADE SURG 15 STRL SS (BLADE) ×1
BNDG CONFORM 2 STRL LF (GAUZE/BANDAGES/DRESSINGS) ×2 IMPLANT
BNDG ELASTIC 2 VLCR STRL LF (GAUZE/BANDAGES/DRESSINGS) ×2 IMPLANT
BNDG ESMARK 4X12 BLUE STRL LF (DISPOSABLE)
CHLORAPREP W/TINT 26ML (MISCELLANEOUS) ×2 IMPLANT
CLOTH BEACON ORANGE TIMEOUT ST (SAFETY) ×2 IMPLANT
COVER LIGHT HANDLE STERIS (MISCELLANEOUS) ×4 IMPLANT
CUFF TOURNIQUET SINGLE 18IN (TOURNIQUET CUFF) IMPLANT
CUFF TOURNIQUET SINGLE 24IN (TOURNIQUET CUFF) ×2 IMPLANT
DECANTER SPIKE VIAL GLASS SM (MISCELLANEOUS) ×2 IMPLANT
DRSG XEROFORM 1X8 (GAUZE/BANDAGES/DRESSINGS) ×2 IMPLANT
ELECT NEEDLE TIP 2.8 STRL (NEEDLE) ×2 IMPLANT
ELECT REM PT RETURN 9FT ADLT (ELECTROSURGICAL) ×2
ELECTRODE REM PT RTRN 9FT ADLT (ELECTROSURGICAL) ×1 IMPLANT
GAUZE XEROFORM 5X9 LF (GAUZE/BANDAGES/DRESSINGS) ×2 IMPLANT
GLOVE BIOGEL PI IND STRL 7.0 (GLOVE) ×1 IMPLANT
GLOVE BIOGEL PI INDICATOR 7.0 (GLOVE) ×1
GLOVE SKINSENSE NS SZ8.0 LF (GLOVE) ×1
GLOVE SKINSENSE STRL SZ8.0 LF (GLOVE) ×1 IMPLANT
GLOVE SS N UNI LF 8.5 STRL (GLOVE) ×2 IMPLANT
GOWN STRL REUS W/ TWL LRG LVL3 (GOWN DISPOSABLE) ×1 IMPLANT
GOWN STRL REUS W/TWL LRG LVL3 (GOWN DISPOSABLE) ×5 IMPLANT
GOWN STRL REUS W/TWL XL LVL3 (GOWN DISPOSABLE) ×2 IMPLANT
HAND ALUMI XLG (SOFTGOODS) ×2 IMPLANT
KIT ROOM TURNOVER APOR (KITS) ×2 IMPLANT
MANIFOLD NEPTUNE II (INSTRUMENTS) ×2 IMPLANT
NEEDLE HYPO 21X1.5 SAFETY (NEEDLE) IMPLANT
NS IRRIG 1000ML POUR BTL (IV SOLUTION) ×2 IMPLANT
PACK BASIC LIMB (CUSTOM PROCEDURE TRAY) ×2 IMPLANT
PAD ARMBOARD 7.5X6 YLW CONV (MISCELLANEOUS) ×2 IMPLANT
SET BASIN LINEN APH (SET/KITS/TRAYS/PACK) ×2 IMPLANT
SPONGE GAUZE 2X2 8PLY STRL LF (GAUZE/BANDAGES/DRESSINGS) ×2 IMPLANT
SUT ETHILON 3 0 FSL (SUTURE) ×2 IMPLANT
SYR CONTROL 10ML LL (SYRINGE) IMPLANT

## 2016-11-27 NOTE — Transfer of Care (Signed)
Immediate Anesthesia Transfer of Care Note  Patient: Rhonda Patton  Procedure(s) Performed: Procedure(s) with comments: RELEASE TRIGGER FINGER/A-1 PULLEY RIGHT LONG FINGER (Right) - pt knows to arrive at 8:45  Patient Location: PACU  Anesthesia Type:Bier block  Level of Consciousness: awake and patient cooperative  Airway & Oxygen Therapy: Patient Spontanous Breathing and Patient connected to nasal cannula oxygen  Post-op Assessment: Report given to RN, Post -op Vital signs reviewed and stable and Patient moving all extremities  Post vital signs: Reviewed and stable  Last Vitals:  Vitals:   11/27/16 0935 11/27/16 0940  BP: (!) 179/84 (!) 177/91  Pulse:    Resp: 19 14  Temp:      Last Pain:  Vitals:   11/27/16 0906  TempSrc: Oral  PainSc: 7       Patients Stated Pain Goal: 7 ([id number])  Complications: No apparent anesthesia complications

## 2016-11-27 NOTE — Interval H&P Note (Signed)
History and Physical Interval Note:  11/27/2016 9:32 AM BP (!) 166/78   Pulse 62   Temp 97.7 F (36.5 C) (Oral)   Resp 14   Ht 5\' 3"  (1.6 m)   Wt 246 lb (111.6 kg)   LMP  (Within Years) Comment: 32 years ago  SpO2 97%   BMI 43.58 kg/m   Rhonda Patton  has presented today for surgery, with the diagnosis of RIGHT LONG TRIGGER FINGER  The various methods of treatment have been discussed with the patient and family. After consideration of risks, benefits and other options for treatment, the patient has consented to  Procedure(s) with comments: RELEASE TRIGGER FINGER/A-1 PULLEY RIGHT LONG FINGER (Right) - pt knows to arrive at 8:45 as a surgical intervention .  The patient's history has been reviewed, patient examined, no change in status, stable for surgery.  I have reviewed the patient's chart and labs.  Questions were answered to the patient's satisfaction.     [name]

## 2016-11-27 NOTE — Anesthesia Postprocedure Evaluation (Signed)
Anesthesia Post Note  Patient: Rhonda Patton  Procedure(s) Performed: Procedure(s) (LRB): RELEASE TRIGGER FINGER/A-1 PULLEY RIGHT LONG FINGER (Right)  Patient location during evaluation: PACU Anesthesia Type: Bier Block Level of consciousness: awake and alert, patient cooperative and oriented Pain management: pain level controlled Vital Signs Assessment: post-procedure vital signs reviewed and stable Respiratory status: spontaneous breathing, nonlabored ventilation and respiratory function stable Cardiovascular status: blood pressure returned to baseline Postop Assessment: no signs of nausea or vomiting Anesthetic complications: no     Last Vitals:  Vitals:   11/27/16 0935 11/27/16 0940  BP: (!) 179/84 (!) 177/91  Pulse:    Resp: 19 14  Temp:      Last Pain:  Vitals:   11/27/16 0906  TempSrc: Oral  PainSc: 7                  Patricio Popwell J

## 2016-11-27 NOTE — Discharge Instructions (Addendum)
Dr will change dressing

## 2016-11-27 NOTE — Brief Op Note (Signed)
11/27/2016  10:26 AM  PATIENT:  Rhonda Patton  56 y.o. female  PRE-OPERATIVE DIAGNOSIS:  RIGHT LONG TRIGGER FINGER  POST-OPERATIVE DIAGNOSIS:  Right long finger trigger finger PROCEDURE:  Procedure(s) with comments: RELEASE TRIGGER FINGER/A-1 PULLEY RIGHT LONG FINGER (Right) - pt knows to arrive at 8:45  Procedure right long finger trigger release  Findings stenotic thickened A1 pulley  Details of procedure  The patient was identified in the preop area the chart was reviewed and updated she was taken to surgery she had Ancef for antibiotic. She had a Bier block. She was placed supine with an arm table and the right hand extended out over the table  After sterile prep and drape and timeout we made a transverse incision over the right long finger A1 pulley. Bluntly dissected to protect the neurovascular bundles we passed a blunt object underneath the A1 pulley and then released it with a sharp scissors  Flexion extension of the finger confirmed release  The wound was irrigated and closed with 3-0 nylon interrupted suture  We injected a total of 10 mL of Marcaine plain  Sterile dressing applied  Tourniquet release  Patient taken recovery in stable condition   SURGEON:  Surgeon(s) and Role:    * Carole Civil, MD - Primary  PHYSICIAN ASSISTANT:   ASSISTANTS: Simonne Maffucci   ANESTHESIA:   regional  EBL:  Total I/O In: 400 [I.V.:400] Out: -   BLOOD ADMINISTERED:none  DRAINS: none   LOCAL MEDICATIONS USED:  MARCAINE     SPECIMEN:  No Specimen  DISPOSITION OF SPECIMEN:  N/A  COUNTS:  YES  TOURNIQUET:    DICTATION: .Dragon Dictation  PLAN OF CARE: Discharge to home after PACU  PATIENT DISPOSITION:  PACU - hemodynamically stable.   Delay start of Pharmacological VTE agent (>24hrs) due to surgical blood loss or risk of bleeding: not applicable

## 2016-11-27 NOTE — Op Note (Signed)
11/27/2016  10:26 AM  PATIENT:  Rhonda Patton  56 y.o. female  PRE-OPERATIVE DIAGNOSIS:  RIGHT LONG TRIGGER FINGER  POST-OPERATIVE DIAGNOSIS:  Right long finger trigger finger PROCEDURE:  Procedure(s) with comments: RELEASE TRIGGER FINGER/A-1 PULLEY RIGHT LONG FINGER (Right) - pt knows to arrive at 8:45  Procedure right long finger trigger release  Findings stenotic thickened A1 pulley  Details of procedure  The patient was identified in the preop area the chart was reviewed and updated she was taken to surgery she had Ancef for antibiotic. She had a Bier block. She was placed supine with an arm table and the right hand extended out over the table  After sterile prep and drape and timeout we made a transverse incision over the right long finger A1 pulley. Bluntly dissected to protect the neurovascular bundles we passed a blunt object underneath the A1 pulley and then released it with a sharp scissors  Flexion extension of the finger confirmed release  The wound was irrigated and closed with 3-0 nylon interrupted suture  We injected a total of 10 mL of Marcaine plain  Sterile dressing applied  Tourniquet release  Patient taken recovery in stable condition   SURGEON:  Surgeon(s) and Role:    * Carole Civil, MD - Primary  PHYSICIAN ASSISTANT:   ASSISTANTS: Simonne Maffucci   ANESTHESIA:   regional  EBL:  Total I/O In: 400 [I.V.:400] Out: -   BLOOD ADMINISTERED:none  DRAINS: none   LOCAL MEDICATIONS USED:  MARCAINE     SPECIMEN:  No Specimen  DISPOSITION OF SPECIMEN:  N/A  COUNTS:  YES  TOURNIQUET:    DICTATION: .Dragon Dictation  PLAN OF CARE: Discharge to home after PACU  PATIENT DISPOSITION:  PACU - hemodynamically stable.   Delay start of Pharmacological VTE agent (>24hrs) due to surgical blood loss or risk of bleeding: not applicable

## 2016-11-27 NOTE — Anesthesia Procedure Notes (Signed)
Anesthesia Regional Block:  Bier block (IV Regional)  Pre-Anesthetic Checklist: ,, timeout performed, Correct Patient, Correct Site, Correct Laterality, Correct Procedure,, site marked, surgical consent, pre-op evaluation, at surgeon's request Needles:  Injection technique: Single-shot  Needle Type: Other      Needle Gauge: 20 and 20 G    Additional Needles:  Motor weakness within 2 minutes. Bier block (IV Regional)  Nerve Stimulator or Paresthesia:   Additional Responses:  Pulse checked post tourniquet inflation. IV NSL discontinued post injection. Narrative:  Start time: 11/27/2016 10:01 AM  Performed by: Personally

## 2016-11-27 NOTE — Anesthesia Preprocedure Evaluation (Signed)
Anesthesia Evaluation  Patient identified by MRN, date of birth, ID band Patient awake    Reviewed: Allergy & Precautions, H&P , NPO status , Patient's Chart, lab work & pertinent test results  History of Anesthesia Complications (+) PONV and history of anesthetic complications  Airway Mallampati: I       Dental  (+) Teeth Intact   Pulmonary shortness of breath and with exertion, sleep apnea ,    breath sounds clear to auscultation       Cardiovascular hypertension, Pt. on medications + angina + Valvular Problems/Murmurs AI and MR  Rhythm:Regular Rate:Normal     Neuro/Psych Anxiety  Neuromuscular disease    GI/Hepatic GERD  Medicated and Controlled,  Endo/Other    Renal/GU      Musculoskeletal   Abdominal   Peds  Hematology   Anesthesia Other Findings   Reproductive/Obstetrics                             Anesthesia Physical Anesthesia Plan  ASA: III  Anesthesia Plan: Bier Block   Post-op Pain Management:    Induction: Intravenous  Airway Management Planned: Simple Face Mask  Additional Equipment:   Intra-op Plan:   Post-operative Plan:   Informed Consent: I have reviewed the patients History and Physical, chart, labs and discussed the procedure including the risks, benefits and alternatives for the proposed anesthesia with the patient or authorized representative who has indicated his/her understanding and acceptance.     Plan Discussed with:   Anesthesia Plan Comments:         Anesthesia Quick Evaluation

## 2016-11-28 ENCOUNTER — Encounter: Payer: Self-pay | Admitting: Orthopedic Surgery

## 2016-11-28 ENCOUNTER — Ambulatory Visit (INDEPENDENT_AMBULATORY_CARE_PROVIDER_SITE_OTHER): Payer: BLUE CROSS/BLUE SHIELD | Admitting: Orthopedic Surgery

## 2016-11-28 DIAGNOSIS — Z4889 Encounter for other specified surgical aftercare: Secondary | ICD-10-CM

## 2016-11-28 DIAGNOSIS — M65341 Trigger finger, right ring finger: Secondary | ICD-10-CM

## 2016-11-28 NOTE — Progress Notes (Signed)
Postop visit #1 status post trigger release right long finger  Patient came in for dressing change.  I gave her instructions on active range of motion she'll come back on the 22nd for suture removal by the nurse and then she can follow-up with me 4 weeks after that  Encounter Diagnoses  Name Primary?  . Trigger ring finger of right hand Yes  . Aftercare following surgery

## 2016-12-01 ENCOUNTER — Encounter (HOSPITAL_COMMUNITY): Payer: Self-pay | Admitting: Orthopedic Surgery

## 2016-12-11 ENCOUNTER — Ambulatory Visit (INDEPENDENT_AMBULATORY_CARE_PROVIDER_SITE_OTHER): Payer: BLUE CROSS/BLUE SHIELD | Admitting: Orthopedic Surgery

## 2016-12-11 ENCOUNTER — Telehealth: Payer: Self-pay | Admitting: Orthopedic Surgery

## 2016-12-11 DIAGNOSIS — Z4889 Encounter for other specified surgical aftercare: Secondary | ICD-10-CM

## 2016-12-11 NOTE — Progress Notes (Signed)
Patient in today for suture removal from right long trigger finger release, DOS 11/27/16. Wound cleaned and sutures removed, patient tolerated well. Band aid applied. Advised 1 month follow up .

## 2016-12-11 NOTE — Telephone Encounter (Signed)
Patient here for nurse visit today, 12/11/16; requests refill:  HYDROcodone-acetaminophen (NORCO/VICODIN) 5-325 MG tablet 30 tablet

## 2016-12-11 NOTE — Telephone Encounter (Signed)
Routing to Dr Harrison for approval 

## 2016-12-12 ENCOUNTER — Ambulatory Visit: Payer: BLUE CROSS/BLUE SHIELD | Admitting: Orthopedic Surgery

## 2016-12-12 ENCOUNTER — Other Ambulatory Visit: Payer: Self-pay | Admitting: Orthopedic Surgery

## 2016-12-12 MED ORDER — HYDROCODONE-ACETAMINOPHEN 5-325 MG PO TABS: 1.0000 | ORAL_TABLET | Freq: Four times a day (QID) | ORAL | 0 refills | Status: DC | PRN

## 2016-12-12 NOTE — Progress Notes (Signed)
controlled substance reporting system reviewed  

## 2016-12-15 DIAGNOSIS — Z713 Dietary counseling and surveillance: Secondary | ICD-10-CM | POA: Diagnosis not present

## 2016-12-15 DIAGNOSIS — Z6841 Body Mass Index (BMI) 40.0 and over, adult: Secondary | ICD-10-CM | POA: Diagnosis not present

## 2017-01-01 ENCOUNTER — Telehealth: Payer: Self-pay | Admitting: Orthopedic Surgery

## 2017-01-01 MED ORDER — HYDROCODONE-ACETAMINOPHEN 5-325 MG PO TABS: 1.0000 | ORAL_TABLET | Freq: Four times a day (QID) | ORAL | 0 refills | Status: DC | PRN

## 2017-01-01 NOTE — Telephone Encounter (Signed)
Hydrocodone-Acetaminophen  5/325mg   Qty 20 Tablets  Take 1 tablet by mouth every 6 (six) hours as needed for moderate pain.

## 2017-01-01 NOTE — Telephone Encounter (Signed)
ROUTING TO DR Luna Glasgow FOR APPROVAL

## 2017-01-09 ENCOUNTER — Encounter: Payer: Self-pay | Admitting: Orthopedic Surgery

## 2017-01-09 ENCOUNTER — Ambulatory Visit (INDEPENDENT_AMBULATORY_CARE_PROVIDER_SITE_OTHER): Payer: BLUE CROSS/BLUE SHIELD | Admitting: Orthopedic Surgery

## 2017-01-09 DIAGNOSIS — M65341 Trigger finger, right ring finger: Secondary | ICD-10-CM

## 2017-01-09 DIAGNOSIS — Z4889 Encounter for other specified surgical aftercare: Secondary | ICD-10-CM

## 2017-01-09 NOTE — Progress Notes (Signed)
Patient ID: Rhonda Patton, female   DOB: Feb 20, 1961, 56 y.o.   MRN: [id number]  POSTOP VISIT   Chief Complaint  Patient presents with  . Follow-up    Trigger finger release right ring, DOS 11/27/16    Encounter Diagnoses  Name Primary?  Marland Kitchen Aftercare following surgery Yes  . Trigger ring finger of right hand     Examination shows a transverse incision over the right long finger which is tender. She can bend all the fingers down she has some tenderness also in the flexor tendon. Otherwise everything looks good  Recommend scar massage and 6 week follow-up  9:50 AM [name], MD 01/09/2017

## 2017-01-09 NOTE — Patient Instructions (Signed)
Scar massage with toothbrushes

## 2017-01-22 ENCOUNTER — Telehealth: Payer: Self-pay | Admitting: Orthopedic Surgery

## 2017-01-22 NOTE — Telephone Encounter (Signed)
Patient called and requested Hydrocodone/Acetaminophen  5-325  mgs.   Qty 20         Sig: Take 1 tablet by mouth every 6 (six) hours as needed for moderate pain.

## 2017-01-22 NOTE — Telephone Encounter (Signed)
Routing to Dr Harrison for approval 

## 2017-01-23 ENCOUNTER — Other Ambulatory Visit: Payer: Self-pay | Admitting: Orthopedic Surgery

## 2017-01-23 MED ORDER — HYDROCODONE-ACETAMINOPHEN 5-325 MG PO TABS: 1.0000 | ORAL_TABLET | Freq: Three times a day (TID) | ORAL | 0 refills | Status: DC | PRN

## 2017-01-23 NOTE — Progress Notes (Signed)
[place] controlled substance reporting system reviewed  

## 2017-02-05 ENCOUNTER — Telehealth: Payer: Self-pay | Admitting: Orthopedic Surgery

## 2017-02-05 NOTE — Telephone Encounter (Signed)
Patient requests refill:  HYDROcodone-acetaminophen (NORCO/VICODIN) 5-325 MG tablet 15 tablet

## 2017-02-05 NOTE — Telephone Encounter (Signed)
ROUTING TO DR HARRISON FOR APPROVAL 

## 2017-02-06 ENCOUNTER — Other Ambulatory Visit: Payer: Self-pay | Admitting: Orthopedic Surgery

## 2017-02-06 MED ORDER — HYDROCODONE-ACETAMINOPHEN 5-325 MG PO TABS: 1.0000 | ORAL_TABLET | Freq: Three times a day (TID) | ORAL | 0 refills | Status: DC | PRN

## 2017-02-23 ENCOUNTER — Ambulatory Visit: Payer: BLUE CROSS/BLUE SHIELD | Admitting: Orthopedic Surgery

## 2017-03-02 ENCOUNTER — Ambulatory Visit (INDEPENDENT_AMBULATORY_CARE_PROVIDER_SITE_OTHER): Payer: BLUE CROSS/BLUE SHIELD | Admitting: Orthopedic Surgery

## 2017-03-02 DIAGNOSIS — M65341 Trigger finger, right ring finger: Secondary | ICD-10-CM | POA: Diagnosis not present

## 2017-03-02 DIAGNOSIS — M7711 Lateral epicondylitis, right elbow: Secondary | ICD-10-CM

## 2017-03-02 NOTE — Patient Instructions (Addendum)
Apply ice to elbow nightly x 20 min Do exercises daily  Wear brace 8 am till bedtime    Tennis Elbow Tennis elbow is puffiness (inflammation) of the outer tendons of your forearm close to your elbow. Your tendons attach your muscles to your bones. Tennis elbow can happen in any sport or job in which you use your elbow too much. It is caused by doing the same motion over and over. Tennis elbow can cause:  Pain and tenderness in your forearm and the outer part of your elbow.  A burning feeling. This runs from your elbow through your arm.  Weak grip in your hands. Follow these instructions at home: Activity   Rest your elbow and wrist as told by your doctor. Try to avoid any activities that caused the problem until your doctor says that you can do them again.  If a physical therapist teaches you exercises, do all of them as told.  If you lift an object, lift it with your palm facing up. This is easier on your elbow. Lifestyle   If your tennis elbow is caused by sports, check your equipment and make sure that:  You are using it correctly.  It fits you well.  If your tennis elbow is caused by work, take breaks often, if you are able. Talk with your manager about doing your work in a way that is safe for you.  If your tennis elbow is caused by computer use, talk with your manager about any changes that can be made to your work setup. General instructions   If told, apply ice to the painful area:  Put ice in a plastic bag.  Place a towel between your skin and the bag.  Leave the ice on for 20 minutes, 2-3 times per day.  Take medicines only as told by your doctor.  If you were given a brace, wear it as told by your doctor.  Keep all follow-up visits as told by your doctor. This is important. Contact a doctor if:  Your pain does not get better with treatment.  Your pain gets worse.  You have weakness in your forearm, hand, or fingers.  You cannot feel your forearm,  hand, or fingers. This information is not intended to replace advice given to you by your health care provider. Make sure you discuss any questions you have with your health care provider. Document Released: 03/26/2010 Document Revised: 06/05/2016 Document Reviewed: 10/02/2014 Elsevier Interactive Patient Education  2017 [employer].

## 2017-03-02 NOTE — Progress Notes (Signed)
Patient ID: Rhonda Patton, female   DOB: Feb 03, 1961, 56 y.o.   MRN: [id number]  New problem  Chief Complaint  Patient presents with  . Follow-up    Trigger finger release 11/27/16 right long finger      This is postop day 95 after right long finger trigger release.  The patient was started on scar massage for pain over the incision  Her main complaints is that she is having a little difficulty getting her finger out to full extension. She's not having any clicking or popping and no pain over the incision  She is having pain over the right elbow which is new. It's dull aching is worse with lifting even small objects. The intensity waxes and wanes but the pain is constant. She denies any trauma    Review of Systems  Constitutional: Negative for fever.  Skin: Negative for rash.  Neurological: Positive for tingling.   Past Medical History:  Diagnosis Date  . Angina    occasional  . Anxiety   . Arthritis   . Borderline diabetes   . Carpal tunnel syndrome of right wrist   . GERD (gastroesophageal reflux disease)   . Hypercholesteremia   . Hypertension   . Panic attack   . PONV (postoperative nausea and vomiting)   . Sleep apnea    diagnosed but doesn't use CPAP     Physical Exam  Constitutional: She is oriented to person, place, and time. She appears well-developed and well-nourished.  Neurological: She is alert and oriented to person, place, and time.  Psychiatric: She has a normal mood and affect.  Vitals reviewed.    Right elbow is tender over the lateral epicondyle and soft tissues. Range of motion is full in flexion extension pronation supination, she is stable to valgus stress testing  Biceps and triceps strength is normal neurovascular exam is intact skin is without rash or erythema  Left elbow nontender with full range of motion  Procedure note injection for right tennis elbow   Diagnosis right tennis elbow  Anesthesia ethyl chloride was used Alcohol  use is clean the skin  After we obtained verbal consent and timeout a 25-gauge needle was used to inject 40 mg of Depo-Medrol and 3 cc of 1% lidocaine just distal to the insertion of the ECRB  There were no complications and a sterile bandage was applied.  Encounter Diagnoses  Name Primary?  . Trigger ring finger of right hand   . Lateral epicondylitis of right elbow Yes  ]  Plan  Injection Super 7 exercises  Tennis elbow brace   Return as needed

## 2017-04-05 DIAGNOSIS — M2392 Unspecified internal derangement of left knee: Secondary | ICD-10-CM | POA: Diagnosis not present

## 2017-04-05 DIAGNOSIS — M1712 Unilateral primary osteoarthritis, left knee: Secondary | ICD-10-CM | POA: Diagnosis not present

## 2017-04-05 DIAGNOSIS — M25562 Pain in left knee: Secondary | ICD-10-CM | POA: Diagnosis not present

## 2017-04-05 DIAGNOSIS — M1990 Unspecified osteoarthritis, unspecified site: Secondary | ICD-10-CM | POA: Diagnosis not present

## 2017-04-05 DIAGNOSIS — Z8249 Family history of ischemic heart disease and other diseases of the circulatory system: Secondary | ICD-10-CM | POA: Diagnosis not present

## 2017-04-05 DIAGNOSIS — I10 Essential (primary) hypertension: Secondary | ICD-10-CM | POA: Diagnosis not present

## 2017-04-05 DIAGNOSIS — R7303 Prediabetes: Secondary | ICD-10-CM | POA: Diagnosis not present

## 2017-04-07 ENCOUNTER — Encounter: Payer: Self-pay | Admitting: Orthopedic Surgery

## 2017-04-07 ENCOUNTER — Ambulatory Visit (INDEPENDENT_AMBULATORY_CARE_PROVIDER_SITE_OTHER): Payer: BLUE CROSS/BLUE SHIELD | Admitting: Orthopedic Surgery

## 2017-04-07 VITALS — BP 122/86 | HR 76 | Ht 63.5 in | Wt 246.0 lb

## 2017-04-07 DIAGNOSIS — M25562 Pain in left knee: Secondary | ICD-10-CM | POA: Diagnosis not present

## 2017-04-07 DIAGNOSIS — S83242A Other tear of medial meniscus, current injury, left knee, initial encounter: Secondary | ICD-10-CM

## 2017-04-07 MED ORDER — HYDROCODONE-ACETAMINOPHEN 5-325 MG PO TABS: 1.0000 | ORAL_TABLET | Freq: Four times a day (QID) | ORAL | 0 refills | Status: DC | PRN

## 2017-04-07 NOTE — Progress Notes (Signed)
NEW PATIENT OFFICE VISIT    Chief Complaint  Patient presents with  . Knee Pain    LEFT    56 year old female with prior history of intermittent pain popping and catching in her left knee presents with new pain secondary to new injury  The patient was at a family reunion stepped in a divot in twisted her left knee on June 16 she then twisted her knee and fell again yesterday on June 18  Pain left knee for 3 days  Onset  Dull ache  Constant  Giving way    Review of Systems  Respiratory: Positive for wheezing.   Cardiovascular: Negative for chest pain.  Skin: Negative for rash.     Past Medical History:  Diagnosis Date  . Angina    occasional  . Anxiety   . Arthritis   . Borderline diabetes   . Carpal tunnel syndrome of right wrist   . GERD (gastroesophageal reflux disease)   . Hypercholesteremia   . Hypertension   . Panic attack   . PONV (postoperative nausea and vomiting)   . Sleep apnea    diagnosed but doesn't use CPAP    Past Surgical History:  Procedure Laterality Date  . ABDOMINAL HYSTERECTOMY    . CESAREAN SECTION     x 3  . CHOLECYSTECTOMY    . FOOT SURGERY Right    x 2-bone spur  . HAND SURGERY Right    x 2-carpal tunnel  . KNEE ARTHROSCOPY Left   . PARTIAL HYSTERECTOMY  2001   total abdominal  . TRIGGER FINGER RELEASE Right 11/27/2016   Procedure: RELEASE TRIGGER FINGER/A-1 PULLEY RIGHT LONG FINGER;  Surgeon: Carole Civil, MD;  Location: [facility];  Service: Orthopedics;  Laterality: Right;  pt knows to arrive at 8:45    Family History  Problem Relation Age of Onset  . Heart disease Unknown   . Arthritis Unknown   . Cancer Unknown   . Diabetes Unknown    Social History  Substance Use Topics  . Smoking status: Never Smoker  . Smokeless tobacco: Never Used  . Alcohol use No    BP 122/86   Pulse 76   Ht 5' 3.5" (1.613 m)   Wt 246 lb (111.6 kg)   BMI 42.89 kg/m   Physical Exam  Constitutional: She is oriented to person,  place, and time. She appears well-developed and well-nourished.  Musculoskeletal:       Left knee: She exhibits effusion.  Neurological: She is alert and oriented to person, place, and time.  Psychiatric: She has a normal mood and affect.  Vitals reviewed.   Right Knee Exam   Tenderness  The patient is experiencing tenderness in the medial joint line.  Range of Motion  Extension: normal  Flexion:  100 normal   Muscle Strength   The patient has normal right knee strength.  Tests  McMurray:  Medial - negative Lateral - negative Drawer:       Anterior - negative    Posterior - negative Varus: negative Valgus: negative  Other  Erythema: absent Scars: absent Sensation: normal Pulse: present Swelling: none   Left Knee Exam   Tenderness  The patient is experiencing tenderness in the medial joint line.  Range of Motion  Extension:  -10 normal  Flexion:  60 normal   Muscle Strength   The patient has normal left knee strength.  Tests  McMurray:  Medial - positive Lateral - negative Drawer:  Anterior - negative     Posterior - negative Varus: negative Valgus: negative  Other  Erythema: absent Scars: absent Sensation: normal Pulse: present Swelling: none Effusion: effusion present      Meds ordered this encounter  Medications  . HYDROcodone-acetaminophen (NORCO/VICODIN) 5-325 MG tablet    Sig: Take 1 tablet by mouth every 6 (six) hours as needed for moderate pain.    Dispense:  20 tablet    Refill:  0    Encounter Diagnoses  Name Primary?  . Acute pain of left knee Yes  . Acute medial meniscus tear of left knee, initial encounter      PLAN:   MRI left knee to confirm meniscal tear diagnosed on clinical exam and history  Patient will need surgical treatment if meniscal tears found

## 2017-04-07 NOTE — Progress Notes (Signed)
[id number] 

## 2017-04-07 NOTE — Patient Instructions (Signed)
Use her cane for support  Take either ibuprofen or diclofenac to help with swelling and use the Norco for severe pain  Ice the knee 3 times a day for 20 minutes

## 2017-04-07 NOTE — Addendum Note (Signed)
Addended by: Baldomero Lamy B on: 04/07/2017 11:44 AM   Modules accepted: Orders

## 2017-04-15 ENCOUNTER — Ambulatory Visit (HOSPITAL_COMMUNITY)
Admission: RE | Admit: 2017-04-15 | Discharge: 2017-04-15 | Disposition: A | Discharge status: Home / Self Care | Payer: BLUE CROSS/BLUE SHIELD | Source: Ambulatory Visit | Attending: Orthopedic Surgery | Admitting: Orthopedic Surgery

## 2017-04-15 DIAGNOSIS — X58XXXA Exposure to other specified factors, initial encounter: Secondary | ICD-10-CM | POA: Diagnosis not present

## 2017-04-15 DIAGNOSIS — R937 Abnormal findings on diagnostic imaging of other parts of musculoskeletal system: Secondary | ICD-10-CM | POA: Insufficient documentation

## 2017-04-15 DIAGNOSIS — M25562 Pain in left knee: Secondary | ICD-10-CM | POA: Diagnosis not present

## 2017-04-15 DIAGNOSIS — S83242A Other tear of medial meniscus, current injury, left knee, initial encounter: Secondary | ICD-10-CM | POA: Diagnosis not present

## 2017-04-15 DIAGNOSIS — S83282A Other tear of lateral meniscus, current injury, left knee, initial encounter: Secondary | ICD-10-CM | POA: Insufficient documentation

## 2017-04-17 ENCOUNTER — Ambulatory Visit (INDEPENDENT_AMBULATORY_CARE_PROVIDER_SITE_OTHER): Payer: BLUE CROSS/BLUE SHIELD | Admitting: Orthopedic Surgery

## 2017-04-17 ENCOUNTER — Encounter: Payer: Self-pay | Admitting: Orthopedic Surgery

## 2017-04-17 DIAGNOSIS — M23322 Other meniscus derangements, posterior horn of medial meniscus, left knee: Secondary | ICD-10-CM

## 2017-04-17 DIAGNOSIS — M1712 Unilateral primary osteoarthritis, left knee: Secondary | ICD-10-CM | POA: Diagnosis not present

## 2017-04-17 DIAGNOSIS — M23301 Other meniscus derangements, unspecified lateral meniscus, left knee: Secondary | ICD-10-CM | POA: Diagnosis not present

## 2017-04-17 DIAGNOSIS — M25562 Pain in left knee: Secondary | ICD-10-CM

## 2017-04-17 MED ORDER — HYDROCODONE-ACETAMINOPHEN 5-325 MG PO TABS: 1.0000 | ORAL_TABLET | Freq: Four times a day (QID) | ORAL | 0 refills | Status: DC | PRN

## 2017-04-17 NOTE — Progress Notes (Signed)
Patient ID: Rhonda Patton, female   DOB: [birth year], 56 y.o.   MRN: [id number]  MRI FOLLOW UP  Chief Complaint  Patient presents with  . Follow-up    mri review left knee    HPI Rhonda Patton is a 56 y.o. female.    The patient has had MRI of the Left  56 year old female presents back for follow-up after MRI for ongoing pain in her left knee  56 year old female with prior history of intermittent pain popping and catching in her left knee presents with new pain secondary to new injury   The patient was at a family reunion stepped in a divot in twisted her left knee on June 16 she then twisted her knee and fell again yesterday on June 18   Pain left knee for 3 days   Onset   Dull ache   Constant   Giving way   Past Medical History:  Diagnosis Date  . Angina    occasional  . Anxiety   . Arthritis   . Borderline diabetes   . Carpal tunnel syndrome of right wrist   . GERD (gastroesophageal reflux disease)   . Hypercholesteremia   . Hypertension   . Panic attack   . PONV (postoperative nausea and vomiting)   . Sleep apnea    diagnosed but doesn't use CPAP   Past Surgical History:  Procedure Laterality Date  . ABDOMINAL HYSTERECTOMY    . CESAREAN SECTION     x 3  . CHOLECYSTECTOMY    . FOOT SURGERY Right    x 2-bone spur  . HAND SURGERY Right    x 2-carpal tunnel  . KNEE ARTHROSCOPY Left   . PARTIAL HYSTERECTOMY  2001   total abdominal  . TRIGGER FINGER RELEASE Right 11/27/2016   Procedure: RELEASE TRIGGER FINGER/A-1 PULLEY RIGHT LONG FINGER;  Surgeon: Carole Civil, MD;  Location: [facility];  Service: Orthopedics;  Laterality: Right;  pt knows to arrive at 8:45   Family History  Problem Relation Age of Onset  . Heart disease Unknown   . Arthritis Unknown   . Cancer Unknown   . Diabetes Unknown    Social History  Substance Use Topics  . Smoking status: Never Smoker  . Smokeless tobacco: Never Used  . Alcohol use No    Current Outpatient  Prescriptions:  .  acetaminophen (TYLENOL) 500 MG tablet, Take 1,000 mg by mouth 2 (two) times daily as needed for mild pain or moderate pain., Disp: , Rfl:  .  amLODipine (NORVASC) 5 MG tablet, Take 1 tablet (5 mg total) by mouth daily., Disp: 90 tablet, Rfl: 1 .  atorvastatin (LIPITOR) 40 MG tablet, Take 1 tablet (40 mg total) by mouth daily. (Patient taking differently: Take 40 mg by mouth daily at 6 PM. ), Disp: 90 tablet, Rfl: 1 .  diclofenac (VOLTAREN) 75 MG EC tablet, Take 1 tablet (75 mg total) by mouth 2 (two) times daily., Disp: 60 tablet, Rfl: 0 .  estradiol (ESTRACE) 0.5 MG tablet, Take 0.5 mg by mouth daily. , Disp: , Rfl:  .  furosemide (LASIX) 20 MG tablet, Take 1 tablet (20 mg total) by mouth every morning. (Patient taking differently: Take 20 mg by mouth daily. ), Disp: 90 tablet, Rfl: 0 .  gabapentin (NEURONTIN) 100 MG capsule, Take 1 capsule (100 mg total) by mouth 3 (three) times daily., Disp: 90 capsule, Rfl: 2 .  HYDROcodone-acetaminophen (NORCO/VICODIN) 5-325 MG tablet, Take 1 tablet by mouth every 6 (  six) hours as needed for moderate pain., Disp: 20 tablet, Rfl: 0 .  ibuprofen (ADVIL,MOTRIN) 800 MG tablet, Take 1 tablet (800 mg total) by mouth 3 (three) times daily. (Patient taking differently: Take 800 mg by mouth 3 (three) times daily as needed for mild pain. ), Disp: 30 tablet, Rfl: 0 .  potassium chloride (K-DUR) 10 MEQ tablet, Take 1 tablet (10 mEq total) by mouth daily., Disp: 90 tablet, Rfl: 0 .  ranitidine (ZANTAC) 75 MG tablet, Take 1 tablet (75 mg total) by mouth every evening., Disp: 30 tablet, Rfl: 2  Review of Systems  Respiratory: Positive for wheezing.   Cardiovascular: Negative for chest pain.  Skin: Negative for rash.    Physical Exam  Constitutional: She is oriented to person, place, and time. She appears well-developed and well-nourished.  Musculoskeletal:       Left knee: She exhibits effusion.  Neurological: She is alert and oriented to person,  place, and time.  Psychiatric: She has a normal mood and affect.   Her left knee is tender over the medial side. Her flexion is limited by pain and arthritis  All ligaments are stable  She has normal strength. There are no skin lesions  Sensation remains normal and her pulses good color is normal is no peripheral edema  On the right side we also find limited flexion but stable knee no tenderness or swelling normal motor exam neurovascular exam intact skin normal  She has a noticeable limp favoring her left leg  Data  A report of the MRI is also included  She does indeed have osteoarthritis all 3 compartments with the medial side being the worst and she has a torn medial and lateral meniscus  After explaining these findings to the patient and recommending arthroscopic surgery with the understanding she will have some continued arthritis pain she agreed to have medial and lateral meniscectomy of the left knee  Surgery for July 19  [name], MD 04/17/2017 11:19 AM

## 2017-04-17 NOTE — Patient Instructions (Signed)
You have decided to proceed with operative arthroscopy of the knee. You have decided not to continue with nonoperative measures such as but not limited to oral medication, weight loss, activity modification, physical therapy, bracing, or injection.  We will perform operative arthroscopy of the knee. Some of the risks associated with arthroscopic surgery of the knee include but are not limited to Bleeding Infection Swelling Stiffness Blood clot Pain  If you're not comfortable with these risks and would like to continue with nonoperative treatment please let Dr. Alasha Mcguinness know prior to your surgery. 

## 2017-04-28 ENCOUNTER — Encounter: Payer: Self-pay | Admitting: *Deleted

## 2017-04-28 NOTE — Progress Notes (Signed)
PER PATIENT PLAN SURGICAL PRE AUTHORIZATION IS NOT REQUIRED FOR CPT 29881 REFERENCE STEPHANIE L 04/28/17

## 2017-05-04 NOTE — Patient Instructions (Signed)
Rhonda Patton  05/04/2017     @PREFPERIOPPHARMACY @   Your procedure is scheduled on  05/07/2017   Report to [facility] at  855  A.M.  Call this number if you have problems the morning of surgery:  [phone]   Remember:  Do not eat food or drink liquids after midnight.  Take these medicines the morning of surgery with A SIP OF WATER  Norvasc, voltaren, gabapentin, hydrocodone, zantac.   Do not wear jewelry, make-up or nail polish.  Do not wear lotions, powders, or perfumes, or deoderant.  Do not shave 48 hours prior to surgery.  Men may shave face and neck.  Do not bring valuables to the hospital.  [facility] is not responsible for any belongings or valuables.  Contacts, dentures or bridgework may not be worn into surgery.  Leave your suitcase in the car.  After surgery it may be brought to your room.  For patients admitted to the hospital, discharge time will be determined by your treatment team.  Patients discharged the day of surgery will not be allowed to drive home.   Name and phone number of your driver:   family Special instructions:  None  Please read over the following fact sheets that you were given. Anesthesia Post-op Instructions and Care and Recovery After Surgery      Knee Ligament Injury, Arthroscopy Arthroscopy is a surgical technique in which your health care provider examines your knee through a small, pencil-sized telescope (arthroscope). Often, repairs to injured ligaments can be done with instruments in the arthroscope. Arthroscopy is less invasive than open-knee surgery. Tell a health care provider about:  Any allergies you have.  All medicines you are taking, including vitamins, herbs, eye drops, creams, and over-the-counter medicines.  Any problems you or family members have had with anesthetic medicines.  Any blood disorders you have.  Any surgeries you have had.  Any medical conditions you have. What are the  risks? Generally, this is a safe procedure. However, as with any procedure, problems can occur. Possible problems include:  Infection.  Bleeding.  Stiffness.  What happens before the procedure?  Ask your health care provider about changing or stopping any regular medicines. Avoid taking aspirin or blood thinners as directed by your health care provider.  Do not eat or drink anything after midnight the night before surgery.  If you smoke, do not smoke for at least 2 weeks before your surgery.  Do not drink alcohol starting the day before your surgery.  Let your health care provider know if you develop a cold or any infection before your surgery.  Arrange for someone to drive you home after the surgery or after your hospital stay. Also arrange for someone to help you with activities during recovery. What happens during the procedure?  Small monitors will be put on your body. They are used to check your heart, blood pressure, and oxygen levels.  An IV access tube will be put into one of your veins. Medicine will be able to flow directly into your body through this IV tube.  You might be given a medicine to help you relax (sedative).  You will be given a medicine that makes you go to sleep (general anesthetic), and a breathing tube will be placed into your lungs during the procedure.  Several small incisions are made in your knee. Saline fluid is placed into one of the incisions to expand the knee  and clear away any blood in the knee.  Your health care provider will insert the arthroscope to examine the injured knee.  During arthroscopy, your health care provider may find a partial or complete tear in a ligament.  Tools can be inserted through the other incisions to repair the injured ligaments.  The incisions are then closed with absorbable stitches and covered with dressings. What happens after the procedure?  You will be taken to the recovery area where you will be  monitored.  When you are awake, stable, and taking fluids without problems, you will be allowed to go home. This information is not intended to replace advice given to you by your health care provider. Make sure you discuss any questions you have with your health care provider. Document Released: 10/03/2000 Document Revised: 03/13/2016 Document Reviewed: 05/18/2013 Elsevier Interactive Patient Education  2017 [place].  Arthroscopic Knee Ligament Repair, Care After This sheet gives you information about how to care for yourself after your procedure. Your health care provider may also give you more specific instructions. If you have problems or questions, contact your health care provider. What can I expect after the procedure? After the procedure, it is common to have:  Pain in your knee.  Bruising and swelling on your knee, calf, and ankle for 3-4 days.  Fatigue.  Follow these instructions at home: If you have a brace or immobilizer:  Wear the brace or immobilizer as told by your health care provider. Remove it only as told by your health care provider.  Loosen the splint or immobilizer if your toes tingle, become numb, or turn cold and blue.  Keep the brace or immobilizer clean. Bathing  Do not take baths, swim, or use a hot tub until your health care provider approves. Ask your health care provider if you can take showers.  Keep your bandage (dressing) dry until your health care provider says that it can be removed. Cover it and your brace or immobilizer with a watertight covering when you take a shower. Incision care  Follow instructions from your health care provider about how to take care of your incision. Make sure you: ? Wash your hands with soap and water before you change your bandage (dressing). If soap and water are not available, use hand sanitizer. ? Change your dressing as told by your health care provider. ? Leave stitches (sutures), skin glue, or adhesive  strips in place. These skin closures may need to stay in place for 2 weeks or longer. If adhesive strip edges start to loosen and curl up, you may trim the loose edges. Do not remove adhesive strips completely unless your health care provider tells you to do that.  Check your incision area every day for signs of infection. Check for: ? More redness, swelling, or pain. ? More fluid or blood. ? Warmth. ? Pus or a bad smell. Managing pain, stiffness, and swelling  If directed, put ice on the affected area. ? If you have a removable brace or immobilizer, remove it as told by your health care provider. ? Put ice in a plastic bag. ? Place a towel between your skin and the bag or between your brace or immobilizer and the bag. ? Leave the ice on for 20 minutes, 2-3 times a day.  Move your toes often to avoid stiffness and to lessen swelling.  Raise (elevate) the injured area above the level of your heart while you are sitting or lying down. Driving  Do not  drive until your health care provider approves. If you have a brace or immobilizer on your leg, ask your health care provider when it is safe for you to drive.  Do not drive or use heavy machinery while taking prescription pain medicine. Activity  Rest as directed. Ask your health care provider what activities are safe for you.  Do physical therapy exercises as told by your health care provider. Physical therapy will help you regain strength and motion in your knee.  Follow instructions from your health care provider about: ? When you may start motion exercises. ? When you may start riding a stationary bike and doing other low-impact activities. ? When you may start to jog and do other high-impact activities. Safety  Do not use the injured limb to support your body weight until your health care provider says that you can. Use crutches as told by your health care provider. General instructions  Do not use any products that contain  nicotine or tobacco, such as cigarettes and e-cigarettes. These can delay bone healing. If you need help quitting, ask your health care provider.  To prevent or treat constipation while you are taking prescription pain medicine, your health care provider may recommend that you: ? Drink enough fluid to keep your urine clear or pale yellow. ? Take over-the-counter or prescription medicines. ? Eat foods that are high in fiber, such as fresh fruits and vegetables, whole grains, and beans. ? Limit foods that are high in fat and processed sugars, such as fried and sweet foods.  Take over-the-counter and prescription medicines only as told by your health care provider.  Keep all follow-up visits as told by your health care provider. This is important. Contact a health care provider if:  You have more redness, swelling, or pain around an incision.  You have more fluid or blood coming from an incision.  Your incision feels warm to the touch.  You have a fever.  You have pain or swelling in your knee, and it gets worse.  You have pain that does not get better with medicine. Get help right away if:  You have trouble breathing.  You have pus or a bad smell coming from an incision.  You have numbness and tingling near the knee joint. Summary  After the procedure, it is common to have knee pain with bruising and swelling on your knee, calf, and ankle.  Icing your knee and raising your leg above the level of your heart will help control the pain and the swelling.  Do physical therapy exercises as told by your health care provider. Physical therapy will help you regain strength and motion in your knee. This information is not intended to replace advice given to you by your health care provider. Make sure you discuss any questions you have with your health care provider. Document Released: 07/27/2013 Document Revised: 09/30/2016 Document Reviewed: 09/30/2016 Elsevier Interactive Patient  Education  2017 [place] Anesthesia, Adult General anesthesia is the use of medicines to make a person "go to sleep" (be unconscious) for a medical procedure. General anesthesia is often recommended when a procedure:  Is long.  Requires you to be still or in an unusual position.  Is major and can cause you to lose blood.  Is impossible to do without general anesthesia.  The medicines used for general anesthesia are called general anesthetics. In addition to making you sleep, the medicines:  Prevent pain.  Control your blood pressure.  Relax your muscles.  Tell a health care provider about:  Any allergies you have.  All medicines you are taking, including vitamins, herbs, eye drops, creams, and over-the-counter medicines.  Any problems you or family members have had with anesthetic medicines.  Types of anesthetics you have had in the past.  Any bleeding disorders you have.  Any surgeries you have had.  Any medical conditions you have.  Any history of heart or lung conditions, such as heart failure, sleep apnea, or chronic obstructive pulmonary disease (COPD).  Whether you are pregnant or may be pregnant.  Whether you use tobacco, alcohol, marijuana, or street drugs.  Any history of Armed forces logistics/support/administrative officer.  Any history of depression or anxiety. What are the risks? Generally, this is a safe procedure. However, problems may occur, including:  Allergic reaction to anesthetics.  Lung and heart problems.  Inhaling food or liquids from your stomach into your lungs (aspiration).  Injury to nerves.  Waking up during your procedure and being unable to move (rare).  Extreme agitation or a state of mental confusion (delirium) when you wake up from the anesthetic.  Air in the bloodstream, which can lead to stroke.  These problems are more likely to develop if you are having a major surgery or if you have an advanced medical condition. You can prevent some of  these complications by answering all of your health care provider's questions thoroughly and by following all pre-procedure instructions. General anesthesia can cause side effects, including:  Nausea or vomiting  A sore throat from the breathing tube.  Feeling cold or shivery.  Feeling tired, washed out, or achy.  Sleepiness or drowsiness.  Confusion or agitation.  What happens before the procedure? Staying hydrated Follow instructions from your health care provider about hydration, which may include:  Up to 2 hours before the procedure - you may continue to drink clear liquids, such as water, clear fruit juice, black coffee, and plain tea.  Eating and drinking restrictions Follow instructions from your health care provider about eating and drinking, which may include:  8 hours before the procedure - stop eating heavy meals or foods such as meat, fried foods, or fatty foods.  6 hours before the procedure - stop eating light meals or foods, such as toast or cereal.  6 hours before the procedure - stop drinking milk or drinks that contain milk.  2 hours before the procedure - stop drinking clear liquids.  Medicines  Ask your health care provider about: ? Changing or stopping your regular medicines. This is especially important if you are taking diabetes medicines or blood thinners. ? Taking medicines such as aspirin and ibuprofen. These medicines can thin your blood. Do not take these medicines before your procedure if your health care provider instructs you not to. ? Taking new dietary supplements or medicines. Do not take these during the week before your procedure unless your health care provider approves them.  If you are told to take a medicine or to continue taking a medicine on the day of the procedure, take the medicine with sips of water. General instructions   Ask if you will be going home the same day, the following day, or after a longer hospital stay. ? Plan to  have someone take you home. ? Plan to have someone stay with you for the first 24 hours after you leave the hospital or clinic.  For 3-6 weeks before the procedure, try not to use any tobacco products, such as cigarettes, chewing tobacco, and e-cigarettes.  You may brush your teeth on the morning of the procedure, but make sure to spit out the toothpaste. What happens during the procedure?  You will be given anesthetics through a mask and through an IV tube in one of your veins.  You may receive medicine to help you relax (sedative).  As soon as you are asleep, a breathing tube may be used to help you breathe.  An anesthesia specialist will stay with you throughout the procedure. He or she will help keep you comfortable and safe by continuing to give you medicines and adjusting the amount of medicine that you get. He or she will also watch your blood pressure, pulse, and oxygen levels to make sure that the anesthetics do not cause any problems.  If a breathing tube was used to help you breathe, it will be removed before you wake up. The procedure may vary among health care providers and hospitals. What happens after the procedure?  You will wake up, often slowly, after the procedure is complete, usually in a recovery area.  Your blood pressure, heart rate, breathing rate, and blood oxygen level will be monitored until the medicines you were given have worn off.  You may be given medicine to help you calm down if you feel anxious or agitated.  If you will be going home the same day, your health care provider may check to make sure you can stand, drink, and urinate.  Your health care providers will treat your pain and side effects before you go home.  Do not drive for 24 hours if you received a sedative.  You may: ? Feel nauseous and vomit. ? Have a sore throat. ? Have mental slowness. ? Feel cold or shivery. ? Feel sleepy. ? Feel tired. ? Feel sore or achy, even in parts of your  body where you did not have surgery. This information is not intended to replace advice given to you by your health care provider. Make sure you discuss any questions you have with your health care provider. Document Released: 01/13/2008 Document Revised: 03/18/2016 Document Reviewed: 09/20/2015 Elsevier Interactive Patient Education  2018 [place] Anesthesia, Adult, Care After These instructions provide you with information about caring for yourself after your procedure. Your health care provider may also give you more specific instructions. Your treatment has been planned according to current medical practices, but problems sometimes occur. Call your health care provider if you have any problems or questions after your procedure. What can I expect after the procedure? After the procedure, it is common to have:  Vomiting.  A sore throat.  Mental slowness.  It is common to feel:  Nauseous.  Cold or shivery.  Sleepy.  Tired.  Sore or achy, even in parts of your body where you did not have surgery.  Follow these instructions at home: For at least 24 hours after the procedure:  Do not: ? Participate in activities where you could fall or become injured. ? Drive. ? Use heavy machinery. ? Drink alcohol. ? Take sleeping pills or medicines that cause drowsiness. ? Make important decisions or sign legal documents. ? Take care of children on your own.  Rest. Eating and drinking  If you vomit, drink water, juice, or soup when you can drink without vomiting.  Drink enough fluid to keep your urine clear or pale yellow.  Make sure you have little or no nausea before eating solid foods.  Follow the diet recommended by your health care provider. General instructions  Have a responsible adult stay with you until you are awake and alert.  Return to your normal activities as told by your health care provider. Ask your health care provider what activities are safe for  you.  Take over-the-counter and prescription medicines only as told by your health care provider.  If you smoke, do not smoke without supervision.  Keep all follow-up visits as told by your health care provider. This is important. Contact a health care provider if:  You continue to have nausea or vomiting at home, and medicines are not helpful.  You cannot drink fluids or start eating again.  You cannot urinate after 8-12 hours.  You develop a skin rash.  You have fever.  You have increasing redness at the site of your procedure. Get help right away if:  You have difficulty breathing.  You have chest pain.  You have unexpected bleeding.  You feel that you are having a life-threatening or urgent problem. This information is not intended to replace advice given to you by your health care provider. Make sure you discuss any questions you have with your health care provider. Document Released: 01/12/2001 Document Revised: 03/10/2016 Document Reviewed: 09/20/2015 Elsevier Interactive Patient Education  [employer].

## 2017-05-05 ENCOUNTER — Encounter (HOSPITAL_COMMUNITY): Payer: Self-pay

## 2017-05-05 ENCOUNTER — Encounter (HOSPITAL_COMMUNITY)
Admission: RE | Admit: 2017-05-05 | Discharge: 2017-05-05 | Disposition: A | Discharge status: Home / Self Care | Payer: BLUE CROSS/BLUE SHIELD | Source: Ambulatory Visit | Attending: Orthopedic Surgery | Admitting: Orthopedic Surgery

## 2017-05-05 DIAGNOSIS — G4730 Sleep apnea, unspecified: Secondary | ICD-10-CM | POA: Diagnosis not present

## 2017-05-05 DIAGNOSIS — E7800 Pure hypercholesterolemia, unspecified: Secondary | ICD-10-CM | POA: Diagnosis not present

## 2017-05-05 DIAGNOSIS — Z833 Family history of diabetes mellitus: Secondary | ICD-10-CM | POA: Diagnosis not present

## 2017-05-05 DIAGNOSIS — Z791 Long term (current) use of non-steroidal anti-inflammatories (NSAID): Secondary | ICD-10-CM | POA: Diagnosis not present

## 2017-05-05 DIAGNOSIS — Z79899 Other long term (current) drug therapy: Secondary | ICD-10-CM | POA: Diagnosis not present

## 2017-05-05 DIAGNOSIS — K219 Gastro-esophageal reflux disease without esophagitis: Secondary | ICD-10-CM | POA: Diagnosis not present

## 2017-05-05 DIAGNOSIS — S83282A Other tear of lateral meniscus, current injury, left knee, initial encounter: Secondary | ICD-10-CM | POA: Diagnosis not present

## 2017-05-05 DIAGNOSIS — M1712 Unilateral primary osteoarthritis, left knee: Secondary | ICD-10-CM | POA: Diagnosis not present

## 2017-05-05 DIAGNOSIS — W19XXXA Unspecified fall, initial encounter: Secondary | ICD-10-CM | POA: Diagnosis not present

## 2017-05-05 DIAGNOSIS — Z8261 Family history of arthritis: Secondary | ICD-10-CM | POA: Diagnosis not present

## 2017-05-05 DIAGNOSIS — M25762 Osteophyte, left knee: Secondary | ICD-10-CM | POA: Diagnosis not present

## 2017-05-05 DIAGNOSIS — R7303 Prediabetes: Secondary | ICD-10-CM | POA: Diagnosis not present

## 2017-05-05 DIAGNOSIS — I10 Essential (primary) hypertension: Secondary | ICD-10-CM | POA: Diagnosis not present

## 2017-05-05 DIAGNOSIS — Z8249 Family history of ischemic heart disease and other diseases of the circulatory system: Secondary | ICD-10-CM | POA: Diagnosis not present

## 2017-05-05 DIAGNOSIS — Z90710 Acquired absence of both cervix and uterus: Secondary | ICD-10-CM | POA: Diagnosis not present

## 2017-05-05 DIAGNOSIS — F410 Panic disorder [episodic paroxysmal anxiety] without agoraphobia: Secondary | ICD-10-CM | POA: Diagnosis not present

## 2017-05-05 DIAGNOSIS — S83242A Other tear of medial meniscus, current injury, left knee, initial encounter: Secondary | ICD-10-CM | POA: Diagnosis not present

## 2017-05-05 DIAGNOSIS — Z9889 Other specified postprocedural states: Secondary | ICD-10-CM | POA: Diagnosis not present

## 2017-05-05 DIAGNOSIS — I209 Angina pectoris, unspecified: Secondary | ICD-10-CM | POA: Diagnosis not present

## 2017-05-05 LAB — BASIC METABOLIC PANEL
Anion gap: 11 (ref 5–15)
BUN: 13 mg/dL (ref 6–20)
CHLORIDE: 104 mmol/L (ref 101–111)
CO2: 24 mmol/L (ref 22–32)
CREATININE: 0.98 mg/dL (ref 0.44–1.00)
Calcium: 9.2 mg/dL (ref 8.9–10.3)
GFR calc Af Amer: >60 mL/min (ref >60)
GFR calc non Af Amer: >60 mL/min (ref >60)
GLUCOSE: 112 mg/dL — AB (ref 65–99)
Potassium: 3.5 mmol/L (ref 3.5–5.1)
SODIUM: 139 mmol/L (ref 135–145)

## 2017-05-05 LAB — SURGICAL PCR SCREEN
MRSA, PCR: NEGATIVE
STAPHYLOCOCCUS AUREUS: POSITIVE — AB

## 2017-05-05 MED ORDER — MUPIROCIN 2 % EX OINT
TOPICAL_OINTMENT | CUTANEOUS | Status: AC
  Filled 2017-05-05: qty 22

## 2017-05-06 NOTE — H&P (Signed)
Chief Complaint  Patient presents with  . Follow-up      mri review left knee      HPI Rhonda Patton is a 56 y.o. female.     56 year old female with prior history of intermittent pain popping and catching in her left knee presents with new pain secondary to new injury   The patient was at a family reunion stepped in a divot in twisted her left knee on June 16 she then twisted her knee and fell again yesterday on June 18   Pain left knee for 3 days   Onset   Dull ache   Constant   Giving way       Past Medical History:  Diagnosis Date  . Angina      occasional  . Anxiety    . Arthritis    . Borderline diabetes    . Carpal tunnel syndrome of right wrist    . GERD (gastroesophageal reflux disease)    . Hypercholesteremia    . Hypertension    . Panic attack    . PONV (postoperative nausea and vomiting)    . Sleep apnea      diagnosed but doesn't use CPAP         Past Surgical History:  Procedure Laterality Date  . ABDOMINAL HYSTERECTOMY      . CESAREAN SECTION        x 3  . CHOLECYSTECTOMY      . FOOT SURGERY Right      x 2-bone spur  . HAND SURGERY Right      x 2-carpal tunnel  . KNEE ARTHROSCOPY Left    . PARTIAL HYSTERECTOMY   2001    total abdominal  . TRIGGER FINGER RELEASE Right 11/27/2016    Procedure: RELEASE TRIGGER FINGER/A-1 PULLEY RIGHT LONG FINGER;  Surgeon: Carole Civil, MD;  Location: [facility];  Service: Orthopedics;  Laterality: Right;  pt knows to arrive at 8:45         Family History  Problem Relation Age of Onset  . Heart disease Unknown    . Arthritis Unknown    . Cancer Unknown    . Diabetes Unknown          Social History  Substance Use Topics  . Smoking status: Never Smoker  . Smokeless tobacco: Never Used  . Alcohol use No      Current Outpatient Prescriptions:  .  acetaminophen (TYLENOL) 500 MG tablet, Take 1,000 mg by mouth 2 (two) times daily as needed for mild pain or moderate pain., Disp: , Rfl:  .   amLODipine (NORVASC) 5 MG tablet, Take 1 tablet (5 mg total) by mouth daily., Disp: 90 tablet, Rfl: 1 .  atorvastatin (LIPITOR) 40 MG tablet, Take 1 tablet (40 mg total) by mouth daily. (Patient taking differently: Take 40 mg by mouth daily at 6 PM. ), Disp: 90 tablet, Rfl: 1 .  diclofenac (VOLTAREN) 75 MG EC tablet, Take 1 tablet (75 mg total) by mouth 2 (two) times daily., Disp: 60 tablet, Rfl: 0 .  estradiol (ESTRACE) 0.5 MG tablet, Take 0.5 mg by mouth daily. , Disp: , Rfl:  .  furosemide (LASIX) 20 MG tablet, Take 1 tablet (20 mg total) by mouth every morning. (Patient taking differently: Take 20 mg by mouth daily. ), Disp: 90 tablet, Rfl: 0 .  gabapentin (NEURONTIN) 100 MG capsule, Take 1 capsule (100 mg total) by mouth 3 (three) times daily., Disp: 90 capsule, Rfl: 2 .  HYDROcodone-acetaminophen (NORCO/VICODIN) 5-325 MG tablet, Take 1 tablet by mouth every 6 (six) hours as needed for moderate pain., Disp: 20 tablet, Rfl: 0 .  ibuprofen (ADVIL,MOTRIN) 800 MG tablet, Take 1 tablet (800 mg total) by mouth 3 (three) times daily. (Patient taking differently: Take 800 mg by mouth 3 (three) times daily as needed for mild pain. ), Disp: 30 tablet, Rfl: 0 .  potassium chloride (K-DUR) 10 MEQ tablet, Take 1 tablet (10 mEq total) by mouth daily., Disp: 90 tablet, Rfl: 0 .  ranitidine (ZANTAC) 75 MG tablet, Take 1 tablet (75 mg total) by mouth every evening., Disp: 30 tablet, Rfl: 2   Review of Systems  Respiratory: Positive for wheezing.   Cardiovascular: Negative for chest pain.  Skin: Negative for rash.      Physical Exam  Constitutional: She is oriented to person, place, and time. She appears well-developed and well-nourished.  Musculoskeletal:       Left knee: She exhibits effusion.  Neurological: She is alert and oriented to person, place, and time.  Psychiatric: She has a normal mood and affect.    Her left knee is tender over the medial side. Her flexion is limited by pain and arthritis    All ligaments are stable   She has normal strength. There are no skin lesions   Sensation remains normal and her pulses good color is normal is no peripheral edema   On the right side we also find limited flexion but stable knee no tenderness or swelling normal motor exam neurovascular exam intact skin normal   She has a noticeable limp favoring her left leg   Data   A report of the MRI is also included   She does indeed have osteoarthritis all 3 compartments with the medial side being the worst and she has a torn medial and lateral meniscus   After explaining these findings to the patient and recommending arthroscopic surgery with the understanding she will have some continued arthritis pain she agreed to have medial and lateral meniscectomy of the left knee   Surgery for July 19   [name], MD

## 2017-05-07 ENCOUNTER — Ambulatory Visit (HOSPITAL_COMMUNITY): Payer: BLUE CROSS/BLUE SHIELD | Admitting: Anesthesiology

## 2017-05-07 ENCOUNTER — Encounter (HOSPITAL_COMMUNITY)
Admission: RE | Disposition: A | Discharge status: Home / Self Care | Payer: Self-pay | Source: Ambulatory Visit | Attending: Orthopedic Surgery

## 2017-05-07 ENCOUNTER — Ambulatory Visit (HOSPITAL_COMMUNITY)
Admission: RE | Admit: 2017-05-07 | Discharge: 2017-05-07 | Disposition: A | Discharge status: Home / Self Care | Payer: BLUE CROSS/BLUE SHIELD | Source: Ambulatory Visit | Attending: Orthopedic Surgery | Admitting: Orthopedic Surgery

## 2017-05-07 ENCOUNTER — Encounter (HOSPITAL_COMMUNITY): Payer: Self-pay | Admitting: *Deleted

## 2017-05-07 DIAGNOSIS — R7303 Prediabetes: Secondary | ICD-10-CM | POA: Diagnosis not present

## 2017-05-07 DIAGNOSIS — Z9889 Other specified postprocedural states: Secondary | ICD-10-CM | POA: Insufficient documentation

## 2017-05-07 DIAGNOSIS — S83232A Complex tear of medial meniscus, current injury, left knee, initial encounter: Secondary | ICD-10-CM | POA: Diagnosis not present

## 2017-05-07 DIAGNOSIS — Z8249 Family history of ischemic heart disease and other diseases of the circulatory system: Secondary | ICD-10-CM | POA: Diagnosis not present

## 2017-05-07 DIAGNOSIS — G4730 Sleep apnea, unspecified: Secondary | ICD-10-CM | POA: Diagnosis not present

## 2017-05-07 DIAGNOSIS — Z90710 Acquired absence of both cervix and uterus: Secondary | ICD-10-CM | POA: Insufficient documentation

## 2017-05-07 DIAGNOSIS — M1712 Unilateral primary osteoarthritis, left knee: Secondary | ICD-10-CM

## 2017-05-07 DIAGNOSIS — S83282A Other tear of lateral meniscus, current injury, left knee, initial encounter: Secondary | ICD-10-CM | POA: Insufficient documentation

## 2017-05-07 DIAGNOSIS — F410 Panic disorder [episodic paroxysmal anxiety] without agoraphobia: Secondary | ICD-10-CM | POA: Insufficient documentation

## 2017-05-07 DIAGNOSIS — E7800 Pure hypercholesterolemia, unspecified: Secondary | ICD-10-CM | POA: Diagnosis not present

## 2017-05-07 DIAGNOSIS — Z791 Long term (current) use of non-steroidal anti-inflammatories (NSAID): Secondary | ICD-10-CM | POA: Diagnosis not present

## 2017-05-07 DIAGNOSIS — I209 Angina pectoris, unspecified: Secondary | ICD-10-CM | POA: Diagnosis not present

## 2017-05-07 DIAGNOSIS — M23201 Derangement of unspecified lateral meniscus due to old tear or injury, left knee: Secondary | ICD-10-CM | POA: Diagnosis not present

## 2017-05-07 DIAGNOSIS — S83242A Other tear of medial meniscus, current injury, left knee, initial encounter: Secondary | ICD-10-CM | POA: Diagnosis not present

## 2017-05-07 DIAGNOSIS — W19XXXA Unspecified fall, initial encounter: Secondary | ICD-10-CM | POA: Insufficient documentation

## 2017-05-07 DIAGNOSIS — K219 Gastro-esophageal reflux disease without esophagitis: Secondary | ICD-10-CM | POA: Insufficient documentation

## 2017-05-07 DIAGNOSIS — I10 Essential (primary) hypertension: Secondary | ICD-10-CM | POA: Insufficient documentation

## 2017-05-07 DIAGNOSIS — M25762 Osteophyte, left knee: Secondary | ICD-10-CM | POA: Diagnosis not present

## 2017-05-07 DIAGNOSIS — Z833 Family history of diabetes mellitus: Secondary | ICD-10-CM | POA: Insufficient documentation

## 2017-05-07 DIAGNOSIS — Z8261 Family history of arthritis: Secondary | ICD-10-CM | POA: Insufficient documentation

## 2017-05-07 DIAGNOSIS — Z79899 Other long term (current) drug therapy: Secondary | ICD-10-CM | POA: Insufficient documentation

## 2017-05-07 HISTORY — PX: KNEE ARTHROSCOPY WITH MEDIAL MENISECTOMY: SHX5651

## 2017-05-07 SURGERY — ARTHROSCOPY, KNEE, WITH MEDIAL MENISCECTOMY
Anesthesia: General | Site: Knee | Laterality: Left

## 2017-05-07 MED ORDER — SCOPOLAMINE 1 MG/3DAYS TD PT72
MEDICATED_PATCH | TRANSDERMAL | Status: AC
  Filled 2017-05-07: qty 1

## 2017-05-07 MED ORDER — PROPOFOL 10 MG/ML IV BOLUS
INTRAVENOUS | Status: AC
  Filled 2017-05-07: qty 20

## 2017-05-07 MED ORDER — BUPIVACAINE-EPINEPHRINE (PF) 0.5% -1:200000 IJ SOLN
INTRAMUSCULAR | Status: DC | PRN
  Administered 2017-05-07: 60 mL via PERINEURAL

## 2017-05-07 MED ORDER — DEXAMETHASONE SODIUM PHOSPHATE 4 MG/ML IJ SOLN
4.0000 mg | Freq: Once | INTRAMUSCULAR | Status: AC
  Administered 2017-05-07: 4 mg via INTRAVENOUS

## 2017-05-07 MED ORDER — MIDAZOLAM HCL 2 MG/2ML IJ SOLN: 1.0000 mg | INTRAMUSCULAR | Status: AC

## 2017-05-07 MED ORDER — LIDOCAINE HCL (PF) 1 % IJ SOLN
INTRAMUSCULAR | Status: AC
  Filled 2017-05-07: qty 5

## 2017-05-07 MED ORDER — FENTANYL CITRATE (PF) 100 MCG/2ML IJ SOLN
INTRAMUSCULAR | Status: DC | PRN
  Administered 2017-05-07 (×2): 50 ug via INTRAVENOUS

## 2017-05-07 MED ORDER — CEFAZOLIN SODIUM-DEXTROSE 2-4 GM/100ML-% IV SOLN
INTRAVENOUS | Status: AC
  Filled 2017-05-07: qty 100

## 2017-05-07 MED ORDER — HYDROCODONE-ACETAMINOPHEN 10-325 MG PO TABS: 1.0000 | ORAL_TABLET | ORAL | 0 refills | Status: DC | PRN

## 2017-05-07 MED ORDER — ONDANSETRON HCL 4 MG/2ML IJ SOLN
4.0000 mg | Freq: Once | INTRAMUSCULAR | Status: AC
  Administered 2017-05-07: 4 mg via INTRAVENOUS

## 2017-05-07 MED ORDER — SODIUM CHLORIDE 0.9 % IR SOLN
Status: DC | PRN
  Administered 2017-05-07: 1000 mL

## 2017-05-07 MED ORDER — FENTANYL CITRATE (PF) 100 MCG/2ML IJ SOLN
INTRAMUSCULAR | Status: AC
  Filled 2017-05-07: qty 2

## 2017-05-07 MED ORDER — PROPOFOL 10 MG/ML IV BOLUS
INTRAVENOUS | Status: DC | PRN
  Administered 2017-05-07: 150 mg via INTRAVENOUS

## 2017-05-07 MED ORDER — GLYCOPYRROLATE 0.2 MG/ML IJ SOLN
INTRAMUSCULAR | Status: AC
  Filled 2017-05-07: qty 1

## 2017-05-07 MED ORDER — DEXAMETHASONE SODIUM PHOSPHATE 4 MG/ML IJ SOLN
INTRAMUSCULAR | Status: AC
  Filled 2017-05-07: qty 1

## 2017-05-07 MED ORDER — SCOPOLAMINE 1 MG/3DAYS TD PT72
1.0000 | MEDICATED_PATCH | Freq: Once | TRANSDERMAL | Status: DC
  Administered 2017-05-07: 1.5 mg via TRANSDERMAL

## 2017-05-07 MED ORDER — EPINEPHRINE PF 1 MG/ML IJ SOLN
INTRAMUSCULAR | Status: DC | PRN
  Administered 2017-05-07 (×5): 3000 mL

## 2017-05-07 MED ORDER — ONDANSETRON HCL 4 MG/2ML IJ SOLN
INTRAMUSCULAR | Status: AC
  Filled 2017-05-07: qty 2

## 2017-05-07 MED ORDER — CHLORHEXIDINE GLUCONATE 4 % EX LIQD: 60.0000 mL | Freq: Once | CUTANEOUS | Status: DC

## 2017-05-07 MED ORDER — LACTATED RINGERS IV SOLN
INTRAVENOUS | Status: DC
  Administered 2017-05-07 (×2): via INTRAVENOUS

## 2017-05-07 MED ORDER — LIDOCAINE HCL (CARDIAC) 20 MG/ML IV SOLN
INTRAVENOUS | Status: DC | PRN
  Administered 2017-05-07: 30 mg via INTRAVENOUS

## 2017-05-07 MED ORDER — CEFAZOLIN SODIUM-DEXTROSE 2-4 GM/100ML-% IV SOLN
2.0000 g | INTRAVENOUS | Status: AC
  Administered 2017-05-07: 2 g via INTRAVENOUS

## 2017-05-07 MED ORDER — MIDAZOLAM HCL 2 MG/2ML IJ SOLN
INTRAMUSCULAR | Status: AC
  Filled 2017-05-07: qty 2

## 2017-05-07 MED ORDER — GLYCOPYRROLATE 0.2 MG/ML IJ SOLN
INTRAMUSCULAR | Status: DC | PRN
  Administered 2017-05-07: 0.2 mg via INTRAVENOUS

## 2017-05-07 MED ORDER — HYDROCODONE-ACETAMINOPHEN 7.5-325 MG PO TABS
1.0000 | ORAL_TABLET | Freq: Once | ORAL | Status: AC
  Administered 2017-05-07: 1 via ORAL

## 2017-05-07 MED ORDER — BUPIVACAINE-EPINEPHRINE (PF) 0.5% -1:200000 IJ SOLN
INTRAMUSCULAR | Status: AC
  Filled 2017-05-07: qty 60

## 2017-05-07 MED ORDER — HYDROCODONE-ACETAMINOPHEN 7.5-325 MG PO TABS
ORAL_TABLET | ORAL | Status: AC
  Filled 2017-05-07: qty 1

## 2017-05-07 MED ORDER — FENTANYL CITRATE (PF) 100 MCG/2ML IJ SOLN
25.0000 ug | INTRAMUSCULAR | Status: DC | PRN
  Administered 2017-05-07: 50 ug via INTRAVENOUS
  Filled 2017-05-07: qty 2

## 2017-05-07 MED ORDER — SUCCINYLCHOLINE CHLORIDE 20 MG/ML IJ SOLN
INTRAMUSCULAR | Status: DC | PRN
  Administered 2017-05-07: 140 mg via INTRAVENOUS

## 2017-05-07 MED ORDER — SUCCINYLCHOLINE CHLORIDE 20 MG/ML IJ SOLN
INTRAMUSCULAR | Status: AC
  Filled 2017-05-07: qty 1

## 2017-05-07 MED ORDER — POVIDONE-IODINE 10 % EX SWAB: 2.0000 "application " | Freq: Once | CUTANEOUS | Status: DC

## 2017-05-07 SURGICAL SUPPLY — 52 items
BAG HAMPER (MISCELLANEOUS) ×2 IMPLANT
BANDAGE ELASTIC 6 LF NS (GAUZE/BANDAGES/DRESSINGS) ×2 IMPLANT
BANDAGE ELASTIC 6 VELCRO ST LF (GAUZE/BANDAGES/DRESSINGS) ×2 IMPLANT
BLADE AGGRESSIVE PLUS 4.0 (BLADE) ×2 IMPLANT
BLADE SURG SZ11 CARB STEEL (BLADE) ×2 IMPLANT
BUR BARRELL 4.0 (BURR) ×2 IMPLANT
CHLORAPREP W/TINT 26ML (MISCELLANEOUS) ×2 IMPLANT
CLOTH BEACON ORANGE TIMEOUT ST (SAFETY) ×2 IMPLANT
COOLER CRYO IC GRAV AND TUBE (ORTHOPEDIC SUPPLIES) ×2 IMPLANT
CUFF CRYO KNEE LG 20X31 COOLER (ORTHOPEDIC SUPPLIES) ×2 IMPLANT
CUFF TOURNIQUET SINGLE 34IN LL (TOURNIQUET CUFF) ×2 IMPLANT
CUTTER ANGLED DBL BITE 4.5 (BURR) ×2 IMPLANT
DECANTER SPIKE VIAL GLASS SM (MISCELLANEOUS) ×4 IMPLANT
GAUZE SPONGE 4X4 12PLY STRL (GAUZE/BANDAGES/DRESSINGS) ×2 IMPLANT
GAUZE SPONGE 4X4 16PLY XRAY LF (GAUZE/BANDAGES/DRESSINGS) ×2 IMPLANT
GAUZE XEROFORM 5X9 LF (GAUZE/BANDAGES/DRESSINGS) ×2 IMPLANT
GLOVE BIOGEL PI IND STRL 6.5 (GLOVE) ×1 IMPLANT
GLOVE BIOGEL PI IND STRL 7.0 (GLOVE) ×1 IMPLANT
GLOVE BIOGEL PI INDICATOR 6.5 (GLOVE) ×1
GLOVE BIOGEL PI INDICATOR 7.0 (GLOVE) ×1
GLOVE SKINSENSE NS SZ8.0 LF (GLOVE) ×1
GLOVE SKINSENSE STRL SZ8.0 LF (GLOVE) ×1 IMPLANT
GLOVE SS N UNI LF 8.5 STRL (GLOVE) ×2 IMPLANT
GOWN STRL REUS W/ TWL LRG LVL3 (GOWN DISPOSABLE) ×1 IMPLANT
GOWN STRL REUS W/TWL LRG LVL3 (GOWN DISPOSABLE) ×1
GOWN STRL REUS W/TWL XL LVL3 (GOWN DISPOSABLE) ×2 IMPLANT
HLDR LEG FOAM (MISCELLANEOUS) ×1 IMPLANT
IV NS IRRIG 3000ML ARTHROMATIC (IV SOLUTION) ×10 IMPLANT
KIT BLADEGUARD II DBL (SET/KITS/TRAYS/PACK) ×2 IMPLANT
KIT ROOM TURNOVER AP CYSTO (KITS) ×2 IMPLANT
LEG HOLDER FOAM (MISCELLANEOUS) ×1
MANIFOLD NEPTUNE II (INSTRUMENTS) ×2 IMPLANT
MARKER SKIN DUAL TIP RULER LAB (MISCELLANEOUS) ×2 IMPLANT
NEEDLE HYPO 18GX1.5 BLUNT FILL (NEEDLE) ×2 IMPLANT
NEEDLE HYPO 21X1.5 SAFETY (NEEDLE) ×2 IMPLANT
NEEDLE SPNL 18GX3.5 QUINCKE PK (NEEDLE) ×2 IMPLANT
NS IRRIG 1000ML POUR BTL (IV SOLUTION) ×2 IMPLANT
PACK ARTHRO LIMB DRAPE STRL (MISCELLANEOUS) ×2 IMPLANT
PAD ABD 5X9 TENDERSORB (GAUZE/BANDAGES/DRESSINGS) ×2 IMPLANT
PAD ARMBOARD 7.5X6 YLW CONV (MISCELLANEOUS) ×2 IMPLANT
PADDING CAST COTTON 6X4 STRL (CAST SUPPLIES) ×2 IMPLANT
PADDING WEBRIL 6 STERILE (GAUZE/BANDAGES/DRESSINGS) ×2 IMPLANT
PROBE BIPOLAR 50 DEGREE SUCT (MISCELLANEOUS) ×2 IMPLANT
PROBE BIPOLAR ATHRO 135MM 90D (MISCELLANEOUS) IMPLANT
SET ARTHROSCOPY INST (INSTRUMENTS) ×2 IMPLANT
SET ARTHROSCOPY PUMP TUBE (IRRIGATION / IRRIGATOR) ×2 IMPLANT
SET BASIN LINEN APH (SET/KITS/TRAYS/PACK) ×2 IMPLANT
SPONGE GAUZE 4X4 12PLY (GAUZE/BANDAGES/DRESSINGS) ×2 IMPLANT
SUT ETHILON 3 0 FSL (SUTURE) ×2 IMPLANT
SYR 30ML LL (SYRINGE) ×2 IMPLANT
SYRINGE 10CC LL (SYRINGE) ×2 IMPLANT
TUBE CONNECTING 12X1/4 (SUCTIONS) ×6 IMPLANT

## 2017-05-07 NOTE — Interval H&P Note (Signed)
History and Physical Interval Note:  05/07/2017 10:29 AM  Rhonda Patton  has presented today for surgery, with the diagnosis of medial and lateral menisectomy  The various methods of treatment have been discussed with the patient and family. After consideration of risks, benefits and other options for treatment, the patient has consented to  Procedure(s): KNEE ARTHROSCOPY WITH MEDIAL MENISECTOMY AND LATERAL MENISECTOMY (Left) as a surgical intervention .  The patient's history has been reviewed, patient examined, no change in status, stable for surgery.  I have reviewed the patient's chart and labs.  Questions were answered to the patient's satisfaction.     [name]

## 2017-05-07 NOTE — Anesthesia Postprocedure Evaluation (Signed)
Anesthesia Post Note  Patient: Rhonda Patton  Procedure(s) Performed: Procedure(s) (LRB): KNEE ARTHROSCOPY WITH MEDIAL MENISECTOMY AND LATERAL MENISECTOMY, limited debriedment (Left)  Patient location during evaluation: PACU Anesthesia Type: General Level of consciousness: awake and alert, patient cooperative and oriented Pain management: pain level controlled Vital Signs Assessment: post-procedure vital signs reviewed and stable Respiratory status: spontaneous breathing and respiratory function stable Cardiovascular status: stable Anesthetic complications: no     Last Vitals:  Vitals:   05/07/17 1300 05/07/17 1315  BP: 111/69 107/63  Pulse: 60 72  Resp: 18 18  Temp:      Last Pain:  Vitals:   05/07/17 1230  TempSrc:   PainSc: Asleep                 Leatrice Parilla A

## 2017-05-07 NOTE — Brief Op Note (Signed)
05/07/2017  12:20 PM  PATIENT:  Rhonda Patton  56 y.o. female  PRE-OPERATIVE DIAGNOSIS:  medial and lateral menisectomy  POST-OPERATIVE DIAGNOSIS:  medial and lateral meniscus tear, osteoarthritis  PROCEDURE:  Procedure(s): KNEE ARTHROSCOPY WITH MEDIAL MENISECTOMY AND LATERAL MENISECTOMY, limited debriedment (Left)  SURGEON:  Surgeon(s) and Role:    Carole Civil, MD - Primary  PHYSICIAN ASSISTANT:   ASSISTANTS: none   ANESTHESIA:   general  EBL:  Total I/O In: 1000 [I.V.:1000] Out: 10 [Blood:10]  BLOOD ADMINISTERED:none  DRAINS: none   LOCAL MEDICATIONS USED:  MARCAINE     SPECIMEN:  No Specimen  DISPOSITION OF SPECIMEN:  N/A  COUNTS:  YES  TOURNIQUET:    DICTATION: .Dragon Dictation  PLAN OF CARE: Discharge to home after PACU  PATIENT DISPOSITION:  PACU - hemodynamically stable.   Delay start of Pharmacological VTE agent (>24hrs) due to surgical blood loss or risk of bleeding: not applicable  [id number]

## 2017-05-07 NOTE — Transfer of Care (Signed)
Immediate Anesthesia Transfer of Care Note  Patient: Rhonda Patton  Procedure(s) Performed: Procedure(s): KNEE ARTHROSCOPY WITH MEDIAL MENISECTOMY AND LATERAL MENISECTOMY, limited debriedment (Left)  Patient Location: PACU  Anesthesia Type:General  Level of Consciousness: drowsy and patient cooperative  Airway & Oxygen Therapy: Patient Spontanous Breathing and Patient connected to face mask oxygen  Post-op Assessment: Report given to RN and Post -op Vital signs reviewed and stable  Post vital signs: Reviewed and stable  Last Vitals:  Vitals:   05/07/17 1110 05/07/17 1115  BP: 108/60   Pulse:    Resp: 18 (!) 24  Temp:      Last Pain:  Vitals:   05/07/17 1037  TempSrc: Oral      Patients Stated Pain Goal: 5 (83/37/44 5146)  Complications: No apparent anesthesia complications

## 2017-05-07 NOTE — Op Note (Signed)
05/07/2017  12:20 PM  PATIENT:  Rhonda Patton  56 y.o. female  PRE-OPERATIVE DIAGNOSIS:  medial and lateral menisectomy  POST-OPERATIVE DIAGNOSIS:  medial and lateral meniscus tear, osteoarthritis  PROCEDURE:  Procedure(s): KNEE ARTHROSCOPY WITH MEDIAL MENISECTOMY AND LATERAL MENISECTOMY, limited debriedment (Left)  Knee arthroscopy dictation  The patient was identified in the preoperative holding area using 2 approved identification mechanisms. The chart was reviewed and updated. The surgical site was confirmed as left  knee and marked with an indelible marker.  The patient was taken to the operating room for anesthesia. After successful  General  anesthesia, ancef  was used as IV antibiotics.  The patient was placed in the supine position with the (left leg ) the operative extremity in an arthroscopic leg holder and the opposite extremity in a padded leg holder.  The timeout was executed.  A lateral portal was established with an 11 blade and the scope was introduced into the joint. A diagnostic arthroscopy was performed in circumferential manner examining the entire knee joint. A medial portal was established and the diagnostic arthroscopy was repeated using a probe to palpate intra-articular structures as they were encountered.   The operative findings are   Medial grade 4 arthritis tibial and femoral side and torn medial meniscus Lateral lateral meniscal tear involving the entire free edge circumferentially Patellofemoral grade 4 chondral changes patella and trochlea Notch large osteophyte impinging medial femoral condyle from the anterior cruciate ligament attachment   The medial meniscus was resected using a duckbill forceps. The meniscal fragments were removed with a motorized shaver. The meniscus was balanced with a combination of a motorized shaver and a 50 ArthroCare wand until a stable rim was obtained.  The lateral meniscal tear was resected with a motorized  shaver  A bur was used to remove the osteophyte until there was no impingement in extension  The arthroscopic pump was placed on the wash mode and any excess debris was removed from the joint using suction.  60 cc of Marcaine with epinephrine was injected through the arthroscope.  The portals were closed with 3-0 nylon suture.  A sterile bandage, Ace wrap and Cryo/Cuff was placed and the Cryo/Cuff was activated. The patient was taken to the recovery room in stable condition.   SURGEON:  Surgeon(s) and Role:    * Carole Civil, MD - Primary  PHYSICIAN ASSISTANT:   ASSISTANTS: none   ANESTHESIA:   general  EBL:  Total I/O In: 1000 [I.V.:1000] Out: 10 [Blood:10]  BLOOD ADMINISTERED:none  DRAINS: none   LOCAL MEDICATIONS USED:  MARCAINE     SPECIMEN:  No Specimen  DISPOSITION OF SPECIMEN:  N/A  COUNTS:  YES  TOURNIQUET:    DICTATION: .Dragon Dictation  PLAN OF CARE: Discharge to home after PACU  PATIENT DISPOSITION:  PACU - hemodynamically stable.   Delay start of Pharmacological VTE agent (>24hrs) due to surgical blood loss or risk of bleeding: not applicable  [id number]

## 2017-05-07 NOTE — Anesthesia Procedure Notes (Signed)
Procedure Name: Intubation Date/Time: 05/07/2017 11:25 AM Performed by: Andree Elk, Roann Merk A Pre-anesthesia Checklist: Patient identified, Patient being monitored, Timeout performed, Emergency Drugs available and Suction available Patient Re-evaluated:Patient Re-evaluated prior to induction Oxygen Delivery Method: Circle System Utilized Preoxygenation: Pre-oxygenation with 100% oxygen Induction Type: IV induction, Cricoid Pressure applied and Rapid sequence Laryngoscope Size: Mac and 3 Grade View: Grade I Tube type: Oral Tube size: 7.0 mm Number of attempts: 1 Airway Equipment and Method: Stylet Placement Confirmation: ETT inserted through vocal cords under direct vision,  positive ETCO2 and breath sounds checked- equal and bilateral Secured at: 21 cm Tube secured with: Tape Dental Injury: Teeth and Oropharynx as per pre-operative assessment

## 2017-05-07 NOTE — Anesthesia Preprocedure Evaluation (Signed)
Anesthesia Evaluation  Patient identified by MRN, date of birth, ID band Patient awake    Reviewed: Allergy & Precautions, H&P , NPO status , Patient's Chart, lab work & pertinent test results  History of Anesthesia Complications (+) PONV and history of anesthetic complications  Airway Mallampati: I       Dental  (+) Teeth Intact   Pulmonary shortness of breath and with exertion, sleep apnea ,    breath sounds clear to auscultation       Cardiovascular hypertension, Pt. on medications + angina + Valvular Problems/Murmurs AI and MR  Rhythm:Regular Rate:Normal     Neuro/Psych Anxiety  Neuromuscular disease    GI/Hepatic GERD  Medicated and Controlled,  Endo/Other    Renal/GU      Musculoskeletal   Abdominal   Peds  Hematology   Anesthesia Other Findings   Reproductive/Obstetrics                             Anesthesia Physical Anesthesia Plan  ASA: III  Anesthesia Plan: General   Post-op Pain Management:    Induction: Intravenous, Rapid sequence and Cricoid pressure planned  PONV Risk Score and Plan:   Airway Management Planned: Oral ETT  Additional Equipment:   Intra-op Plan:   Post-operative Plan: Extubation in OR  Informed Consent: I have reviewed the patients History and Physical, chart, labs and discussed the procedure including the risks, benefits and alternatives for the proposed anesthesia with the patient or authorized representative who has indicated his/her understanding and acceptance.     Plan Discussed with:   Anesthesia Plan Comments:         Anesthesia Quick Evaluation

## 2017-05-08 ENCOUNTER — Encounter (HOSPITAL_COMMUNITY): Payer: Self-pay | Admitting: Orthopedic Surgery

## 2017-05-15 ENCOUNTER — Ambulatory Visit (INDEPENDENT_AMBULATORY_CARE_PROVIDER_SITE_OTHER): Payer: BLUE CROSS/BLUE SHIELD | Admitting: Orthopedic Surgery

## 2017-05-15 DIAGNOSIS — Z4889 Encounter for other specified surgical aftercare: Secondary | ICD-10-CM

## 2017-05-15 DIAGNOSIS — Z9889 Other specified postprocedural states: Secondary | ICD-10-CM

## 2017-05-15 MED ORDER — HYDROCODONE-ACETAMINOPHEN 10-325 MG PO TABS: 1.0000 | ORAL_TABLET | ORAL | 0 refills | Status: DC | PRN

## 2017-05-15 NOTE — Progress Notes (Signed)
POST OP VISIT  Encounter Diagnoses  Name Primary?  Marland Kitchen Aftercare following surgery Yes  . S/P arthroscopy of left knee     The patient had arthroscopy of the right knee she had severe arthritis and torn medial and lateral meniscus   Date of surgery 05/07/2017 PRE-OPERATIVE DIAGNOSIS:  medial and lateral menisectomy   POST-OPERATIVE DIAGNOSIS:  medial and lateral meniscus tear, osteoarthritis   PROCEDURE:  Procedure(s): KNEE ARTHROSCOPY WITH MEDIAL MENISECTOMY AND LATERAL MENISECTOMY, limited debriedment (Left)   SURGEON:  Surgeon(s) and Role:    Carole Civil, MD - Primary  She has a mild amount of swelling the calf is supple nontender. The portals are clean  The sutures were removed  She will work on swelling and knee flexion she is artery at 90. Continue weightbearing as tolerated use support as needed  Return in 3 weeks

## 2017-05-15 NOTE — Patient Instructions (Signed)
Continue to work on swelling and icing the knee  Continue knee bends 253 times a day for the next 3 weeks

## 2017-06-05 ENCOUNTER — Ambulatory Visit (INDEPENDENT_AMBULATORY_CARE_PROVIDER_SITE_OTHER): Payer: BLUE CROSS/BLUE SHIELD | Admitting: Orthopedic Surgery

## 2017-06-05 DIAGNOSIS — M659 Synovitis and tenosynovitis, unspecified: Secondary | ICD-10-CM

## 2017-06-05 DIAGNOSIS — Z4889 Encounter for other specified surgical aftercare: Secondary | ICD-10-CM | POA: Diagnosis not present

## 2017-06-05 DIAGNOSIS — Z9889 Other specified postprocedural states: Secondary | ICD-10-CM

## 2017-06-05 MED ORDER — PREDNISONE 10 MG PO TABS: 10.0000 mg | ORAL_TABLET | Freq: Two times a day (BID) | ORAL | 0 refills | Status: DC

## 2017-06-05 MED ORDER — HYDROCODONE-ACETAMINOPHEN 7.5-325 MG PO TABS: 1.0000 | ORAL_TABLET | Freq: Four times a day (QID) | ORAL | 0 refills | Status: DC | PRN

## 2017-06-05 NOTE — Progress Notes (Signed)
Postoperative appointment status post arthroscopy left knee  The patient is complaining of soreness in her left knee with giving way and lower back pain. She has a history of lumbar disc disease with degenerative arthritis.  Current medication hydrocodone 10 mg diclofenac 75 mg  It seems that her back is bothering her really more than the knee in terms of the giving way. Her arthroscopic showed no ligamentous instability. Her arthritis was quite severe but there is no other reason for her to give way except for weakness of the left leg  She is doing well in terms of keeping the swelling down is her knee had no effusion but she was tender about the knee and it was warm to touch there is no sign of infection  I recommended that we injected her knee with cortisone and put her on prednisone 10 mg twice a day for 3 weeks  She will continue ice and home exercises  She is able to walk with a slight limp but no assistive devices needed  Inject left knee   Procedure note left knee injection verbal consent was obtained to inject left knee joint  Timeout was completed to confirm the site of injection  The medications used were 40 mg of Depo-Medrol and 1% lidocaine 3 cc  Anesthesia was provided by ethyl chloride and the skin was prepped with alcohol.  After cleaning the skin with alcohol a 20-gauge needle was used to inject the left knee joint. There were no complications. A sterile bandage was applied.  PRE-OPERATIVE DIAGNOSIS:  medial and lateral menisectomy   POST-OPERATIVE DIAGNOSIS:  medial and lateral meniscus tear, osteoarthritis   PROCEDURE:  Procedure(s): KNEE ARTHROSCOPY WITH MEDIAL MENISECTOMY AND LATERAL MENISECTOMY, limited debriedment (Left)   SURGEON:  Surgeon(s) and Role:    Carole Civil, MD - Primary  The operative findings are   Medial grade 4 arthritis tibial and femoral side and torn medial meniscus Lateral lateral meniscal tear involving the entire free  edge circumferentially Patellofemoral grade 4 chondral changes patella and trochlea Notch large osteophyte impinging medial femoral condyle from the anterior cruciate ligament attachment  Encounter Diagnoses  Name Primary?  Marland Kitchen Aftercare following surgery Yes  . S/P arthroscopy of left knee   . Synovitis of left knee

## 2017-06-05 NOTE — Patient Instructions (Addendum)
CONTINUE EXERCISES AND ICE   START  Meds ordered this encounter  Medications  . predniSONE (DELTASONE) 10 MG tablet    Sig: Take 1 tablet (10 mg total) by mouth 2 (two) times daily with a meal.    Dispense:  60 tablet    Refill:  0   (PICK UP AT [facility])

## 2017-06-29 ENCOUNTER — Other Ambulatory Visit: Payer: Self-pay | Admitting: Orthopedic Surgery

## 2017-06-29 ENCOUNTER — Other Ambulatory Visit: Payer: Self-pay | Admitting: Medical

## 2017-06-30 NOTE — Telephone Encounter (Signed)
???    Me lasix???  PMD

## 2017-07-01 ENCOUNTER — Telehealth: Payer: Self-pay | Admitting: Orthopedic Surgery

## 2017-07-01 ENCOUNTER — Other Ambulatory Visit: Payer: Self-pay | Admitting: Orthopedic Surgery

## 2017-07-01 MED ORDER — HYDROCODONE-ACETAMINOPHEN 5-325 MG PO TABS: 1.0000 | ORAL_TABLET | Freq: Four times a day (QID) | ORAL | 0 refills | Status: DC | PRN

## 2017-07-01 NOTE — Telephone Encounter (Signed)
Routing to dr harrison 

## 2017-07-01 NOTE — Telephone Encounter (Signed)
Patient requests refill on Hydrocodone/Acetaminophen 7.5-325  Mgs.   Qty  42        Sig: Take 1 tablet by mouth every 6 (six) hours as needed for moderate pain.

## 2017-07-03 ENCOUNTER — Ambulatory Visit: Payer: BLUE CROSS/BLUE SHIELD | Admitting: Orthopedic Surgery

## 2017-07-07 ENCOUNTER — Ambulatory Visit (INDEPENDENT_AMBULATORY_CARE_PROVIDER_SITE_OTHER): Payer: BLUE CROSS/BLUE SHIELD | Admitting: Orthopedic Surgery

## 2017-07-07 ENCOUNTER — Encounter: Payer: Self-pay | Admitting: Orthopedic Surgery

## 2017-07-07 VITALS — BP 128/86 | HR 73 | Ht 64.0 in | Wt 252.0 lb

## 2017-07-07 DIAGNOSIS — Z9889 Other specified postprocedural states: Secondary | ICD-10-CM

## 2017-07-07 DIAGNOSIS — M792 Neuralgia and neuritis, unspecified: Secondary | ICD-10-CM

## 2017-07-07 DIAGNOSIS — Z4889 Encounter for other specified surgical aftercare: Secondary | ICD-10-CM

## 2017-07-07 MED ORDER — GABAPENTIN 100 MG PO CAPS: ORAL_CAPSULE | ORAL | 0 refills | Status: DC

## 2017-07-07 NOTE — Progress Notes (Signed)
Routine postop visit  Date of surgery 05/07/2017  This is postop day #63  The patient plans a pain behind her leg difficulty walking giving way and then pain over both portal sites  She is able to tolerate knee exercises at home she can bend and straighten her knee very well she has a trace joint effusion.  She has tenderness in the posterior part of her leg starting at the upper thigh and down into the lower calf and leg.  She had no tenderness in the lower back mild tenderness in the left gluteal area.  Encounter Diagnoses  Name Primary?  Marland Kitchen Aftercare following surgery   . S/P arthroscopy of left knee   . Neuralgia and neuritis Yes   Patient will return in a week after starting gabapentin 200 mg every 8

## 2017-07-15 DIAGNOSIS — R7303 Prediabetes: Secondary | ICD-10-CM | POA: Diagnosis not present

## 2017-07-15 DIAGNOSIS — J449 Chronic obstructive pulmonary disease, unspecified: Secondary | ICD-10-CM | POA: Diagnosis not present

## 2017-07-15 DIAGNOSIS — Z8249 Family history of ischemic heart disease and other diseases of the circulatory system: Secondary | ICD-10-CM | POA: Diagnosis not present

## 2017-07-15 DIAGNOSIS — Z79890 Hormone replacement therapy (postmenopausal): Secondary | ICD-10-CM | POA: Diagnosis not present

## 2017-07-15 DIAGNOSIS — I10 Essential (primary) hypertension: Secondary | ICD-10-CM | POA: Diagnosis not present

## 2017-07-15 DIAGNOSIS — R05 Cough: Secondary | ICD-10-CM | POA: Diagnosis not present

## 2017-07-15 DIAGNOSIS — R0602 Shortness of breath: Secondary | ICD-10-CM | POA: Diagnosis not present

## 2017-07-15 DIAGNOSIS — J40 Bronchitis, not specified as acute or chronic: Secondary | ICD-10-CM | POA: Diagnosis not present

## 2017-07-15 DIAGNOSIS — K449 Diaphragmatic hernia without obstruction or gangrene: Secondary | ICD-10-CM | POA: Diagnosis not present

## 2017-07-15 DIAGNOSIS — M810 Age-related osteoporosis without current pathological fracture: Secondary | ICD-10-CM | POA: Diagnosis not present

## 2017-07-15 DIAGNOSIS — M1990 Unspecified osteoarthritis, unspecified site: Secondary | ICD-10-CM | POA: Diagnosis not present

## 2017-07-15 DIAGNOSIS — R079 Chest pain, unspecified: Secondary | ICD-10-CM | POA: Diagnosis not present

## 2017-07-15 DIAGNOSIS — R7989 Other specified abnormal findings of blood chemistry: Secondary | ICD-10-CM | POA: Diagnosis not present

## 2017-07-17 ENCOUNTER — Encounter: Payer: Self-pay | Admitting: Orthopedic Surgery

## 2017-07-17 ENCOUNTER — Ambulatory Visit (INDEPENDENT_AMBULATORY_CARE_PROVIDER_SITE_OTHER): Payer: BLUE CROSS/BLUE SHIELD | Admitting: Orthopedic Surgery

## 2017-07-17 VITALS — BP 119/82 | HR 62 | Ht 64.0 in | Wt 253.0 lb

## 2017-07-17 DIAGNOSIS — M5126 Other intervertebral disc displacement, lumbar region: Secondary | ICD-10-CM

## 2017-07-17 DIAGNOSIS — Z4889 Encounter for other specified surgical aftercare: Secondary | ICD-10-CM | POA: Diagnosis not present

## 2017-07-17 DIAGNOSIS — M792 Neuralgia and neuritis, unspecified: Secondary | ICD-10-CM | POA: Diagnosis not present

## 2017-07-17 DIAGNOSIS — Z9889 Other specified postprocedural states: Secondary | ICD-10-CM | POA: Diagnosis not present

## 2017-07-17 MED ORDER — HYDROCODONE-ACETAMINOPHEN 5-325 MG PO TABS: 1.0000 | ORAL_TABLET | Freq: Four times a day (QID) | ORAL | 0 refills | Status: DC | PRN

## 2017-07-17 NOTE — Progress Notes (Addendum)
Postoperative knee arthroscopy  Date of surgery July 19 this is a 36  She has been having persistent knee pain after the surgery. She is also persistent swelling. We treated her last time for neuralgia. She started gabapentin 200 mg every 8 hours  Encounter Diagnoses  Name Primary?  Marland Kitchen Aftercare following surgery Yes  . S/P arthroscopy of left knee   . Neuralgia and neuritis    First I will address the arthroscopic surgery. The patient has regained 125 of knee flexion with full extension she has mild tenderness over the medial portal no effusion  Her knee is stable  She has had 6 weeks now of pain in the back of her leg I believe to be brought on by her chronic lumbar disc disease in the antalgic gait related to her knee surgery.  She complains of a radicular pain down the posterior aspect of her left leg down to her foot associated with numbness tingling weakness giving way and back pain  Review of systems bowel and bladder function are intact  Exam she walks with a cane she has an antalgic gait she has weakness of the dorsiflexors of her left foot compared to the right she has an asymmetrical reflex on the left at the knee with 1 versus 2 on the right she has painless range of motion of her left knee but tenderness in her lower back positive straight leg raise positive Lasegue's sign and positive cross leg straight leg raise for radicular pain on the left skin is intact portal sites are healed no knee effusion pulses are good mild edema she is alert and oriented 3 her mood is depressed her affect is flat secondary to pain   Imaging study from 2015 for lower back pain show that she has a spondylolisthesis at L4-5 with significant joint space narrowing  In 2016 she had L1-L5 degenerative disc disease with anterolisthesis of L4 on 5 with foraminal stenosis on the left mild  She is obviously have progressive disease and now has asymmetric reflex weakness and a positive straight leg raise  will need new imaging. Her options are surgical versus epidural injection and medication  She has been on oral analgesics for greater than 6 weeks, gabapentin for the last 2 weeks we tried anti-inflammatories for 4 weeks

## 2017-07-23 ENCOUNTER — Ambulatory Visit (HOSPITAL_COMMUNITY)
Admission: RE | Admit: 2017-07-23 | Discharge: 2017-07-23 | Disposition: A | Discharge status: Home / Self Care | Payer: BLUE CROSS/BLUE SHIELD | Source: Ambulatory Visit | Attending: Orthopedic Surgery | Admitting: Orthopedic Surgery

## 2017-07-23 DIAGNOSIS — M5126 Other intervertebral disc displacement, lumbar region: Secondary | ICD-10-CM

## 2017-07-24 ENCOUNTER — Telehealth: Payer: Self-pay | Admitting: Orthopedic Surgery

## 2017-07-24 NOTE — Telephone Encounter (Signed)
Pt states she could not do the MRI at [facility].  She wants to reschedule this at an open unit.  Would you please schedule MRI and then call her with appointment for MRI and return appointment here?  Thanks

## 2017-07-25 DIAGNOSIS — K449 Diaphragmatic hernia without obstruction or gangrene: Secondary | ICD-10-CM | POA: Diagnosis not present

## 2017-07-25 DIAGNOSIS — I10 Essential (primary) hypertension: Secondary | ICD-10-CM | POA: Diagnosis not present

## 2017-07-25 DIAGNOSIS — J40 Bronchitis, not specified as acute or chronic: Secondary | ICD-10-CM | POA: Diagnosis not present

## 2017-07-25 DIAGNOSIS — K219 Gastro-esophageal reflux disease without esophagitis: Secondary | ICD-10-CM | POA: Diagnosis not present

## 2017-07-27 ENCOUNTER — Ambulatory Visit: Payer: BLUE CROSS/BLUE SHIELD | Admitting: Orthopedic Surgery

## 2017-07-28 ENCOUNTER — Encounter: Payer: Self-pay | Admitting: Nurse Practitioner

## 2017-07-28 ENCOUNTER — Ambulatory Visit (INDEPENDENT_AMBULATORY_CARE_PROVIDER_SITE_OTHER): Payer: BLUE CROSS/BLUE SHIELD | Admitting: Nurse Practitioner

## 2017-07-28 ENCOUNTER — Other Ambulatory Visit: Payer: Self-pay

## 2017-07-28 ENCOUNTER — Telehealth: Payer: Self-pay

## 2017-07-28 DIAGNOSIS — K219 Gastro-esophageal reflux disease without esophagitis: Secondary | ICD-10-CM

## 2017-07-28 DIAGNOSIS — K625 Hemorrhage of anus and rectum: Secondary | ICD-10-CM

## 2017-07-28 DIAGNOSIS — R1013 Epigastric pain: Secondary | ICD-10-CM

## 2017-07-28 DIAGNOSIS — R1310 Dysphagia, unspecified: Secondary | ICD-10-CM | POA: Diagnosis not present

## 2017-07-28 MED ORDER — PEG 3350-KCL-NA BICARB-NACL 420 G PO SOLR: 4000.0000 mL | ORAL | 0 refills | Status: DC

## 2017-07-28 NOTE — Patient Instructions (Addendum)
1. Avoid NSAIDs. These include ibuprofen, Motrin, Advil, Aleve, naproxen, Naprosyn, and any medication with "NSAID" on the bottle. 2. Keep taking your Protonix that Dr. Nevada Crane prescribed. 3. We will request your previous colonoscopy records from Dr. Anthony Sar in [place], [place]. 4. I recommend eating a soft diet until we can complete your procedures. This will help prevent food from getting stuck. Further information is below. 5. We will schedule your procedures for you. 6. Further recommendations will be made after your procedures. 7. Return for follow-up in 3 months. 8. Call us if you have any questions or concerns.    Dysphagia Diet Level 3, Mechanically Advanced The dysphagia level 3 diet includes foods that are soft, moist, and can be chopped into 1-inch chunks. This diet is helpful for people with mild swallowing difficulties. It reduces the risk of food getting caught in the windpipe, trachea, or lungs. What do I need to know about this diet?  You may eat foods that are soft and moist.  If you were on the dysphagia level 1 or level 2 diets, you may eat any of the foods included on those lists.  Avoid foods that are dry, hard, sticky, chewy, coarse, and crunchy. Also avoid large cuts of food.  Take small bites. Each bite should contain 1 inch or less of food.  Thicken liquids if instructed by your health care provider. Follow your health care provider's instructions on how to do this and to what consistency.  See your dietitian or speech language pathologist regularly for help with your dietary changes. What foods can I eat? Grains Moist breads without nuts or seeds. Biscuits, muffins, pancakes, and waffles well-moistened with syrup, jelly, margarine, or butter. Smooth cereals with plenty of milk to moisten them. Moist bread stuffing. Moist rice. Vegetables All cooked, soft vegetables. Shredded lettuce. Tender fried potatoes. Fruits All canned and cooked fruits. Soft, peeled  fresh fruits, such as peaches, nectarines, kiwis, cantaloupe, honeydew melon, and watermelon without seeds. Soft berries, such as strawberries. Meat and Other Protein Sources Moist ground or finely diced or sliced meats. Solid, tender cuts of meat. Meatloaf. Hamburger with a bun. Sausage patty. Deli thin-sliced lunch meat. Chicken, egg, or tuna salad sandwich. Sloppy joe. Moist fish. Eggs prepared any way. Casseroles with small chunks of meats, ground meats, or tender meats. Dairy Cheese spreads without coarse large chunks. Shredded cheese. Cheese slices. Cottage cheese. Milk at the right texture. Smooth frappes. Yogurt without nuts or coconut. Ask your health care provider whether you can have frozen desserts (such as malts or milk shakes) and thin liquids. Sweets/Desserts Soft, smooth, moist desserts. Non-chewy, smooth candy. Jam. Jelly. Honey. Preserves. Ask your health care provider whether you can have frozen desserts. Fats and Oils Butter. Oils. Margarine. Mayonnaise. Gravy. Spreads. Other All seasonings and sweeteners. All sauces without large chunks. The items listed above may not be a complete list of recommended foods or beverages. Contact your dietitian for more options. What foods are not recommended? Grains Coarse or dry cereals. Dry breads. Toast. Crackers. Tough, crusty breads, such French bread and baguettes. Tough, crisp fried potatoes. Potato skins. Dry bread stuffing. Granola. Popcorn. Chips. Vegetables All raw vegetables except shredded lettuce. Cooked corn. Rubbery or stiff cooked vegetables. Stringy vegetables, such as celery. Fruits Hard fruits that are difficult to chew, such as apples or pears. Stringy, high-pulp fruits, such as pineapple, papaya, or mango. Fruits with tough skins, such as grapes. Coconut. All dried fruits. Fruit leather. Fruit roll-ups. Fruit snacks. Meat and  Other Protein Sources Dry or tough meats or poultry. Dry fish. Fish with bones. Peanut butter.  All nuts and seeds. Dairy Any with nuts, seeds, chocolate chips, dried fruit, coconut, or pineapple. Sweets/Desserts Dry cakes. Chewy or dry cookies. Any with nuts, seeds, dry fruits, coconut, pineapple, or anything dry, sticky, or hard. Chewy caramel. Licorice. Taffy-type candies. Ask your health care provider whether you can have frozen desserts. Fats and Oils Any with chunks, nuts, seeds, or pineapple. Olives. Angie Fava. Other Soups with tough or large chunks of meats, poultry, or vegetables. Corn or clam chowder. The items listed above may not be a complete list of foods and beverages to avoid. Contact your dietitian for more information. This information is not intended to replace advice given to you by your health care provider. Make sure you discuss any questions you have with your health care provider. Document Released: 10/06/2005 Document Revised: 03/13/2016 Document Reviewed: 09/19/2013 Elsevier Interactive Patient Education  [employer].

## 2017-07-28 NOTE — Progress Notes (Signed)
cc'ed to pcp °

## 2017-07-28 NOTE — Assessment & Plan Note (Signed)
The patient notes rectal bleeding about at least once every month for the previous year. Her last colonoscopy was about 8 years ago with Dr. Cornelia Copa and eaten. We will request these records. She does have a primary relative family history of colon cancer in her mother who was diagnosed in her mid 45s. She passed away from congestive heart failure and not colon cancer. She is generally overdue for repeat colonoscopy based on this. We will plan for colonoscopy to further evaluate her rectal bleeding as well as screening for colon cancer. Return for follow-up in 3 months.  Proceed with colonoscopy on propofol/MAC with Dr. Oneida Alar in the near future. The risks, benefits, and alternatives have been discussed in detail with the patient. They state understanding and desire to proceed.   Patient is currently on Neurontin and hydrocodone. She is also morbidly obese. No other anticoagulants, anxiolytics, chronic pain medications, or antidepressants. We will plan for the procedure on file/MAC to promote adequate sedation due to body habitus and polypharmacy.

## 2017-07-28 NOTE — Telephone Encounter (Signed)
Changed location with [employer]. Order was faxed to [facility] in [place]. Left message for patient to return my call. Tell patient to ask for MRI disc.

## 2017-07-28 NOTE — Progress Notes (Addendum)
REVIEWED-NO ADDITIONAL RECOMMENDATIONS.  Primary Care Physician:  Celene Squibb, MD Primary Gastroenterologist:  Dr. Oneida Alar  Chief Complaint  Patient presents with  . Abdominal Pain    x 1 year  . Hiatal Hernia    HPI:   Rhonda Patton is a 56 y.o. female who presents On referral from primary care for abdominal pain and hematochezia. The patient was last seen by primary care on 07/25/2017. Noted hiatal hernia/GERD and was placed on Zantac by the emergency department but was transitioned to Protonix by primary care. Also noted blood in stools for about 1 year with last colonoscopy several years ago by Dr. Cornelia Copa. Hemoglobin 13.2 with no signs of iron deficiency but was referred for further evaluation.  ED department at [facility] records reviewed. Patient with abdominal pain and noted white blood cell count of 4.4 which is normal, hemoglobin 13.2, platelet count normal at 181. She was discharged on a prednisone taper and Zantac.  CT of the chest was completed at that time which indicated consistent with asthma, small hiatal hernia, old granulomatous disease.   Today she states she's doing ok overall. Her heartburn symptoms include esophageal burning after each meal, worse at night; feels like she's having a hearty attack when it happens (was ruled out for cardiac etiology at ER with troponins and EKG). Has nausea with that as well; at night often can't lay flat, symptoms will improved with self-induced vomiting in the worse cases. Also with odynophagia and dysphagia and includes with pretty much all solid foods. Her abdominal pain is epigastric and minimal improvement with PPI ("but I just started it 3 days ago). Also will have lower abdominal soreness when she needs to have a bowel movement and relieves after bowel movement. Has a bowel movement about every day to every 3 days. Her stools vary, lately has been having greasy fecal matter. Having once a month rectal bleeding with bowel  movements, no constipation with bleeding; occurring about once a month and for the past year. Last colonoscopy was "about 8 years ago" with Dr. Anthony Sar in [place]. Denies fever, chills, unintentional weight loss. Feels she has a boil in her rectum that develops about every 3 weeks and eventually "bursts". Denies chest pain, dyspnea, dizziness, lightheadedness, syncope, near syncope. Denies any other upper or lower GI symptoms.  Minimal Ibuprofen use, about once a month or less. Denies ASA powders.  Past Medical History:  Diagnosis Date  . Angina    occasional  . Anxiety   . Arthritis   . Borderline diabetes   . Carpal tunnel syndrome of right wrist   . GERD (gastroesophageal reflux disease)   . Hypercholesteremia   . Hypertension   . Panic attack   . PONV (postoperative nausea and vomiting)   . Sleep apnea    diagnosed but doesn't use CPAP; never got CPAP.    Past Surgical History:  Procedure Laterality Date  . ABDOMINAL HYSTERECTOMY    . CESAREAN SECTION     x 3  . CHOLECYSTECTOMY    . FOOT SURGERY Right    x 2-bone spur  . HAND SURGERY Right    x 2-carpal tunnel  . KNEE ARTHROSCOPY Left   . KNEE ARTHROSCOPY WITH MEDIAL MENISECTOMY Left 05/07/2017   Procedure: KNEE ARTHROSCOPY WITH MEDIAL MENISECTOMY AND LATERAL MENISECTOMY, limited debriedment;  Surgeon: Carole Civil, MD;  Location: [facility];  Service: Orthopedics;  Laterality: Left;  . PARTIAL HYSTERECTOMY  2001   total abdominal  . TRIGGER  FINGER RELEASE Right 11/27/2016   Procedure: RELEASE TRIGGER FINGER/A-1 PULLEY RIGHT LONG FINGER;  Surgeon: Carole Civil, MD;  Location: [facility];  Service: Orthopedics;  Laterality: Right;  pt knows to arrive at 8:45    Current Outpatient Prescriptions  Medication Sig Dispense Refill  . amLODipine (NORVASC) 5 MG tablet Take 1 tablet (5 mg total) by mouth daily. 90 tablet 1  . atorvastatin (LIPITOR) 40 MG tablet Take 1 tablet (40 mg total) by mouth daily. (Patient taking  differently: Take 40 mg by mouth daily at 6 PM. ) 90 tablet 1  . diclofenac (VOLTAREN) 75 MG EC tablet Take 1 tablet (75 mg total) by mouth 2 (two) times daily. 60 tablet 0  . estradiol (ESTRACE) 0.5 MG tablet Take 0.5 mg by mouth daily.     . furosemide (LASIX) 20 MG tablet Take 1 tablet (20 mg total) by mouth every morning. (Patient taking differently: Take 20 mg by mouth daily. ) 90 tablet 0  . gabapentin (NEURONTIN) 100 MG capsule Take 2 tablets every 8 hours 60 capsule 0  . HYDROcodone-acetaminophen (NORCO) 5-325 MG tablet Take 1 tablet by mouth every 6 (six) hours as needed for moderate pain. 30 tablet 0  . ibuprofen (ADVIL,MOTRIN) 800 MG tablet Take 1 tablet (800 mg total) by mouth 3 (three) times daily. (Patient taking differently: Take 800 mg by mouth 3 (three) times daily as needed for mild pain. ) 30 tablet 0  . pantoprazole (PROTONIX) 40 MG tablet Take 40 mg by mouth daily.  2  . potassium chloride (K-DUR) 10 MEQ tablet Take 1 tablet (10 mEq total) by mouth daily. 90 tablet 0  . predniSONE (DELTASONE) 10 MG tablet Take 1 tablet (10 mg total) by mouth 2 (two) times daily with a meal. (Patient taking differently: Take 10 mg by mouth as needed. ) 60 tablet 0  . ranitidine (ZANTAC) 75 MG tablet Take 1 tablet (75 mg total) by mouth every evening. 30 tablet 2   No current facility-administered medications for this visit.     Allergies as of 07/28/2017 - Review Complete 07/28/2017  Allergen Reaction Noted  . Codeine Shortness Of Breath and Nausea And Vomiting 08/24/2014  . Aspirin Other (See Comments)     Family History  Problem Relation Age of Onset  . Heart disease Unknown   . Arthritis Unknown   . Cancer Unknown   . Diabetes Unknown   . Colon cancer Mother 76  . Gastric cancer Neg Hx   . Esophageal cancer Neg Hx     Social History   Social History  . Marital status: Married    Spouse name: N/A  . Number of children: N/A  . Years of education: 10   Occupational History    . Not on file.   Social History Main Topics  . Smoking status: Never Smoker  . Smokeless tobacco: Never Used  . Alcohol use No  . Drug use: No  . Sexual activity: Not Currently    Birth control/ protection: Surgical   Other Topics Concern  . Not on file   Social History Narrative  . No narrative on file    Review of Systems: Complete ROS negative except as per HPI.    Physical Exam: BP 133/83   Pulse (!) 58   Temp (!) 97.1 F (36.2 C) (Oral)   Ht 5\' 3"  (1.6 m)   Wt 253 lb 9.6 oz (115 kg)   BMI 44.92 kg/m  General:   Morbidly  obese female. Alert and oriented. Pleasant and cooperative. Well-nourished and well-developed.  Head:  Normocephalic and atraumatic. Eyes:  Without icterus, sclera clear and conjunctiva pink.  Ears:  Normal auditory acuity. Cardiovascular:  S1, S2 present without murmurs appreciated. Extremities without clubbing or edema. Respiratory:  Clear to auscultation bilaterally. No wheezes, rales, or rhonchi. No distress.  Gastrointestinal:  +BS, obese but soft, and non-distended. Moderate epigastric TTP noted. No HSM noted. No guarding or rebound. No masses appreciated.  Rectal:  Deferred  Musculoskalatal:  Symmetrical without gross deformities. Skin:  Intact without significant lesions or rashes. Neurologic:  Alert and oriented x4;  grossly normal neurologically. Psych:  Alert and cooperative. Normal mood and affect. Heme/Lymph/Immune: No excessive bruising noted.    07/28/2017 11:56 AM   Disclaimer: This note was dictated with voice recognition software. Similar sounding words can inadvertently be transcribed and may not be corrected upon review.

## 2017-07-28 NOTE — Assessment & Plan Note (Signed)
Noted epigastric burning pain. States when she has an episode it feels like "I'm having a heart attack." She was evaluated by the emergency department at [facility] and ruled out for cardiac etiology via troponin levels and EKG. This is likely due to GERD symptoms as per above. Further GERD management as per above. Return for follow-up in 3 months.

## 2017-07-28 NOTE — Telephone Encounter (Signed)
Called and informed pt of pre-op appt 08/20/17 at 11:00am. Letter mailed.

## 2017-07-28 NOTE — Assessment & Plan Note (Signed)
Noted dysphagia symptoms as well as GERD, as per above. She has odynophagia as well. Dysphagia occurs with most foods nothing she can thinks of his worse. Severe episodes will have to self induced vomiting in order to clear her esophagus. I recommended a soft diet for now to prevent food impaction. We can add an upper endoscopy onto her colonoscopy for further evaluation. Return for follow-up in 3 months.  Proceed with EGD +/- dilation on propofol/MAC with Dr. Oneida Alar in near future: the risks, benefits, and alternatives have been discussed with the patient in detail. The patient states understanding and desires to proceed.  Patient is currently on Neurontin and hydrocodone. She is also morbidly obese. No other anticoagulants, anxiolytics, chronic pain medications, or antidepressants. We will plan for the procedure on file/MAC to promote adequate sedation due to body habitus and polypharmacy.

## 2017-07-28 NOTE — Assessment & Plan Note (Signed)
The patient has GERD symptoms and was placed on Zantac by the emergency department. This was switched to Protonix 40 mg once daily by her primary care. She is only been on it for 3 days and subsequently not noting much of an improvement yet. Due to the limited course she has been on I have recommended she continue Protonix to allow an adequate evaluation of palliative affect. Further evaluation by endoscopy as per below. Return for follow-up in 3 months.

## 2017-08-04 ENCOUNTER — Telehealth: Payer: Self-pay | Admitting: Orthopedic Surgery

## 2017-08-04 NOTE — Telephone Encounter (Signed)
Patient called to request refill:  HYDROcodone-acetaminophen (NORCO) 5-325 MG tablet 30 tablet

## 2017-08-04 NOTE — Telephone Encounter (Signed)
Spoke with patient 08/03/17. Done. Patient aware of appointments and to bring MRI disc.

## 2017-08-05 ENCOUNTER — Other Ambulatory Visit: Payer: Self-pay | Admitting: Orthopedic Surgery

## 2017-08-05 DIAGNOSIS — M4726 Other spondylosis with radiculopathy, lumbar region: Secondary | ICD-10-CM | POA: Diagnosis not present

## 2017-08-05 DIAGNOSIS — M5117 Intervertebral disc disorders with radiculopathy, lumbosacral region: Secondary | ICD-10-CM | POA: Diagnosis not present

## 2017-08-05 DIAGNOSIS — M5116 Intervertebral disc disorders with radiculopathy, lumbar region: Secondary | ICD-10-CM | POA: Diagnosis not present

## 2017-08-05 DIAGNOSIS — M4727 Other spondylosis with radiculopathy, lumbosacral region: Secondary | ICD-10-CM | POA: Diagnosis not present

## 2017-08-05 MED ORDER — HYDROCODONE-ACETAMINOPHEN 5-325 MG PO TABS: 1.0000 | ORAL_TABLET | Freq: Four times a day (QID) | ORAL | 0 refills | Status: DC | PRN

## 2017-08-07 DIAGNOSIS — Z1159 Encounter for screening for other viral diseases: Secondary | ICD-10-CM | POA: Diagnosis not present

## 2017-08-07 DIAGNOSIS — R7301 Impaired fasting glucose: Secondary | ICD-10-CM | POA: Diagnosis not present

## 2017-08-07 DIAGNOSIS — Z0000 Encounter for general adult medical examination without abnormal findings: Secondary | ICD-10-CM | POA: Diagnosis not present

## 2017-08-10 ENCOUNTER — Ambulatory Visit (INDEPENDENT_AMBULATORY_CARE_PROVIDER_SITE_OTHER): Payer: BLUE CROSS/BLUE SHIELD | Admitting: Orthopedic Surgery

## 2017-08-10 VITALS — BP 130/87 | HR 64 | Ht 63.0 in | Wt 253.0 lb

## 2017-08-10 DIAGNOSIS — M9983 Other biomechanical lesions of lumbar region: Secondary | ICD-10-CM | POA: Diagnosis not present

## 2017-08-10 DIAGNOSIS — M47816 Spondylosis without myelopathy or radiculopathy, lumbar region: Secondary | ICD-10-CM | POA: Diagnosis not present

## 2017-08-10 DIAGNOSIS — M48061 Spinal stenosis, lumbar region without neurogenic claudication: Secondary | ICD-10-CM

## 2017-08-10 DIAGNOSIS — M5136 Other intervertebral disc degeneration, lumbar region: Secondary | ICD-10-CM

## 2017-08-10 NOTE — Progress Notes (Signed)
Progress Note   Patient ID: Rhonda Patton, female   DOB: [birth year], 56 y.o.   MRN: [id number]  Chief Complaint  Patient presents with  . Follow-up    MRI results of back.    HPI 56 years old status post arthroscopy left knee she had a medial lateral meniscectomy July 19  She had grade 4 arthritis on the medial side tibia and femur grade 4 arthritis patellar area;  She complains of anterior knee pain medial knee pain swelling inability to ambulate  I sent her for MRI does show quite a bit of significant spinal stenosis and she has back pain without any serious radicular pain that we can elicit.  Review of Systems  Musculoskeletal:       Back pain without leg pain   No outpatient prescriptions have been marked as taking for the 08/10/17 encounter (Office Visit) with Carole Civil, MD.    Allergies  Allergen Reactions  . Codeine Shortness Of Breath and Nausea And Vomiting  . Aspirin Other (See Comments)    REACTION: stomach upset     BP 130/87   Pulse 64   Ht 5\' 3"  (1.6 m)   Wt 253 lb (114.8 kg)   BMI 44.82 kg/m   Physical Exam   Gen. appearance the patient's appearance is normal with normal grooming and  hygiene The patient is oriented to person place and time Mood and affect are normal  BP 130/87   Pulse 64   Ht 5\' 3"  (1.6 m)   Wt 253 lb (114.8 kg)   BMI 44.82 kg/m  Ortho Exam She comes in with a cane she has a severe limp she is tender around the patella area has tendons quadriceps tendon and medial joint line, she has a small to moderate joint effusion. She has mild pain with flexion of the knee in terminal flexion   She has tenderness in the middle right and left side of lower back without pain in the gluteal area   Medical decision-making Encounter Diagnoses  Name Primary?  . DDD (degenerative disc disease), lumbar Yes  . Foraminal stenosis of lumbar region   . Facet arthropathy, lumbar    Plan is for her to see neurosurgery. If they  did not recommend any intervention surgically then I would push her towards getting the epidural injections to remove the back as a source of leg pain and then after that is completed proceed with left knee replacement   [name], MD 08/10/2017 12:16 PM

## 2017-08-12 DIAGNOSIS — R7301 Impaired fasting glucose: Secondary | ICD-10-CM | POA: Diagnosis not present

## 2017-08-12 DIAGNOSIS — M810 Age-related osteoporosis without current pathological fracture: Secondary | ICD-10-CM | POA: Diagnosis not present

## 2017-08-12 DIAGNOSIS — G4733 Obstructive sleep apnea (adult) (pediatric): Secondary | ICD-10-CM | POA: Diagnosis not present

## 2017-08-12 DIAGNOSIS — K449 Diaphragmatic hernia without obstruction or gangrene: Secondary | ICD-10-CM | POA: Diagnosis not present

## 2017-08-12 DIAGNOSIS — Z0000 Encounter for general adult medical examination without abnormal findings: Secondary | ICD-10-CM | POA: Diagnosis not present

## 2017-08-18 NOTE — Patient Instructions (Signed)
Rhonda Patton  08/18/2017     @PREFPERIOPPHARMACY @   Your procedure is scheduled on  08/25/2017 .  Report to [facility] at  [place]  A.M.  Call this number if you have problems the morning of surgery:  [phone]   Remember:  Do not eat food or drink liquids after midnight.  Take these medicines the morning of surgery with A SIP OF WATER  Hydrocodone, protonix.   Do not wear jewelry, make-up or nail polish.  Do not wear lotions, powders, or perfumes, or deoderant.  Do not shave 48 hours prior to surgery.  Men may shave face and neck.  Do not bring valuables to the hospital.  [facility] is not responsible for any belongings or valuables.  Contacts, dentures or bridgework may not be worn into surgery.  Leave your suitcase in the car.  After surgery it may be brought to your room.  For patients admitted to the hospital, discharge time will be determined by your treatment team.  Patients discharged the day of surgery will not be allowed to drive home.   Name and phone number of your driver:   family Special instructions:  Follow the diet and prep instructions given to you by Dr Nona Dell office.  Please read over the following fact sheets that you were given. Anesthesia Post-op Instructions and Care and Recovery After Surgery       Esophagogastroduodenoscopy Esophagogastroduodenoscopy (EGD) is a procedure to examine the lining of the esophagus, stomach, and first part of the small intestine (duodenum). This procedure is done to check for problems such as inflammation, bleeding, ulcers, or growths. During this procedure, a long, flexible, lighted tube with a camera attached (endoscope) is inserted down the throat. Tell a health care provider about:  Any allergies you have.  All medicines you are taking, including vitamins, herbs, eye drops, creams, and over-the-counter medicines.  Any problems you or family members have had with anesthetic  medicines.  Any blood disorders you have.  Any surgeries you have had.  Any medical conditions you have.  Whether you are pregnant or may be pregnant. What are the risks? Generally, this is a safe procedure. However, problems may occur, including:  Infection.  Bleeding.  A tear (perforation) in the esophagus, stomach, or duodenum.  Trouble breathing.  Excessive sweating.  Spasms of the larynx.  A slowed heartbeat.  Low blood pressure.  What happens before the procedure?  Follow instructions from your health care provider about eating or drinking restrictions.  Ask your health care provider about: ? Changing or stopping your regular medicines. This is especially important if you are taking diabetes medicines or blood thinners. ? Taking medicines such as aspirin and ibuprofen. These medicines can thin your blood. Do not take these medicines before your procedure if your health care provider instructs you not to.  Plan to have someone take you home after the procedure.  If you wear dentures, be ready to remove them before the procedure. What happens during the procedure?  To reduce your risk of infection, your health care team will wash or sanitize their hands.  An IV tube will be put in a vein in your hand or arm. You will get medicines and fluids through this tube.  You will be given one or more of the following: ? A medicine to help you relax (sedative). ? A medicine to numb the area (local  anesthetic). This medicine may be sprayed into your throat. It will make you feel more comfortable and keep you from gagging or coughing during the procedure. ? A medicine for pain.  A mouth guard may be placed in your mouth to protect your teeth and to keep you from biting on the endoscope.  You will be asked to lie on your left side.  The endoscope will be lowered down your throat into your esophagus, stomach, and duodenum.  Air will be put into the endoscope. This will  help your health care provider see better.  The lining of your esophagus, stomach, and duodenum will be examined.  Your health care provider may: ? Take a tissue sample so it can be looked at in a lab (biopsy). ? Remove growths. ? Remove objects (foreign bodies) that are stuck. ? Treat any bleeding with medicines or other devices that stop tissue from bleeding. ? Widen (dilate) or stretch narrowed areas of your esophagus and stomach.  The endoscope will be taken out. The procedure may vary among health care providers and hospitals. What happens after the procedure?  Your blood pressure, heart rate, breathing rate, and blood oxygen level will be monitored often until the medicines you were given have worn off.  Do not eat or drink anything until the numbing medicine has worn off and your gag reflex has returned. This information is not intended to replace advice given to you by your health care provider. Make sure you discuss any questions you have with your health care provider. Document Released: 02/06/2005 Document Revised: 03/13/2016 Document Reviewed: 08/30/2015 Elsevier Interactive Patient Education  2018 [employer]. Esophagogastroduodenoscopy, Care After Refer to this sheet in the next few weeks. These instructions provide you with information about caring for yourself after your procedure. Your health care provider may also give you more specific instructions. Your treatment has been planned according to current medical practices, but problems sometimes occur. Call your health care provider if you have any problems or questions after your procedure. What can I expect after the procedure? After the procedure, it is common to have:  A sore throat.  Nausea.  Bloating.  Dizziness.  Fatigue.  Follow these instructions at home:  Do not eat or drink anything until the numbing medicine (local anesthetic) has worn off and your gag reflex has returned. You will know that the  local anesthetic has worn off when you can swallow comfortably.  Do not drive for 24 hours if you received a medicine to help you relax (sedative).  If your health care provider took a tissue sample for testing during the procedure, make sure to get your test results. This is your responsibility. Ask your health care provider or the department performing the test when your results will be ready.  Keep all follow-up visits as told by your health care provider. This is important. Contact a health care provider if:  You cannot stop coughing.  You are not urinating.  You are urinating less than usual. Get help right away if:  You have trouble swallowing.  You cannot eat or drink.  You have throat or chest pain that gets worse.  You are dizzy or light-headed.  You faint.  You have nausea or vomiting.  You have chills.  You have a fever.  You have severe abdominal pain.  You have black, tarry, or bloody stools. This information is not intended to replace advice given to you by your health care provider. Make sure you discuss any  questions you have with your health care provider. Document Released: 09/22/2012 Document Revised: 03/13/2016 Document Reviewed: 08/30/2015 Elsevier Interactive Patient Education  2018 [employer].  Esophageal Dilatation Esophageal dilatation is a procedure to open a blocked or narrowed part of the esophagus. The esophagus is the long tube in your throat that carries food and liquid from your mouth to your stomach. The procedure is also called esophageal dilation. You may need this procedure if you have a buildup of scar tissue in your esophagus that makes it difficult, painful, or even impossible to swallow. This can be caused by gastroesophageal reflux disease (GERD). In rare cases, people need this procedure because they have cancer of the esophagus or a problem with the way food moves through the esophagus. Sometimes you may need to have another  dilatation to enlarge the opening of the esophagus gradually. Tell a health care provider about:  Any allergies you have.  All medicines you are taking, including vitamins, herbs, eye drops, creams, and over-the-counter medicines.  Any problems you or family members have had with anesthetic medicines.  Any blood disorders you have.  Any surgeries you have had.  Any medical conditions you have.  Any antibiotic medicines you are required to take before dental procedures. What are the risks? Generally, this is a safe procedure. However, problems can occur and include:  Bleeding from a tear in the lining of the esophagus.  A hole (perforation) in the esophagus.  What happens before the procedure?  Do not eat or drink anything after midnight on the night before the procedure or as directed by your health care provider.  Ask your health care provider about changing or stopping your regular medicines. This is especially important if you are taking diabetes medicines or blood thinners.  Plan to have someone take you home after the procedure. What happens during the procedure?  You will be given a medicine that makes you relaxed and sleepy (sedative).  A medicine may be sprayed or gargled to numb the back of the throat.  Your health care provider can use various instruments to do an esophageal dilatation. During the procedure, the instrument used will be placed in your mouth and passed down into your esophagus. Options include: ? Simple dilators. This instrument is carefully placed in the esophagus to stretch it. ? Guided wire bougies. In this method, a flexible tube (endoscope) is used to insert a wire into the esophagus. The dilator is passed over this wire to enlarge the esophagus. Then the wire is removed. ? Balloon dilators. An endoscope with a small balloon at the end is passed down into the esophagus. Inflating the balloon gently stretches the esophagus and opens it up. What  happens after the procedure?  Your blood pressure, heart rate, breathing rate, and blood oxygen level will be monitored often until the medicines you were given have worn off.  Your throat may feel slightly sore and will probably still feel numb. This will improve slowly over time.  You will not be allowed to eat or drink until the throat numbness has resolved.  If this is a same-day procedure, you may be allowed to go home once you have been able to drink, urinate, and sit on the edge of the bed without nausea or dizziness.  If this is a same-day procedure, you should have a friend or family member with you for the next 24 hours after the procedure. This information is not intended to replace advice given to you by your  health care provider. Make sure you discuss any questions you have with your health care provider. Document Released: 11/27/2005 Document Revised: 03/13/2016 Document Reviewed: 02/15/2014 Elsevier Interactive Patient Education  2017 [place].  Colonoscopy, Adult A colonoscopy is an exam to look at the entire large intestine. During the exam, a lubricated, bendable tube is inserted into the anus and then passed into the rectum, colon, and other parts of the large intestine. A colonoscopy is often done as a part of normal colorectal screening or in response to certain symptoms, such as anemia, persistent diarrhea, abdominal pain, and blood in the stool. The exam can help screen for and diagnose medical problems, including:  Tumors.  Polyps.  Inflammation.  Areas of bleeding.  Tell a health care provider about:  Any allergies you have.  All medicines you are taking, including vitamins, herbs, eye drops, creams, and over-the-counter medicines.  Any problems you or family members have had with anesthetic medicines.  Any blood disorders you have.  Any surgeries you have had.  Any medical conditions you have.  Any problems you have had passing stool. What are  the risks? Generally, this is a safe procedure. However, problems may occur, including:  Bleeding.  A tear in the intestine.  A reaction to medicines given during the exam.  Infection (rare).  What happens before the procedure? Eating and drinking restrictions Follow instructions from your health care provider about eating and drinking, which may include:  A few days before the procedure - follow a low-fiber diet. Avoid nuts, seeds, dried fruit, raw fruits, and vegetables.  1-3 days before the procedure - follow a clear liquid diet. Drink only clear liquids, such as clear broth or bouillon, black coffee or tea, clear juice, clear soft drinks or sports drinks, gelatin dessert, and popsicles. Avoid any liquids that contain red or purple dye.  On the day of the procedure - do not eat or drink anything during the 2 hours before the procedure, or within the time period that your health care provider recommends.  Bowel prep If you were prescribed an oral bowel prep to clean out your colon:  Take it as told by your health care provider. Starting the day before your procedure, you will need to drink a large amount of medicated liquid. The liquid will cause you to have multiple loose stools until your stool is almost clear or light green.  If your skin or anus gets irritated from diarrhea, you may use these to relieve the irritation: ? Medicated wipes, such as adult wet wipes with aloe and vitamin E. ? A skin soothing-product like petroleum jelly.  If you vomit while drinking the bowel prep, take a break for up to 60 minutes and then begin the bowel prep again. If vomiting continues and you cannot take the bowel prep without vomiting, call your health care provider.  General instructions  Ask your health care provider about changing or stopping your regular medicines. This is especially important if you are taking diabetes medicines or blood thinners.  Plan to have someone take you home  from the hospital or clinic. What happens during the procedure?  An IV tube may be inserted into one of your veins.  You will be given medicine to help you relax (sedative).  To reduce your risk of infection: ? Your health care team will wash or sanitize their hands. ? Your anal area will be washed with soap.  You will be asked to lie on your side with  your knees bent.  Your health care provider will lubricate a long, thin, flexible tube. The tube will have a camera and a light on the end.  The tube will be inserted into your anus.  The tube will be gently eased through your rectum and colon.  Air will be delivered into your colon to keep it open. You may feel some pressure or cramping.  The camera will be used to take images during the procedure.  A small tissue sample may be removed from your body to be examined under a microscope (biopsy). If any potential problems are found, the tissue will be sent to a lab for testing.  If small polyps are found, your health care provider may remove them and have them checked for cancer cells.  The tube that was inserted into your anus will be slowly removed. The procedure may vary among health care providers and hospitals. What happens after the procedure?  Your blood pressure, heart rate, breathing rate, and blood oxygen level will be monitored until the medicines you were given have worn off.  Do not drive for 24 hours after the exam.  You may have a small amount of blood in your stool.  You may pass gas and have mild abdominal cramping or bloating due to the air that was used to inflate your colon during the exam.  It is up to you to get the results of your procedure. Ask your health care provider, or the department performing the procedure, when your results will be ready. This information is not intended to replace advice given to you by your health care provider. Make sure you discuss any questions you have with your health care  provider. Document Released: 10/03/2000 Document Revised: 08/06/2016 Document Reviewed: 12/18/2015 Elsevier Interactive Patient Education  2018 [employer].  Colonoscopy, Adult, Care After This sheet gives you information about how to care for yourself after your procedure. Your health care provider may also give you more specific instructions. If you have problems or questions, contact your health care provider. What can I expect after the procedure? After the procedure, it is common to have:  A small amount of blood in your stool for 24 hours after the procedure.  Some gas.  Mild abdominal cramping or bloating.  Follow these instructions at home: General instructions   For the first 24 hours after the procedure: ? Do not drive or use machinery. ? Do not sign important documents. ? Do not drink alcohol. ? Do your regular daily activities at a slower pace than normal. ? Eat soft, easy-to-digest foods. ? Rest often.  Take over-the-counter or prescription medicines only as told by your health care provider.  It is up to you to get the results of your procedure. Ask your health care provider, or the department performing the procedure, when your results will be ready. Relieving cramping and bloating  Try walking around when you have cramps or feel bloated.  Apply heat to your abdomen as told by your health care provider. Use a heat source that your health care provider recommends, such as a moist heat pack or a heating pad. ? Place a towel between your skin and the heat source. ? Leave the heat on for 20-30 minutes. ? Remove the heat if your skin turns bright red. This is especially important if you are unable to feel pain, heat, or cold. You may have a greater risk of getting burned. Eating and drinking  Drink enough fluid to keep your  urine clear or pale yellow.  Resume your normal diet as instructed by your health care provider. Avoid heavy or fried foods that are hard to  digest.  Avoid drinking alcohol for as long as instructed by your health care provider. Contact a health care provider if:  You have blood in your stool 2-3 days after the procedure. Get help right away if:  You have more than a small spotting of blood in your stool.  You pass large blood clots in your stool.  Your abdomen is swollen.  You have nausea or vomiting.  You have a fever.  You have increasing abdominal pain that is not relieved with medicine. This information is not intended to replace advice given to you by your health care provider. Make sure you discuss any questions you have with your health care provider. Document Released: 05/20/2004 Document Revised: 06/30/2016 Document Reviewed: 12/18/2015 Elsevier Interactive Patient Education  2018 [employer] Anesthesia is a term that refers to techniques, procedures, and medicines that help a person stay safe and comfortable during a medical procedure. Monitored anesthesia care, or sedation, is one type of anesthesia. Your anesthesia specialist may recommend sedation if you will be having a procedure that does not require you to be unconscious, such as:  Cataract surgery.  A dental procedure.  A biopsy.  A colonoscopy.  During the procedure, you may receive a medicine to help you relax (sedative). There are three levels of sedation:  Mild sedation. At this level, you may feel awake and relaxed. You will be able to follow directions.  Moderate sedation. At this level, you will be sleepy. You may not remember the procedure.  Deep sedation. At this level, you will be asleep. You will not remember the procedure.  The more medicine you are given, the deeper your level of sedation will be. Depending on how you respond to the procedure, the anesthesia specialist may change your level of sedation or the type of anesthesia to fit your needs. An anesthesia specialist will monitor you closely during  the procedure. Let your health care provider know about:  Any allergies you have.  All medicines you are taking, including vitamins, herbs, eye drops, creams, and over-the-counter medicines.  Any use of steroids (by mouth or as a cream).  Any problems you or family members have had with sedatives and anesthetic medicines.  Any blood disorders you have.  Any surgeries you have had.  Any medical conditions you have, such as sleep apnea.  Whether you are pregnant or may be pregnant.  Any use of cigarettes, alcohol, or street drugs. What are the risks? Generally, this is a safe procedure. However, problems may occur, including:  Getting too much medicine (oversedation).  Nausea.  Allergic reaction to medicines.  Trouble breathing. If this happens, a breathing tube may be used to help with breathing. It will be removed when you are awake and breathing on your own.  Heart trouble.  Lung trouble.  Before the procedure Staying hydrated Follow instructions from your health care provider about hydration, which may include:  Up to 2 hours before the procedure - you may continue to drink clear liquids, such as water, clear fruit juice, black coffee, and plain tea.  Eating and drinking restrictions Follow instructions from your health care provider about eating and drinking, which may include:  8 hours before the procedure - stop eating heavy meals or foods such as meat, fried foods, or fatty foods.  6 hours  before the procedure - stop eating light meals or foods, such as toast or cereal.  6 hours before the procedure - stop drinking milk or drinks that contain milk.  2 hours before the procedure - stop drinking clear liquids.  Medicines Ask your health care provider about:  Changing or stopping your regular medicines. This is especially important if you are taking diabetes medicines or blood thinners.  Taking medicines such as aspirin and ibuprofen. These medicines can  thin your blood. Do not take these medicines before your procedure if your health care provider instructs you not to.  Tests and exams  You will have a physical exam.  You may have blood tests done to show: ? How well your kidneys and liver are working. ? How well your blood can clot.  General instructions  Plan to have someone take you home from the hospital or clinic.  If you will be going home right after the procedure, plan to have someone with you for 24 hours.  What happens during the procedure?  Your blood pressure, heart rate, breathing, level of pain and overall condition will be monitored.  An IV tube will be inserted into one of your veins.  Your anesthesia specialist will give you medicines as needed to keep you comfortable during the procedure. This may mean changing the level of sedation.  The procedure will be performed. After the procedure  Your blood pressure, heart rate, breathing rate, and blood oxygen level will be monitored until the medicines you were given have worn off.  Do not drive for 24 hours if you received a sedative.  You may: ? Feel sleepy, clumsy, or nauseous. ? Feel forgetful about what happened after the procedure. ? Have a sore throat if you had a breathing tube during the procedure. ? Vomit. This information is not intended to replace advice given to you by your health care provider. Make sure you discuss any questions you have with your health care provider. Document Released: 07/02/2005 Document Revised: 03/14/2016 Document Reviewed: 01/27/2016 Elsevier Interactive Patient Education  2018 [place], Care After These instructions provide you with information about caring for yourself after your procedure. Your health care provider may also give you more specific instructions. Your treatment has been planned according to current medical practices, but problems sometimes occur. Call your health care provider if  you have any problems or questions after your procedure. What can I expect after the procedure? After your procedure, it is common to:  Feel sleepy for several hours.  Feel clumsy and have poor balance for several hours.  Feel forgetful about what happened after the procedure.  Have poor judgment for several hours.  Feel nauseous or vomit.  Have a sore throat if you had a breathing tube during the procedure.  Follow these instructions at home: For at least 24 hours after the procedure:   Do not: ? Participate in activities in which you could fall or become injured. ? Drive. ? Use heavy machinery. ? Drink alcohol. ? Take sleeping pills or medicines that cause drowsiness. ? Make important decisions or sign legal documents. ? Take care of children on your own.  Rest. Eating and drinking  Follow the diet that is recommended by your health care provider.  If you vomit, drink water, juice, or soup when you can drink without vomiting.  Make sure you have little or no nausea before eating solid foods. General instructions  Have a responsible adult stay with you  until you are awake and alert.  Take over-the-counter and prescription medicines only as told by your health care provider.  If you smoke, do not smoke without supervision.  Keep all follow-up visits as told by your health care provider. This is important. Contact a health care provider if:  You keep feeling nauseous or you keep vomiting.  You feel light-headed.  You develop a rash.  You have a fever. Get help right away if:  You have trouble breathing. This information is not intended to replace advice given to you by your health care provider. Make sure you discuss any questions you have with your health care provider. Document Released: 01/27/2016 Document Revised: 05/28/2016 Document Reviewed: 01/27/2016 Elsevier Interactive Patient Education  [employer].

## 2017-08-19 ENCOUNTER — Telehealth: Payer: Self-pay | Admitting: Orthopedic Surgery

## 2017-08-19 ENCOUNTER — Other Ambulatory Visit: Payer: Self-pay | Admitting: Orthopedic Surgery

## 2017-08-19 MED ORDER — HYDROCODONE-ACETAMINOPHEN 5-325 MG PO TABS: 1.0000 | ORAL_TABLET | Freq: Four times a day (QID) | ORAL | 0 refills | Status: DC | PRN

## 2017-08-19 NOTE — Telephone Encounter (Signed)
Patient called for refill of: HYDROcodone-acetaminophen (NORCO) 5-325 MG tablet 28 tablet    (aware that referral appointment is pending with [facility]

## 2017-08-20 ENCOUNTER — Encounter (HOSPITAL_COMMUNITY): Payer: Self-pay

## 2017-08-20 ENCOUNTER — Encounter (HOSPITAL_COMMUNITY)
Admission: RE | Admit: 2017-08-20 | Discharge: 2017-08-20 | Disposition: A | Discharge status: Home / Self Care | Payer: BLUE CROSS/BLUE SHIELD | Source: Ambulatory Visit | Attending: Gastroenterology | Admitting: Gastroenterology

## 2017-08-20 DIAGNOSIS — Z01818 Encounter for other preprocedural examination: Secondary | ICD-10-CM | POA: Insufficient documentation

## 2017-08-20 DIAGNOSIS — R1013 Epigastric pain: Secondary | ICD-10-CM | POA: Insufficient documentation

## 2017-08-20 DIAGNOSIS — K219 Gastro-esophageal reflux disease without esophagitis: Secondary | ICD-10-CM | POA: Insufficient documentation

## 2017-08-20 DIAGNOSIS — K625 Hemorrhage of anus and rectum: Secondary | ICD-10-CM | POA: Insufficient documentation

## 2017-08-20 HISTORY — DX: Prediabetes: R73.03

## 2017-08-20 LAB — CBC WITH DIFFERENTIAL/PLATELET
BASOS PCT: 1 %
Basophils Absolute: 0 10*3/uL (ref 0.0–0.1)
EOS ABS: 0.3 10*3/uL (ref 0.0–0.7)
Eosinophils Relative: 6 %
HEMATOCRIT: 39.6 % (ref 36.0–46.0)
HEMOGLOBIN: 13 g/dL (ref 12.0–15.0)
LYMPHS ABS: 1.7 10*3/uL (ref 0.7–4.0)
Lymphocytes Relative: 34 %
MCH: 28.4 pg (ref 26.0–34.0)
MCHC: 32.8 g/dL (ref 30.0–36.0)
MCV: 86.5 fL (ref 78.0–100.0)
MONO ABS: 0.5 10*3/uL (ref 0.1–1.0)
MONOS PCT: 10 %
NEUTROS PCT: 49 %
Neutro Abs: 2.4 10*3/uL (ref 1.7–7.7)
PLATELETS: 188 10*3/uL (ref 150–400)
RBC: 4.58 MIL/uL (ref 3.87–5.11)
RDW: 14.3 % (ref 11.5–15.5)
WBC: 4.9 10*3/uL (ref 4.0–10.5)

## 2017-08-20 LAB — BASIC METABOLIC PANEL
ANION GAP: 10 (ref 5–15)
BUN: 10 mg/dL (ref 6–20)
CALCIUM: 9.3 mg/dL (ref 8.9–10.3)
CO2: 26 mmol/L (ref 22–32)
Chloride: 101 mmol/L (ref 101–111)
Creatinine, Ser: 0.91 mg/dL (ref 0.44–1.00)
GFR calc Af Amer: >60 mL/min (ref >60)
GLUCOSE: 94 mg/dL (ref 65–99)
Potassium: 3.9 mmol/L (ref 3.5–5.1)
Sodium: 137 mmol/L (ref 135–145)

## 2017-08-20 LAB — GLUCOSE, CAPILLARY: Glucose-Capillary: 94 mg/dL (ref 65–99)

## 2017-08-20 LAB — HEMOGLOBIN A1C
HEMOGLOBIN A1C: 5.9 % — AB (ref 4.8–5.6)
MEAN PLASMA GLUCOSE: 122.63 mg/dL

## 2017-08-21 NOTE — Pre-Procedure Instructions (Signed)
HgbA1C routed to PCP. 

## 2017-08-25 ENCOUNTER — Ambulatory Visit (HOSPITAL_COMMUNITY): Payer: BLUE CROSS/BLUE SHIELD | Admitting: Anesthesiology

## 2017-08-25 ENCOUNTER — Encounter (HOSPITAL_COMMUNITY)
Admission: RE | Disposition: A | Discharge status: Home / Self Care | Payer: Self-pay | Source: Ambulatory Visit | Attending: Gastroenterology

## 2017-08-25 ENCOUNTER — Encounter (HOSPITAL_COMMUNITY): Payer: Self-pay | Admitting: *Deleted

## 2017-08-25 ENCOUNTER — Ambulatory Visit (HOSPITAL_COMMUNITY)
Admission: RE | Admit: 2017-08-25 | Discharge: 2017-08-25 | Disposition: A | Discharge status: Home / Self Care | Payer: BLUE CROSS/BLUE SHIELD | Source: Ambulatory Visit | Attending: Gastroenterology | Admitting: Gastroenterology

## 2017-08-25 DIAGNOSIS — K449 Diaphragmatic hernia without obstruction or gangrene: Secondary | ICD-10-CM | POA: Diagnosis not present

## 2017-08-25 DIAGNOSIS — X58XXXA Exposure to other specified factors, initial encounter: Secondary | ICD-10-CM | POA: Insufficient documentation

## 2017-08-25 DIAGNOSIS — K3189 Other diseases of stomach and duodenum: Secondary | ICD-10-CM | POA: Diagnosis not present

## 2017-08-25 DIAGNOSIS — D128 Benign neoplasm of rectum: Secondary | ICD-10-CM

## 2017-08-25 DIAGNOSIS — Q438 Other specified congenital malformations of intestine: Secondary | ICD-10-CM | POA: Insufficient documentation

## 2017-08-25 DIAGNOSIS — K2960 Other gastritis without bleeding: Secondary | ICD-10-CM | POA: Diagnosis not present

## 2017-08-25 DIAGNOSIS — K625 Hemorrhage of anus and rectum: Secondary | ICD-10-CM | POA: Diagnosis not present

## 2017-08-25 DIAGNOSIS — K317 Polyp of stomach and duodenum: Secondary | ICD-10-CM | POA: Diagnosis not present

## 2017-08-25 DIAGNOSIS — K648 Other hemorrhoids: Secondary | ICD-10-CM | POA: Diagnosis not present

## 2017-08-25 DIAGNOSIS — K621 Rectal polyp: Secondary | ICD-10-CM | POA: Diagnosis not present

## 2017-08-25 DIAGNOSIS — K2270 Barrett's esophagus without dysplasia: Secondary | ICD-10-CM

## 2017-08-25 DIAGNOSIS — K2970 Gastritis, unspecified, without bleeding: Secondary | ICD-10-CM | POA: Insufficient documentation

## 2017-08-25 DIAGNOSIS — K222 Esophageal obstruction: Secondary | ICD-10-CM

## 2017-08-25 DIAGNOSIS — K921 Melena: Secondary | ICD-10-CM

## 2017-08-25 DIAGNOSIS — R1310 Dysphagia, unspecified: Secondary | ICD-10-CM | POA: Diagnosis not present

## 2017-08-25 DIAGNOSIS — T39395A Adverse effect of other nonsteroidal anti-inflammatory drugs [NSAID], initial encounter: Secondary | ICD-10-CM | POA: Diagnosis not present

## 2017-08-25 DIAGNOSIS — K219 Gastro-esophageal reflux disease without esophagitis: Secondary | ICD-10-CM | POA: Insufficient documentation

## 2017-08-25 DIAGNOSIS — I10 Essential (primary) hypertension: Secondary | ICD-10-CM | POA: Insufficient documentation

## 2017-08-25 DIAGNOSIS — G4730 Sleep apnea, unspecified: Secondary | ICD-10-CM | POA: Diagnosis not present

## 2017-08-25 DIAGNOSIS — K635 Polyp of colon: Secondary | ICD-10-CM | POA: Diagnosis not present

## 2017-08-25 HISTORY — PX: POLYPECTOMY: SHX5525

## 2017-08-25 HISTORY — PX: SAVORY DILATION: SHX5439

## 2017-08-25 HISTORY — PX: ESOPHAGOGASTRODUODENOSCOPY (EGD) WITH PROPOFOL: SHX5813

## 2017-08-25 HISTORY — PX: BIOPSY: SHX5522

## 2017-08-25 HISTORY — PX: COLONOSCOPY WITH PROPOFOL: SHX5780

## 2017-08-25 LAB — GLUCOSE, CAPILLARY
GLUCOSE-CAPILLARY: 99 mg/dL (ref 65–99)
GLUCOSE-CAPILLARY: 82 mg/dL (ref 65–99)

## 2017-08-25 SURGERY — COLONOSCOPY WITH PROPOFOL
Anesthesia: Monitor Anesthesia Care

## 2017-08-25 MED ORDER — PROPOFOL 500 MG/50ML IV EMUL
INTRAVENOUS | Status: DC | PRN
  Administered 2017-08-25 (×3): via INTRAVENOUS
  Administered 2017-08-25: 150 ug/kg/min via INTRAVENOUS

## 2017-08-25 MED ORDER — MIDAZOLAM HCL 2 MG/2ML IJ SOLN
INTRAMUSCULAR | Status: AC
  Filled 2017-08-25: qty 2

## 2017-08-25 MED ORDER — PROPOFOL 10 MG/ML IV BOLUS
INTRAVENOUS | Status: DC | PRN
  Administered 2017-08-25 (×4): 20 mg via INTRAVENOUS

## 2017-08-25 MED ORDER — LACTATED RINGERS IV SOLN
INTRAVENOUS | Status: DC
  Administered 2017-08-25: 11:00:00 via INTRAVENOUS

## 2017-08-25 MED ORDER — ONDANSETRON 4 MG PO TBDP
ORAL_TABLET | ORAL | Status: AC
  Filled 2017-08-25: qty 1

## 2017-08-25 MED ORDER — MIDAZOLAM HCL 2 MG/2ML IJ SOLN
1.0000 mg | Freq: Once | INTRAMUSCULAR | Status: AC | PRN
  Administered 2017-08-25: 2 mg via INTRAVENOUS

## 2017-08-25 MED ORDER — ONDANSETRON 4 MG PO TBDP
4.0000 mg | ORAL_TABLET | Freq: Once | ORAL | Status: AC
  Administered 2017-08-25: 4 mg via ORAL

## 2017-08-25 MED ORDER — LIDOCAINE VISCOUS 2 % MT SOLN
OROMUCOSAL | Status: AC
  Filled 2017-08-25: qty 15

## 2017-08-25 MED ORDER — LIDOCAINE VISCOUS 2 % MT SOLN
5.0000 mL | Freq: Once | OROMUCOSAL | Status: AC
  Administered 2017-08-25: 5 mL via OROMUCOSAL

## 2017-08-25 NOTE — Op Note (Signed)
[facility] Patient Name: Rhonda Patton Procedure Date: 08/25/2017 1:17 PM MRN: [id number] Date of Birth: 02-Apr-1961 Attending MD: Barney Drain MD, MD CSN: [id number] Age: 56 Admit Type: Outpatient Procedure:                Upper GI endoscopy WITH COLD FORCEPS                            BIOPSY/ESOPHAGEAL DILATION Indications:              Dysphagia-GERD UNCONTROLLED ON ZANTAC Providers:                Barney Drain MD, MD, Rosina Lowenstein, RN, Janeece Riggers,                            RN Referring MD:             Edwinna Areola. Hall MD Medicines:                Propofol per Anesthesia Complications:            No immediate complications. Estimated Blood Loss:     Estimated blood loss was minimal. Procedure:                Pre-Anesthesia Assessment:                           - Prior to the procedure, a History and Physical                            was performed, and patient medications and                            allergies were reviewed. The patient's tolerance of                            previous anesthesia was also reviewed. The risks                            and benefits of the procedure and the sedation                            options and risks were discussed with the patient.                            All questions were answered, and informed consent                            was obtained. Prior Anticoagulants: The patient has                            taken no previous anticoagulant or antiplatelet                            agents. ASA Grade Assessment: II - A patient with  mild systemic disease. After reviewing the risks                            and benefits, the patient was deemed in                            satisfactory condition to undergo the procedure.                            After obtaining informed consent, the endoscope was                            passed under direct vision. Throughout the                             procedure, the patient's blood pressure, pulse, and                            oxygen saturations were monitored continuously. The                            [id number]) scope was introduced through the                            mouth, and advanced to the second part of duodenum.                            The upper GI endoscopy was accomplished without                            difficulty. The patient tolerated the procedure                            well. Scope In: 1:23:14 PM Scope Out: 1:33:57 PM Total Procedure Duration: 0 hours 10 minutes 43 seconds  Findings:      One mild (non-circumferential scarring) benign-appearing, intrinsic       stenosis was found. And was traversed. A guidewire was placed and the       scope was withdrawn. Dilation was performed with a Savary dilator with       mild resistance at 16 mm and 17 mm.      A small hiatal hernia was present.      Patchy mild inflammation characterized by congestion (edema) and       erythema was found in the gastric antrum. Biopsies were taken with a       cold forceps for Helicobacter pylori testing.      The examined duodenum was normal. Impression:               - DYSPHAGIA DUE TO Benign-appearing esophageal                            stenosis/UNCONTROLLED REFLUX                           - Small hiatal hernia.                           -  MILD Gastritis DUE TO NSAIDS                           . Moderate Sedation:      Moderate (conscious) sedation was administered by the endoscopy nurse       and supervised by the endoscopist. The following parameters were       monitored: oxygen saturation, heart rate, blood pressure, and response       to care. Total physician intraservice time was 57 minutes. Recommendation:           - Await pathology results.                           - High fiber diet and low fat diet. LOSE WEIGHT.                            AVOID REFLUX TRIGGERS.                           - Continue  present medications. PROTONIX BID FOR 3                            MOS THEN ONCE DAILY                           - Return to my office in 4 months.                           - Patient has a contact number available for                            emergencies. The signs and symptoms of potential                            delayed complications were discussed with the                            patient. Return to normal activities tomorrow.                            Written discharge instructions were provided to the                            patient. Procedure Code(s):        --- Professional ---                           [phone], Esophagogastroduodenoscopy, flexible,                            transoral; with insertion of guide wire followed by                            passage of dilator(s) through esophagus over guide  wire                           [id number], Esophagogastroduodenoscopy, flexible,                            transoral; with biopsy, single or multiple                           99152, Moderate sedation services provided by the                            same physician or other qualified health care                            professional performing the diagnostic or                            therapeutic service that the sedation supports,                            requiring the presence of an independent trained                            observer to assist in the monitoring of the                            patient's level of consciousness and physiological                            status; initial 15 minutes of intraservice time,                            patient age 9 years or older                           [phone], Moderate sedation services; each additional                            15 minutes intraservice time                           99153, Moderate sedation services; each additional                            15 minutes intraservice  time                           99153, Moderate sedation services; each additional                            15 minutes intraservice time Diagnosis Code(s):        --- Professional ---                           K22.2, Esophageal obstruction  K44.9, Diaphragmatic hernia without obstruction or                            gangrene                           K29.70, Gastritis, unspecified, without bleeding                           R13.10, Dysphagia, unspecified CPT copyright 2016 American Medical Association. All rights reserved. The codes documented in this report are preliminary and upon coder review may  be revised to meet current compliance requirements. Barney Drain, MD Barney Drain MD, MD 08/25/2017 1:50:19 PM This report has been signed electronically. Number of Addenda: 0

## 2017-08-25 NOTE — Transfer of Care (Signed)
Immediate Anesthesia Transfer of Care Note  Patient: Rhonda Patton  Procedure(s) Performed: COLONOSCOPY WITH PROPOFOL (N/A ) ESOPHAGOGASTRODUODENOSCOPY (EGD) WITH PROPOFOL (N/A ) SAVORY DILATION (N/A ) POLYPECTOMY BIOPSY  Patient Location: PACU  Anesthesia Type:MAC  Level of Consciousness: awake and alert   Airway & Oxygen Therapy: Patient Spontanous Breathing  Post-op Assessment: Report given to RN  Post vital signs: Reviewed and stable  Last Vitals:  Vitals:   08/25/17 1155 08/25/17 1200  BP: 119/68 118/67  Pulse:    Resp: 16 19  Temp:    SpO2: 100% 100%    Last Pain:  Vitals:   08/25/17 1104  TempSrc: Oral  PainSc: 7       Patients Stated Pain Goal: 7 (54/00/86 7619)  Complications: No apparent anesthesia complications

## 2017-08-25 NOTE — Anesthesia Preprocedure Evaluation (Signed)
Anesthesia Evaluation    Airway Mallampati: I       Dental no notable dental hx. (+) Teeth Intact   Pulmonary shortness of breath, sleep apnea ,    Pulmonary exam normal        Cardiovascular METS: 5 - 7 Mets hypertension, Pt. on medications + angina (no current CP/SOB, has been worked up, thought to be related to reflux)  Rhythm:Regular Rate:Normal  - Left ventricular size was normal. - Overall left ventricular systolic function was normal. - Left ventricular ejection fraction was estimated , range being 55    % to 65 %.. - Left ventricular wall thickness was mildly increased.  (2006)   Neuro/Psych    GI/Hepatic GERD  Medicated and Controlled,  Endo/Other    Renal/GU      Musculoskeletal   Abdominal Normal abdominal exam  (+)  Abdomen: soft.    Peds  Hematology Results for Rhonda Patton, Rhonda Patton (MRN [id number]) as of 08/25/2017 10:58  08/20/2017 10:52 WBC: 4.9 RBC: 4.58 Hemoglobin: 13.0 HCT: 39.6 MCV: 86.5 MCH: 28.4 MCHC: 32.8 RDW: 14.3 Platelets: 188    Anesthesia Other Findings   Reproductive/Obstetrics                             Anesthesia Physical Anesthesia Plan  ASA: III  Anesthesia Plan: MAC   Post-op Pain Management:    Induction:   PONV Risk Score and Plan: 2  Airway Management Planned: Simple Face Mask  Additional Equipment:   Intra-op Plan:   Post-operative Plan:   Informed Consent: I have reviewed the patients History and Physical, chart, labs and discussed the procedure including the risks, benefits and alternatives for the proposed anesthesia with the patient or authorized representative who has indicated his/her understanding and acceptance.     Plan Discussed with: CRNA  Anesthesia Plan Comments:         Anesthesia Quick Evaluation

## 2017-08-25 NOTE — Anesthesia Postprocedure Evaluation (Signed)
Anesthesia Post Note  Patient: Rhonda Patton  Procedure(s) Performed: COLONOSCOPY WITH PROPOFOL (N/A ) ESOPHAGOGASTRODUODENOSCOPY (EGD) WITH PROPOFOL (N/A ) SAVORY DILATION (N/A ) POLYPECTOMY BIOPSY  Patient location during evaluation: PACU Anesthesia Type: MAC Level of consciousness: awake and alert and oriented Pain management: pain level controlled Vital Signs Assessment: post-procedure vital signs reviewed and stable Respiratory status: spontaneous breathing Cardiovascular status: blood pressure returned to baseline Postop Assessment: no apparent nausea or vomiting Anesthetic complications: no Comments: Late entry.     Last Vitals:  Vitals:   08/25/17 1400 08/25/17 1421  BP: 123/78 131/63  Pulse: (!) 59 75  Resp: (!) 21 17  Temp:  36.5 C  SpO2: 95% 94%    Last Pain:  Vitals:   08/25/17 1421  TempSrc: Oral  PainSc:                  [name]

## 2017-08-25 NOTE — Op Note (Signed)
[facility] Patient Name: Rhonda Patton Procedure Date: 08/25/2017 12:13 PM MRN: [id number] Date of Birth: 1960/10/25 Attending MD: Barney Drain MD, MD CSN: [id number] Age: 56 Admit Type: Outpatient Procedure:                Colonoscopy WITH COLD SNARE POLYPECTOMY Indications:              Hematochezia ON PRN NSAIDS Providers:                Barney Drain MD, MD, Rosina Lowenstein, RN, Janeece Riggers,                            RN Referring MD:             Edwinna Areola. Hall MD Medicines:                Propofol per Anesthesia Complications:            No immediate complications. Estimated Blood Loss:     Estimated blood loss was minimal. Procedure:                Pre-Anesthesia Assessment:                           - Prior to the procedure, a History and Physical                            was performed, and patient medications and                            allergies were reviewed. The patient's tolerance of                            previous anesthesia was also reviewed. The risks                            and benefits of the procedure and the sedation                            options and risks were discussed with the patient.                            All questions were answered, and informed consent                            was obtained. Prior Anticoagulants: The patient has                            taken no previous anticoagulant or antiplatelet                            agents. ASA Grade Assessment: II - A patient with                            mild systemic disease. After reviewing the risks  and benefits, the patient was deemed in                            satisfactory condition to undergo the procedure.                            After obtaining informed consent, the colonoscope                            was passed under direct vision. Throughout the                            procedure, the patient's blood pressure, pulse, and                oxygen saturations were monitored continuously. The                            [id number] ([id number]) scope was introduced through                            the anus and advanced to the 3 cm into the ileum.                            The colonoscopy was technically difficult and                            complex due to significant looping. Successful                            completion of the procedure was aided by                            straightening and shortening the scope to obtain                            bowel loop reduction and COLOWRAP. The patient                            tolerated the procedure well. The quality of the                            bowel preparation was good. The terminal ileum,                            ileocecal valve, appendiceal orifice, and rectum                            were photographed. Scope In: 12:48:17 PM Scope Out: 1:13:58 PM Scope Withdrawal Time: 0 hours 18 minutes 17 seconds  Total Procedure Duration: 0 hours 25 minutes 41 seconds  Findings:      The terminal ileum appeared normal.      A 5 mm polyp was found in the rectum. The polyp was sessile. The polyp       was removed with  a cold snare. Resection and retrieval were complete.      The recto-sigmoid colon, sigmoid colon and descending colon were       significantly redundant.      Internal hemorrhoids were found during retroflexion. The hemorrhoids       were small. Impression:               - The examined portion of the ileum was normal.                           - One 5 mm polyp in the rectum, removed with a cold                            snare. Resected and retrieved.                           - EXTREMELY Redundant LEFT colon.                           - RECTAL BLEEDING DUE TO INTERNAL HEMORRHOIDS Moderate Sedation:      Per Anesthesia Care Recommendation:           - High fiber diet and low fat diet.                           - Continue present medications.                            - Await pathology results.                           - Repeat colonoscopy in 5-10 years for surveillance.                           - Return to GI office in 4 months.                           - Patient has a contact number available for                            emergencies. The signs and symptoms of potential                            delayed complications were discussed with the                            patient. Return to normal activities tomorrow.                            Written discharge instructions were provided to the                            patient. Procedure Code(s):        --- Professional ---  45385, Colonoscopy, flexible; with removal of                            tumor(s), polyp(s), or other lesion(s) by snare                            technique Diagnosis Code(s):        --- Professional ---                           K62.1, Rectal polyp                           K92.1, Melena (includes Hematochezia)                           Q43.8, Other specified congenital malformations of                            intestine CPT copyright 2016 American Medical Association. All rights reserved. The codes documented in this report are preliminary and upon coder review may  be revised to meet current compliance requirements. Barney Drain, MD Barney Drain MD, MD 08/25/2017 1:45:25 PM This report has been signed electronically. Number of Addenda: 0

## 2017-08-25 NOTE — H&P (Signed)
Primary Care Physician:  Celene Squibb, MD Primary Gastroenterologist:  Dr. Oneida Alar  Pre-Procedure History & Physical: HPI:  Rhonda Patton is a 56 y.o. female here for DYSPHAGIA/screening  Past Medical History:  Diagnosis Date  . Angina    occasional  . Anxiety   . Arthritis   . Borderline diabetes   . Carpal tunnel syndrome of right wrist   . GERD (gastroesophageal reflux disease)   . Hypercholesteremia   . Hypertension   . Panic attack   . PONV (postoperative nausea and vomiting)   . Pre-diabetes   . Sleep apnea    diagnosed but doesn't use CPAP; never got CPAP.    Past Surgical History:  Procedure Laterality Date  . ABDOMINAL HYSTERECTOMY    . CESAREAN SECTION     x 3  . CHOLECYSTECTOMY    . FOOT SURGERY Right    x 2-bone spur  . HAND SURGERY Right    x 2-carpal tunnel  . KNEE ARTHROSCOPY Left   . PARTIAL HYSTERECTOMY  2001   total abdominal    Prior to Admission medications   Medication Sig Start Date End Date Taking? Authorizing Provider  acetaminophen (TYLENOL) 500 MG tablet Take 500 mg by mouth every 8 (eight) hours as needed for mild pain.   Yes [provider]  HYDROcodone-acetaminophen (NORCO) 5-325 MG tablet Take 1 tablet by mouth every 6 (six) hours as needed for moderate pain. 08/19/17  Yes Carole Civil, MD  pantoprazole (PROTONIX) 40 MG tablet Take 40 mg by mouth daily. 07/25/17  Yes [provider]  amLODipine (NORVASC) 5 MG tablet Take 1 tablet (5 mg total) by mouth daily. Patient not taking: Reported on 08/14/2017 09/18/14   Tysinger, Camelia Eng, PA-C  atorvastatin (LIPITOR) 40 MG tablet Take 1 tablet (40 mg total) by mouth daily. Patient not taking: Reported on 08/14/2017 10/25/14   Tysinger, Camelia Eng, PA-C  diclofenac (VOLTAREN) 75 MG EC tablet Take 1 tablet (75 mg total) by mouth 2 (two) times daily. Patient not taking: Reported on 08/14/2017 10/25/14   Tysinger, Camelia Eng, PA-C  furosemide (LASIX) 20 MG tablet Take 1 tablet  (20 mg total) by mouth every morning. Patient not taking: Reported on 08/14/2017 10/25/14   Tysinger, Camelia Eng, PA-C  gabapentin (NEURONTIN) 100 MG capsule Take 2 tablets every 8 hours Patient not taking: Reported on 08/14/2017 07/07/17   Carole Civil, MD  ibuprofen (ADVIL,MOTRIN) 800 MG tablet Take 1 tablet (800 mg total) by mouth 3 (three) times daily. Patient not taking: Reported on 08/14/2017 11/10/16   Carole Civil, MD  polyethylene glycol-electrolytes (TRILYTE) 420 g solution Take 4,000 mLs by mouth as directed. 07/28/17   Gennavieve Huq, Marga Melnick, MD  potassium chloride (K-DUR) 10 MEQ tablet Take 1 tablet (10 mEq total) by mouth daily. Patient not taking: Reported on 08/14/2017 10/25/14   Tysinger, Camelia Eng, PA-C  predniSONE (DELTASONE) 10 MG tablet Take 1 tablet (10 mg total) by mouth 2 (two) times daily with a meal. Patient taking differently: Take 10 mg by mouth as needed.  06/05/17   Carole Civil, MD  ranitidine (ZANTAC) 75 MG tablet Take 1 tablet (75 mg total) by mouth every evening. Patient not taking: Reported on 08/14/2017 09/18/14   Carlena Hurl, PA-C    Allergies as of 07/28/2017 - Review Complete 07/28/2017  Allergen Reaction Noted  . Codeine Shortness Of Breath and Nausea And Vomiting 08/24/2014  . Aspirin Other (See Comments)  Family History  Problem Relation Age of Onset  . Heart disease Unknown   . Arthritis Unknown   . Cancer Unknown   . Diabetes Unknown   . Colon cancer Mother 55  . Gastric cancer Neg Hx   . Esophageal cancer Neg Hx     Social History   Socioeconomic History  . Marital status: Married    Spouse name: Not on file  . Number of children: Not on file  . Years of education: 2  . Highest education level: Not on file  Social Needs  . Financial resource strain: Not on file  . Food insecurity - worry: Not on file  . Food insecurity - inability: Not on file  . Transportation needs - medical: Not on file  . Transportation needs -  non-medical: Not on file  Occupational History  . Not on file  Tobacco Use  . Smoking status: Never Smoker  . Smokeless tobacco: Never Used  Substance and Sexual Activity  . Alcohol use: No  . Drug use: No  . Sexual activity: Not Currently    Birth control/protection: Surgical  Other Topics Concern  . Not on file  Social History Narrative  . Not on file    Review of Systems: See HPI, otherwise negative ROS   Physical Exam: BP 118/67   Pulse 75   Temp 98.2 F (36.8 C) (Oral)   Resp 19   SpO2 100%  General:   Alert,  pleasant and cooperative in NAD Head:  Normocephalic and atraumatic. Neck:  Supple; Lungs:  Clear throughout to auscultation.    Heart:  Regular rate and rhythm. Abdomen:  Soft, nontender and nondistended. Normal bowel sounds, without guarding, and without rebound.   Neurologic:  Alert and  oriented x4;  grossly normal neurologically.  Impression/Plan:     DYSPHAGIA/screening  PLAN:  EGD/DIL/tcs TODAY DISCUSSED PROCEDURE, BENEFITS, & RISKS: < 1% chance of medication reaction, bleeding, perforation, or rupture of spleen/liver.

## 2017-08-25 NOTE — Discharge Instructions (Signed)
You had 1 polyp removed. YOU HAVE A SMALL HIATAL HERNIA. I STRETCHED YOUR ESOPHAGUS DUE TO YOUR PROBLEMS SWALLOWING. YOU HAAD A STRICTURE DUE TO UNCONTROLLED REFLUX. YOU HAVE MILD GASTRITIS. I BIOPSIED YOUR STOMACH.   DRINK WATER TO KEEP YOUR URINE LIGHT YELLOW.  CONTINUE YOUR WEIGHT LOSS EFFORTS. WHILE I DO NOT WANT TO ALARM YOU, YOUR BODY MASS INDEX IS OVER 40 WHICH MEANS YOU ARE MORBIDLY BESE. THIS CAN SHORTEN YOUR LIFE EXPECTANCY 10 YEARS. AS WELL [facility] CAN ACTIVATE CANCER GENES.  [facility] IS ASSOCIATED WITH AN INCREASED RISK FOR CIRRHOSIS AND ALL CANCERS, INCLUDING ESOPHAGEAL AND COLON CANCER. TRANSITION TO A PLANT BASED DIET-NO MEAT OR DAIRY for 6 MOS. AVOID ITEMS THAT CAUSE BLOATING & GAS. I RECOMMEND THE BOOK, "PREVENT AND REVERSE HEART DISEASE", CALDWELL ESSELSTYN JR., MD. PAGES 120-121 [place] THE DIET AND THE LAST HALF OF THE BOOK HAS QUICK AND EASY RECIPES FOR BREAKFAST, LUNCH, AND DINNER ARE AFTER P 127.   IN THE MEANTIME, FOLLOW A HIGH FIBER/LOW FAT DIET. AVOID ITEMS THAT CAUSE BLOATING. MEATS SHOULD BE BAKED, BROILED, OR BOILED. AVOID FRIED FOODS. SEE INFO BELOW.  AVOID REFLUX TRIGGERS. SEE INFO BELOW.   CONTINUE PROTONIX. TAKE 30 MINUTES PRIOR TO MEALS TWICE DAILY FOR 3 MOS THEN 30 MINUTES PRIOR TO YOUR FIRST MEAL ONCE DAILY.   YOUR BIOPSY RESULTS WILL BE AVAILABLE IN MY CHART NOV 13 AND MY OFFICE WILL CONTACT YOU IN 10-14 DAYS WITH YOUR RESULTS.    Follow up in 4 mos.  Next colonoscopy in 5-10 years.   ENDOSCOPY Care After Read the instructions outlined below and refer to this sheet in the next week. These discharge instructions provide you with general information on caring for yourself after you leave the hospital. While your treatment has been planned according to the most current medical practices available, unavoidable complications occasionally occur. If you have any problems or questions after discharge, call DR. Zhamir Pirro,  [phone].  ACTIVITY  You may resume your regular activity, but move at a slower pace for the next 24 hours.   Take frequent rest periods for the next 24 hours.   Walking will help get rid of the air and reduce the bloated feeling in your belly (abdomen).   No driving for 24 hours (because of the medicine (anesthesia) used during the test).   You may shower.   Do not sign any important legal documents or operate any machinery for 24 hours (because of the anesthesia used during the test).    NUTRITION  Drink plenty of fluids.   You may resume your normal diet as instructed by your doctor.   Begin with a light meal and progress to your normal diet. Heavy or fried foods are harder to digest and may make you feel sick to your stomach (nauseated).   Avoid alcoholic beverages for 24 hours or as instructed.    MEDICATIONS  You may resume your normal medications.   WHAT YOU CAN EXPECT TODAY  Some feelings of bloating in the abdomen.   Passage of more gas than usual.   Spotting of blood in your stool or on the toilet paper  .  IF YOU HAD POLYPS REMOVED DURING THE ENDOSCOPY:  Eat a soft diet IF YOU HAVE NAUSEA, BLOATING, ABDOMINAL PAIN, OR VOMITING.    FINDING OUT THE RESULTS OF YOUR TEST Not all test results are available during your visit. DR. Oneida Alar WILL CALL YOU WITHIN 14 DAYS OF YOUR PROCEDUE WITH YOUR RESULTS.  Do not assume everything is normal if you have not heard from DR. Amberli Ruegg, CALL HER OFFICE AT [phone].  SEEK IMMEDIATE MEDICAL ATTENTION AND CALL THE OFFICE: [phone] IF:  You have more than a spotting of blood in your stool.   Your belly is swollen (abdominal distention).   You are nauseated or vomiting.   You have a temperature over 101F.   You have abdominal pain or discomfort that is severe or gets worse throughout the day.      Lifestyle and home remedies TO MANAGE REFLUX/HEARTBURN  You may eliminate or reduce the frequency of  heartburn by making the following lifestyle changes:   Control your weight. Being overweight is a major risk factor for heartburn and GERD. Excess pounds put pressure on your abdomen, pushing up your stomach and causing acid to back up into your esophagus.    Eat smaller meals. 4 TO 6 MEALS A DAY. This reduces pressure on the lower esophageal sphincter, helping to prevent the valve from opening and acid from washing back into your esophagus.    Loosen your belt. Clothes that fit tightly around your waist put pressure on your abdomen and the lower esophageal sphincter.    Eliminate heartburn triggers. Everyone has specific triggers. Common triggers such as fatty or fried foods, spicy food, tomato sauce, carbonated beverages, alcohol, chocolate, mint, garlic, onion, caffeine and nicotine may make heartburn worse.    Avoid stooping or bending. Tying your shoes is OK. Bending over for longer periods to weed your garden isn't, especially soon after eating.    Don't lie down after a meal. Wait at least three to four hours after eating before going to bed, and don't lie down right after eating.    PUT THE HEAD OF YOUR BED ON 6 INCH BLOCKS.   Alternative medicine  Several home remedies exist for treating GERD, but they provide only temporary relief. They include drinking baking soda (sodium bicarbonate) added to water or drinking other fluids such as baking soda mixed with cream of tartar and water.   Although these liquids create temporary relief by neutralizing, washing away or buffering acids, eventually they aggravate the situation by adding gas and fluid to your stomach, increasing pressure and causing more acid reflux. Further, adding more sodium to your diet may increase your blood pressure and add stress to your heart, and excessive bicarbonate ingestion can alter the acid-base balance in your body.      High-Fiber Diet A high-fiber diet changes your normal diet to include more  whole grains, legumes, fruits, and vegetables. Changes in the diet involve replacing refined carbohydrates with unrefined foods. The calorie level of the diet is essentially unchanged. The Dietary Reference Intake (recommended amount) for adult males is 38 grams per day. For adult females, it is 25 grams per day. Pregnant and lactating women should consume 28 grams of fiber per day. Fiber is the intact part of a plant that is not broken down during digestion. Functional fiber is fiber that has been isolated from the plant to provide a beneficial effect in the body. PURPOSE  Increase stool bulk.   Ease and regulate bowel movements.   Lower cholesterol.  REDUCE RISK OF COLON CANCER  INDICATIONS THAT YOU NEED MORE FIBER  Constipation and hemorrhoids.   Uncomplicated diverticulosis (intestine condition) and irritable bowel syndrome.   Weight management.   As a protective measure against hardening of the arteries (atherosclerosis), diabetes, and cancer.   GUIDELINES FOR INCREASING FIBER IN THE  DIET  Start adding fiber to the diet slowly. A gradual increase of about 5 more grams (2 slices of whole-wheat bread, 2 servings of most fruits or vegetables, or 1 bowl of high-fiber cereal) per day is best. Too rapid an increase in fiber may result in constipation, flatulence, and bloating.   Drink enough water and fluids to keep your urine clear or pale yellow. Water, juice, or caffeine-free drinks are recommended. Not drinking enough fluid may cause constipation.   Eat a variety of high-fiber foods rather than one type of fiber.   Try to increase your intake of fiber through using high-fiber foods rather than fiber pills or supplements that contain small amounts of fiber.   The goal is to change the types of food eaten. Do not supplement your present diet with high-fiber foods, but replace foods in your present diet.   INCLUDE A VARIETY OF FIBER SOURCES  Replace refined and processed grains  with whole grains, canned fruits with fresh fruits, and incorporate other fiber sources. White rice, white breads, and most bakery goods contain little or no fiber.   Brown whole-grain rice, buckwheat oats, and many fruits and vegetables are all good sources of fiber. These include: broccoli, Brussels sprouts, cabbage, cauliflower, beets, sweet potatoes, white potatoes (skin on), carrots, tomatoes, eggplant, squash, berries, fresh fruits, and dried fruits.   Cereals appear to be the richest source of fiber. Cereal fiber is found in whole grains and bran. Bran is the fiber-rich outer coat of cereal grain, which is largely removed in refining. In whole-grain cereals, the bran remains. In breakfast cereals, the largest amount of fiber is found in those with "bran" in their names. The fiber content is sometimes indicated on the label.   You may need to include additional fruits and vegetables each day.   In baking, for 1 cup white flour, you may use the following substitutions:   1 cup whole-wheat flour minus 2 tablespoons.   1/2 cup white flour plus 1/2 cup whole-wheat flour.    Polyps, Colon  A polyp is extra tissue that grows inside your body. Colon polyps grow in the large intestine. The large intestine, also called the colon, is part of your digestive system. It is a long, hollow tube at the end of your digestive tract where your body makes and stores stool. Most polyps are not dangerous. They are benign. This means they are not cancerous. But over time, some types of polyps can turn into cancer. Polyps that are smaller than a pea are usually not harmful. But larger polyps could someday become or may already be cancerous. To be safe, doctors remove all polyps and test them.   PREVENTION There is not one sure way to prevent polyps. You might be able to lower your risk of getting them if you:  Eat more fruits and vegetables and less fatty food.   Do not smoke.   Avoid alcohol.   Exercise  every day.   Lose weight if you are overweight.   Eating more calcium and folate can also lower your risk of getting polyps. Some foods that are rich in calcium are milk, cheese, and broccoli. Some foods that are rich in folate are chickpeas, kidney beans, and spinach.    Hiatal Hernia A hiatal hernia occurs when a part of the stomach slides above the diaphragm. The diaphragm is the thin muscle separating the belly (abdomen) from the chest. A hiatal hernia can be something you are born with or  develop over time. Hiatal hernias may allow stomach acid to flow back into your esophagus, the tube which carries food from your mouth to your stomach. If this acid causes problems it is called GERD (gastro-esophageal reflux disease).   SYMPTOMS Common symptoms of GERD are heartburn (burning in your chest). This is worse when lying down or bending over. It may also cause belching and indigestion. Some of the things which make GERD worse are:  Increased weight pushes on stomach making acid rise more easily.   Smoking markedly increases acid production.   Alcohol decreases lower esophageal sphincter pressure (valve between stomach and esophagus), allowing acid from stomach into esophagus.   Late evening meals and going to bed with a full stomach increases pressure.   HOME CARE INSTRUCTIONS  Try to achieve and maintain an ideal body weight.   Avoid drinking alcoholic beverages.   DO NOT smokE.   Do not wear tight clothing around your chest or stomach.   Eat smaller meals and eat more frequently. This keeps your stomach from getting too full. Eat slowly.   Do not lie down for 2 or 3 hours after eating. Do not eat or drink anything 1 to 2 hours before going to bed.   Avoid caffeine beverages (colas, coffee, cocoa, tea), fatty foods, citrus fruits and all other foods and drinks that contain acid and that seem to increase the problems.   Avoid bending over, especially after eating OR STRAINING.  Anything that increases the pressure in your belly increases the amount of acid that may be pushed up into your esophagus.

## 2017-09-01 ENCOUNTER — Encounter (HOSPITAL_COMMUNITY): Payer: Self-pay | Admitting: Gastroenterology

## 2017-09-02 DIAGNOSIS — M4317 Spondylolisthesis, lumbosacral region: Secondary | ICD-10-CM | POA: Diagnosis not present

## 2017-09-02 DIAGNOSIS — M5416 Radiculopathy, lumbar region: Secondary | ICD-10-CM | POA: Diagnosis not present

## 2017-09-02 DIAGNOSIS — Z6841 Body Mass Index (BMI) 40.0 and over, adult: Secondary | ICD-10-CM | POA: Diagnosis not present

## 2017-09-02 DIAGNOSIS — M4727 Other spondylosis with radiculopathy, lumbosacral region: Secondary | ICD-10-CM | POA: Diagnosis not present

## 2017-09-09 ENCOUNTER — Telehealth: Payer: Self-pay | Admitting: Radiology

## 2017-09-09 ENCOUNTER — Encounter: Payer: Self-pay | Admitting: Orthopedic Surgery

## 2017-09-09 ENCOUNTER — Ambulatory Visit (INDEPENDENT_AMBULATORY_CARE_PROVIDER_SITE_OTHER): Payer: BLUE CROSS/BLUE SHIELD | Admitting: Orthopedic Surgery

## 2017-09-09 VITALS — BP 141/78 | HR 62 | Ht 63.0 in | Wt 250.0 lb

## 2017-09-09 DIAGNOSIS — M9983 Other biomechanical lesions of lumbar region: Secondary | ICD-10-CM | POA: Diagnosis not present

## 2017-09-09 DIAGNOSIS — M47816 Spondylosis without myelopathy or radiculopathy, lumbar region: Secondary | ICD-10-CM

## 2017-09-09 DIAGNOSIS — M48061 Spinal stenosis, lumbar region without neurogenic claudication: Secondary | ICD-10-CM

## 2017-09-09 DIAGNOSIS — Z9889 Other specified postprocedural states: Secondary | ICD-10-CM | POA: Diagnosis not present

## 2017-09-09 DIAGNOSIS — M1712 Unilateral primary osteoarthritis, left knee: Secondary | ICD-10-CM | POA: Diagnosis not present

## 2017-09-09 MED ORDER — HYDROCODONE-ACETAMINOPHEN 5-325 MG PO TABS: 1.0000 | ORAL_TABLET | Freq: Four times a day (QID) | ORAL | 0 refills | Status: DC | PRN

## 2017-09-09 NOTE — Addendum Note (Signed)
Addended by: [name] E on: 09/09/2017 02:03 PM   Modules accepted: Orders

## 2017-09-09 NOTE — Addendum Note (Signed)
Addended by: [name] E on: 09/09/2017 02:02 PM   Modules accepted: Orders, SmartSet

## 2017-09-09 NOTE — Telephone Encounter (Signed)
Faxed clinicals to [phone]  Ref # for precert is [id number] spoke to [facility]

## 2017-09-09 NOTE — Progress Notes (Signed)
Chief Complaint  Patient presents with  . Knee Pain    left  . Back Pain    lumbar/ has seen neurosurgeon    HPI This is a 56 year old female had left knee arthroscopy but had continued pain which was unrelieved by changes in her medication as well as the addition of anti-inflammatory medication.  She now has complaints of severe medial and anterior knee pain which she describes as a dull throbbing aching sensation about the knee associated with painful weightbearing and some stiffness.  She has had neurosurgical evaluation and that she was sent for physical therapy.  Past Medical History:  Diagnosis Date  . Angina    occasional  . Anxiety   . Arthritis   . Borderline diabetes   . Carpal tunnel syndrome of right wrist   . GERD (gastroesophageal reflux disease)   . Hypercholesteremia   . Hypertension   . Panic attack   . PONV (postoperative nausea and vomiting)   . Pre-diabetes   . Sleep apnea    diagnosed but doesn't use CPAP; never got CPAP.   Past Surgical History:  Procedure Laterality Date  . ABDOMINAL HYSTERECTOMY    . BIOPSY  08/25/2017   Procedure: BIOPSY;  Surgeon: Danie Binder, MD;  Location: [facility];  Service: Endoscopy;;  gastric biopsy, gastric polyp  . CESAREAN SECTION     x 3  . CHOLECYSTECTOMY    . COLONOSCOPY WITH PROPOFOL N/A 08/25/2017   Procedure: COLONOSCOPY WITH PROPOFOL;  Surgeon: Danie Binder, MD;  Location: [facility];  Service: Endoscopy;  Laterality: N/A;  12:15pm  . ESOPHAGOGASTRODUODENOSCOPY (EGD) WITH PROPOFOL N/A 08/25/2017   Procedure: ESOPHAGOGASTRODUODENOSCOPY (EGD) WITH PROPOFOL;  Surgeon: Danie Binder, MD;  Location: [facility];  Service: Endoscopy;  Laterality: N/A;  . FOOT SURGERY Right    x 2-bone spur  . HAND SURGERY Right    x 2-carpal tunnel  . KNEE ARTHROSCOPY Left   . KNEE ARTHROSCOPY WITH MEDIAL MENISECTOMY Left 05/07/2017   Procedure: KNEE ARTHROSCOPY WITH MEDIAL MENISECTOMY AND LATERAL MENISECTOMY, limited  debriedment;  Surgeon: Carole Civil, MD;  Location: [facility];  Service: Orthopedics;  Laterality: Left;  . PARTIAL HYSTERECTOMY  2001   total abdominal  . POLYPECTOMY  08/25/2017   Procedure: POLYPECTOMY;  Surgeon: Danie Binder, MD;  Location: [facility];  Service: Endoscopy;;  rectal polyp cs  . SAVORY DILATION N/A 08/25/2017   Procedure: SAVORY DILATION;  Surgeon: Danie Binder, MD;  Location: [facility];  Service: Endoscopy;  Laterality: N/A;  . TRIGGER FINGER RELEASE Right 11/27/2016   Procedure: RELEASE TRIGGER FINGER/A-1 PULLEY RIGHT LONG FINGER;  Surgeon: Carole Civil, MD;  Location: [facility];  Service: Orthopedics;  Laterality: Right;  pt knows to arrive at 8:45   Family History  Problem Relation Age of Onset  . Heart disease Unknown   . Arthritis Unknown   . Cancer Unknown   . Diabetes Unknown   . Colon cancer Mother 71  . Gastric cancer Neg Hx   . Esophageal cancer Neg Hx    Social History   Tobacco Use  . Smoking status: Never Smoker  . Smokeless tobacco: Never Used  Substance Use Topics  . Alcohol use: No  . Drug use: No    BP (!) 141/78   Pulse 62   Ht 5\' 3"  (1.6 m)   Wt 250 lb (113.4 kg)   BMI 44.29 kg/m   This is a well-developed  well-nourished female who has obesity.  She is oriented x3 her mood and affect are pleasant and flat effectively  She ambulates with a cane and a limp favoring her left knee  On inspection she has tenderness over the medial and anterior portions of no tenderness laterally.  She has some mild tenderness in the lateral thigh which we suspect is coming from the back the anterior compartment mainly L5 and L4 on the medial side showed no dermatomal tenderness and there is no complaint of pain there.  Knee flexion is 115 degrees with full extension on passive testing, ligaments remained stable muscle tone and strength are normal the skin is warm dry and intact with clean healed portal sites.  She has no peripheral edema  normal temperature of the left leg sensation remains normal deep tendon reflexes and pathologic reflexes remain intact and normal respectively.  Coordination and balance remain intact  Her MRI showed full-thickness cartilage loss of the medial compartment partial thickness patellofemoral and then she developed medial meniscus tear as well.  Although she had arthroscopic surgery she did not improve  She agrees at this point to get relief and improve her overall quality of life that she will need knee replacement surgery on the left  We will plan for left total knee arthroplasty  She understands that she may have some giving way symptoms and lateral leg pain which will probably be related to her lumbar disc disease  The procedure has been fully reviewed with the patient; The risks and benefits of surgery have been discussed and explained and understood. Alternative treatment has also been reviewed, questions were encouraged and answered. The postoperative plan is also been reviewed.

## 2017-09-09 NOTE — Patient Instructions (Addendum)
You have decided to proceed with knee replacement surgery. You have decided not to continue with nonoperative measures such as but not limited to oral medication, weight loss, activity modification, physical therapy, bracing, or injection.  We will perform the procedure commonly known as total knee replacement. Some of the risks associated with knee replacement surgery include but are not limited to Bleeding Infection Swelling Stiffness Blood clot Pain that persists even after surgery  Infection is especially devastating complication of knee surgery although rare. If infection does occur your implant will usually have to be removed and several surgeries and antibiotics will be needed to eradicate the infection prior to performing a repeat replacement.   In some cases amputation is required to eradicate the infection. In other rare cases a knee fusion is needed    If you're not comfortable with these risks and would like to continue with nonoperative treatment please let Dr. Aline Brochure know prior to your surgery.  Total Knee Replacement Total knee replacement is a procedure to replace the knee joint with an artificial (prosthetic) knee joint. The purpose of this surgery is to reduce knee pain and improve knee function. The prosthetic knee joint (prosthesis) is usually made of metal and plastic. It replaces parts of the thigh bone (femur), lower leg bone (tibia), and kneecap (patella) that are removed during the procedure. Tell a health care provider about:  Any allergies you have.  All medicines you are taking, including vitamins, herbs, eye drops, creams, and over-the-counter medicines.  Any problems you or family members have had with anesthetic medicines.  Any blood disorders you have.  Any surgeries you have had.  Any medical conditions you have.  Whether you are pregnant or may be pregnant. What are the risks? Generally, this is a safe procedure. However, problems may occur,  including:  Infection.  Bleeding.  Allergic reactions to medicines.  Damage to other structures or organs.  Decreased range of motion of the knee.  Instability of the knee.  Loosening of the prosthetic joint.  Knee pain that does not go away (chronic pain).  What happens before the procedure?  Ask your health care provider about: ? Changing or stopping your regular medicines. This is especially important if you are taking diabetes medicines or blood thinners. ? Taking medicines such as aspirin and ibuprofen. These medicines can thin your blood. Do not take these medicines before your procedure if your health care provider instructs you not to.  Have dental care and routine cleanings completed before your procedure. Plan to not have dental work done for 3 months after your procedure. Germs from anywhere in your body, including your mouth, can travel to your new joint and infect it.  Follow instructions from your health care provider about eating or drinking restrictions.  Ask your health care provider how your surgical site will be marked or identified.  You may be given antibiotic medicine to help prevent infection.  If your health care provider prescribes physical therapy, do exercises as instructed.  Do not use any tobacco products, such as cigarettes, chewing tobacco, or e-cigarettes. If you need help quitting, ask your health care provider.  You may have a physical exam.  You may have tests, such as: ? X-rays. ? MRI. ? CT scan. ? Bone scans.  You may have a blood or urine sample taken.  Plan to have someone take you home after the procedure.  If you will be going home right after the procedure, plan to have someone with  you for at least 24 hours. It is recommended that you have someone to help care for you for at least 4-6 weeks after your procedure. What happens during the procedure?  To reduce your risk of infection: ? Your health care team will wash or  sanitize their hands. ? Your skin will be washed with soap.  An IV tube will be inserted into one of your veins.  You will be given one or more of the following: ? A medicine to help you relax (sedative). ? A medicine to numb the area (local anesthetic). ? A medicine to make you fall asleep (general anesthetic). ? A medicine that is injected into your spine to numb the area below and slightly above the injection site (spinal anesthetic). ? A medicine that is injected into an area of your body to numb everything below the injection site (regional anesthetic).  An incision will be made in your knee.  Damaged cartilage and bone will be removed from your femur, tibia, and patella.  Parts of the prosthesis (liners)will be placed over the areas of bone and cartilage that were removed. A metal liner will be placed over your femur, and plastic liners will be placed over your tibia and the underside of your patella.  One or more small tubes (drains) may be placed near your incision to help drain extra fluid from your surgical site.  Your incision will be closed with stitches (sutures), skin glue, or adhesive strips. Medicine may be applied to your incision.  A bandage (dressing) will be placed over your incision. The procedure may vary among health care providers and hospitals. What happens after the procedure?  Your blood pressure, heart rate, breathing rate, and blood oxygen level will be monitored often until the medicines you were given have worn off.  You may continue to receive fluids and medicines through an IV tube.  You will have some pain. Pain medicines will be available to help you.  You may have fluid coming from one or more drains in your incision.  You may have to wear compression stockings. These stockings help to prevent blood clots and reduce swelling in your legs.  You will be encouraged to move around as much as possible.  You may be given a continuous passive motion  machine to use at home. You will be shown how to use this machine.  Do not drive for 24 hours if you received a sedative. This information is not intended to replace advice given to you by your health care provider. Make sure you discuss any questions you have with your health care provider. Document Released: 01/12/2001 Document Revised: 06/09/2016 Document Reviewed: 09/12/2015 Elsevier Interactive Patient Education  2017 [employer].

## 2017-09-09 NOTE — Telephone Encounter (Signed)
[facility] Pre op 12/4 @ 2:45, surgery 12/6 @ 10:15  Called [employer] To initiate a prior [place]

## 2017-09-14 ENCOUNTER — Ambulatory Visit (INDEPENDENT_AMBULATORY_CARE_PROVIDER_SITE_OTHER): Payer: BLUE CROSS/BLUE SHIELD

## 2017-09-14 ENCOUNTER — Other Ambulatory Visit: Payer: Self-pay | Admitting: Orthopedic Surgery

## 2017-09-14 DIAGNOSIS — M1712 Unilateral primary osteoarthritis, left knee: Secondary | ICD-10-CM

## 2017-09-14 NOTE — Telephone Encounter (Signed)
Surgery has been certified in total for Total knee replacement. CPT [phone] 09/24/17 [id number] is the reference number.

## 2017-09-17 ENCOUNTER — Other Ambulatory Visit: Payer: Self-pay | Admitting: Orthopedic Surgery

## 2017-09-17 DIAGNOSIS — M1712 Unilateral primary osteoarthritis, left knee: Secondary | ICD-10-CM

## 2017-09-18 DIAGNOSIS — R7301 Impaired fasting glucose: Secondary | ICD-10-CM | POA: Diagnosis not present

## 2017-09-21 NOTE — Patient Instructions (Signed)
Rhonda Patton  09/21/2017     @PREFPERIOPPHARMACY @   Your procedure is scheduled on  09/24/2017   Report to [facility] at  14  A.M.  Call this number if you have problems the morning of surgery:  [phone]   Remember:  Do not eat food or drink liquids after midnight.  Take these medicines the morning of surgery with A SIP OF WATER  Neurontin, microzide, norco, robaxin, protonix.   Do not wear jewelry, make-up or nail polish.  Do not wear lotions, powders, or perfumes, or deoderant.  Do not shave 48 hours prior to surgery.  Men may shave face and neck.  Do not bring valuables to the hospital.  [facility] is not responsible for any belongings or valuables.  Contacts, dentures or bridgework may not be worn into surgery.  Leave your suitcase in the car.  After surgery it may be brought to your room.  For patients admitted to the hospital, discharge time will be determined by your treatment team.  Patients discharged the day of surgery will not be allowed to drive home.   Name and phone number of your driver:   family Special instructions:  None  Please read over the following fact sheets that you were given. Pain Booklet, Coughing and Deep Breathing, Blood Transfusion Information, Total Joint Packet, MRSA Information, Surgical Site Infection Prevention, Anesthesia Post-op Instructions and Care and Recovery After Surgery       Total Knee Replacement Total knee replacement is a procedure to replace the knee joint with an artificial (prosthetic) knee joint. The purpose of this surgery is to reduce knee pain and improve knee function. The prosthetic knee joint (prosthesis) is usually made of metal and plastic. It replaces parts of the thigh bone (femur), lower leg bone (tibia), and kneecap (patella) that are removed during the procedure. Tell a health care provider about:  Any allergies you have.  All medicines you are taking, including  vitamins, herbs, eye drops, creams, and over-the-counter medicines.  Any problems you or family members have had with anesthetic medicines.  Any blood disorders you have.  Any surgeries you have had.  Any medical conditions you have.  Whether you are pregnant or may be pregnant. What are the risks? Generally, this is a safe procedure. However, problems may occur, including:  Infection.  Bleeding.  Allergic reactions to medicines.  Damage to other structures or organs.  Decreased range of motion of the knee.  Instability of the knee.  Loosening of the prosthetic joint.  Knee pain that does not go away (chronic pain).  What happens before the procedure?  Ask your health care provider about: ? Changing or stopping your regular medicines. This is especially important if you are taking diabetes medicines or blood thinners. ? Taking medicines such as aspirin and ibuprofen. These medicines can thin your blood. Do not take these medicines before your procedure if your health care provider instructs you not to.  Have dental care and routine cleanings completed before your procedure. Plan to not have dental work done for 3 months after your procedure. Germs from anywhere in your body, including your mouth, can travel to your new joint and infect it.  Follow instructions from your health care provider about eating or drinking restrictions.  Ask your health care provider how your surgical site will be marked or identified.  You may be given antibiotic medicine to help  prevent infection.  If your health care provider prescribes physical therapy, do exercises as instructed.  Do not use any tobacco products, such as cigarettes, chewing tobacco, or e-cigarettes. If you need help quitting, ask your health care provider.  You may have a physical exam.  You may have tests, such as: ? X-rays. ? MRI. ? CT scan. ? Bone scans.  You may have a blood or urine sample taken.  Plan to  have someone take you home after the procedure.  If you will be going home right after the procedure, plan to have someone with you for at least 24 hours. It is recommended that you have someone to help care for you for at least 4-6 weeks after your procedure. What happens during the procedure?  To reduce your risk of infection: ? Your health care team will wash or sanitize their hands. ? Your skin will be washed with soap.  An IV tube will be inserted into one of your veins.  You will be given one or more of the following: ? A medicine to help you relax (sedative). ? A medicine to numb the area (local anesthetic). ? A medicine to make you fall asleep (general anesthetic). ? A medicine that is injected into your spine to numb the area below and slightly above the injection site (spinal anesthetic). ? A medicine that is injected into an area of your body to numb everything below the injection site (regional anesthetic).  An incision will be made in your knee.  Damaged cartilage and bone will be removed from your femur, tibia, and patella.  Parts of the prosthesis (liners)will be placed over the areas of bone and cartilage that were removed. A metal liner will be placed over your femur, and plastic liners will be placed over your tibia and the underside of your patella.  One or more small tubes (drains) may be placed near your incision to help drain extra fluid from your surgical site.  Your incision will be closed with stitches (sutures), skin glue, or adhesive strips. Medicine may be applied to your incision.  A bandage (dressing) will be placed over your incision. The procedure may vary among health care providers and hospitals. What happens after the procedure?  Your blood pressure, heart rate, breathing rate, and blood oxygen level will be monitored often until the medicines you were given have worn off.  You may continue to receive fluids and medicines through an IV  tube.  You will have some pain. Pain medicines will be available to help you.  You may have fluid coming from one or more drains in your incision.  You may have to wear compression stockings. These stockings help to prevent blood clots and reduce swelling in your legs.  You will be encouraged to move around as much as possible.  You may be given a continuous passive motion machine to use at home. You will be shown how to use this machine.  Do not drive for 24 hours if you received a sedative. This information is not intended to replace advice given to you by your health care provider. Make sure you discuss any questions you have with your health care provider. Document Released: 01/12/2001 Document Revised: 06/09/2016 Document Reviewed: 09/12/2015 Elsevier Interactive Patient Education  2017 [place].  Total Knee Replacement, Care After Refer to this sheet in the next few weeks. These instructions provide you with information about caring for yourself after your procedure. Your health care provider may also give  you more specific instructions. Your treatment has been planned according to current medical practices, but problems sometimes occur. Call your health care provider if you have any problems or questions after your procedure. What can I expect after the procedure? After the procedure, it is common to have:  Pain and swelling.  A small amount of blood or clear fluid coming from your incision.  Limited range of motion.  Follow these instructions at home: Medicines  Take over-the-counter and prescription medicines only as told by your health care provider.  If you were prescribed an antibiotic medicine, take it as told by your health care provider. Do not stop taking the antibiotic even if you start to feel better.  If you were prescribed a blood thinner (anticoagulant), take it as told by your health care provider. If you have a splint or brace:  Wear the immobilizer  as told by your health care provider. Remove it only as told by your health care provider.  Loosen the immobilizer if your toes tingle, become numb, or turn cold and blue.  Do not let your immobilizer get wet if it is not waterproof.  Keep the immobilizer clean. Bathing   Do not take baths, swim, or use a hot tub until your health care provider approves. Ask your health care provider if you can take showers. You may only be allowed to take sponge baths for bathing.  If you have an immobilizer that is not waterproof, cover it with a watertight covering when you take a bath or shower.  Keep your bandage (dressing) dry until your health care provider says it can be removed. Incision care and drain care  Check your incision area and drain site every day for signs of infection. Check for: ? More redness, swelling, or pain. ? More fluid or blood. ? Warmth. ? Pus or a bad smell.  Follow instructions from your health care provider about how to take care of your incision. Make sure you: ? Wash your hands with soap and water before you change your dressing. If soap and water are not available, use hand sanitizer. ? Change your dressing as told by your health care provider. ? Leave stitches (sutures), skin glue, or adhesive strips in place. These skin closures may need to stay in place for 2 weeks or longer. If adhesive strip edges start to loosen and curl up, you may trim the loose edges. Do not remove adhesive strips completely unless your health care provider tells you to do that.  If you have a drain, follow instructions from your health care provider about caring for it. Do not remove the drain tube or any dressings around the tube opening unless your health care provider approves. Managing pain, stiffness, and swelling  If directed, put ice on your knee. ? Put ice in a plastic bag. ? Place a towel between your skin and the bag. ? Leave the ice on for 20 minutes, 2-3 times per day.  If  directed, apply heat to the affected area as often as told by your health care provider. Use the heat source that your health care provider recommends, such as a moist heat pack or a heating pad. ? Place a towel between your skin and the heat source. ? Leave the heat on for 20-30 minutes. ? Remove the heat if your skin turns bright red. This is especially important if you are unable to feel pain, heat, or cold. You may have a greater risk of getting burned.  Move your toes often to avoid stiffness and to lessen swelling.  Raise (elevate) your knee above the level of your heart while you are sitting or lying down.  Wear elastic knee support for as long as told by your health care provider. Driving   Do not drive until your health care provider approves. Ask your health care provider when it is safe to drive if you have an immobilizer on your knee.  Do not drive or operate heavy machinery while taking prescription pain medicine.  Do not drive for 24 hours if you received a sedative. Activity  Do not lift anything that is heavier than 10 lb (4.5 kg) until your health care provider approves.  Do not play contact sports until your health care provider approves.  Avoid high-impact activities, including running, jumping rope, and jumping jacks.  Avoid sitting for a long time without moving. Get up and move around at least every few hours.  If physical therapy was prescribed, do exercises as told by your health care provider.  Return to your normal activities as told by your health care provider. Ask your health care provider what activities are safe for you. Safety  Do not use your leg to support your body weight until your health care provider approves. Use crutches or a walker as told by your health care provider. General instructions   Do not have any dental work done for at least 3 months after your surgery. When you do have dental work done, tell your dentist about your joint  replacement.  Do not use any tobacco products, such as cigarettes, chewing tobacco, or e-cigarettes. If you need help quitting, ask your health care provider.  Wear compression stockings as told by your health care provider. These stockings help to prevent blood clots and reduce swelling in your legs.  If you have been sent home with a continuous passive motion machine, use it as told by your health care provider.  Drink enough fluid to keep your urine clear or pale yellow.  If you have been instructed to lose weight, follow instructions from your health care provider about how to do this safely.  Keep all follow-up visits as told by your health care provider. This is important. Contact a health care provider if:  You have more redness, swelling, or pain around your incision or drain.  You have more fluid or blood coming from your incision or drain.  Your incision or drain site feels warm to the touch.  You have pus or a bad smell coming from your incision or drain.  You have a fever.  Your incision breaks open after your health care provider removes your sutures, skin glue, or adhesive tape.  Your prosthesis feels loose.  You have knee pain that does not go away. Get help right away if:  You have a rash.  You have pain or swelling in your calf or thigh.  You have shortness of breath or difficulty breathing.  You have chest pain.  Your range of motion in your knee is getting worse. This information is not intended to replace advice given to you by your health care provider. Make sure you discuss any questions you have with your health care provider. Document Released: 04/25/2005 Document Revised: 06/09/2016 Document Reviewed: 09/12/2015 Elsevier Interactive Patient Education  2017 [place].  Spinal Anesthesia and Epidural Anesthesia Spinal anesthesia and epidural anesthesia are ways to numb a part of the body. They are often used:  During childbirth.  During  surgery on certain parts of the body. These include: ? Hip. ? Pelvis. ? Legs. ? Lower belly (abdomen).  After surgery on certain parts of the body. These include: ? Belly. ? Chest.  What happens before the procedure? Staying hydrated Follow instructions from your doctor about drinking (hydration). These may include:  Up to 2 hours before the procedure - you may continue to drink clear liquids. Some examples of clear liquids are: ? Water. ? Clear fruit juice. ? Black coffee. ? Plain tea.  Eating and drinking restrictions Follow instructions from your doctor about eating and drinking. These may include:  8 hours before the procedure - stop eating heavy meals or foods. Examples of these are meat, fried foods, or fatty foods.  6 hours before the procedure - stop eating light meals or foods. Examples of these are toast or cereal.  6 hours before the procedure - stop drinking milk or drinks that have milk in them.  2 hours before the procedure - stop drinking clear liquids.  Medicine Ask your doctor about:  Changing or stopping your regular medicines. This is especially important if you are taking diabetes medicines or blood thinners.  Changing or stopping your dietary supplements.  Taking medicines such as aspirin and ibuprofen. These medicines can thin your blood. Do not take these medicines before your procedure if your doctor tells you not to.  General instructions   Do not use any tobacco products for as long as possible. ? Examples of these are cigarettes, chewing tobacco, and e-cigarettes. ? If you need help quitting, ask your doctor.  Ask your doctor if you will have to stay overnight at the hospital or clinic.  If you will not have to stay overnight: ? Plan to have someone take you home. ? Plan to have someone with you for 24 hours. What happens during the procedure?  A doctor will put patches on your chest, a cuff around your arm, or a sensor device on your  finger. They will be connected to monitors.  An IV tube may be put into one of your veins. This tube is used to give fluids and medicines.  You may be given a medicine to help you relax (sedative).  You may be asked to: ? Sit up. ? Lie on your side. ? Bend your knees and chin toward your chest.  An area of your back will be cleaned.  You may get a shot of medicine in your back. The medicine will help prevent pain from the shot of numbing medicine.  A needle will be put between the bones of your back. While this is being done: ? Breathe normally. ? Try not to move. ? Stay as quiet as you can. ? Tell your doctor if you feel a tingling shock or pain going down your leg.  You will get the shot of numbing medicine.  If you need more medicine, a tube (catheter) may be put in the place where you got the shot. The tube will be used to give you more medicine. It will be left in if you need pain medicine after the procedure.  A bandage (dressing) may be put on your back. The procedure may vary among doctors and hospitals. What happens after the procedure?  Stay in bed until your doctor says it is safe to walk.  Your doctors will check on you often until the medicines wear off.  If there is a tube in your back, it will be taken out when  it is no longer needed.  Do not drive for 24 hours if you were given a medicine to help you relax (sedative).  It is common to feel sick to your stomach (nauseous) and itchy. There are medicines that can help. It is also common to: ? Be sleepy. ? Throw up (vomit). ? Have numbness or tingling in your legs. ? Have trouble peeing (urinating). This information is not intended to replace advice given to you by your health care provider. Make sure you discuss any questions you have with your health care provider. Document Released: 01/28/2016 Document Revised: 03/18/2016 Document Reviewed: 01/28/2016 Elsevier Interactive Patient Education  2018 [employer].  Spinal Anesthesia and Epidural Anesthesia, Care After These instructions give you information about caring for yourself after your procedure. Your doctor may also give you more specific instructions. Call your doctor if you have any problems or questions after your procedure. Follow these instructions at home: For at least 24 hours after the procedure:   Do not: ? Do activities where you could fall or get hurt (injured). ? Drive. ? Use heavy machinery. ? Drink alcohol. ? Take sleeping pills or medicines that make you sleepy (drowsy). ? Make important decisions. ? Sign legal documents. ? Take care of children on your own.  Rest. Eating and drinking  If you throw up (vomit), drink water, juice, or soup when you can drink without throwing up.  Make sure you do not feel like throwing up (are not nauseous) before you eat.  Follow the diet that is recommended by your doctor. General instructions  Have a responsible adult stay with you until you are awake and alert.  Take over-the-counter and prescription medicines only as told by your doctor.  If you smoke, do not smoke unless an adult is watching.  Keep all follow-up visits as told by your doctor. This is important. Contact a doctor if:  It has been more than one day since your procedure and you feel like throwing up.  It has been more than one day since your procedure and you throw up.  You have a rash. Get help right away if:  You have a fever.  You have a headache that lasts a long time.  You have a very bad headache.  Your vision is blurry.  You see two of a single object (double vision).  You are dizzy or light-headed.  You faint.  Your arms or legs tingle, feel weak, or get numb.  You have trouble breathing.  You cannot pee (urinate). This information is not intended to replace advice given to you by your health care provider. Make sure you discuss any questions you have with your health care  provider. Document Released: 01/28/2016 Document Revised: 05/29/2016 Document Reviewed: 01/28/2016 Elsevier Interactive Patient Education  2018 [place] Anesthesia, Adult General anesthesia is the use of medicines to make a person "go to sleep" (be unconscious) for a medical procedure. General anesthesia is often recommended when a procedure:  Is long.  Requires you to be still or in an unusual position.  Is major and can cause you to lose blood.  Is impossible to do without general anesthesia.  The medicines used for general anesthesia are called general anesthetics. In addition to making you sleep, the medicines:  Prevent pain.  Control your blood pressure.  Relax your muscles.  Tell a health care provider about:  Any allergies you have.  All medicines you are taking, including vitamins, herbs, eye drops,  creams, and over-the-counter medicines.  Any problems you or family members have had with anesthetic medicines.  Types of anesthetics you have had in the past.  Any bleeding disorders you have.  Any surgeries you have had.  Any medical conditions you have.  Any history of heart or lung conditions, such as heart failure, sleep apnea, or chronic obstructive pulmonary disease (COPD).  Whether you are pregnant or may be pregnant.  Whether you use tobacco, alcohol, marijuana, or street drugs.  Any history of Armed forces logistics/support/administrative officer.  Any history of depression or anxiety. What are the risks? Generally, this is a safe procedure. However, problems may occur, including:  Allergic reaction to anesthetics.  Lung and heart problems.  Inhaling food or liquids from your stomach into your lungs (aspiration).  Injury to nerves.  Waking up during your procedure and being unable to move (rare).  Extreme agitation or a state of mental confusion (delirium) when you wake up from the anesthetic.  Air in the bloodstream, which can lead to stroke.  These problems  are more likely to develop if you are having a major surgery or if you have an advanced medical condition. You can prevent some of these complications by answering all of your health care provider's questions thoroughly and by following all pre-procedure instructions. General anesthesia can cause side effects, including:  Nausea or vomiting  A sore throat from the breathing tube.  Feeling cold or shivery.  Feeling tired, washed out, or achy.  Sleepiness or drowsiness.  Confusion or agitation.  What happens before the procedure? Staying hydrated Follow instructions from your health care provider about hydration, which may include:  Up to 2 hours before the procedure - you may continue to drink clear liquids, such as water, clear fruit juice, black coffee, and plain tea.  Eating and drinking restrictions Follow instructions from your health care provider about eating and drinking, which may include:  8 hours before the procedure - stop eating heavy meals or foods such as meat, fried foods, or fatty foods.  6 hours before the procedure - stop eating light meals or foods, such as toast or cereal.  6 hours before the procedure - stop drinking milk or drinks that contain milk.  2 hours before the procedure - stop drinking clear liquids.  Medicines  Ask your health care provider about: ? Changing or stopping your regular medicines. This is especially important if you are taking diabetes medicines or blood thinners. ? Taking medicines such as aspirin and ibuprofen. These medicines can thin your blood. Do not take these medicines before your procedure if your health care provider instructs you not to. ? Taking new dietary supplements or medicines. Do not take these during the week before your procedure unless your health care provider approves them.  If you are told to take a medicine or to continue taking a medicine on the day of the procedure, take the medicine with sips of  water. General instructions   Ask if you will be going home the same day, the following day, or after a longer hospital stay. ? Plan to have someone take you home. ? Plan to have someone stay with you for the first 24 hours after you leave the hospital or clinic.  For 3-6 weeks before the procedure, try not to use any tobacco products, such as cigarettes, chewing tobacco, and e-cigarettes.  You may brush your teeth on the morning of the procedure, but make sure to spit out the toothpaste. What happens  during the procedure?  You will be given anesthetics through a mask and through an IV tube in one of your veins.  You may receive medicine to help you relax (sedative).  As soon as you are asleep, a breathing tube may be used to help you breathe.  An anesthesia specialist will stay with you throughout the procedure. He or she will help keep you comfortable and safe by continuing to give you medicines and adjusting the amount of medicine that you get. He or she will also watch your blood pressure, pulse, and oxygen levels to make sure that the anesthetics do not cause any problems.  If a breathing tube was used to help you breathe, it will be removed before you wake up. The procedure may vary among health care providers and hospitals. What happens after the procedure?  You will wake up, often slowly, after the procedure is complete, usually in a recovery area.  Your blood pressure, heart rate, breathing rate, and blood oxygen level will be monitored until the medicines you were given have worn off.  You may be given medicine to help you calm down if you feel anxious or agitated.  If you will be going home the same day, your health care provider may check to make sure you can stand, drink, and urinate.  Your health care providers will treat your pain and side effects before you go home.  Do not drive for 24 hours if you received a sedative.  You may: ? Feel nauseous and  vomit. ? Have a sore throat. ? Have mental slowness. ? Feel cold or shivery. ? Feel sleepy. ? Feel tired. ? Feel sore or achy, even in parts of your body where you did not have surgery. This information is not intended to replace advice given to you by your health care provider. Make sure you discuss any questions you have with your health care provider. Document Released: 01/13/2008 Document Revised: 03/18/2016 Document Reviewed: 09/20/2015 Elsevier Interactive Patient Education  2018 [employer] [facility], Adult, Care After These instructions provide you with information about caring for yourself after your procedure. Your health care provider may also give you more specific instructions. Your treatment has been planned according to current medical practices, but problems sometimes occur. Call your health care provider if you have any problems or questions after your procedure. What can I expect after the procedure? After the procedure, it is common to have:  Vomiting.  A sore throat.  Mental slowness.  It is common to feel:  Nauseous.  Cold or shivery.  Sleepy.  Tired.  Sore or achy, even in parts of your body where you did not have surgery.  Follow these instructions at home: For at least 24 hours after the procedure:  Do not: ? Participate in activities where you could fall or become injured. ? Drive. ? Use heavy machinery. ? Drink alcohol. ? Take sleeping pills or medicines that cause drowsiness. ? Make important decisions or sign legal documents. ? Take care of children on your own.  Rest. Eating and drinking  If you vomit, drink water, juice, or soup when you can drink without vomiting.  Drink enough fluid to keep your urine clear or pale yellow.  Make sure you have little or no nausea before eating solid foods.  Follow the diet recommended by your health care provider. General instructions  Have a responsible adult stay with you until you  are awake and alert.  Return to your normal activities  as told by your health care provider. Ask your health care provider what activities are safe for you.  Take over-the-counter and prescription medicines only as told by your health care provider.  If you smoke, do not smoke without supervision.  Keep all follow-up visits as told by your health care provider. This is important. Contact a health care provider if:  You continue to have nausea or vomiting at home, and medicines are not helpful.  You cannot drink fluids or start eating again.  You cannot urinate after 8-12 hours.  You develop a skin rash.  You have fever.  You have increasing redness at the site of your procedure. Get help right away if:  You have difficulty breathing.  You have chest pain.  You have unexpected bleeding.  You feel that you are having a life-threatening or urgent problem. This information is not intended to replace advice given to you by your health care provider. Make sure you discuss any questions you have with your health care provider. Document Released: 01/12/2001 Document Revised: 03/10/2016 Document Reviewed: 09/20/2015 Elsevier Interactive Patient Education  [employer].

## 2017-09-22 ENCOUNTER — Encounter (HOSPITAL_COMMUNITY)
Admission: RE | Admit: 2017-09-22 | Discharge: 2017-09-22 | Disposition: A | Discharge status: Home / Self Care | Payer: BLUE CROSS/BLUE SHIELD | Source: Ambulatory Visit | Attending: Orthopedic Surgery | Admitting: Orthopedic Surgery

## 2017-09-22 ENCOUNTER — Encounter (HOSPITAL_COMMUNITY): Payer: Self-pay

## 2017-09-22 ENCOUNTER — Other Ambulatory Visit: Payer: Self-pay

## 2017-09-22 DIAGNOSIS — K219 Gastro-esophageal reflux disease without esophagitis: Secondary | ICD-10-CM | POA: Diagnosis not present

## 2017-09-22 DIAGNOSIS — Z885 Allergy status to narcotic agent status: Secondary | ICD-10-CM | POA: Diagnosis not present

## 2017-09-22 DIAGNOSIS — Z79899 Other long term (current) drug therapy: Secondary | ICD-10-CM | POA: Diagnosis not present

## 2017-09-22 DIAGNOSIS — Z800 Family history of malignant neoplasm of digestive organs: Secondary | ICD-10-CM | POA: Diagnosis not present

## 2017-09-22 DIAGNOSIS — I080 Rheumatic disorders of both mitral and aortic valves: Secondary | ICD-10-CM | POA: Diagnosis not present

## 2017-09-22 DIAGNOSIS — I10 Essential (primary) hypertension: Secondary | ICD-10-CM | POA: Diagnosis not present

## 2017-09-22 DIAGNOSIS — Z90710 Acquired absence of both cervix and uterus: Secondary | ICD-10-CM | POA: Diagnosis not present

## 2017-09-22 DIAGNOSIS — G4730 Sleep apnea, unspecified: Secondary | ICD-10-CM | POA: Diagnosis not present

## 2017-09-22 DIAGNOSIS — Z9049 Acquired absence of other specified parts of digestive tract: Secondary | ICD-10-CM | POA: Diagnosis not present

## 2017-09-22 DIAGNOSIS — Z471 Aftercare following joint replacement surgery: Secondary | ICD-10-CM | POA: Diagnosis not present

## 2017-09-22 DIAGNOSIS — M1712 Unilateral primary osteoarthritis, left knee: Secondary | ICD-10-CM | POA: Diagnosis not present

## 2017-09-22 DIAGNOSIS — Z96652 Presence of left artificial knee joint: Secondary | ICD-10-CM | POA: Diagnosis not present

## 2017-09-22 DIAGNOSIS — Z886 Allergy status to analgesic agent status: Secondary | ICD-10-CM | POA: Diagnosis not present

## 2017-09-22 LAB — PROTIME-INR
INR: 1.08
PROTHROMBIN TIME: 13.9 s (ref 11.4–15.2)

## 2017-09-22 LAB — COMPREHENSIVE METABOLIC PANEL
ALT: 28 U/L (ref 14–54)
ANION GAP: 9 (ref 5–15)
AST: 30 U/L (ref 15–41)
Albumin: 4.4 g/dL (ref 3.5–5.0)
Alkaline Phosphatase: 65 U/L (ref 38–126)
BUN: 15 mg/dL (ref 6–20)
CHLORIDE: 105 mmol/L (ref 101–111)
CO2: 26 mmol/L (ref 22–32)
CREATININE: 1.07 mg/dL — AB (ref 0.44–1.00)
Calcium: 9.9 mg/dL (ref 8.9–10.3)
GFR, EST NON AFRICAN AMERICAN: 56 mL/min — AB (ref >60)
Glucose, Bld: 92 mg/dL (ref 65–99)
POTASSIUM: 4.2 mmol/L (ref 3.5–5.1)
SODIUM: 140 mmol/L (ref 135–145)
Total Bilirubin: 0.6 mg/dL (ref 0.3–1.2)
Total Protein: 7.5 g/dL (ref 6.5–8.1)

## 2017-09-22 LAB — ABO/RH: ABO/RH(D): A POS

## 2017-09-22 LAB — CBC WITH DIFFERENTIAL/PLATELET
Basophils Absolute: 0 10*3/uL (ref 0.0–0.1)
Basophils Relative: 0 %
EOS ABS: 0.3 10*3/uL (ref 0.0–0.7)
EOS PCT: 6 %
HCT: 42.1 % (ref 36.0–46.0)
Hemoglobin: 13.8 g/dL (ref 12.0–15.0)
LYMPHS ABS: 2 10*3/uL (ref 0.7–4.0)
LYMPHS PCT: 41 %
MCH: 28.7 pg (ref 26.0–34.0)
MCHC: 32.8 g/dL (ref 30.0–36.0)
MCV: 87.5 fL (ref 78.0–100.0)
MONO ABS: 0.3 10*3/uL (ref 0.1–1.0)
Monocytes Relative: 7 %
Neutro Abs: 2.3 10*3/uL (ref 1.7–7.7)
Neutrophils Relative %: 46 %
PLATELETS: 191 10*3/uL (ref 150–400)
RBC: 4.81 MIL/uL (ref 3.87–5.11)
RDW: 13.7 % (ref 11.5–15.5)
WBC: 4.9 10*3/uL (ref 4.0–10.5)

## 2017-09-22 LAB — SURGICAL PCR SCREEN
MRSA, PCR: NEGATIVE
STAPHYLOCOCCUS AUREUS: NEGATIVE

## 2017-09-23 NOTE — H&P (Signed)
TOTAL KNEE ADMISSION H&P  Patient is being admitted for left total knee arthroplasty.  Subjective:  Chief Complaint:left knee pain.  HPI: Rhonda Patton, 56 y.o. female, has a history of pain and functional disability in the left knee due to arthritis and has failed non-surgical conservative treatments for greater than 12 weeks to includeNSAID's and/or analgesics, corticosteriod injections, viscosupplementation injections, supervised PT with diminished ADL's post treatment, use of assistive devices and activity modification.  Onset of symptoms was gradual, starting 5 years ago with gradually worsening course since that time. The patient noted prior procedures on the knee to include  arthroscopy and menisectomy on the left knee(s).  Patient currently rates pain in the left knee(s) at 8 out of 10 with activity. Patient has night pain, worsening of pain with activity and weight bearing, pain that interferes with activities of daily living, pain with passive range of motion, crepitus and joint swelling.  Patient has evidence of subchondral sclerosis, periarticular osteophytes and joint space narrowing by imaging studies. There is no active infection.  Patient Active Problem List   Diagnosis Date Noted  . Rectal polyp   . Gastritis due to nonsteroidal anti-inflammatory drug   . Stricture and stenosis of esophagus   . GERD (gastroesophageal reflux disease) 07/28/2017  . Rectal bleeding 07/28/2017  . Dysphagia 07/28/2017  . Abdominal pain, epigastric 07/28/2017  . Derangement of unspecified lateral meniscus due to old tear or injury, left knee   . Trigger finger, acquired   . OA (osteoarthritis) of knee 10/07/2012  . Left leg weakness 03/17/2012  . Difficulty in walking(719.7) 03/17/2012  . Knee stiffness, left 03/17/2012  . S/P arthroscopy of left knee 05/07/17 03/10/2012  . Complex tear of medial meniscus of left knee 03/03/2012  . CARPAL TUNNEL SYNDROME 05/01/2010  . MITRAL REGURGITATION  07/20/2009  . AORTIC INSUFFICIENCY 07/20/2009  . SHORTNESS OF BREATH 06/20/2009  . CHEST PAIN-UNSPECIFIED 05/31/2009   Past Medical History:  Diagnosis Date  . Angina    occasional  . Anxiety   . Arthritis   . Borderline diabetes   . Carpal tunnel syndrome of right wrist   . GERD (gastroesophageal reflux disease)   . Hypercholesteremia   . Hypertension   . Panic attack   . PONV (postoperative nausea and vomiting)   . Pre-diabetes   . Sleep apnea    diagnosed but doesn't use CPAP; never got CPAP.    Past Surgical History:  Procedure Laterality Date  . ABDOMINAL HYSTERECTOMY    . BIOPSY  08/25/2017   Procedure: BIOPSY;  Surgeon: Danie Binder, MD;  [facility]: [facility];  Service: Endoscopy;;  gastric biopsy, gastric polyp  . CESAREAN SECTION     x 3  . CHOLECYSTECTOMY    . COLONOSCOPY WITH PROPOFOL N/A 08/25/2017   Procedure: COLONOSCOPY WITH PROPOFOL;  Surgeon: Danie Binder, MD;  Location: [facility];  Service: Endoscopy;  Laterality: N/A;  12:15pm  . ESOPHAGOGASTRODUODENOSCOPY (EGD) WITH PROPOFOL N/A 08/25/2017   Procedure: ESOPHAGOGASTRODUODENOSCOPY (EGD) WITH PROPOFOL;  Surgeon: Danie Binder, MD;  Location: [facility];  Service: Endoscopy;  Laterality: N/A;  . FOOT SURGERY Right    x 2-bone spur  . HAND SURGERY Right    x 2-carpal tunnel  . KNEE ARTHROSCOPY Left   . KNEE ARTHROSCOPY WITH MEDIAL MENISECTOMY Left 05/07/2017   Procedure: KNEE ARTHROSCOPY WITH MEDIAL MENISECTOMY AND LATERAL MENISECTOMY, limited debriedment;  Surgeon: Carole Civil, MD;  Location: [facility];  Service: Orthopedics;  Laterality:  Left;  . PARTIAL HYSTERECTOMY  2001   total abdominal  . POLYPECTOMY  08/25/2017   Procedure: POLYPECTOMY;  Surgeon: Danie Binder, MD;  Location: [facility];  Service: Endoscopy;;  rectal polyp cs  . SAVORY DILATION N/A 08/25/2017   Procedure: SAVORY DILATION;  Surgeon: Danie Binder, MD;  Location: [facility];  Service: Endoscopy;   Laterality: N/A;  . TRIGGER FINGER RELEASE Right 11/27/2016   Procedure: RELEASE TRIGGER FINGER/A-1 PULLEY RIGHT LONG FINGER;  Surgeon: Carole Civil, MD;  Location: [facility];  Service: Orthopedics;  Laterality: Right;  pt knows to arrive at 8:45    No current facility-administered medications for this encounter.    Current Outpatient Medications  Medication Sig Dispense Refill Last Dose  . acetaminophen (TYLENOL) 500 MG tablet Take 500 mg by mouth every 8 (eight) hours as needed for mild pain.   Taking  . gabapentin (NEURONTIN) 300 MG capsule Take 300 mg by mouth 3 (three) times daily.   1 Not Taking  . hydrochlorothiazide (MICROZIDE) 12.5 MG capsule Take 12.5 mg by mouth daily.   1 Taking  . HYDROcodone-acetaminophen (NORCO) 5-325 MG tablet Take 1 tablet by mouth every 6 (six) hours as needed for moderate pain. 60 tablet 0   . methocarbamol (ROBAXIN) 750 MG tablet Take 750 mg by mouth every 6 (six) hours as needed for muscle spasms.   1 Not Taking  . pantoprazole (PROTONIX) 40 MG tablet Take 40 mg by mouth daily.  2 Taking   Allergies  Allergen Reactions  . Codeine Shortness Of Breath and Nausea And Vomiting  . Aspirin Other (See Comments)    REACTION: stomach upset    Social History   Tobacco Use  . Smoking status: Never Smoker  . Smokeless tobacco: Never Used  Substance Use Topics  . Alcohol use: No    Family History  Problem Relation Age of Onset  . Heart disease Unknown   . Arthritis Unknown   . Cancer Unknown   . Diabetes Unknown   . Colon cancer Mother 32  . Gastric cancer Neg Hx   . Esophageal cancer Neg Hx      Review of Systems  Constitutional: Negative for chills and fever.  Cardiovascular: Negative.   Musculoskeletal: Negative.   Skin: Negative.   Psychiatric/Behavioral: Negative.   All other systems reviewed and are negative.   Objective:  Physical Exam  Constitutional: She is oriented to person, place, and time. She appears well-nourished.  Eyes:  Right eye exhibits no discharge. Left eye exhibits no discharge. No scleral icterus.  Neck: Neck supple. No JVD present. No tracheal deviation present.  Cardiovascular: Intact distal pulses.  Respiratory: Effort normal. No stridor.  GI: Soft. She exhibits no distension.  Musculoskeletal:       Arms:      Legs: Neurological: She is alert and oriented to person, place, and time. She has normal reflexes. She exhibits normal muscle tone. Coordination normal.  Skin: Skin is warm and dry. No rash noted. No erythema. No pallor.  Psychiatric: She has a normal mood and affect. Her behavior is normal. Thought content normal.    Vital signs in last 24 hours: Temp:  [98.5 F (36.9 C)] 98.5 F (36.9 C) (12/04 1503) Pulse Rate:  [71] 71 (12/04 1503) Resp:  [18] 18 (12/04 1503) BP: (137)/(69) 137/69 (12/04 1503) SpO2:  [100 %] 100 % (12/04 1503) Weight:  [251 lb 12.8 oz (114.2 kg)] 251 lb 12.8 oz (114.2 kg) (12/04  1503)  Labs:   Estimated body mass index is 44.6 kg/m as calculated from the following:   Height as of 09/22/17: 5\' 3"  (1.6 m).   Weight as of 09/22/17: 251 lb 12.8 oz (114.2 kg).   Imaging Review Plain radiographs demonstrate severe degenerative joint disease of the left knee(s). The overall alignment ismild varus. The bone quality appears to be good for age and reported activity level.  Assessment/Plan:  LEFT TKA    End stage arthritis, left knee   The patient history, physical examination, clinical judgment of the provider and imaging studies are consistent with end stage degenerative joint disease of the left knee(s) and total knee arthroplasty is deemed medically necessary. The treatment options including medical management, injection therapy arthroscopy and arthroplasty were discussed at length. The risks and benefits of total knee arthroplasty were presented and reviewed. The risks due to aseptic loosening, infection, stiffness, patella tracking problems, thromboembolic  complications and other imponderables were discussed. The patient acknowledged the explanation, agreed to proceed with the plan and consent was signed. Patient is being admitted for inpatient treatment for surgery, pain control, PT, OT, prophylactic antibiotics, VTE prophylaxis, progressive ambulation and ADL's and discharge planning. The patient is planning to be discharged home with home health services

## 2017-09-24 ENCOUNTER — Other Ambulatory Visit: Payer: Self-pay

## 2017-09-24 ENCOUNTER — Encounter (HOSPITAL_COMMUNITY): Payer: Self-pay | Admitting: *Deleted

## 2017-09-24 ENCOUNTER — Inpatient Hospital Stay (HOSPITAL_COMMUNITY): Payer: BLUE CROSS/BLUE SHIELD | Admitting: Anesthesiology

## 2017-09-24 ENCOUNTER — Encounter (HOSPITAL_COMMUNITY)
Admission: RE | Disposition: A | Discharge status: Home Health | Payer: Self-pay | Source: Ambulatory Visit | Attending: Orthopedic Surgery

## 2017-09-24 ENCOUNTER — Inpatient Hospital Stay (HOSPITAL_COMMUNITY): Payer: BLUE CROSS/BLUE SHIELD

## 2017-09-24 ENCOUNTER — Inpatient Hospital Stay (HOSPITAL_COMMUNITY)
Admission: RE | Admit: 2017-09-24 | Discharge: 2017-09-29 | DRG: 470 | Disposition: A | Discharge status: Home Health | Payer: BLUE CROSS/BLUE SHIELD | Source: Ambulatory Visit | Attending: Orthopedic Surgery | Admitting: Orthopedic Surgery

## 2017-09-24 DIAGNOSIS — K219 Gastro-esophageal reflux disease without esophagitis: Secondary | ICD-10-CM | POA: Diagnosis present

## 2017-09-24 DIAGNOSIS — Z800 Family history of malignant neoplasm of digestive organs: Secondary | ICD-10-CM | POA: Diagnosis not present

## 2017-09-24 DIAGNOSIS — Z9049 Acquired absence of other specified parts of digestive tract: Secondary | ICD-10-CM | POA: Diagnosis not present

## 2017-09-24 DIAGNOSIS — Z471 Aftercare following joint replacement surgery: Secondary | ICD-10-CM | POA: Diagnosis not present

## 2017-09-24 DIAGNOSIS — I080 Rheumatic disorders of both mitral and aortic valves: Secondary | ICD-10-CM | POA: Diagnosis not present

## 2017-09-24 DIAGNOSIS — I10 Essential (primary) hypertension: Secondary | ICD-10-CM | POA: Diagnosis present

## 2017-09-24 DIAGNOSIS — Z90710 Acquired absence of both cervix and uterus: Secondary | ICD-10-CM

## 2017-09-24 DIAGNOSIS — Z96652 Presence of left artificial knee joint: Secondary | ICD-10-CM | POA: Diagnosis not present

## 2017-09-24 DIAGNOSIS — Z885 Allergy status to narcotic agent status: Secondary | ICD-10-CM | POA: Diagnosis not present

## 2017-09-24 DIAGNOSIS — Z96659 Presence of unspecified artificial knee joint: Secondary | ICD-10-CM

## 2017-09-24 DIAGNOSIS — M1712 Unilateral primary osteoarthritis, left knee: Secondary | ICD-10-CM | POA: Diagnosis not present

## 2017-09-24 DIAGNOSIS — Z79899 Other long term (current) drug therapy: Secondary | ICD-10-CM | POA: Diagnosis not present

## 2017-09-24 DIAGNOSIS — Z886 Allergy status to analgesic agent status: Secondary | ICD-10-CM | POA: Diagnosis not present

## 2017-09-24 DIAGNOSIS — G4730 Sleep apnea, unspecified: Secondary | ICD-10-CM | POA: Diagnosis not present

## 2017-09-24 HISTORY — PX: TOTAL KNEE ARTHROPLASTY: SHX125

## 2017-09-24 SURGERY — ARTHROPLASTY, KNEE, TOTAL
Anesthesia: General | Site: Knee | Laterality: Left

## 2017-09-24 MED ORDER — METHOCARBAMOL 1000 MG/10ML IJ SOLN
500.0000 mg | Freq: Four times a day (QID) | INTRAVENOUS | Status: DC | PRN
  Filled 2017-09-24: qty 5

## 2017-09-24 MED ORDER — PROPOFOL 10 MG/ML IV BOLUS
INTRAVENOUS | Status: AC
  Filled 2017-09-24: qty 40

## 2017-09-24 MED ORDER — MIDAZOLAM HCL 2 MG/2ML IJ SOLN
1.0000 mg | Freq: Once | INTRAMUSCULAR | Status: AC | PRN
  Administered 2017-09-24: 2 mg via INTRAVENOUS

## 2017-09-24 MED ORDER — MIDAZOLAM HCL 2 MG/2ML IJ SOLN
INTRAMUSCULAR | Status: AC
  Filled 2017-09-24: qty 2

## 2017-09-24 MED ORDER — SODIUM CHLORIDE 0.9 % IV SOLN
INTRAVENOUS | Status: DC | PRN
  Administered 2017-09-24: 13:00:00 via INTRAVENOUS

## 2017-09-24 MED ORDER — MAGNESIUM CITRATE PO SOLN
1.0000 | Freq: Once | ORAL | Status: DC | PRN
Start: 2017-09-24 — End: 2017-09-29

## 2017-09-24 MED ORDER — SODIUM CHLORIDE 0.9 % IR SOLN
Status: DC | PRN
  Administered 2017-09-24: 3000 mL

## 2017-09-24 MED ORDER — HYDROCODONE-ACETAMINOPHEN 7.5-325 MG PO TABS
1.0000 | ORAL_TABLET | ORAL | Status: DC
  Administered 2017-09-24 – 2017-09-25 (×5): 1 via ORAL
  Filled 2017-09-24 (×5): qty 1

## 2017-09-24 MED ORDER — DIPHENHYDRAMINE HCL 12.5 MG/5ML PO ELIX: 12.5000 mg | ORAL_SOLUTION | ORAL | Status: DC | PRN

## 2017-09-24 MED ORDER — EPHEDRINE SULFATE 50 MG/ML IJ SOLN
INTRAMUSCULAR | Status: AC
  Filled 2017-09-24: qty 1

## 2017-09-24 MED ORDER — ONDANSETRON 4 MG PO TBDP
ORAL_TABLET | ORAL | Status: AC
  Filled 2017-09-24: qty 1

## 2017-09-24 MED ORDER — METHOCARBAMOL 500 MG PO TABS
500.0000 mg | ORAL_TABLET | Freq: Four times a day (QID) | ORAL | Status: DC | PRN
  Administered 2017-09-26 – 2017-09-27 (×2): 500 mg via ORAL
  Filled 2017-09-24 (×2): qty 1

## 2017-09-24 MED ORDER — BUPIVACAINE-EPINEPHRINE (PF) 0.5% -1:200000 IJ SOLN
INTRAMUSCULAR | Status: AC
  Filled 2017-09-24: qty 30

## 2017-09-24 MED ORDER — ONDANSETRON HCL 4 MG/2ML IJ SOLN
4.0000 mg | Freq: Once | INTRAMUSCULAR | Status: AC
  Administered 2017-09-24: 4 mg via INTRAVENOUS

## 2017-09-24 MED ORDER — BUPIVACAINE IN DEXTROSE 0.75-8.25 % IT SOLN
INTRATHECAL | Status: AC
  Filled 2017-09-24: qty 4

## 2017-09-24 MED ORDER — METOCLOPRAMIDE HCL 10 MG PO TABS
5.0000 mg | ORAL_TABLET | Freq: Three times a day (TID) | ORAL | Status: DC | PRN
  Filled 2017-09-24: qty 2

## 2017-09-24 MED ORDER — SODIUM CHLORIDE 0.9 % IV SOLN
INTRAVENOUS | Status: DC
  Administered 2017-09-24: 15:00:00 via INTRAVENOUS
  Administered 2017-09-24: 1 mL via INTRAVENOUS

## 2017-09-24 MED ORDER — SODIUM CHLORIDE 0.9 % IJ SOLN
INTRAMUSCULAR | Status: AC
  Filled 2017-09-24: qty 20

## 2017-09-24 MED ORDER — PREGABALIN 50 MG PO CAPS
ORAL_CAPSULE | ORAL | Status: AC
  Filled 2017-09-24: qty 1

## 2017-09-24 MED ORDER — LACTATED RINGERS IV SOLN
INTRAVENOUS | Status: DC
  Administered 2017-09-24: 09:00:00 via INTRAVENOUS

## 2017-09-24 MED ORDER — FENTANYL CITRATE (PF) 100 MCG/2ML IJ SOLN
INTRAMUSCULAR | Status: DC | PRN
  Administered 2017-09-24: 15 ug via INTRAVENOUS
  Administered 2017-09-24: 35 ug via INTRAVENOUS
  Administered 2017-09-24 (×2): 25 ug via INTRAVENOUS

## 2017-09-24 MED ORDER — BUPIVACAINE-EPINEPHRINE (PF) 0.5% -1:200000 IJ SOLN
INTRAMUSCULAR | Status: DC | PRN
  Administered 2017-09-24: 30 mL via PERINEURAL

## 2017-09-24 MED ORDER — BUPIVACAINE LIPOSOME 1.3 % IJ SUSP
INTRAMUSCULAR | Status: AC
  Filled 2017-09-24: qty 20

## 2017-09-24 MED ORDER — BISACODYL 5 MG PO TBEC: 5.0000 mg | DELAYED_RELEASE_TABLET | Freq: Every day | ORAL | Status: DC | PRN

## 2017-09-24 MED ORDER — LIDOCAINE HCL (PF) 1 % IJ SOLN
INTRAMUSCULAR | Status: AC
  Filled 2017-09-24: qty 5

## 2017-09-24 MED ORDER — TRANEXAMIC ACID 1000 MG/10ML IV SOLN
1000.0000 mg | Freq: Once | INTRAVENOUS | Status: AC
  Administered 2017-09-24: 1000 mg via INTRAVENOUS
  Filled 2017-09-24: qty 10

## 2017-09-24 MED ORDER — CEFAZOLIN SODIUM-DEXTROSE 2-4 GM/100ML-% IV SOLN
2.0000 g | Freq: Four times a day (QID) | INTRAVENOUS | Status: AC
  Administered 2017-09-24 – 2017-09-25 (×2): 2 g via INTRAVENOUS
  Filled 2017-09-24 (×3): qty 100

## 2017-09-24 MED ORDER — GABAPENTIN 300 MG PO CAPS
300.0000 mg | ORAL_CAPSULE | Freq: Three times a day (TID) | ORAL | Status: DC
  Administered 2017-09-24 – 2017-09-29 (×16): 300 mg via ORAL
  Filled 2017-09-24 (×16): qty 1

## 2017-09-24 MED ORDER — HYDROCHLOROTHIAZIDE 12.5 MG PO CAPS
12.5000 mg | ORAL_CAPSULE | Freq: Every day | ORAL | Status: DC
  Administered 2017-09-24 – 2017-09-29 (×6): 12.5 mg via ORAL
  Filled 2017-09-24 (×6): qty 1

## 2017-09-24 MED ORDER — CEFAZOLIN SODIUM-DEXTROSE 2-4 GM/100ML-% IV SOLN
2.0000 g | INTRAVENOUS | Status: AC
  Administered 2017-09-24: 2 g via INTRAVENOUS

## 2017-09-24 MED ORDER — METHOCARBAMOL 1000 MG/10ML IJ SOLN
500.0000 mg | Freq: Once | INTRAVENOUS | Status: AC
  Administered 2017-09-24: 500 mg via INTRAVENOUS
  Filled 2017-09-24: qty 5

## 2017-09-24 MED ORDER — SENNOSIDES-DOCUSATE SODIUM 8.6-50 MG PO TABS: 1.0000 | ORAL_TABLET | Freq: Every evening | ORAL | Status: DC | PRN

## 2017-09-24 MED ORDER — ROCURONIUM BROMIDE 50 MG/5ML IV SOLN
INTRAVENOUS | Status: AC
  Filled 2017-09-24: qty 1

## 2017-09-24 MED ORDER — FENTANYL CITRATE (PF) 100 MCG/2ML IJ SOLN
INTRAMUSCULAR | Status: AC
  Filled 2017-09-24: qty 2

## 2017-09-24 MED ORDER — BUPIVACAINE IN DEXTROSE 0.75-8.25 % IT SOLN
INTRATHECAL | Status: DC | PRN
  Administered 2017-09-24: 14 mg via INTRATHECAL

## 2017-09-24 MED ORDER — OXYCODONE HCL 5 MG PO TABS
ORAL_TABLET | ORAL | Status: AC
  Filled 2017-09-24: qty 1

## 2017-09-24 MED ORDER — PROPOFOL 500 MG/50ML IV EMUL
INTRAVENOUS | Status: DC | PRN
  Administered 2017-09-24: 25 ug/kg/min via INTRAVENOUS

## 2017-09-24 MED ORDER — PREGABALIN 50 MG PO CAPS
50.0000 mg | ORAL_CAPSULE | Freq: Once | ORAL | Status: AC
  Administered 2017-09-24: 50 mg via ORAL

## 2017-09-24 MED ORDER — ALUM & MAG HYDROXIDE-SIMETH 200-200-20 MG/5ML PO SUSP: 30.0000 mL | ORAL | Status: DC | PRN

## 2017-09-24 MED ORDER — TRANEXAMIC ACID 1000 MG/10ML IV SOLN
1000.0000 mg | INTRAVENOUS | Status: AC
  Administered 2017-09-24: 1000 mg via INTRAVENOUS
  Filled 2017-09-24: qty 10

## 2017-09-24 MED ORDER — OXYCODONE HCL 5 MG PO TABS
10.0000 mg | ORAL_TABLET | ORAL | Status: DC | PRN
  Administered 2017-09-24 – 2017-09-29 (×3): 10 mg via ORAL
  Filled 2017-09-24 (×3): qty 2

## 2017-09-24 MED ORDER — ONDANSETRON 4 MG PO TBDP
4.0000 mg | ORAL_TABLET | Freq: Once | ORAL | Status: AC
  Administered 2017-09-24: 4 mg via ORAL

## 2017-09-24 MED ORDER — ACETAMINOPHEN 650 MG RE SUPP: 650.0000 mg | RECTAL | Status: DC | PRN

## 2017-09-24 MED ORDER — ONDANSETRON HCL 4 MG PO TABS
4.0000 mg | ORAL_TABLET | Freq: Four times a day (QID) | ORAL | Status: DC | PRN
  Administered 2017-09-28: 4 mg via ORAL
  Filled 2017-09-24: qty 1

## 2017-09-24 MED ORDER — PHENOL 1.4 % MT LIQD: 1.0000 | OROMUCOSAL | Status: DC | PRN

## 2017-09-24 MED ORDER — SODIUM CHLORIDE 0.9 % IV SOLN
INTRAVENOUS | Status: DC | PRN
  Administered 2017-09-24: 60 mL

## 2017-09-24 MED ORDER — SODIUM CHLORIDE 0.9 % IJ SOLN
INTRAMUSCULAR | Status: AC
  Filled 2017-09-24: qty 40

## 2017-09-24 MED ORDER — CELECOXIB 400 MG PO CAPS
ORAL_CAPSULE | ORAL | Status: AC
  Filled 2017-09-24: qty 1

## 2017-09-24 MED ORDER — HYDROMORPHONE HCL 1 MG/ML IJ SOLN
0.5000 mg | INTRAMUSCULAR | Status: DC | PRN
  Administered 2017-09-24 – 2017-09-27 (×5): 0.5 mg via INTRAVENOUS
  Filled 2017-09-24 (×5): qty 1

## 2017-09-24 MED ORDER — OXYCODONE HCL 5 MG/5ML PO SOLN: 5.0000 mg | Freq: Once | ORAL | Status: DC | PRN

## 2017-09-24 MED ORDER — METOCLOPRAMIDE HCL 5 MG/ML IJ SOLN
5.0000 mg | Freq: Three times a day (TID) | INTRAMUSCULAR | Status: DC | PRN
  Filled 2017-09-24: qty 2

## 2017-09-24 MED ORDER — MIDAZOLAM HCL 5 MG/5ML IJ SOLN
INTRAMUSCULAR | Status: DC | PRN
  Administered 2017-09-24 (×2): 1 mg via INTRAVENOUS

## 2017-09-24 MED ORDER — DEXAMETHASONE SODIUM PHOSPHATE 10 MG/ML IJ SOLN
10.0000 mg | Freq: Once | INTRAMUSCULAR | Status: AC
  Administered 2017-09-25: 10 mg via INTRAVENOUS
  Filled 2017-09-24: qty 1

## 2017-09-24 MED ORDER — CEFAZOLIN SODIUM-DEXTROSE 2-4 GM/100ML-% IV SOLN
INTRAVENOUS | Status: AC
  Filled 2017-09-24: qty 100

## 2017-09-24 MED ORDER — HYDROMORPHONE HCL 1 MG/ML IJ SOLN
0.2500 mg | INTRAMUSCULAR | Status: DC | PRN
  Administered 2017-09-24 (×2): 1 mg via INTRAVENOUS
  Filled 2017-09-24 (×2): qty 1

## 2017-09-24 MED ORDER — HYDROCODONE-ACETAMINOPHEN 10-325 MG PO TABS: 1.0000 | ORAL_TABLET | ORAL | Status: DC | PRN

## 2017-09-24 MED ORDER — PANTOPRAZOLE SODIUM 40 MG PO TBEC
40.0000 mg | DELAYED_RELEASE_TABLET | Freq: Every day | ORAL | Status: DC
  Administered 2017-09-24 – 2017-09-29 (×6): 40 mg via ORAL
  Filled 2017-09-24 (×6): qty 1

## 2017-09-24 MED ORDER — ONDANSETRON HCL 4 MG/2ML IJ SOLN
4.0000 mg | Freq: Four times a day (QID) | INTRAMUSCULAR | Status: DC | PRN
  Administered 2017-09-24 – 2017-09-29 (×3): 4 mg via INTRAVENOUS
  Filled 2017-09-24 (×3): qty 2

## 2017-09-24 MED ORDER — CELECOXIB 400 MG PO CAPS
400.0000 mg | ORAL_CAPSULE | Freq: Once | ORAL | Status: AC
  Administered 2017-09-24: 400 mg via ORAL

## 2017-09-24 MED ORDER — SUCCINYLCHOLINE CHLORIDE 20 MG/ML IJ SOLN
INTRAMUSCULAR | Status: DC | PRN
  Administered 2017-09-24: 160 mg via INTRAVENOUS

## 2017-09-24 MED ORDER — ARTIFICIAL TEARS OPHTHALMIC OINT
TOPICAL_OINTMENT | OPHTHALMIC | Status: AC
  Filled 2017-09-24: qty 10.5

## 2017-09-24 MED ORDER — ONDANSETRON HCL 4 MG/2ML IJ SOLN
INTRAMUSCULAR | Status: AC
  Filled 2017-09-24: qty 2

## 2017-09-24 MED ORDER — CHLORHEXIDINE GLUCONATE 4 % EX LIQD: 60.0000 mL | Freq: Once | CUTANEOUS | Status: DC

## 2017-09-24 MED ORDER — ACETAMINOPHEN 325 MG PO TABS: 650.0000 mg | ORAL_TABLET | ORAL | Status: DC | PRN

## 2017-09-24 MED ORDER — OXYCODONE HCL 5 MG PO TABS
5.0000 mg | ORAL_TABLET | Freq: Once | ORAL | Status: AC
  Administered 2017-09-24: 5 mg via ORAL

## 2017-09-24 MED ORDER — MENTHOL 3 MG MT LOZG: 1.0000 | LOZENGE | OROMUCOSAL | Status: DC | PRN

## 2017-09-24 MED ORDER — 0.9 % SODIUM CHLORIDE (POUR BTL) OPTIME
TOPICAL | Status: DC | PRN
  Administered 2017-09-24: 1000 mL

## 2017-09-24 MED ORDER — OXYCODONE HCL 5 MG PO TABS: 5.0000 mg | ORAL_TABLET | Freq: Once | ORAL | Status: DC | PRN

## 2017-09-24 MED ORDER — APIXABAN 2.5 MG PO TABS
2.5000 mg | ORAL_TABLET | Freq: Two times a day (BID) | ORAL | Status: DC
  Administered 2017-09-25 – 2017-09-29 (×9): 2.5 mg via ORAL
  Filled 2017-09-24 (×9): qty 1

## 2017-09-24 MED ORDER — PROPOFOL 10 MG/ML IV BOLUS
INTRAVENOUS | Status: DC | PRN
Start: 2017-09-24 — End: 2017-09-24
  Administered 2017-09-24: 200 mg via INTRAVENOUS

## 2017-09-24 MED ORDER — DOCUSATE SODIUM 100 MG PO CAPS
100.0000 mg | ORAL_CAPSULE | Freq: Two times a day (BID) | ORAL | Status: DC
  Administered 2017-09-24 – 2017-09-29 (×10): 100 mg via ORAL
  Filled 2017-09-24 (×10): qty 1

## 2017-09-24 SURGICAL SUPPLY — 66 items
BAG HAMPER (MISCELLANEOUS) ×2 IMPLANT
BANDAGE ELASTIC 4 LF NS (GAUZE/BANDAGES/DRESSINGS) ×4 IMPLANT
BANDAGE ELASTIC 6 LF NS (GAUZE/BANDAGES/DRESSINGS) ×2 IMPLANT
BANDAGE ESMARK 6X9 LF (GAUZE/BANDAGES/DRESSINGS) ×1 IMPLANT
BIT DRILL 3.2X128 (BIT) IMPLANT
BLADE HEX COATED 2.75 (ELECTRODE) ×2 IMPLANT
BLADE SAGITTAL 25.0X1.27X90 (BLADE) ×2 IMPLANT
BNDG ESMARK 6X9 LF (GAUZE/BANDAGES/DRESSINGS) ×2
BOWL SMART MIX CTS (DISPOSABLE) IMPLANT
CAP KNEE TOTAL 3 SIGMA ×2 IMPLANT
CEMENT HV SMART SET (Cement) ×4 IMPLANT
CLOTH BEACON ORANGE TIMEOUT ST (SAFETY) ×2 IMPLANT
COOLER CRYO CUFF IC AND MOTOR (MISCELLANEOUS) ×2 IMPLANT
COVER LIGHT HANDLE STERIS (MISCELLANEOUS) ×4 IMPLANT
CUFF CRYO KNEE LG 20X31 COOLER (ORTHOPEDIC SUPPLIES) ×2 IMPLANT
CUFF TOURNIQUET SINGLE 34IN LL (TOURNIQUET CUFF) ×2 IMPLANT
DECANTER SPIKE VIAL GLASS SM (MISCELLANEOUS) ×2 IMPLANT
DRAPE BACK TABLE (DRAPES) ×2 IMPLANT
DRAPE EXTREMITY T 121X128X90 (DRAPE) ×2 IMPLANT
DRESSING AQUACEL AG ADV 3.5X12 (MISCELLANEOUS) ×1 IMPLANT
DRSG AQUACEL AG ADV 3.5X12 (MISCELLANEOUS) ×2
DRSG MEPILEX BORDER 4X12 (GAUZE/BANDAGES/DRESSINGS) ×2 IMPLANT
DURAPREP 26ML APPLICATOR (WOUND CARE) ×4 IMPLANT
ELECT REM PT RETURN 9FT ADLT (ELECTROSURGICAL) ×2
ELECTRODE REM PT RTRN 9FT ADLT (ELECTROSURGICAL) ×1 IMPLANT
EVACUATOR 3/16  PVC DRAIN (DRAIN) ×1
EVACUATOR 3/16 PVC DRAIN (DRAIN) ×1 IMPLANT
GLOVE BIO SURGEON STRL SZ7 (GLOVE) ×4 IMPLANT
GLOVE BIOGEL PI IND STRL 7.0 (GLOVE) ×3 IMPLANT
GLOVE BIOGEL PI INDICATOR 7.0 (GLOVE) ×3
GLOVE SKINSENSE NS SZ8.0 LF (GLOVE) ×2
GLOVE SKINSENSE STRL SZ8.0 LF (GLOVE) ×2 IMPLANT
GLOVE SS N UNI LF 8.5 STRL (GLOVE) ×2 IMPLANT
GOWN STRL REUS W/ TWL LRG LVL3 (GOWN DISPOSABLE) ×1 IMPLANT
GOWN STRL REUS W/TWL LRG LVL3 (GOWN DISPOSABLE) ×5 IMPLANT
HANDPIECE INTERPULSE COAX TIP (DISPOSABLE) ×1
HOOD W/PEELAWAY (MISCELLANEOUS) ×6 IMPLANT
INST SET MAJOR BONE (KITS) ×2 IMPLANT
IV NS IRRIG 3000ML ARTHROMATIC (IV SOLUTION) ×2 IMPLANT
KIT BLADEGUARD II DBL (SET/KITS/TRAYS/PACK) ×2 IMPLANT
KIT ROOM TURNOVER APOR (KITS) ×2 IMPLANT
MANIFOLD NEPTUNE II (INSTRUMENTS) ×2 IMPLANT
MARKER SKIN DUAL TIP RULER LAB (MISCELLANEOUS) ×2 IMPLANT
NEEDLE HYPO 21X1.5 SAFETY (NEEDLE) ×2 IMPLANT
NS IRRIG 1000ML POUR BTL (IV SOLUTION) ×2 IMPLANT
PACK TOTAL JOINT (CUSTOM PROCEDURE TRAY) ×2 IMPLANT
PAD ARMBOARD 7.5X6 YLW CONV (MISCELLANEOUS) ×2 IMPLANT
PAD DANNIFLEX CPM (ORTHOPEDIC SUPPLIES) ×2 IMPLANT
PIN TROCAR 3 INCH (PIN) IMPLANT
SAW OSC TIP CART 19.5X105X1.3 (SAW) ×2 IMPLANT
SET BASIN LINEN APH (SET/KITS/TRAYS/PACK) ×2 IMPLANT
SET HNDPC FAN SPRY TIP SCT (DISPOSABLE) ×1 IMPLANT
STAPLER VISISTAT 35W (STAPLE) ×2 IMPLANT
SUT BRALON NAB BRD #1 30IN (SUTURE) ×4 IMPLANT
SUT MNCRL 0 VIOLET CTX 36 (SUTURE) ×1 IMPLANT
SUT MON AB 0 CT1 (SUTURE) ×2 IMPLANT
SUT MON AB 2-0 CT1 36 (SUTURE) IMPLANT
SUT MONOCRYL 0 CTX 36 (SUTURE) ×1
SYR 20CC LL (SYRINGE) ×6 IMPLANT
SYR 30ML LL (SYRINGE) ×4 IMPLANT
SYR BULB IRRIGATION 50ML (SYRINGE) ×2 IMPLANT
TOWEL OR 17X26 4PK STRL BLUE (TOWEL DISPOSABLE) ×2 IMPLANT
TOWER CARTRIDGE SMART MIX (DISPOSABLE) ×2 IMPLANT
TRAY FOLEY W/METER SILVER 16FR (SET/KITS/TRAYS/PACK) ×2 IMPLANT
WATER STERILE IRR 1000ML POUR (IV SOLUTION) ×4 IMPLANT
YANKAUER SUCT 12FT TUBE ARGYLE (SUCTIONS) ×2 IMPLANT

## 2017-09-24 NOTE — Anesthesia Procedure Notes (Signed)
Procedure Name: MAC Date/Time: 09/24/2017 10:51 AM Performed by: Andree Elk Robby Bulkley A, CRNA Pre-anesthesia Checklist: Patient identified, Timeout performed, Emergency Drugs available, Suction available and Patient being monitored Oxygen Delivery Method: Simple face mask

## 2017-09-24 NOTE — Anesthesia Procedure Notes (Signed)
Procedure Name: Intubation Date/Time: 09/24/2017 11:47 AM Performed by: Andree Elk, Amy A, CRNA Pre-anesthesia Checklist: Patient identified, Patient being monitored, Timeout performed, Emergency Drugs available and Suction available Patient Re-evaluated:Patient Re-evaluated prior to induction Oxygen Delivery Method: Circle System Utilized and Circle system utilized Preoxygenation: Pre-oxygenation with 100% oxygen Induction Type: IV induction, Rapid sequence and Cricoid Pressure applied Ventilation: Mask ventilation without difficulty Laryngoscope Size: Mac and 3 Grade View: Grade I Tube type: Oral Tube size: 7.0 mm Number of attempts: 1 Airway Equipment and Method: Stylet Placement Confirmation: ETT inserted through vocal cords under direct vision,  positive ETCO2 and breath sounds checked- equal and bilateral Secured at: 21 cm Tube secured with: Tape Dental Injury: Teeth and Oropharynx as per pre-operative assessment

## 2017-09-24 NOTE — Brief Op Note (Signed)
09/24/2017  1:16 PM  PATIENT:  Rhonda Patton  56 y.o. female  PRE-OPERATIVE DIAGNOSIS:  PRIMARY OSTEOARTHRITIS LEFT KNEE  POST-OPERATIVE DIAGNOSIS:  PRIMARY OSTEOARTHRITIS LEFT KNEE  PROCEDURE:  Procedure(s): TOTAL KNEE ARTHROPLASTY (Left)   DEPUY 37F 4 T 10 PS POLY 38 P  SURGEON:  Surgeon(s) and Role:    * Carole Civil, MD - Primary  PHYSICIAN ASSISTANT:   ASSISTANTS: cynthia wrenn   ANESTHESIA:   spinal and general  EBL:  none  BLOOD ADMINISTERED:none  DRAINS: hemovac   LOCAL MEDICATIONS USED:  MARCAINE w epi  , Amount: 30 ml and OTHER exparel diluted with 40 cc saline   SPECIMEN:  No Specimen  DISPOSITION OF SPECIMEN:  N/A  COUNTS:  YES  TOURNIQUET:   Total Tourniquet Time Documented: Thigh (Left) - 92 minutes Total: Thigh (Left) - 92 minutes   DICTATION: .Viviann Spare Dictation  PLAN OF CARE: Admit to inpatient   PATIENT DISPOSITION:  PACU - hemodynamically stable.   Delay start of Pharmacological VTE agent (>24hrs) due to surgical blood loss or risk of bleeding: yes

## 2017-09-24 NOTE — Anesthesia Procedure Notes (Signed)
Spinal  Patient location during procedure: OR Start time: 09/24/2017 11:11 AM Staffing Anesthesiologist: Mikey College, MD Resident/CRNA: Mickel Baas, CRNA Preanesthetic Checklist Completed: patient identified, site marked, surgical consent, pre-op evaluation, timeout performed, IV checked, risks and benefits discussed and monitors and equipment checked Spinal Block Patient position: left lateral decubitus Prep: ChloraPrep Patient monitoring: heart rate, cardiac monitor, continuous pulse ox and blood pressure Approach: left paramedian Location: L3-4 Injection technique: single-shot Needle Needle type: Spinocan  Needle gauge: 22 G Needle length: 9 cm Assessment Sensory level: T8 Additional Notes  ATTEMPTS:Attempt x2 by Andree Elk, CRNA; Placed on 2nd attempt  by Dr. Daiva Huge VK:[id number] [place] DATE:08/20/2019

## 2017-09-24 NOTE — Progress Notes (Signed)
PT Cancellation Note  Patient Details Name: Rhonda Patton MRN: [id number] DOB: 1961-07-17   Cancelled Treatment:    Reason Eval/Treat Not Completed: Fatigue/lethargy limiting ability to participate.  Patient to lethargic and painful to participate with therapy, just brought from OR and instructed in post op bed exercises with understanding acknowledged.  Will check back tomorrow in AM.   3:46 PM, 09/24/17 Lonell Grandchild, MPT Physical Therapist with [facility] [address] [phone] office [phone] mobile phone

## 2017-09-24 NOTE — Progress Notes (Signed)
Pt arrived on floor. Vitals stable, pt reports pain a 10/10. Will give pain medication as soon as verified. Cryocuff, hemo-vac, and CPM in place. Will continue to monitor.

## 2017-09-24 NOTE — Interval H&P Note (Signed)
History and Physical Interval Note:  09/24/2017 10:21 AM  Rhonda Patton  has presented today for surgery, with the diagnosis of PRIMARY OSTEOARTHRITIS KNEE  The various methods of treatment have been discussed with the patient and family. After consideration of risks, benefits and other options for treatment, the patient has consented to  Procedure(s): TOTAL KNEE ARTHROPLASTY (Left) as a surgical intervention .  The patient's history has been reviewed, patient examined, no change in status, stable for surgery.  I have reviewed the patient's chart and labs.  Questions were answered to the patient's satisfaction.     [name]

## 2017-09-24 NOTE — Anesthesia Postprocedure Evaluation (Signed)
Anesthesia Post Note  Patient: Rhonda Patton  Procedure(s) Performed: TOTAL KNEE ARTHROPLASTY (Left Knee)  Patient location during evaluation: PACU Anesthesia Type: Spinal Level of consciousness: awake, oriented and patient cooperative Pain management: pain level controlled Vital Signs Assessment: post-procedure vital signs reviewed and stable Respiratory status: spontaneous breathing and respiratory function stable Cardiovascular status: stable Postop Assessment: no apparent nausea or vomiting Anesthetic complications: no     Last Vitals: There were no vitals filed for this visit.  Last Pain: There were no vitals filed for this visit.               ADAMS, AMY A

## 2017-09-24 NOTE — Progress Notes (Signed)
90 cc of bloody discharge emptied from hemovac. Will continue to monitor.

## 2017-09-24 NOTE — Op Note (Signed)
09/24/2017  1:16 PM  PATIENT:  Rhonda Patton  56 y.o. female  PRE-OPERATIVE DIAGNOSIS:  PRIMARY OSTEOARTHRITIS LEFT KNEE  POST-OPERATIVE DIAGNOSIS:  PRIMARY OSTEOARTHRITIS LEFT KNEE  PROCEDURE:  Procedure(s): TOTAL KNEE ARTHROPLASTY (Left)   DEPUY 53F 4 T 10 PS POLY 38 P  SURGEON:  Surgeon(s) and Role:    * Carole Civil, MD - Primary  PHYSICIAN ASSISTANT:   ASSISTANTS: cynthia wrenn   ANESTHESIA:   spinal and general  EBL:  none  BLOOD ADMINISTERED:none  DRAINS: hemovac   LOCAL MEDICATIONS USED:  MARCAINE w epi  , Amount: 30 ml and OTHER exparel diluted with 40 cc saline   SPECIMEN:  No Specimen  DISPOSITION OF SPECIMEN:  N/A  COUNTS:  YES  TOURNIQUET:   Total Tourniquet Time Documented: Thigh (Left) - 92 minutes Total: Thigh (Left) - 92 minutes   DICTATION: .Viviann Spare Dictation  Details of the procedure  Patient was identified in the preop area chart review was completed repeat examination was performed and surgical site was confirmed and marked.  The patient was taken to the operating for spinal anesthesia which was supplemented by general anesthesia.  The leg was prepped and draped sterilely after the insertion of the Foley catheter.  Timeout was completed  A midline incision was made after exsanguination of the limb with a 6 inch Esmarch and elevation of the tourniquet at 300 mmHg  The subcutaneous tissue was divided down to the extensor mechanism.  A medial arthrotomy was performed.  The patella was scarred down and there was extensive scarring in the joint  I therefore asked that the CPM be placed in the PACU.  After debridement of the posterior surface of the patellar tendon release of the patella tendon from the proximal tibia and release of the patellofemoral ligament was able to evert the patella.  Soft tissue sleeve was elevated from the medial tibia performing a medial release to the posteromedial corner  The knee was subluxated  forward and the ACL PCL and meniscal tissue was removed.  The medial compartment was grade 4 on both sides of the joint the ACL and PCL were intact and the lateral compartment showed no wear in the patellofemoral compartment showed grade 4 wear  A drill was placed in the center of the femur and drilled into the femoral canal the canal was decompressed with suction and irrigation.  A 5 degree left 11 mm resection was performed using intramedullary guide.  The femur was then sized to a size 5 and a 4 in 1 cutting block and external rotation of 3 degrees was placed and a 4 distal femoral cuts were performed  We then subluxated the tibia forward and placed a Hohmann retractor posteriorly we remove the remaining meniscal remnants and then set the tibial external alignment guide for a 4 mm resection from the lower medial side.  Appropriate slope and neutral varus valgus external alignment was set and the proximal tibia was resected.  The tibia measured a 4  We placed spacer blocks with a 10 mm block we were stable in flexion extension with equal gaps.  We proceeded to cut the notch.  We measured the patella to 25 mm cut it down to 14 we used a 38 mm button which was 9 mm in thickness  We performed a trial reduction patella tracked normally implants fit well stability tests were normal flexion was 115 degrees extension was full  We removed the trial implant plants then punched the tibia  then irrigated the bone prepared for cementation  When the cement was prepared we placed it on the implants in place the implants and allow the cement to cure.  Excess cement was removed exparel and Marcaine were injected into the joint.  Second irrigation was performed drain was placed excess cement was removed and closure was performed with #1 Braylon and #0 Monocryl  Skin staples were used to reapproximate the skin sterile dressing was applied and the limb was wrapped with Ace bandages from toes to groin.  The  tourniquet was released the foot was viable with good color and capillary refill and the Cryo/Cuff was placed and activated    PLAN OF CARE: Admit to inpatient   PATIENT DISPOSITION:  PACU - hemodynamically stable.   Delay start of Pharmacological VTE agent (>24hrs) due to surgical blood loss or risk of bleeding: yes

## 2017-09-24 NOTE — Transfer of Care (Signed)
Immediate Anesthesia Transfer of Care Note  Patient: Rhonda Patton  Procedure(s) Performed: TOTAL KNEE ARTHROPLASTY (Left Knee)  Patient Location: PACU  Anesthesia Type:General  Level of Consciousness: drowsy and patient cooperative  Airway & Oxygen Therapy: Patient Spontanous Breathing and Patient connected to face mask oxygen  Post-op Assessment: Report given to RN and Post -op Vital signs reviewed and stable  Post vital signs: Reviewed and stable  Last Vitals: There were no vitals filed for this visit.  Last Pain: There were no vitals filed for this visit.       Complications: No apparent anesthesia complications

## 2017-09-24 NOTE — Anesthesia Preprocedure Evaluation (Addendum)
Anesthesia Evaluation    History of Anesthesia Complications (+) PONV  Airway Mallampati: I  TM Distance: >3 FB Neck ROM: Full    Dental  (+) Teeth Intact   Pulmonary sleep apnea ,    Pulmonary exam normal        Cardiovascular hypertension, Pt. on medications + angina Normal cardiovascular exam+ Valvular Problems/Murmurs AI and MR  Rhythm:Regular Rate:Normal  ECG:  NSR  Sept 2018   Neuro/Psych Anxiety    GI/Hepatic GERD  Medicated and Controlled,  Endo/Other    Renal/GU      Musculoskeletal   Abdominal (+) + obese,   Peds  Hematology Results for RONALEE, SCHEUNEMANN (MRN [id number]) as of 09/24/2017 08:52  09/22/2017 15:14 Hemoglobin: 13.8 HCT: 42.1 MCV: 87.5 MCH: 28.7 MCHC: 32.8 RDW: 13.7 Platelets: 191    Anesthesia Other Findings   Reproductive/Obstetrics                            Anesthesia Physical Anesthesia Plan  ASA: III  Anesthesia Plan: Spinal   Post-op Pain Management:    Induction:   PONV Risk Score and Plan:   Airway Management Planned: Nasal Cannula  Additional Equipment:   Intra-op Plan:   Post-operative Plan:   Informed Consent: I have reviewed the patients History and Physical, chart, labs and discussed the procedure including the risks, benefits and alternatives for the proposed anesthesia with the patient or authorized representative who has indicated his/her understanding and acceptance.   Dental advisory given  Plan Discussed with: CRNA and Surgeon  Anesthesia Plan Comments:         Anesthesia Quick Evaluation

## 2017-09-25 ENCOUNTER — Encounter (HOSPITAL_COMMUNITY): Payer: Self-pay | Admitting: Orthopedic Surgery

## 2017-09-25 LAB — BASIC METABOLIC PANEL
Anion gap: 6 (ref 5–15)
BUN: 9 mg/dL (ref 6–20)
CHLORIDE: 106 mmol/L (ref 101–111)
CO2: 26 mmol/L (ref 22–32)
Calcium: 8.4 mg/dL — ABNORMAL LOW (ref 8.9–10.3)
Creatinine, Ser: 0.87 mg/dL (ref 0.44–1.00)
GFR calc Af Amer: >60 mL/min (ref >60)
GFR calc non Af Amer: >60 mL/min (ref >60)
GLUCOSE: 123 mg/dL — AB (ref 65–99)
POTASSIUM: 3.7 mmol/L (ref 3.5–5.1)
SODIUM: 138 mmol/L (ref 135–145)

## 2017-09-25 LAB — CBC
HEMATOCRIT: 36.7 % (ref 36.0–46.0)
HEMOGLOBIN: 11.7 g/dL — AB (ref 12.0–15.0)
MCH: 28.1 pg (ref 26.0–34.0)
MCHC: 31.9 g/dL (ref 30.0–36.0)
MCV: 88.2 fL (ref 78.0–100.0)
Platelets: 176 10*3/uL (ref 150–400)
RBC: 4.16 MIL/uL (ref 3.87–5.11)
RDW: 14 % (ref 11.5–15.5)
WBC: 7.2 10*3/uL (ref 4.0–10.5)

## 2017-09-25 LAB — GLUCOSE, CAPILLARY: GLUCOSE-CAPILLARY: 136 mg/dL — AB (ref 65–99)

## 2017-09-25 MED ORDER — HYDROCODONE-ACETAMINOPHEN 10-325 MG PO TABS
1.0000 | ORAL_TABLET | ORAL | Status: DC
  Administered 2017-09-25 – 2017-09-26 (×7): 1 via ORAL
  Filled 2017-09-25 (×7): qty 1

## 2017-09-25 NOTE — Care Management Note (Signed)
Case Management Note  Patient Details  Name: Rhonda Patton MRN: [id number] Date of Birth: [birth year]       S/p TKR. Pt is from home with family. She needs a RW. Pt has no preference of providers for RW. [facility] rep, aware of referral and will deliver DME to pt room prior to DC. Referral for CPM and HH have been arranged prior to surgery by surgeon's office. CM has verified [facility] has received orders and plan to deliver CPM at DC on Saturday. Tim, [facility] rep, verified referral. (Pt given optio of choosing another facility, wanted to use [facility] as that is who surgeon recommended.). PT has recommend SNF, pt would like SNF but is aware insurance has to authorize SNF and we need to plan on [facility] as a back up plan. CSW aware of referral and will work pt up. [facility] [employer], Tim, aware of potential change to plan.     Expected Discharge Date:       09/26/2017           Expected Discharge Plan:  [facility](VS SNF)  In-House Referral:  Clinical Social Work  Discharge planning Services  CM Consult  Post Acute Care Choice:  Home Health, Durable Medical Equipment Choice offered to:  Patient  DME Arranged:  Walker rolling DME Agency:  [place]:  [place]:  [facility]  Status of Service:  Completed, signed off  Sherald Barge, RN 09/25/2017, 1:52 PM

## 2017-09-25 NOTE — Evaluation (Signed)
Physical Therapy Evaluation Patient Details Name: Rhonda Patton MRN: [id number] DOB: Jan 05, 1961 Today's Date: 09/25/2017   History of Present Illness  Rhonda Patton is a 56 y/o female Post-op left TKA. completed on 09/24/17 with the diagnosis of PRIMARY OSTEOARTHRITIS KNEE      Clinical Impression  Patient limited for taking steps secondary to c/o increasing left knee pain, demonstrates good return for using UE's during bed mobility with verbal cues and tolerated sitting up in chair with LLE elevated, tolerated dangling for approximately 5 minutes.  Patient will benefit from continued physical therapy in hospital and recommended venue below to increase strength, balance, endurance for safe ADLs and gait.    Follow Up Recommendations SNF;Supervision/Assistance - 24 hour    Equipment Recommendations  Rolling walker with 5" wheels    Recommendations for Other Services       Precautions / Restrictions Precautions Precautions: Fall Precaution Comments: due to recent knee surgery Restrictions Weight Bearing Restrictions: Yes Other Position/Activity Restrictions: WBAT LLE      Mobility  Bed Mobility Overal bed mobility: Needs Assistance Bed Mobility: Supine to Sit     Supine to sit: Min assist     General bed mobility comments: VC's to use BUE for sitting up and scooting to bedside with good return demonstrated, required assistance to move LLE  Transfers Overall transfer level: Needs assistance Equipment used: Rolling walker (2 wheeled) Transfers: Sit to/from [facility] Sit to Stand: Min assist Stand pivot transfers: Min assist       General transfer comment: VC's for proper hand placement  Ambulation/Gait Ambulation/Gait assistance: Min assist Ambulation Distance (Feet): 8 Feet Assistive device: Rolling walker (2 wheeled) Gait Pattern/deviations: Decreased step length - left;Decreased stance time - left;Decreased stride length   Gait  velocity interpretation: Below normal speed for age/gender General Gait Details: Patient demonstrates slow labored cadence and limited to a few steps in room due to c/o fatigue and increasing left knee pain  Stairs            Wheelchair Mobility    Modified Rankin (Stroke Patients Only)       Balance Overall balance assessment: Needs assistance Sitting-balance support: Feet supported;No upper extremity supported Sitting balance-Leahy Scale: Good     Standing balance support: Bilateral upper extremity supported;During functional activity Standing balance-Leahy Scale: Fair                               Pertinent Vitals/Pain Pain Assessment: 0-10 Pain Score: 8  Pain Location: Left knee Pain Descriptors / Indicators: Aching;Constant;Grimacing Pain Intervention(s): Limited activity within patient's tolerance;Monitored during session;Premedicated before session    Home Living Family/patient expects to be discharged to:: Private residence Living Arrangements: Spouse/significant other;Children Available Help at Discharge: (Husband works full time) Type of Home: House Home Access: Stairs to enter Entrance Stairs-Rails: None Technical brewer of Steps: 1 step Home Layout: One level Home Equipment: Cane - single point;Walker - standard;Bedside commode      Prior Function Level of Independence: Independent         Comments: Patient is primary caregiver for disabled daughter. Pt still drives.     Hand Dominance   Dominant Hand: Right    Extremity/Trunk Assessment   Upper Extremity Assessment Upper Extremity Assessment: Defer to OT evaluation    Lower Extremity Assessment Lower Extremity Assessment: Generalized weakness;RLE deficits/detail;LLE deficits/detail RLE Deficits / Details: grossly 5/5 LLE Deficits / Details: grossly -3/5  Communication   Communication: No difficulties  Cognition Arousal/Alertness: Awake/alert Behavior  During Therapy: WFL for tasks assessed/performed Overall Cognitive Status: Within Functional Limits for tasks assessed                                        General Comments      Exercises Total Joint Exercises Ankle Circles/Pumps: Supine;AROM;Left;5 reps;Strengthening Quad Sets: Supine;AAROM;Left;Strengthening;5 reps Short Arc Quad: Supine;AAROM;Strengthening;Left;5 reps Heel Slides: Supine;AAROM;Strengthening;Left;5 reps Goniometric ROM: LEFT KNEE: 65 degrees   Assessment/Plan    PT Assessment Patient needs continued PT services  PT Problem List Decreased strength;Decreased range of motion;Decreased activity tolerance;Decreased balance;Decreased mobility       PT Treatment Interventions Gait training;Functional mobility training;Therapeutic activities;Stair training;Therapeutic exercise;Patient/family education    PT Goals (Current goals can be found in the Care Plan section)  Acute Rehab PT Goals Patient Stated Goal: return home able to take care of daughter PT Goal Formulation: With patient Time For Goal Achievement: 10/02/17 Potential to Achieve Goals: Good    Frequency 7X/week   Barriers to discharge        Co-evaluation   Reason for Co-Treatment: To address functional/ADL transfers   OT goals addressed during session: ADL's and self-care;Proper use of Adaptive equipment and DME;Strengthening/ROM       AM-PAC PT "6 Clicks" Daily Activity  Outcome Measure Difficulty turning over in bed (including adjusting bedclothes, sheets and blankets)?: A Little Difficulty moving from lying on back to sitting on the side of the bed? : A Little Difficulty sitting down on and standing up from a chair with arms (e.g., wheelchair, bedside commode, etc,.)?: A Little Help needed moving to and from a bed to chair (including a wheelchair)?: A Little Help needed walking in hospital room?: A Lot Help needed climbing 3-5 steps with a railing? : A Lot 6 Click Score:  16    End of Session Equipment Utilized During Treatment: Gait belt Activity Tolerance: Patient limited by fatigue;Patient limited by pain Patient left: in chair;with call bell/phone within reach Nurse Communication: Mobility status PT Visit Diagnosis: Unsteadiness on feet (R26.81);Other abnormalities of gait and mobility (R26.89);Muscle weakness (generalized) (M62.81)    Time: 1601-0932 PT Time Calculation (min) (ACUTE ONLY): 26 min   Charges:   PT Evaluation $PT Eval Moderate Complexity: 1 Mod PT Treatments $Therapeutic Activity: 23-37 mins   PT G Codes:   PT G-Codes **NOT FOR INPATIENT CLASS** Functional Assessment Tool Used: AM-PAC 6 Clicks Basic Mobility Functional Limitation: Mobility: Walking and moving around Mobility: Walking and Moving Around Current Status (T5573): At least 40 percent but less than 60 percent impaired, limited or restricted Mobility: Walking and Moving Around Goal Status [phone]): At least 40 percent but less than 60 percent impaired, limited or restricted Mobility: Walking and Moving Around Discharge Status [phone]): At least 40 percent but less than 60 percent impaired, limited or restricted    12:02 PM, 09/25/17 Lonell Grandchild, MPT Physical Therapist with [facility] 336 [phone] office [phone] mobile phone

## 2017-09-25 NOTE — Clinical Social Work Note (Signed)
Clinical Social Work Assessment  Patient Details  Name: Rhonda Patton MRN: [id number] Date of Birth: August 08, 1961  Date of referral:  09/25/17               Reason for consult:  Facility Placement                Permission sought to share information with:    Permission granted to share information::     Name::        Agency::     Relationship::     Contact Information:     Housing/Transportation Living arrangements for the past 2 months:  Single Family Home Source of Information:  Patient Patient Interpreter Needed:  None Criminal Activity/Legal Involvement Pertinent to Current Situation/Hospitalization:  No - Comment as needed Significant Relationships:  Adult Children, Spouse Lives with:  Spouse, Adult Children Do you feel safe going back to the place where you live?  Yes Need for family participation in patient care:  Yes (Comment)  Care giving concerns:  Independent at baseline.    Social Worker assessment / plan: Patient lives with her spouse and daughter. She states that another daughter check in on her often. At baseline, patient ambulates with a cane, is independent in his ADLs, and drives. Patient is unsure as to whether she desires to go to SNF at discharge and reports that she may want to go home with HHPT. Patient did provide permission to send clinical information out of facilities in case she wanted to go to SNF at d/c.   Employment status:  Unemployed Forensic scientist:  Managed Care PT Recommendations:  [place] / Referral to community resources:  [place]  Patient/Family's Response to care:  Patient is considering SNF.   Patient/Family's Understanding of and Emotional Response to Diagnosis, Current Treatment, and Prognosis:  Patient understands her diagnosis, treatment and prognosis.   Emotional Assessment Appearance:  Appears stated age Attitude/Demeanor/Rapport:    Affect (typically observed):   Accepting, Calm Orientation:  Oriented to Self, Oriented to Place, Oriented to  Time, Oriented to Situation Alcohol / Substance use:  Not Applicable Psych involvement (Current and /or in the community):  No (Comment)  Discharge Needs  Concerns to be addressed:  Discharge Planning Concerns Readmission within the last 30 days:  No Current discharge risk:  None Barriers to Discharge:  No Barriers Identified   Ihor Gully, LCSW 09/25/2017, 2:37 PM

## 2017-09-25 NOTE — Addendum Note (Signed)
Addendum  created 09/25/17 7737 by Vista Deck, CRNA   Sign clinical note

## 2017-09-25 NOTE — Evaluation (Signed)
Occupational Therapy Evaluation Patient Details Name: Rhonda Patton MRN: [id number] DOB: 1961/03/24 Today's Date: 09/25/2017    History of Present Illness Post-op left TKA. completed on 09/24/17.   Clinical Impression   Pt in bed upon therapy arrival and agreeable to participate in evaluation. Patient reports increased pain although she has not been requesting pain medication. She reports that she was given pain medication at 6:30 AM. Patient presents with increase need for assistance with all daily tasks especially dressing and bathing due to recent surgery. Patient's pain is a limiting factor and will do better once she gets under a pain management routine. Recommend discharge to SNF for short term rehab prior to returning home as her husband works full time and she is the primary caretaker of her daughter whom is disabled.     Follow Up Recommendations  SNF    Equipment Recommendations  Tub/shower seat       Precautions / Restrictions Precautions Precautions: Fall Precaution Comments: due to recent knee surgery Restrictions Weight Bearing Restrictions: No Other Position/Activity Restrictions: WBAT LLE             ADL either performed or assessed with clinical judgement   ADL Overall ADL's : Needs assistance/impaired Eating/Feeding: Independent;Sitting   Grooming: Wash/dry hands;Set up;Sitting               Lower Body Dressing: Total assistance;Bed level Lower Body Dressing Details (indicate cue type and reason): donning socks Toilet Transfer: Minimal assistance;RW;Cueing for safety;Cueing for sequencing                   Vision Baseline Vision/History: No visual deficits Patient Visual Report: No change from baseline              Pertinent Vitals/Pain Pain Assessment: 0-10 Pain Score: 8  Pain Location: Left knee Pain Descriptors / Indicators: Aching;Constant;Grimacing Pain Intervention(s): Limited activity within patient's  tolerance;Premedicated before session;Repositioned     Hand Dominance Right   Extremity/Trunk Assessment Upper Extremity Assessment Upper Extremity Assessment: Overall WFL for tasks assessed   Lower Extremity Assessment Lower Extremity Assessment: Defer to PT evaluation       Communication Communication Communication: No difficulties   Cognition Arousal/Alertness: Awake/alert Behavior During Therapy: WFL for tasks assessed/performed Overall Cognitive Status: Within Functional Limits for tasks assessed                      Home Living Family/patient expects to be discharged to:: Private residence Living Arrangements: Spouse/significant other;Children(daughter is disabled and patiet cares for her.) Available Help at Discharge: (Husband works full time) Type of Home: House Home Access: Stairs to enter Technical brewer of Steps: 1 step Entrance Stairs-Rails: None Home Layout: One level     Bathroom Shower/Tub: Corporate investment banker: Standard     Home Equipment: Cane - single point;Walker - standard;Bedside commode          Prior Functioning/Environment Level of Independence: Independent        Comments: Patient is primary caregiver for disabled daughter. Pt still drives.                               Co-evaluation PT/OT/SLP Co-Evaluation/Treatment: Yes Reason for Co-Treatment: To address functional/ADL transfers   OT goals addressed during session: ADL's and self-care;Proper use of Adaptive equipment and DME;Strengthening/ROM      AM-PAC PT "6 Clicks" Daily Activity     Outcome  Measure Help from another person eating meals?: None Help from another person taking care of personal grooming?: None Help from another person toileting, which includes using toliet, bedpan, or urinal?: Total Help from another person bathing (including washing, rinsing, drying)?: A Lot Help from another person to put on and taking off  regular upper body clothing?: None Help from another person to put on and taking off regular lower body clothing?: Total 6 Click Score: 16   End of Session Equipment Utilized During Treatment: Gait belt;Rolling walker CPM Left Knee CPM Left Knee: Off  Activity Tolerance: Patient tolerated treatment well Patient left: in chair;with call bell/phone within reach  OT Visit Diagnosis: Muscle weakness (generalized) (M62.81)                Time: 6962-9528 OT Time Calculation (min): 35 min Charges:  OT General Charges $OT Visit: 1 Visit OT Evaluation $OT Eval Low Complexity: 1 Low G-Codes: OT G-codes **NOT FOR INPATIENT CLASS** Functional Assessment Tool Used: AM-PAC 6 Clicks Daily Activity Functional Limitation: Self care Self Care Current Status (U1324): At least 40 percent but less than 60 percent impaired, limited or restricted Self Care Goal Status (M0102): At least 40 percent but less than 60 percent impaired, limited or restricted Self Care Discharge Status [phone]): At least 40 percent but less than 60 percent impaired, limited or restricted   Ailene Ravel, OTR/L,CBIS  [phone]   [place], Clarene Duke 09/25/2017, 9:44 AM

## 2017-09-25 NOTE — Plan of Care (Signed)
Pt is progressing 

## 2017-09-25 NOTE — Clinical Social Work Placement (Signed)
   CLINICAL SOCIAL WORK PLACEMENT  NOTE  Date:  09/25/2017  Patient Details  Name: SADEE OSLAND MRN: [id number] Date of Birth: 1961/03/02  Clinical Social Work is seeking post-discharge placement for this patient at the [place] level of care (*CSW will initial, date and re-position this form in  chart as items are completed):  Yes   Patient/family provided with [place] Work Department's list of facilities offering this level of care within the geographic area requested by the patient (or if unable, by the patient's family).  Yes   Patient/family informed of their freedom to choose among providers that offer the needed level of care, that participate in Medicare, Medicaid or managed care program needed by the patient, have an available bed and are willing to accept the patient.  Yes   Patient/family informed of [facility]'s ownership interest in [facility] and [facility], as well as of the fact that they are under no obligation to receive care at these facilities.  PASRR submitted to EDS on 09/25/17     PASRR number received on 09/25/17     Existing PASRR number confirmed on       FL2 transmitted to all facilities in geographic area requested by pt/family on 09/25/17     FL2 transmitted to all facilities within larger geographic area on       Patient informed that his/her managed care company has contracts with or will negotiate with certain facilities, including the following:        Yes   Patient/family informed of bed offers received.  Patient chooses bed at [facility]     Physician recommends and patient chooses bed at      Patient to be transferred to [facility] on  .  Patient to be transferred to facility by       Patient family notified on   of transfer.  Name of family member notified:        PHYSICIAN       Additional Comment:    _______________________________________________ Ihor Gully, LCSW 09/25/2017, 4:17 PM

## 2017-09-25 NOTE — Plan of Care (Signed)
  Acute Rehab PT Goals(only PT should resolve) Pt Will Go Supine/Side To Sit 09/25/2017 1203 - Progressing by Lonell Grandchild, PT Flowsheets Taken 09/25/2017 1203  Pt will go Supine/Side to Sit with modified independence Pt Will Go Sit To Supine/Side 09/25/2017 1203 - Progressing by Lonell Grandchild, PT Flowsheets Taken 09/25/2017 1203  Pt will go Sit to Supine/Side with modified independence Patient Will Transfer Sit To/From Stand 09/25/2017 1203 - Progressing by Lonell Grandchild, PT Flowsheets Taken 09/25/2017 1203  Patient will transfer sit to/from stand with modified independence Pt Will Transfer Bed To Chair/Chair To Bed 09/25/2017 1203 - Progressing by Lonell Grandchild, PT Flowsheets Taken 09/25/2017 1203  Pt will Transfer Bed to Chair/Chair to Bed with supervision Pt Will Ambulate 09/25/2017 1203 - Progressing by Lonell Grandchild, PT Flowsheets Taken 09/25/2017 1203  Pt will Ambulate 50 feet;with supervision;with rolling walker  12:04 PM, 09/25/17 Lonell Grandchild, MPT Physical Therapist with [facility] 336 [phone] office [phone] mobile phone

## 2017-09-25 NOTE — Progress Notes (Signed)
Patient ID: Rhonda Patton, female   DOB: [birth year], 58 y.o.   MRN: [id number] Orthopedic progress note  Postop day 1 status post left total knee  Patient complains of pain.  She says she is hiding the pain because she does not like medication.  She did have some nausea which seems to be improving with appropriate medications    BP (!) 132/59 (BP Location: Right Arm)   Pulse 60   Temp 98.3 F (36.8 C) (Oral)   Resp 18   Ht 5\' 3"  (1.6 m)   Wt 245 lb (111.1 kg)   SpO2 98%   BMI 43.40 kg/m  CBC Latest Ref Rng & Units 09/25/2017 09/22/2017 08/20/2017  WBC 4.0 - 10.5 K/uL 7.2 4.9 4.9  Hemoglobin 12.0 - 15.0 g/dL 11.7(L) 13.8 13.0  Hematocrit 36.0 - 46.0 % 36.7 42.1 39.6  Platelets 150 - 400 K/uL 176 191 188   BMP Latest Ref Rng & Units 09/25/2017 09/22/2017 08/20/2017  Glucose 65 - 99 mg/dL 123(H) 92 94  BUN 6 - 20 mg/dL 9 15 10   Creatinine 0.44 - 1.00 mg/dL 0.87 1.07(H) 0.91  Sodium 135 - 145 mmol/L 138 140 137  Potassium 3.5 - 5.1 mmol/L 3.7 4.2 3.9  Chloride 101 - 111 mmol/L 106 105 101  CO2 22 - 32 mmol/L 26 26 26   Calcium 8.9 - 10.3 mg/dL 8.4(L) 9.9 9.3    She was awake alert and oriented.  Neurovascular exam was intact.  I will monitor that glucose.  I will change her medication a little bit.  She was in a CPM yesterday right out of surgery due to extensive scarring found at the time of surgery  She can participate in regular PT protocol  Foley should come out today.

## 2017-09-25 NOTE — Anesthesia Postprocedure Evaluation (Signed)
Anesthesia Post Note  Patient: Rhonda Patton  Procedure(s) Performed: TOTAL KNEE ARTHROPLASTY (Left Knee)  Patient location during evaluation: Nursing Unit Anesthesia Type: Spinal and General Level of consciousness: awake and alert Pain management: satisfactory to patient Vital Signs Assessment: post-procedure vital signs reviewed and stable Respiratory status: spontaneous breathing Cardiovascular status: stable Postop Assessment: adequate PO intake (nausea resolving with medication) Anesthetic complications: no     Last Vitals:  Vitals:   09/25/17 0900 09/25/17 0916  BP: (!) 111/52   Pulse: 73   Resp: 16   Temp:  36.8 C  SpO2: 96%     Last Pain:  Vitals:   09/25/17 0916  TempSrc: Oral  PainSc:                  Drucie Opitz

## 2017-09-25 NOTE — NC FL2 (Signed)
[place] MEDICAID FL2 LEVEL OF CARE SCREENING TOOL     IDENTIFICATION  Patient Name: Rhonda Patton Birthdate: 11-04-1960 Sex: female Admission Date (Current Location): 09/24/2017  [facility] and [place] Number:  [employer] and Address:  [facility] [address] [place]      Provider Number: [id number]  Attending Physician Name and Address:  Carole Civil, MD  Relative Name and Phone Number:       Current Level of Care: Hospital Recommended Level of Care: [place] Prior Approval Number:    Date Approved/Denied:   PASRR Number: [id number]  Discharge Plan: SNF    Current Diagnoses: Patient Active Problem List   Diagnosis Date Noted  . S/P knee replacement 09/24/2017  . Rectal polyp   . Gastritis due to nonsteroidal anti-inflammatory drug   . Stricture and stenosis of esophagus   . GERD (gastroesophageal reflux disease) 07/28/2017  . Rectal bleeding 07/28/2017  . Dysphagia 07/28/2017  . Abdominal pain, epigastric 07/28/2017  . Derangement of unspecified lateral meniscus due to old tear or injury, left knee   . Trigger finger, acquired   . OA (osteoarthritis) of knee 10/07/2012  . Left leg weakness 03/17/2012  . Difficulty in walking(719.7) 03/17/2012  . Knee stiffness, left 03/17/2012  . S/P arthroscopy of left knee 05/07/17 03/10/2012  . Complex tear of medial meniscus of left knee 03/03/2012  . CARPAL TUNNEL SYNDROME 05/01/2010  . MITRAL REGURGITATION 07/20/2009  . AORTIC INSUFFICIENCY 07/20/2009  . SHORTNESS OF BREATH 06/20/2009  . CHEST PAIN-UNSPECIFIED 05/31/2009    Orientation RESPIRATION BLADDER Height & Weight     Self, Time, Situation, Place  Normal Continent Weight: 245 lb (111.1 kg) Height:  5\' 3"  (160 cm)  BEHAVIORAL SYMPTOMS/MOOD NEUROLOGICAL BOWEL NUTRITION STATUS      Continent Diet(Regular)  AMBULATORY STATUS COMMUNICATION OF NEEDS Skin   Limited Assist   Surgical wounds(Left  knee )                       Personal Care Assistance Level of Assistance  Bathing, Feeding, Dressing Bathing Assistance: Limited assistance Feeding assistance: Independent Dressing Assistance: Limited assistance     Functional Limitations Info             SPECIAL CARE FACTORS FREQUENCY  PT (By licensed PT)     PT Frequency: 5x/week              Contractures Contractures Info: Not present    Additional Factors Info  Code Status, Allergies Code Status Info: Full Code Allergies Info: Codeine, Aspirin           Current Medications (09/25/2017):  This is the current hospital active medication list Current Facility-Administered Medications  Medication Dose Route Frequency Provider Last Rate Last Dose  . acetaminophen (TYLENOL) tablet 650 mg  650 mg Oral Q4H PRN Carole Civil, MD       Or  . acetaminophen (TYLENOL) suppository 650 mg  650 mg Rectal Q4H PRN Carole Civil, MD      . alum & mag hydroxide-simeth (MAALOX/MYLANTA) 200-200-20 MG/5ML suspension 30 mL  30 mL Oral Q4H PRN Carole Civil, MD      . apixaban Arne [place]) tablet 2.5 mg  2.5 mg Oral Q12H Carole Civil, MD   2.5 mg at 09/25/17 0955  . bisacodyl (DULCOLAX) EC tablet 5 mg  5 mg Oral Daily PRN Carole Civil, MD      .  diphenhydrAMINE (BENADRYL) 12.5 MG/5ML elixir 12.5-25 mg  12.5-25 mg Oral Q4H PRN Carole Civil, MD      . docusate sodium (COLACE) capsule 100 mg  100 mg Oral BID Carole Civil, MD   100 mg at 09/25/17 0954  . gabapentin (NEURONTIN) capsule 300 mg  300 mg Oral TID Carole Civil, MD   300 mg at 09/25/17 0956  . hydrochlorothiazide (MICROZIDE) capsule 12.5 mg  12.5 mg Oral Daily Carole Civil, MD   12.5 mg at 09/25/17 0953  . HYDROcodone-acetaminophen (NORCO) 10-325 MG per tablet 1 tablet  1 tablet Oral Q4H Carole Civil, MD   1 tablet at 09/25/17 1337  . HYDROmorphone (DILAUDID) injection 0.5 mg  0.5 mg Intravenous Q2H PRN Carole Civil, MD   0.5 mg at 09/24/17 1525  . magnesium citrate solution 1 Bottle  1 Bottle Oral Once PRN Carole Civil, MD      . menthol-cetylpyridinium (CEPACOL) lozenge 3 mg  1 lozenge Oral PRN Carole Civil, MD       Or  . phenol (CHLORASEPTIC) mouth spray 1 spray  1 spray Mouth/Throat PRN Carole Civil, MD      . methocarbamol (ROBAXIN) tablet 500 mg  500 mg Oral Q6H PRN Carole Civil, MD       Or  . methocarbamol (ROBAXIN) 500 mg in dextrose 5 % 50 mL IVPB  500 mg Intravenous Q6H PRN Carole Civil, MD      . metoCLOPramide (REGLAN) tablet 5-10 mg  5-10 mg Oral Q8H PRN Carole Civil, MD       Or  . metoCLOPramide (REGLAN) injection 5-10 mg  5-10 mg Intravenous Q8H PRN Carole Civil, MD      . ondansetron [facility]) tablet 4 mg  4 mg Oral Q6H PRN Carole Civil, MD       Or  . ondansetron [facility]) injection 4 mg  4 mg Intravenous Q6H PRN Carole Civil, MD   4 mg at 09/25/17 1016  . oxyCODONE (Oxy IR/ROXICODONE) immediate release tablet 10 mg  10 mg Oral Q3H PRN Carole Civil, MD   10 mg at 09/24/17 1632  . pantoprazole (PROTONIX) EC tablet 40 mg  40 mg Oral Daily Carole Civil, MD   40 mg at 09/25/17 0954  . senna-docusate (Senokot-S) tablet 1 tablet  1 tablet Oral QHS PRN Carole Civil, MD         Discharge Medications: Please see discharge summary for a list of discharge medications.  Relevant Imaging Results:  Relevant Lab Results:   Additional Information SSN [id number], Clydene Pugh, LCSW

## 2017-09-25 NOTE — Progress Notes (Signed)
PT Cancellation Note  Patient Details Name: Rhonda Patton MRN: [id number] DOB: 1961-09-05   Cancelled Treatment:    Reason Eval/Treat Not Completed: Fatigue/lethargy limiting ability to participate.  Declined BID treatment.   Ramond Dial 09/25/2017, 5:20 PM   Mee Hives, PT MS Acute Rehab Dept. Number: [place] and [place]

## 2017-09-26 LAB — CBC
HCT: 35.9 % — ABNORMAL LOW (ref 36.0–46.0)
HEMOGLOBIN: 11.6 g/dL — AB (ref 12.0–15.0)
MCH: 28.4 pg (ref 26.0–34.0)
MCHC: 32.3 g/dL (ref 30.0–36.0)
MCV: 88 fL (ref 78.0–100.0)
PLATELETS: 173 10*3/uL (ref 150–400)
RBC: 4.08 MIL/uL (ref 3.87–5.11)
RDW: 14.1 % (ref 11.5–15.5)
WBC: 9.7 10*3/uL (ref 4.0–10.5)

## 2017-09-26 MED ORDER — HYDROCODONE-ACETAMINOPHEN 10-325 MG PO TABS
1.0000 | ORAL_TABLET | ORAL | Status: DC
  Administered 2017-09-26 – 2017-09-29 (×17): 1 via ORAL
  Filled 2017-09-26 (×21): qty 1

## 2017-09-26 NOTE — Progress Notes (Signed)
Physical Therapy Treatment Patient Details Name: Rhonda Patton MRN: [id number] DOB: [birth year] Today's Date: 09/26/2017    History of Present Illness Rhonda Patton is a 56 y/o female Post-op left TKA. completed on 09/24/17 with the diagnosis of PRIMARY OSTEOARTHRITIS KNEE      PT Comments    Pt was able to assist with short walk to [facility] but complaining of pain in L knee since AM due to MD changing bandage and pulling drain.  Her plan is to continue acutely and progress gait and strength/ROM to L k nee as tolerated.  Pt is motivated but very uncomfortable.  See tomorrow as pt can allow.   Follow Up Recommendations  SNF;Supervision/Assistance - 24 hour     Equipment Recommendations  Rolling walker with 5" wheels    Recommendations for Other Services       Precautions / Restrictions Precautions Precautions: Fall Precaution Comments: due to recent knee surgery Restrictions Weight Bearing Restrictions: Yes LLE Weight Bearing: Weight bearing as tolerated    Mobility  Bed Mobility Overal bed mobility: Needs Assistance Bed Mobility: Supine to Sit;Sit to Supine     Supine to sit: Min assist Sit to supine: Min assist   General bed mobility comments: reminded to push off bed, pt able to assist to stand  Transfers Overall transfer level: Needs assistance Equipment used: Rolling walker (2 wheeled) Transfers: Sit to/from Stand Sit to Stand: Min assist         General transfer comment: cues for hand placment  Ambulation/Gait Ambulation/Gait assistance: Min guard Ambulation Distance (Feet): 8 Feet Assistive device: Rolling walker (2 wheeled) Gait Pattern/deviations: Step-to pattern;Decreased stride length;Trunk flexed;Wide base of support;Decreased weight shift to left Gait velocity: reduced Gait velocity interpretation: Below normal speed for age/gender     Stairs            Wheelchair Mobility    Modified Rankin (Stroke Patients Only)        Balance Overall balance assessment: Needs assistance Sitting-balance support: Feet supported Sitting balance-Leahy Scale: Good     Standing balance support: Bilateral upper extremity supported;During functional activity Standing balance-Leahy Scale: Fair Standing balance comment: less than fair dynamic balance, steadies on walker                            Cognition Arousal/Alertness: Awake/alert Behavior During Therapy: WFL for tasks assessed/performed Overall Cognitive Status: Within Functional Limits for tasks assessed                                        Exercises Total Joint Exercises Ankle Circles/Pumps: AROM;Both;5 reps Quad Sets: AROM;Both;10 reps Gluteal Sets: AROM;Both;10 reps Heel Slides: AAROM;Left;10 reps Goniometric ROM: L knee 68 deg    General Comments        Pertinent Vitals/Pain Pain Assessment: Faces Faces Pain Scale: Hurts even more Pain Location: Left knee Pain Descriptors / Indicators: Operative site guarding;Grimacing Pain Intervention(s): Limited activity within patient's tolerance;Monitored during session;Premedicated before session;Repositioned    Home Living                      Prior Function            PT Goals (current goals can now be found in the care plan section) Acute Rehab PT Goals Patient Stated Goal: to get home Progress towards PT goals: Progressing toward  goals    Frequency    7X/week      PT Plan Current plan remains appropriate    Co-evaluation              AM-PAC PT "6 Clicks" Daily Activity  Outcome Measure  Difficulty turning over in bed (including adjusting bedclothes, sheets and blankets)?: A Lot Difficulty moving from lying on back to sitting on the side of the bed? : Unable Difficulty sitting down on and standing up from a chair with arms (e.g., wheelchair, bedside commode, etc,.)?: Unable Help needed moving to and from a bed to chair (including a  wheelchair)?: A Little Help needed walking in hospital room?: A Little Help needed climbing 3-5 steps with a railing? : Total 6 Click Score: 11    End of Session Equipment Utilized During Treatment: Gait belt Activity Tolerance: Patient limited by fatigue;Patient limited by pain Patient left: in bed;with call bell/phone within reach;with bed alarm set;with family/visitor present Nurse Communication: Mobility status PT Visit Diagnosis: Unsteadiness on feet (R26.81);Muscle weakness (generalized) (M62.81);Pain Pain - Right/Left: Left Pain - part of body: Knee     Time: 1343-1406 PT Time Calculation (min) (ACUTE ONLY): 23 min  Charges:  $Gait Training: 8-22 mins $Therapeutic Exercise: 8-22 mins                    G Codes:  Functional Assessment Tool Used: AM-PAC 6 Clicks Basic Mobility     Ramond Dial 09/26/2017, 2:17 PM   Mee Hives, PT MS Acute Rehab Dept. Number: [place] and [place]

## 2017-09-26 NOTE — Plan of Care (Signed)
Pt is progressing 

## 2017-09-26 NOTE — Progress Notes (Signed)
Patient ID: Rhonda Patton, female   DOB: Mar 06, 1961, 56 y.o.   MRN: [id number]   BP 133/69   Pulse 73   Temp 99 F (37.2 C) (Oral)   Resp 20   Ht 5\' 3"  (1.6 m)   Wt 245 lb (111.1 kg)   SpO2 99%   BMI 43.40 kg/m   CBC Latest Ref Rng & Units 09/26/2017 09/25/2017 09/22/2017  WBC 4.0 - 10.5 K/uL 9.7 7.2 4.9  Hemoglobin 12.0 - 15.0 g/dL 11.6(L) 11.7(L) 13.8  Hematocrit 36.0 - 46.0 % 35.9(L) 36.7 42.1  Platelets 150 - 400 K/uL 173 176 191   BMP Latest Ref Rng & Units 09/25/2017 09/22/2017 08/20/2017  Glucose 65 - 99 mg/dL 123(H) 92 94  BUN 6 - 20 mg/dL 9 15 10   Creatinine 0.44 - 1.00 mg/dL 0.87 1.07(H) 0.91  Sodium 135 - 145 mmol/L 138 140 137  Potassium 3.5 - 5.1 mmol/L 3.7 4.2 3.9  Chloride 101 - 111 mmol/L 106 105 101  CO2 22 - 32 mmol/L 26 26 26   Calcium 8.9 - 10.3 mg/dL 8.4(L) 9.9 9.3    Removed drain   Continue PT

## 2017-09-27 LAB — CBC
HCT: 35 % — ABNORMAL LOW (ref 36.0–46.0)
Hemoglobin: 11.4 g/dL — ABNORMAL LOW (ref 12.0–15.0)
MCH: 28.7 pg (ref 26.0–34.0)
MCHC: 32.6 g/dL (ref 30.0–36.0)
MCV: 88.2 fL (ref 78.0–100.0)
PLATELETS: 169 10*3/uL (ref 150–400)
RBC: 3.97 MIL/uL (ref 3.87–5.11)
RDW: 14.1 % (ref 11.5–15.5)
WBC: 9 10*3/uL (ref 4.0–10.5)

## 2017-09-27 NOTE — Progress Notes (Signed)
Physical Therapy Treatment Patient Details Name: Rhonda Patton MRN: [id number] DOB: 09-08-1961 Today's Date: 09/27/2017    History of Present Illness Rhonda Patton is a 56 y/o female Post-op left TKA. completed on 09/24/17 with the diagnosis of PRIMARY OSTEOARTHRITIS KNEE      PT Comments    Patient demonstrates increased endurance for gait training with fair/good return for left heel to toe stepping and tolerated sitting up in chair with LLE dangling after therapy.  Patient will benefit from continued physical therapy in hospital and recommended venue below to increase strength, balance, endurance for safe ADLs and gait.    Follow Up Recommendations  SNF;Supervision/Assistance - 24 hour     Equipment Recommendations  Rolling walker with 5" wheels    Recommendations for Other Services       Precautions / Restrictions Precautions Precautions: Fall Precaution Comments: due to recent knee surgery Restrictions Weight Bearing Restrictions: Yes LLE Weight Bearing: Weight bearing as tolerated    Mobility  Bed Mobility Overal bed mobility: Needs Assistance Bed Mobility: Sit to Supine     Supine to sit: Min assist     General bed mobility comments: required assistance to move LLE to bedside  Transfers Overall transfer level: Needs assistance Equipment used: Rolling walker (2 wheeled) Transfers: Sit to/from [employer] Sit to Stand: Min guard Stand pivot transfers: Min guard       General transfer comment: requires less assistance  Ambulation/Gait Ambulation/Gait assistance: Min guard Ambulation Distance (Feet): 25 Feet Assistive device: Rolling walker (2 wheeled) Gait Pattern/deviations: Decreased step length - left;Decreased stance time - left;Decreased stride length   Gait velocity interpretation: Below normal speed for age/gender General Gait Details: demonstrates slightly labored slow cadence with fair/good return for left heel to toe  stepping, no loss of balance, limited secondary to c/o fatigue, left knee pain   Stairs            Wheelchair Mobility    Modified Rankin (Stroke Patients Only)       Balance Overall balance assessment: Needs assistance Sitting-balance support: No upper extremity supported;Feet supported Sitting balance-Leahy Scale: Good     Standing balance support: Bilateral upper extremity supported;During functional activity Standing balance-Leahy Scale: Fair Standing balance comment: fair/good with RW                            Cognition Arousal/Alertness: Awake/alert Behavior During Therapy: WFL for tasks assessed/performed Overall Cognitive Status: Within Functional Limits for tasks assessed                                        Exercises Total Joint Exercises Ankle Circles/Pumps: Supine;AROM;Left;10 reps;Strengthening Quad Sets: Supine;AROM;Strengthening;Left;10 reps Short Arc Quad: Supine;AAROM;Strengthening;Left;10 reps Heel Slides: Supine;AROM;Strengthening;Left;10 reps Goniometric ROM: LEFT KNEE: 0-65 degrees    General Comments        Pertinent Vitals/Pain Pain Assessment: 0-10 Pain Score: 8  Pain Location: left knee Pain Descriptors / Indicators: Aching;Discomfort;Grimacing    Home Living                      Prior Function            PT Goals (current goals can now be found in the care plan section) Acute Rehab PT Goals Patient Stated Goal: to get home PT Goal Formulation: With patient Time For Goal Achievement:  10/02/17 Potential to Achieve Goals: Good Progress towards PT goals: Progressing toward goals    Frequency    7X/week      PT Plan Current plan remains appropriate    Co-evaluation              AM-PAC PT "6 Clicks" Daily Activity  Outcome Measure  Difficulty turning over in bed (including adjusting bedclothes, sheets and blankets)?: A Little Difficulty moving from lying on back to  sitting on the side of the bed? : A Little Difficulty sitting down on and standing up from a chair with arms (e.g., wheelchair, bedside commode, etc,.)?: A Little Help needed moving to and from a bed to chair (including a wheelchair)?: A Little Help needed walking in hospital room?: A Little Help needed climbing 3-5 steps with a railing? : A Lot 6 Click Score: 17    End of Session   Activity Tolerance: Patient tolerated treatment well;Patient limited by pain;Patient limited by fatigue Patient left: in chair;with call bell/phone within reach Nurse Communication: Mobility status PT Visit Diagnosis: Unsteadiness on feet (R26.81);Other abnormalities of gait and mobility (R26.89);Muscle weakness (generalized) (M62.81) Pain - Right/Left: Left Pain - part of body: Knee     Time: 7619-5093 PT Time Calculation (min) (ACUTE ONLY): 27 min  Charges:  $Gait Training: 8-22 mins $Therapeutic Exercise: 8-22 mins                    G Codes:       12:37 PM, Oct 21, 2017 Lonell Grandchild, MPT Physical Therapist with [facility] [phone] office [phone] mobile phone

## 2017-09-27 NOTE — Progress Notes (Signed)
Patient ID: Rhonda Patton, female   DOB: July 06, 1961, 56 y.o.   MRN: [id number] POD 3 S/P LEFT TKA  BP (!) 108/53 (BP Location: Right Leg)   Pulse 81   Temp 98.8 F (37.1 C) (Oral)   Resp 20   Ht 5\' 3"  (1.6 m)   Wt 245 lb (111.1 kg)   SpO2 96%   BMI 43.40 kg/m   CHART CHECK (SNOW STORM)  LABS LOOK FINE VITALS ARE GOOD   CONTINUE CPM  PAIN CONTROL

## 2017-09-28 NOTE — Clinical Social Work Note (Signed)
LCSW provided bed offer from [facility]. Patient stated that she had made the decision to go home at discharge.   LCSW signing off.      Rhonda Patton, Clydene Pugh, LCSW

## 2017-09-28 NOTE — Progress Notes (Signed)
Physical Therapy Treatment Patient Details Name: Rhonda Patton MRN: [id number] DOB: Apr 08, 1961 Today's Date: 09/28/2017    History of Present Illness Rhonda Patton is a 56 y/o female Post-op left TKA. completed on 09/24/17 with the diagnosis of PRIMARY OSTEOARTHRITIS KNEE      PT Comments    Patient requires less assistance for moving LLE when sitting up at beside, increased endurance for gait training, and increased left knee ROM to 0-78 degrees.  Patient will benefit from continued physical therapy in hospital and recommended venue below to increase strength, balance, endurance for safe ADLs and gait.   Follow Up Recommendations  Home health PT;Supervision for mobility/OOB     Equipment Recommendations  Rolling walker with 5" wheels    Recommendations for Other Services       Precautions / Restrictions Precautions Precautions: Fall Precaution Comments: due to recent knee surgery Restrictions Weight Bearing Restrictions: Yes LLE Weight Bearing: Weight bearing as tolerated    Mobility  Bed Mobility Overal bed mobility: Needs Assistance Bed Mobility: Sit to Supine     Supine to sit: Supervision     General bed mobility comments: able to move LLE without assistance  Transfers Overall transfer level: Needs assistance Equipment used: Rolling walker (2 wheeled) Transfers: Sit to/from [employer] Transfers Sit to Stand: Supervision Stand pivot transfers: Supervision       General transfer comment: increased LLE strength for sit to stands/transfers  Ambulation/Gait Ambulation/Gait assistance: Supervision Ambulation Distance (Feet): 40 Feet Assistive device: Rolling walker (2 wheeled) Gait Pattern/deviations: Decreased step length - left;Decreased stance time - left;Decreased stride length   Gait velocity interpretation: Below normal speed for age/gender General Gait Details: demonstrates slightly labored slow cadence with fair/good return for left  heel to toe stepping, no loss of balance, limited secondary to c/o fatigue   Stairs            Wheelchair Mobility    Modified Rankin (Stroke Patients Only)       Balance Overall balance assessment: Needs assistance Sitting-balance support: No upper extremity supported;Feet supported Sitting balance-Leahy Scale: Good     Standing balance support: Bilateral upper extremity supported;During functional activity Standing balance-Leahy Scale: Fair Standing balance comment: fair/good with RW                            Cognition Arousal/Alertness: Awake/alert Behavior During Therapy: WFL for tasks assessed/performed Overall Cognitive Status: Within Functional Limits for tasks assessed                                        Exercises Total Joint Exercises Ankle Circles/Pumps: Supine;AROM;Left;10 reps;Strengthening Quad Sets: Supine;AROM;Strengthening;Left;10 reps Short Arc Quad: Supine;AAROM;Strengthening;Left;10 reps Heel Slides: Supine;AROM;Strengthening;Left;10 reps Goniometric ROM: LET KNEE PROM: 0-78 degrees    General Comments        Pertinent Vitals/Pain Pain Assessment: 0-10 Pain Score: 7  Pain Location: left knee Pain Descriptors / Indicators: Aching;Discomfort Pain Intervention(s): Limited activity within patient's tolerance;Monitored during session    Home Living                      Prior Function            PT Goals (current goals can now be found in the care plan section) Acute Rehab PT Goals Patient Stated Goal: return home  PT Goal Formulation: With patient  Time For Goal Achievement: 10/02/17 Potential to Achieve Goals: Good Progress towards PT goals: Progressing toward goals    Frequency    7X/week      PT Plan Current plan remains appropriate    Co-evaluation              AM-PAC PT "6 Clicks" Daily Activity  Outcome Measure  Difficulty turning over in bed (including adjusting  bedclothes, sheets and blankets)?: None Difficulty moving from lying on back to sitting on the side of the bed? : None Difficulty sitting down on and standing up from a chair with arms (e.g., wheelchair, bedside commode, etc,.)?: A Little Help needed moving to and from a bed to chair (including a wheelchair)?: A Little Help needed walking in hospital room?: A Little Help needed climbing 3-5 steps with a railing? : A Little 6 Click Score: 20    End of Session   Activity Tolerance: Patient tolerated treatment well;Patient limited by fatigue Patient left: in chair;with call bell/phone within reach Nurse Communication: Mobility status PT Visit Diagnosis: Unsteadiness on feet (R26.81);Other abnormalities of gait and mobility (R26.89);Muscle weakness (generalized) (M62.81) Pain - Right/Left: Left Pain - part of body: Knee     Time: 5053-9767 PT Time Calculation (min) (ACUTE ONLY): 28 min  Charges:  $Gait Training: 8-22 mins $Therapeutic Exercise: 8-22 mins                    G Codes:       1:50 PM, 2017-10-28 Lonell Grandchild, MPT Physical Therapist with [facility] [phone] office [phone] mobile phone

## 2017-09-28 NOTE — Progress Notes (Signed)
Patient ID: Rhonda Patton, female   DOB: [birth year], 56 y.o.   MRN: [id number]   BP (!) 112/58 (BP Location: Right Arm)   Pulse 76   Temp 98.6 F (37 C) (Oral)   Resp 18   Ht 5\' 3"  (1.6 m)   Wt 245 lb (111.1 kg)   SpO2 98%   BMI 43.40 kg/m   C/o sore thigh   C/o knee pain   PT concerned over swelling and knee being warm  No ice for 24 hours, cryocuff left off!  Calf soft and supple   Wound clean no erythema  No issue that I can see;  She cant get in the house so DC tomorrow   Reapply ice AND the TED hose was left off, not sure why.

## 2017-09-28 NOTE — Progress Notes (Signed)
Thigh length TED hose placed on left leg. Cryo-cuff placed onto left knee twice during day shift for 30-45 min intervals. Pt sat up in chair approximately 6 hours before heading back to bed.

## 2017-09-29 MED ORDER — APIXABAN 2.5 MG PO TABS: 2.5000 mg | ORAL_TABLET | Freq: Two times a day (BID) | ORAL | 0 refills | Status: DC

## 2017-09-29 MED ORDER — HYDROCODONE-ACETAMINOPHEN 10-325 MG PO TABS: 1.0000 | ORAL_TABLET | ORAL | 0 refills | Status: DC

## 2017-09-29 MED ORDER — BISACODYL 5 MG PO TBEC: 5.0000 mg | DELAYED_RELEASE_TABLET | Freq: Every day | ORAL | 0 refills | Status: DC | PRN

## 2017-09-29 NOTE — Care Management Note (Signed)
Case Management Note  Patient Details  Name: Rhonda Patton MRN: [id number] Date of Birth: Sep 14, 1961     Expected Discharge Date:  09/29/17               Expected Discharge Plan:  [facility] SNF)  In-House Referral:  Clinical Social Work  Discharge planning Services  CM Consult  Post Acute Care Choice:  Home Health, Durable Medical Equipment Choice offered to:  Patient  DME Arranged:  Walker rolling DME Agency:  [place]:  PT Tool:  [facility]  Status of Service:  Completed, signed off  If discussed at [employer], dates discussed:    Additional Comments: Patient discharging home today with [facility] PT provided by [facility]. Tim of [facility] aware of Discharge and has [place] orders. She will work with PT here in hospital today and DC later this afternoon. She has RW in room to take home. She will have family support at home. She has been in contact with [employer] and they will deliver CPM to her home. No other CM needs.  Rheanna Sergent, Chauncey Reading, RN 09/29/2017, 9:48 AM

## 2017-09-29 NOTE — Discharge Summary (Signed)
Physician Discharge Summary  Patient ID: Rhonda Patton MRN: [id number] DOB/AGE: 58-Nov-1962 58 y.o.  Admit date: 09/24/2017 Discharge date: 09/29/2017  Admission Diagnoses: OA LEFT KNEE  Discharge Diagnoses: SAME Active Problems:   S/P knee replacement   Discharged Condition: good  Hospital Course:  HD 1 LEFT TKA  HD 2-4 PT, HAD SOME TROUBLE CONTROLLING PAIN AND ADVANCING IN PT. BUT WAS ABLE TO GAIN 0-78 AND 40 FT GAIT  HD 5 STABLE FOR DC  NOTE STAYED IN HOUSE 2 EXTRA DAYS DUE TO THE WEATHER  CBC Latest Ref Rng & Units 09/27/2017 09/26/2017 09/25/2017  WBC 4.0 - 10.5 K/uL 9.0 9.7 7.2  Hemoglobin 12.0 - 15.0 g/dL 11.4(L) 11.6(L) 11.7(L)  Hematocrit 36.0 - 46.0 % 35.0(L) 35.9(L) 36.7  Platelets 150 - 400 K/uL 169 173 176   BMP Latest Ref Rng & Units 09/25/2017 09/22/2017 08/20/2017  Glucose 65 - 99 mg/dL 123(H) 92 94  BUN 6 - 20 mg/dL 9 15 10   Creatinine 0.44 - 1.00 mg/dL 0.87 1.07(H) 0.91  Sodium 135 - 145 mmol/L 138 140 137  Potassium 3.5 - 5.1 mmol/L 3.7 4.2 3.9  Chloride 101 - 111 mmol/L 106 105 101  CO2 22 - 32 mmol/L 26 26 26   Calcium 8.9 - 10.3 mg/dL 8.4(L) 9.9 9.3   Discharge Exam: Blood pressure 125/90, pulse 80, temperature 98.6 F (37 C), temperature source Oral, resp. rate 18, height 5\' 3"  (1.6 m), weight 245 lb (111.1 kg), SpO2 100 %. AAAOX3 NEUROVASCULAR EXAM NORMAL   Disposition: 01-Home or Self Care  Discharge Instructions    CPM   Complete by:  As directed    Continuous passive motion machine (CPM):      Use the CPM from 0 to 75 for 8 hours per day.      You may increase by 10 per day.  You may break it up into 2 or 3 sessions per day.      Use CPM for 2 weeks or until you are told to stop.   Call MD / Call 911   Complete by:  As directed    If you experience chest pain or shortness of breath, CALL 911 and be transported to the hospital emergency room.  If you develope a fever above 101 F, pus (white drainage) or increased drainage or redness at the  wound, or calf pain, call your surgeon's office.   Change dressing   Complete by:  As directed    Do not change dressing   Constipation Prevention   Complete by:  As directed    Drink plenty of fluids.  Prune juice may be helpful.  You may use a stool softener, such as Colace (over the counter) 100 mg twice a day.  Use MiraLax (over the counter) for constipation as needed.   Diet - low sodium heart healthy   Complete by:  As directed    Increase activity slowly as tolerated   Complete by:  As directed    TED hose   Complete by:  As directed    Use stockings (TED hose) for 2 weeks   You may remove them at night for sleeping.     Allergies as of 09/29/2017      Reactions   Codeine Shortness Of Breath, Nausea And Vomiting   Aspirin Other (See Comments)   REACTION: stomach upset      Medication List    STOP taking these medications   acetaminophen 500 MG tablet Commonly known as:  TYLENOL   HYDROcodone-acetaminophen 5-325 MG tablet Commonly known as:  NORCO Replaced by:  HYDROcodone-acetaminophen 10-325 MG tablet     TAKE these medications   apixaban 2.5 MG Tabs tablet Commonly known as:  ELIQUIS Take 1 tablet (2.5 mg total) by mouth every 12 (twelve) hours.   bisacodyl 5 MG EC tablet Commonly known as:  DULCOLAX Take 1 tablet (5 mg total) by mouth daily as needed for moderate constipation.   gabapentin 300 MG capsule Commonly known as:  NEURONTIN Take 300 mg by mouth 3 (three) times daily.   hydrochlorothiazide 12.5 MG capsule Commonly known as:  MICROZIDE Take 12.5 mg by mouth daily.   HYDROcodone-acetaminophen 10-325 MG tablet Commonly known as:  NORCO Take 1 tablet by mouth every 3 (three) hours. Replaces:  HYDROcodone-acetaminophen 5-325 MG tablet   methocarbamol 750 MG tablet Commonly known as:  ROBAXIN Take 750 mg by mouth every 6 (six) hours as needed for muscle spasms.   pantoprazole 40 MG tablet Commonly known as:  PROTONIX Take 40 mg by mouth  daily.            Durable Medical Equipment  (From admission, onward)        Start     Ordered   09/25/17 1049  For home use only DME Walker rolling  Once    Question:  Patient needs a walker to treat with the following condition  Answer:  S/P TKR (total knee replacement)   09/25/17 1049       Discharge Care Instructions  (From admission, onward)        Start     Ordered   09/29/17 0000  Change dressing    Comments:  Do not change dressing   09/29/17 0926     Follow-up Information    Home, [facility].   Specialty:  [facility] Contact information: [place] [address] [phone]           Signed: [name] 09/29/2017, 9:26 AM

## 2017-09-29 NOTE — Plan of Care (Signed)
  Acute Rehab PT Goals(only PT should resolve) Pt Will Go Supine/Side To Sit 09/29/2017 1218 - Completed/Met by Lonell Grandchild, PT Pt Will Go Sit To Supine/Side 09/29/2017 1218 - Completed/Met by Lonell Grandchild, PT Patient Will Transfer Sit To/From Stand 09/29/2017 1218 - Completed/Met by Lonell Grandchild, PT Pt Will Transfer Bed To Chair/Chair To Bed 09/29/2017 1218 - Completed/Met by Lonell Grandchild, PT Pt Will Ambulate 09/29/2017 1218 - Completed/Met by Lonell Grandchild, PT  12:19 PM, 09/29/17 Lonell Grandchild, MPT Physical Therapist with [facility] 336 [phone] office [phone] mobile phone

## 2017-09-29 NOTE — Care Management Note (Signed)
Case Management Note  Patient Details  Name: Rhonda Patton MRN: [id number] Date of Birth: 06-Jun-1961   If discussed at [facility] Length of Stay Meetings, dates discussed:   09/29/2017 Additional Comments:  Nichole Keltner, Chauncey Reading, RN 09/29/2017, 12:05 PM

## 2017-09-29 NOTE — Plan of Care (Signed)
Patient has been walking with 1 assist and walker to bathroom. Patient feels comfortable in doing so. Leg does not feel as tight per patient. Pain controlled adequately.

## 2017-09-29 NOTE — Progress Notes (Signed)
Patient's IV removed.  Site WNL.  AVS reviewed with patient. Verbalized understanding of discharge instructions, physician follow-up, medications, incision care. Patient transported by NT via wheelchair to main entrance at discharge.  Patient stable at time of discharge.   

## 2017-09-29 NOTE — Progress Notes (Signed)
Physical Therapy Treatment Patient Details Name: Rhonda Patton MRN: [id number] DOB: 26-Feb-1961 Today's Date: 09/29/2017    History of Present Illness Zeola Kelch is a 56 y/o female Post-op left TKA. completed on 09/24/17 with the diagnosis of PRIMARY OSTEOARTHRITIS KNEE      PT Comments    Patient demonstrates increased endurance for gait training with fair/good return for left heel to toe stepping after verbal cueing, good return for moving LLE during bed mobility and lifting onto bed using RLE.  Patient will benefit from continued physical therapy recommended venue below to increase strength, balance, endurance for safe ADLs and gait. PLAN: patient to discharge home today and discharged from physical therapy to care of nursing.   Follow Up Recommendations  Home health PT;Supervision for mobility/OOB     Equipment Recommendations  Rolling walker with 5" wheels    Recommendations for Other Services       Precautions / Restrictions Precautions Precautions: Fall Precaution Comments: due to recent knee surgery Restrictions Weight Bearing Restrictions: Yes LLE Weight Bearing: Weight bearing as tolerated    Mobility  Bed Mobility Overal bed mobility: Needs Assistance Bed Mobility: Supine to Sit;Sit to Supine     Supine to sit: Supervision Sit to supine: Supervision      Transfers Overall transfer level: Modified independent Equipment used: Rolling walker (2 wheeled) Transfers: Sit to/from [facility] Sit to Stand: Modified independent (Device/Increase time) Stand pivot transfers: Modified independent (Device/Increase time)          Ambulation/Gait Ambulation/Gait assistance: Supervision Ambulation Distance (Feet): 75 Feet Assistive device: Rolling walker (2 wheeled) Gait Pattern/deviations: Decreased step length - left;Decreased stance time - left;Decreased stride length   Gait velocity interpretation: Below normal speed for  age/gender General Gait Details: demonstrates slightly labored slow cadence with fair/good return for left heel to toe stepping after verbal cueing, no loss of balance, limited secondary to c/o fatigue   Stairs            Wheelchair Mobility    Modified Rankin (Stroke Patients Only)       Balance Overall balance assessment: Needs assistance Sitting-balance support: No upper extremity supported;Feet supported Sitting balance-Leahy Scale: Good     Standing balance support: Bilateral upper extremity supported;During functional activity Standing balance-Leahy Scale: Fair Standing balance comment: fair/good with RW                            Cognition Arousal/Alertness: Awake/alert Behavior During Therapy: WFL for tasks assessed/performed Overall Cognitive Status: Within Functional Limits for tasks assessed                                        Exercises Total Joint Exercises Ankle Circles/Pumps: Supine;AROM;Left;10 reps;Strengthening Quad Sets: Supine;AROM;Strengthening;Left;10 reps Short Arc Quad: Supine;AAROM;Strengthening;Left;10 reps Heel Slides: Supine;AROM;Strengthening;Left;10 reps Goniometric ROM: LEFT KNEE PROM 0-72 degrees    General Comments        Pertinent Vitals/Pain Pain Score: 7  Pain Location: left knee Pain Descriptors / Indicators: Aching;Discomfort Pain Intervention(s): Limited activity within patient's tolerance;Monitored during session    Home Living                      Prior Function            PT Goals (current goals can now be found in the care plan section) Acute  Rehab PT Goals Patient Stated Goal: return home  PT Goal Formulation: With patient Time For Goal Achievement: Oct 21, 2017 Potential to Achieve Goals: Good Progress towards PT goals: Progressing toward goals    Frequency    7X/week      PT Plan Current plan remains appropriate    Co-evaluation              AM-PAC PT  "6 Clicks" Daily Activity  Outcome Measure  Difficulty turning over in bed (including adjusting bedclothes, sheets and blankets)?: None Difficulty moving from lying on back to sitting on the side of the bed? : None Difficulty sitting down on and standing up from a chair with arms (e.g., wheelchair, bedside commode, etc,.)?: None Help needed moving to and from a bed to chair (including a wheelchair)?: A Little Help needed walking in hospital room?: A Little Help needed climbing 3-5 steps with a railing? : A Little 6 Click Score: 21    End of Session   Activity Tolerance: Patient tolerated treatment well;Patient limited by fatigue Patient left: in bed;with call bell/phone within reach;with family/visitor present Nurse Communication: Mobility status PT Visit Diagnosis: Unsteadiness on feet (R26.81);Other abnormalities of gait and mobility (R26.89);Muscle weakness (generalized) (M62.81) Pain - Right/Left: Left Pain - part of body: Knee     Time: 5997-7414 PT Time Calculation (min) (ACUTE ONLY): 25 min  Charges:  $Gait Training: 8-22 mins $Therapeutic Exercise: 8-22 mins                    G Codes:       12:18 PM, 10-21-17 Lonell Grandchild, MPT Physical Therapist with [facility] 336 [phone] office [phone] mobile phone

## 2017-09-30 ENCOUNTER — Other Ambulatory Visit: Payer: Self-pay | Admitting: Orthopedic Surgery

## 2017-09-30 ENCOUNTER — Telehealth: Payer: Self-pay | Admitting: Orthopedic Surgery

## 2017-09-30 DIAGNOSIS — R112 Nausea with vomiting, unspecified: Secondary | ICD-10-CM

## 2017-09-30 LAB — TYPE AND SCREEN
ABO/RH(D): A POS
Antibody Screen: NEGATIVE
UNIT DIVISION: 0
Unit division: 0

## 2017-09-30 LAB — BPAM RBC
Blood Product Expiration Date: 201812272359
Blood Product Expiration Date: 201812272359
ISSUE DATE / TIME: 201812121357
ISSUE DATE / TIME: 201812121148
UNIT TYPE AND RH: 6200
Unit Type and Rh: 6200

## 2017-09-30 MED ORDER — PROMETHAZINE HCL 25 MG PO TABS: 25.0000 mg | ORAL_TABLET | Freq: Four times a day (QID) | ORAL | 1 refills | Status: DC | PRN

## 2017-09-30 NOTE — Telephone Encounter (Signed)
Patient called, asking for Dr Aline Brochure to please prescribe medication for nausea; states was discharged from [facility] yesterday, 09/29/17 ,status/post left total knee surgery on 09/24/17.  Pharmacy is [facility]; states no medication allergies. Please advise.

## 2017-09-30 NOTE — Telephone Encounter (Signed)
I called her to advise / 

## 2017-09-30 NOTE — Telephone Encounter (Signed)
DONE

## 2017-10-02 DIAGNOSIS — Z7901 Long term (current) use of anticoagulants: Secondary | ICD-10-CM | POA: Diagnosis not present

## 2017-10-02 DIAGNOSIS — R1310 Dysphagia, unspecified: Secondary | ICD-10-CM | POA: Diagnosis not present

## 2017-10-02 DIAGNOSIS — Z471 Aftercare following joint replacement surgery: Secondary | ICD-10-CM | POA: Diagnosis not present

## 2017-10-02 DIAGNOSIS — I080 Rheumatic disorders of both mitral and aortic valves: Secondary | ICD-10-CM | POA: Diagnosis not present

## 2017-10-02 DIAGNOSIS — G5600 Carpal tunnel syndrome, unspecified upper limb: Secondary | ICD-10-CM | POA: Diagnosis not present

## 2017-10-02 DIAGNOSIS — K219 Gastro-esophageal reflux disease without esophagitis: Secondary | ICD-10-CM | POA: Diagnosis not present

## 2017-10-02 DIAGNOSIS — Z96652 Presence of left artificial knee joint: Secondary | ICD-10-CM | POA: Diagnosis not present

## 2017-10-02 DIAGNOSIS — Z9181 History of falling: Secondary | ICD-10-CM | POA: Diagnosis not present

## 2017-10-05 ENCOUNTER — Telehealth: Payer: Self-pay | Admitting: Orthopedic Surgery

## 2017-10-05 DIAGNOSIS — I080 Rheumatic disorders of both mitral and aortic valves: Secondary | ICD-10-CM | POA: Diagnosis not present

## 2017-10-05 DIAGNOSIS — R1310 Dysphagia, unspecified: Secondary | ICD-10-CM | POA: Diagnosis not present

## 2017-10-05 DIAGNOSIS — Z7901 Long term (current) use of anticoagulants: Secondary | ICD-10-CM | POA: Diagnosis not present

## 2017-10-05 DIAGNOSIS — Z9181 History of falling: Secondary | ICD-10-CM | POA: Diagnosis not present

## 2017-10-05 DIAGNOSIS — K219 Gastro-esophageal reflux disease without esophagitis: Secondary | ICD-10-CM | POA: Diagnosis not present

## 2017-10-05 DIAGNOSIS — G5600 Carpal tunnel syndrome, unspecified upper limb: Secondary | ICD-10-CM | POA: Diagnosis not present

## 2017-10-05 DIAGNOSIS — Z471 Aftercare following joint replacement surgery: Secondary | ICD-10-CM | POA: Diagnosis not present

## 2017-10-05 DIAGNOSIS — Z96652 Presence of left artificial knee joint: Secondary | ICD-10-CM | POA: Diagnosis not present

## 2017-10-05 NOTE — Telephone Encounter (Signed)
Call from [facility], physical therapist for [facility] verbal orders for home therapy: 1 time per week for 1 week, 2 times per week for 1 week; ph# [phone]

## 2017-10-05 NOTE — Telephone Encounter (Signed)
Spoke to [place], gave Verbal order for the one time therapy visit last week and three times this week, orders were faxed pre surgical to [facility].

## 2017-10-06 DIAGNOSIS — Z471 Aftercare following joint replacement surgery: Secondary | ICD-10-CM | POA: Diagnosis not present

## 2017-10-06 DIAGNOSIS — Z7901 Long term (current) use of anticoagulants: Secondary | ICD-10-CM | POA: Diagnosis not present

## 2017-10-06 DIAGNOSIS — K219 Gastro-esophageal reflux disease without esophagitis: Secondary | ICD-10-CM | POA: Diagnosis not present

## 2017-10-06 DIAGNOSIS — Z9181 History of falling: Secondary | ICD-10-CM | POA: Diagnosis not present

## 2017-10-06 DIAGNOSIS — I080 Rheumatic disorders of both mitral and aortic valves: Secondary | ICD-10-CM | POA: Diagnosis not present

## 2017-10-06 DIAGNOSIS — Z96652 Presence of left artificial knee joint: Secondary | ICD-10-CM | POA: Diagnosis not present

## 2017-10-06 DIAGNOSIS — G5600 Carpal tunnel syndrome, unspecified upper limb: Secondary | ICD-10-CM | POA: Diagnosis not present

## 2017-10-06 DIAGNOSIS — R1310 Dysphagia, unspecified: Secondary | ICD-10-CM | POA: Diagnosis not present

## 2017-10-07 ENCOUNTER — Encounter: Payer: Self-pay | Admitting: Orthopedic Surgery

## 2017-10-07 ENCOUNTER — Ambulatory Visit (INDEPENDENT_AMBULATORY_CARE_PROVIDER_SITE_OTHER): Payer: BLUE CROSS/BLUE SHIELD | Admitting: Orthopedic Surgery

## 2017-10-07 VITALS — BP 172/104 | HR 88 | Resp 16

## 2017-10-07 DIAGNOSIS — Z96652 Presence of left artificial knee joint: Secondary | ICD-10-CM

## 2017-10-07 MED ORDER — HYDROCODONE-ACETAMINOPHEN 10-325 MG PO TABS: 1.0000 | ORAL_TABLET | ORAL | 0 refills | Status: DC | PRN

## 2017-10-07 NOTE — Addendum Note (Signed)
Addended byCandice Camp on: 10/07/2017 03:20 PM   Modules accepted: Orders

## 2017-10-07 NOTE — Progress Notes (Signed)
Chief Complaint  Patient presents with  . Knee Pain    left     First postop visit patient had a left total knee on December 6 PT orders indicate 90 degrees of flexion patient having trouble taking pain medicine because of nausea and lightheadedness she is encouraged to do so.  Her knee looks good her staples were taken out every other 1 will take the rest of amount on Friday  She should continue physical therapy as an outpatient follow-up with me in 4 weeks after Friday appointment

## 2017-10-09 ENCOUNTER — Encounter: Payer: Self-pay | Admitting: Orthopedic Surgery

## 2017-10-09 ENCOUNTER — Ambulatory Visit (INDEPENDENT_AMBULATORY_CARE_PROVIDER_SITE_OTHER): Payer: BLUE CROSS/BLUE SHIELD | Admitting: Orthopedic Surgery

## 2017-10-09 DIAGNOSIS — Z96652 Presence of left artificial knee joint: Secondary | ICD-10-CM

## 2017-10-09 DIAGNOSIS — K219 Gastro-esophageal reflux disease without esophagitis: Secondary | ICD-10-CM | POA: Diagnosis not present

## 2017-10-09 DIAGNOSIS — R1310 Dysphagia, unspecified: Secondary | ICD-10-CM | POA: Diagnosis not present

## 2017-10-09 DIAGNOSIS — Z471 Aftercare following joint replacement surgery: Secondary | ICD-10-CM | POA: Diagnosis not present

## 2017-10-09 DIAGNOSIS — I080 Rheumatic disorders of both mitral and aortic valves: Secondary | ICD-10-CM | POA: Diagnosis not present

## 2017-10-09 DIAGNOSIS — Z9181 History of falling: Secondary | ICD-10-CM | POA: Diagnosis not present

## 2017-10-09 DIAGNOSIS — Z7901 Long term (current) use of anticoagulants: Secondary | ICD-10-CM | POA: Diagnosis not present

## 2017-10-09 DIAGNOSIS — G5600 Carpal tunnel syndrome, unspecified upper limb: Secondary | ICD-10-CM | POA: Diagnosis not present

## 2017-10-09 MED ORDER — HYDROCODONE-ACETAMINOPHEN 7.5-325 MG PO TABS: 1.0000 | ORAL_TABLET | Freq: Four times a day (QID) | ORAL | 0 refills | Status: DC | PRN

## 2017-10-09 NOTE — Progress Notes (Signed)
Patient ID: Rhonda Patton, female   DOB: 25-Jul-1961, 56 y.o.   MRN: [id number]  Chief Complaint  Patient presents with  . Routine Post Op    09/24/17 left TKR sutures removed    HPI Rhonda Patton is a 56 y.o. female.  Postop day 15 left total knee came in for remainder of staples to be removed   Allergies  Allergen Reactions  . Codeine Shortness Of Breath and Nausea And Vomiting  . Aspirin Other (See Comments)    REACTION: stomach upset    Current Outpatient Medications  Medication Sig Dispense Refill  . apixaban (ELIQUIS) 2.5 MG TABS tablet Take 1 tablet (2.5 mg total) by mouth every 12 (twelve) hours. 24 tablet 0  . bisacodyl (DULCOLAX) 5 MG EC tablet Take 1 tablet (5 mg total) by mouth daily as needed for moderate constipation. 30 tablet 0  . gabapentin (NEURONTIN) 300 MG capsule Take 300 mg by mouth 3 (three) times daily.   1  . hydrochlorothiazide (MICROZIDE) 12.5 MG capsule Take 12.5 mg by mouth daily.   1  . HYDROcodone-acetaminophen (NORCO) 10-325 MG tablet Take 1 tablet by mouth every 4 (four) hours as needed. 42 tablet 0  . methocarbamol (ROBAXIN) 750 MG tablet Take 750 mg by mouth every 6 (six) hours as needed for muscle spasms.   1  . pantoprazole (PROTONIX) 40 MG tablet Take 40 mg by mouth daily.  2  . promethazine (PHENERGAN) 25 MG tablet Take 1 tablet (25 mg total) by mouth every 6 (six) hours as needed for nausea or vomiting. 90 tablet 1   No current facility-administered medications for this visit.       Physical Exam Physical Exam   Appearance of incision: Incision is clean dry and intact we removed the remaining staples  The calf was supple and the Homans sign was normal, there is minimal peripheral edema  Assessment and plan The patient is doing well and is in good condition  Follow-up will be four weeks  9:30 AM [name], MD 10/09/2017

## 2017-10-14 DIAGNOSIS — Z471 Aftercare following joint replacement surgery: Secondary | ICD-10-CM | POA: Diagnosis not present

## 2017-10-14 DIAGNOSIS — Z96652 Presence of left artificial knee joint: Secondary | ICD-10-CM | POA: Diagnosis not present

## 2017-10-14 DIAGNOSIS — M6281 Muscle weakness (generalized): Secondary | ICD-10-CM | POA: Diagnosis not present

## 2017-10-14 DIAGNOSIS — R262 Difficulty in walking, not elsewhere classified: Secondary | ICD-10-CM | POA: Diagnosis not present

## 2017-10-14 DIAGNOSIS — M25562 Pain in left knee: Secondary | ICD-10-CM | POA: Diagnosis not present

## 2017-10-26 ENCOUNTER — Other Ambulatory Visit: Payer: Self-pay | Admitting: Orthopedic Surgery

## 2017-10-26 ENCOUNTER — Telehealth: Payer: Self-pay | Admitting: Orthopedic Surgery

## 2017-10-26 DIAGNOSIS — M6281 Muscle weakness (generalized): Secondary | ICD-10-CM | POA: Diagnosis not present

## 2017-10-26 DIAGNOSIS — Z96652 Presence of left artificial knee joint: Secondary | ICD-10-CM

## 2017-10-26 DIAGNOSIS — M25562 Pain in left knee: Secondary | ICD-10-CM | POA: Diagnosis not present

## 2017-10-26 DIAGNOSIS — R262 Difficulty in walking, not elsewhere classified: Secondary | ICD-10-CM | POA: Diagnosis not present

## 2017-10-26 MED ORDER — HYDROCODONE-ACETAMINOPHEN 7.5-325 MG PO TABS: 1.0000 | ORAL_TABLET | Freq: Four times a day (QID) | ORAL | 0 refills | Status: DC | PRN

## 2017-10-26 NOTE — Telephone Encounter (Signed)
I called her left message for her to call me back

## 2017-10-26 NOTE — Progress Notes (Signed)
Risk score 150

## 2017-10-26 NOTE — Telephone Encounter (Signed)
Thanks I have advised patient.

## 2017-10-26 NOTE — Telephone Encounter (Signed)
Stop eliquis  Script done

## 2017-10-26 NOTE — Telephone Encounter (Signed)
Patient having knee pain, working with PT has missed some visits is going today  Requesting refill of Hydrocodone (pended) Wants to know if ok to d/c Eliquis

## 2017-10-26 NOTE — Telephone Encounter (Signed)
Rhonda Patton called and asked to speak to "the lady who is Dr. Ruthe Mannan nurse or assistant" .  She has several questions she wanted to talk with you about.  Please give her a call when you have a minute  Thanks

## 2017-10-28 ENCOUNTER — Ambulatory Visit: Payer: BLUE CROSS/BLUE SHIELD | Admitting: Nurse Practitioner

## 2017-10-28 ENCOUNTER — Telehealth: Payer: Self-pay | Admitting: Orthopedic Surgery

## 2017-10-28 DIAGNOSIS — M6281 Muscle weakness (generalized): Secondary | ICD-10-CM | POA: Diagnosis not present

## 2017-10-28 DIAGNOSIS — Z96652 Presence of left artificial knee joint: Secondary | ICD-10-CM

## 2017-10-28 DIAGNOSIS — R262 Difficulty in walking, not elsewhere classified: Secondary | ICD-10-CM | POA: Diagnosis not present

## 2017-10-28 DIAGNOSIS — M25562 Pain in left knee: Secondary | ICD-10-CM | POA: Diagnosis not present

## 2017-10-28 NOTE — Telephone Encounter (Signed)
Patient requests order for shower chair; requests to be faxed to [facility]

## 2017-10-28 NOTE — Telephone Encounter (Signed)
called pt to advise order faxed

## 2017-10-28 NOTE — Telephone Encounter (Signed)
Will fax when signed by Dr Aline Brochure have printed order

## 2017-10-30 DIAGNOSIS — M6281 Muscle weakness (generalized): Secondary | ICD-10-CM | POA: Diagnosis not present

## 2017-10-30 DIAGNOSIS — M25562 Pain in left knee: Secondary | ICD-10-CM | POA: Diagnosis not present

## 2017-10-30 DIAGNOSIS — R262 Difficulty in walking, not elsewhere classified: Secondary | ICD-10-CM | POA: Diagnosis not present

## 2017-11-05 DIAGNOSIS — R262 Difficulty in walking, not elsewhere classified: Secondary | ICD-10-CM | POA: Diagnosis not present

## 2017-11-05 DIAGNOSIS — M6281 Muscle weakness (generalized): Secondary | ICD-10-CM | POA: Diagnosis not present

## 2017-11-05 DIAGNOSIS — M25562 Pain in left knee: Secondary | ICD-10-CM | POA: Diagnosis not present

## 2017-11-06 ENCOUNTER — Ambulatory Visit (INDEPENDENT_AMBULATORY_CARE_PROVIDER_SITE_OTHER): Payer: BLUE CROSS/BLUE SHIELD | Admitting: Orthopedic Surgery

## 2017-11-06 ENCOUNTER — Encounter: Payer: Self-pay | Admitting: Orthopedic Surgery

## 2017-11-06 VITALS — BP 148/98 | HR 75 | Ht 63.0 in | Wt 250.0 lb

## 2017-11-06 DIAGNOSIS — Z96652 Presence of left artificial knee joint: Secondary | ICD-10-CM

## 2017-11-06 MED ORDER — HYDROCODONE-ACETAMINOPHEN 7.5-325 MG PO TABS: 1.0000 | ORAL_TABLET | Freq: Three times a day (TID) | ORAL | 0 refills | Status: DC | PRN

## 2017-11-06 NOTE — Progress Notes (Signed)
Postop status post total knee replacement  Chief Complaint  Patient presents with  . Post-op Follow-up    left total knee replacement 09/24/17    Her knee looks good she can extend from a flexed position her PT notes indicate 103 degrees of knee flexion she has progress to a cane  She says it does hurt when they pushed the knee but she is making progress she is doing well she does complain of some back pain I advised her to place heat on that take ibuprofen  I have weaned her pain medication down to 1 q. 8 7.5 mg on the next visit we can go to 5 mg.  She is a patient who may need chronic pain management

## 2017-11-16 DIAGNOSIS — R262 Difficulty in walking, not elsewhere classified: Secondary | ICD-10-CM | POA: Diagnosis not present

## 2017-11-16 DIAGNOSIS — M25562 Pain in left knee: Secondary | ICD-10-CM | POA: Diagnosis not present

## 2017-11-16 DIAGNOSIS — M6281 Muscle weakness (generalized): Secondary | ICD-10-CM | POA: Diagnosis not present

## 2017-11-19 ENCOUNTER — Telehealth: Payer: Self-pay | Admitting: Orthopedic Surgery

## 2017-11-19 NOTE — Telephone Encounter (Signed)
Patient requests refill:  HYDROcodone-acetaminophen (NORCO) 7.5-325 MG tablet 42 tab   - [facility]

## 2017-11-20 ENCOUNTER — Other Ambulatory Visit: Payer: Self-pay | Admitting: Orthopedic Surgery

## 2017-11-20 DIAGNOSIS — M25562 Pain in left knee: Secondary | ICD-10-CM | POA: Diagnosis not present

## 2017-11-20 DIAGNOSIS — M6281 Muscle weakness (generalized): Secondary | ICD-10-CM | POA: Diagnosis not present

## 2017-11-20 DIAGNOSIS — R262 Difficulty in walking, not elsewhere classified: Secondary | ICD-10-CM | POA: Diagnosis not present

## 2017-11-20 MED ORDER — HYDROCODONE-ACETAMINOPHEN 5-325 MG PO TABS: 1.0000 | ORAL_TABLET | Freq: Four times a day (QID) | ORAL | 0 refills | Status: DC | PRN

## 2017-11-24 DIAGNOSIS — R262 Difficulty in walking, not elsewhere classified: Secondary | ICD-10-CM | POA: Diagnosis not present

## 2017-11-24 DIAGNOSIS — M25562 Pain in left knee: Secondary | ICD-10-CM | POA: Diagnosis not present

## 2017-11-24 DIAGNOSIS — M6281 Muscle weakness (generalized): Secondary | ICD-10-CM | POA: Diagnosis not present

## 2017-11-27 DIAGNOSIS — M6281 Muscle weakness (generalized): Secondary | ICD-10-CM | POA: Diagnosis not present

## 2017-11-27 DIAGNOSIS — M25562 Pain in left knee: Secondary | ICD-10-CM | POA: Diagnosis not present

## 2017-11-27 DIAGNOSIS — R262 Difficulty in walking, not elsewhere classified: Secondary | ICD-10-CM | POA: Diagnosis not present

## 2017-12-02 ENCOUNTER — Telehealth: Payer: Self-pay | Admitting: Orthopedic Surgery

## 2017-12-02 MED ORDER — HYDROCODONE-ACETAMINOPHEN 5-325 MG PO TABS: 1.0000 | ORAL_TABLET | Freq: Four times a day (QID) | ORAL | 0 refills | Status: DC | PRN

## 2017-12-02 NOTE — Telephone Encounter (Signed)
Patient of Dr. Ruthe Mannan requests refill on Hydrocodone/Acetaminophen 5-325  Mgs.  Qty  30        Sig: Take 1 tablet by mouth every 6 (six) hours as needed for moderate pain.           Rhonda Patton states she uses [facility] in [place], [place]

## 2017-12-11 DIAGNOSIS — M25562 Pain in left knee: Secondary | ICD-10-CM | POA: Diagnosis not present

## 2017-12-11 DIAGNOSIS — M6281 Muscle weakness (generalized): Secondary | ICD-10-CM | POA: Diagnosis not present

## 2017-12-11 DIAGNOSIS — R262 Difficulty in walking, not elsewhere classified: Secondary | ICD-10-CM | POA: Diagnosis not present

## 2017-12-16 ENCOUNTER — Encounter: Payer: Self-pay | Admitting: Orthopedic Surgery

## 2017-12-16 ENCOUNTER — Ambulatory Visit (INDEPENDENT_AMBULATORY_CARE_PROVIDER_SITE_OTHER): Payer: BLUE CROSS/BLUE SHIELD | Admitting: Orthopedic Surgery

## 2017-12-16 VITALS — BP 155/102 | HR 82 | Ht 63.0 in | Wt 254.0 lb

## 2017-12-16 DIAGNOSIS — M48061 Spinal stenosis, lumbar region without neurogenic claudication: Secondary | ICD-10-CM

## 2017-12-16 DIAGNOSIS — Z96652 Presence of left artificial knee joint: Secondary | ICD-10-CM

## 2017-12-16 DIAGNOSIS — M9983 Other biomechanical lesions of lumbar region: Secondary | ICD-10-CM

## 2017-12-16 MED ORDER — HYDROCODONE-ACETAMINOPHEN 5-325 MG PO TABS: 1.0000 | ORAL_TABLET | Freq: Four times a day (QID) | ORAL | 0 refills | Status: DC | PRN

## 2017-12-16 NOTE — Patient Instructions (Addendum)
Continue therapy  Add BenGay or topical muscle cream for knee pain use heating pad as needed  Set up will be done to get an MRI to prepare for the epidural injections of the back  NEEDS HANDICAP RENEWAL FOR PERMANENT STICKER

## 2017-12-16 NOTE — Progress Notes (Signed)
POST OP VISIT   Patient ID: Rhonda Patton, female   DOB: Mar 04, 1961, 57 y.o.   MRN: [id number]  Chief Complaint  Patient presents with  . Post-op Follow-up    left knee replacement 09/24/17    Encounter Diagnosis  Name Primary?  . S/P total knee replacement, left 09/24/17 Yes   12 TH WEEK  The patient says that she is having a lot of pain dull aching pain left knee  Her knee looks great there is no erythema no drainage her incision healed nicely extends fully she flexes 105 degrees there is no warmth to the knee the knee is stable  Prior to surgery we had entertained the thought of may be getting epidural injections based on her prior MRI in 2016 which showed stable mild bulging disc at L1 and 2, mild disc bulge and facet arthritis L2 and 3, mild to moderate facet with mild bulging disc at L3 and 4 ligamentous hypertrophy was noted at that time  L4 and 5 had advanced facet arthritis grade 1 anterolisthesis bulging disc mild asymmetric narrowing of the left lateral recess which had progressed there was mild central stenosis  L5-S1 disc height and hydration maintained stable bilateral facet hypertrophy without spinal stenosis  So we will probably need a new MRI because that one was done in 2016 and then we can do the epidural injections  She will continue with hydrocodone and therapy and see me after I review the MRI and set up.  Encounter Diagnoses  Name Primary?  . S/P total knee replacement, left 09/24/17 Yes  . Foraminal stenosis of lumbar region

## 2017-12-17 ENCOUNTER — Telehealth: Payer: Self-pay | Admitting: Orthopedic Surgery

## 2017-12-17 NOTE — Telephone Encounter (Signed)
Patient called, left voice message to request to speak with Amy regarding MRI.  Please call [phone]

## 2017-12-18 ENCOUNTER — Telehealth: Payer: Self-pay | Admitting: Radiology

## 2017-12-18 ENCOUNTER — Other Ambulatory Visit: Payer: Self-pay | Admitting: Orthopedic Surgery

## 2017-12-18 DIAGNOSIS — M51369 Other intervertebral disc degeneration, lumbar region without mention of lumbar back pain or lower extremity pain: Secondary | ICD-10-CM

## 2017-12-18 DIAGNOSIS — M5136 Other intervertebral disc degeneration, lumbar region: Secondary | ICD-10-CM

## 2017-12-18 NOTE — Telephone Encounter (Signed)
Knee or back???

## 2017-12-18 NOTE — Telephone Encounter (Signed)
Your office notes form this week indicate last MRI was from 2016 but I have located the MRI she had in Oct 2018 will you review?  I will call patient to update her.   MRI report is on your desk beside your computer.

## 2017-12-18 NOTE — Telephone Encounter (Signed)
Discussed with Dr Oren Binet to proceed with ESI now at this point MRI has been sent to be scanned into system.  Ordered ESI's   Called patient She has the disc of the images and will take with her to the appointment  I will advise Angelita Ingles at [facility].

## 2017-12-18 NOTE — Telephone Encounter (Signed)
Back MRI Lumbar/ 2018 October, it was not scanned into computer, she has also seen Neurosurgeon (you referred her) I have gotten the MRI report and called NS office for notes since they were never received.   Your plan at the time was ESI if Surgeon did not recommend surgery.

## 2017-12-29 ENCOUNTER — Other Ambulatory Visit: Payer: Self-pay | Admitting: Orthopedic Surgery

## 2017-12-29 NOTE — Telephone Encounter (Signed)
Patient requests a refill on Hydrocodone/Acetaminophen 5-325  Mgs.   Qty  30       Sig: Take 1 tablet by mouth every 6 (six) hours as needed for moderate pain.   Patient states she uses [facility]

## 2017-12-30 ENCOUNTER — Inpatient Hospital Stay
Admission: RE | Admit: 2017-12-30 | Discharge: 2017-12-30 | Disposition: A | Discharge status: Home / Self Care | Payer: BLUE CROSS/BLUE SHIELD | Source: Ambulatory Visit | Attending: Orthopedic Surgery | Admitting: Orthopedic Surgery

## 2017-12-30 MED ORDER — HYDROCODONE-ACETAMINOPHEN 5-325 MG PO TABS: 1.0000 | ORAL_TABLET | Freq: Four times a day (QID) | ORAL | 0 refills | Status: DC | PRN

## 2017-12-30 NOTE — Discharge Instructions (Signed)

## 2018-01-05 DIAGNOSIS — Z0000 Encounter for general adult medical examination without abnormal findings: Secondary | ICD-10-CM | POA: Diagnosis not present

## 2018-01-06 ENCOUNTER — Ambulatory Visit
Admission: RE | Admit: 2018-01-06 | Discharge: 2018-01-06 | Disposition: A | Discharge status: Home / Self Care | Payer: BLUE CROSS/BLUE SHIELD | Source: Ambulatory Visit | Attending: Orthopedic Surgery | Admitting: Orthopedic Surgery

## 2018-01-06 DIAGNOSIS — M47817 Spondylosis without myelopathy or radiculopathy, lumbosacral region: Secondary | ICD-10-CM | POA: Diagnosis not present

## 2018-01-06 DIAGNOSIS — M5136 Other intervertebral disc degeneration, lumbar region: Secondary | ICD-10-CM

## 2018-01-06 MED ORDER — METHYLPREDNISOLONE ACETATE 40 MG/ML INJ SUSP (RADIOLOG
120.0000 mg | Freq: Once | INTRAMUSCULAR | Status: AC
  Administered 2018-01-06: 120 mg via EPIDURAL

## 2018-01-06 MED ORDER — IOPAMIDOL (ISOVUE-M 200) INJECTION 41%
1.0000 mL | Freq: Once | INTRAMUSCULAR | Status: AC
  Administered 2018-01-06: 1 mL via EPIDURAL

## 2018-01-06 NOTE — Discharge Instructions (Signed)

## 2018-01-13 ENCOUNTER — Other Ambulatory Visit: Payer: Self-pay | Admitting: Orthopedic Surgery

## 2018-01-13 DIAGNOSIS — I10 Essential (primary) hypertension: Secondary | ICD-10-CM | POA: Diagnosis not present

## 2018-01-13 DIAGNOSIS — R7301 Impaired fasting glucose: Secondary | ICD-10-CM | POA: Diagnosis not present

## 2018-01-13 DIAGNOSIS — K219 Gastro-esophageal reflux disease without esophagitis: Secondary | ICD-10-CM | POA: Diagnosis not present

## 2018-01-13 NOTE — Telephone Encounter (Signed)
Patient requests refill on Hydrocodone/Acetaminophen 5-325  Mgs.   Qty  30  Sig: Take 1 tablet by mouth every 6 (six) hours as needed for moderate pain.  Patient states she uses [facility]

## 2018-01-14 MED ORDER — HYDROCODONE-ACETAMINOPHEN 5-325 MG PO TABS: 1.0000 | ORAL_TABLET | Freq: Three times a day (TID) | ORAL | 0 refills | Status: DC | PRN

## 2018-01-27 ENCOUNTER — Encounter: Payer: Self-pay | Admitting: Orthopedic Surgery

## 2018-01-27 ENCOUNTER — Ambulatory Visit: Payer: BLUE CROSS/BLUE SHIELD | Admitting: Orthopedic Surgery

## 2018-01-27 ENCOUNTER — Ambulatory Visit (INDEPENDENT_AMBULATORY_CARE_PROVIDER_SITE_OTHER): Payer: BLUE CROSS/BLUE SHIELD

## 2018-01-27 VITALS — BP 140/80 | HR 73 | Ht 63.0 in | Wt 255.0 lb

## 2018-01-27 DIAGNOSIS — M25562 Pain in left knee: Secondary | ICD-10-CM

## 2018-01-27 DIAGNOSIS — M5442 Lumbago with sciatica, left side: Secondary | ICD-10-CM

## 2018-01-27 DIAGNOSIS — G8929 Other chronic pain: Secondary | ICD-10-CM

## 2018-01-27 DIAGNOSIS — Z96652 Presence of left artificial knee joint: Secondary | ICD-10-CM

## 2018-01-27 NOTE — Patient Instructions (Signed)
Call Roberta to schedule your second injection at [facility]. The number is [phone]

## 2018-01-27 NOTE — Progress Notes (Signed)
Progress Note   Patient ID: Rhonda Patton, female   DOB: [birth year], 57 y.o.   MRN: [id number]  Chief Complaint  Patient presents with  . Knee Pain    left knee s/p replacement still c/o pain, wants a scan  . Back Pain    a little better after the ESI, states will need another scan     57 year old female status post knee arthroscopy then left total knee replacement complains of left leg and knee pain lower back pain had one ESI's it did not help her back.  She complains of anterolateral knee pain near the patellofemoral area and pain along the quadriceps tendon  She is on hydrocodone and gabapentin 300 mg she is still using her cane    Review of Systems  Constitutional: Negative for chills, fever and malaise/fatigue.  Musculoskeletal: Positive for back pain.   Current Meds  Medication Sig  . gabapentin (NEURONTIN) 300 MG capsule Take 300 mg by mouth 3 (three) times daily.   . hydrochlorothiazide (MICROZIDE) 12.5 MG capsule Take 12.5 mg by mouth daily.   Marland Kitchen HYDROcodone-acetaminophen (NORCO) 5-325 MG tablet Take 1 tablet by mouth every 8 (eight) hours as needed for moderate pain.  . methocarbamol (ROBAXIN) 750 MG tablet Take 750 mg by mouth every 6 (six) hours as needed for muscle spasms.   . pantoprazole (PROTONIX) 40 MG tablet Take 40 mg by mouth daily.    Allergies  Allergen Reactions  . Codeine Shortness Of Breath and Nausea And Vomiting  . Aspirin Nausea Only     BP 140/80   Pulse 73   Ht 5\' 3"  (1.6 m)   Wt 255 lb (115.7 kg)   BMI 45.17 kg/m   Physical Exam The patient is amatory with a cane  She is awake alert and oriented x3 mood and affect flat but not depressed  Her knee extension is normal her knee flexion is about 115 degrees her knee is stable at 90 degrees she has tenderness in the quadriceps and tenderness over the anterolateral supra patellofemoral region no pain along the joint lines no effusion There is no erythema no warmth no  swelling    Medical decision-making  X-ray looks perfectly normal alignment and no loosening A dictated report is been completed  Critically there may be 1 or 2 degrees of patellar tilt   Encounter Diagnoses  Name Primary?  . S/P total knee replacement, left 09/24/17 Yes  . Chronic pain of left knee   . Chronic bilateral low back pain with left-sided sciatica    There is no reason for any further workup the x-ray and clinical exam do not show any abnormalities or concern    rec injection esi  Fu in 3 months     [name], MD 01/27/2018 2:36 PM

## 2018-01-28 ENCOUNTER — Other Ambulatory Visit: Payer: Self-pay | Admitting: Orthopedic Surgery

## 2018-01-28 NOTE — Telephone Encounter (Signed)
Rhonda Patton called and left a message this afternoon stating that she was here yesterday and saw Dr. Aline Brochure.  She said at that time she was under the impression that he was going to send her pain medication, Hydrocodone/Actaminophen 5-325 mgs. To [facility] for her.  As of this afternoon, she says this still has not been done.  Would you check this out for me please?  Thanks

## 2018-01-29 ENCOUNTER — Telehealth: Payer: Self-pay | Admitting: Orthopedic Surgery

## 2018-01-29 MED ORDER — HYDROCODONE-ACETAMINOPHEN 5-325 MG PO TABS: 1.0000 | ORAL_TABLET | Freq: Three times a day (TID) | ORAL | 0 refills | Status: DC | PRN

## 2018-01-29 NOTE — Telephone Encounter (Signed)
Rhonda Patton, thank you so much for helping me with this one. I appreciate you so much.

## 2018-01-29 NOTE — Telephone Encounter (Signed)
Patient called and wanted to know if she has to contact [facility] to schedule her second injection. I have advised her that she has the number for [facility] and she can contact [facility] and schedule her next appointment.   Just for you FYI

## 2018-02-01 ENCOUNTER — Other Ambulatory Visit: Payer: Self-pay | Admitting: Orthopedic Surgery

## 2018-02-01 DIAGNOSIS — G8929 Other chronic pain: Secondary | ICD-10-CM

## 2018-02-01 DIAGNOSIS — M545 Low back pain: Principal | ICD-10-CM

## 2018-02-09 ENCOUNTER — Other Ambulatory Visit (HOSPITAL_COMMUNITY): Payer: Self-pay | Admitting: Internal Medicine

## 2018-02-09 DIAGNOSIS — H547 Unspecified visual loss: Secondary | ICD-10-CM

## 2018-02-09 DIAGNOSIS — R42 Dizziness and giddiness: Secondary | ICD-10-CM

## 2018-02-11 ENCOUNTER — Other Ambulatory Visit: Payer: Self-pay | Admitting: Orthopedic Surgery

## 2018-02-11 ENCOUNTER — Ambulatory Visit (HOSPITAL_COMMUNITY)
Admission: RE | Admit: 2018-02-11 | Discharge: 2018-02-11 | Disposition: A | Discharge status: Home / Self Care | Payer: BLUE CROSS/BLUE SHIELD | Source: Ambulatory Visit | Attending: Internal Medicine | Admitting: Internal Medicine

## 2018-02-11 DIAGNOSIS — H547 Unspecified visual loss: Secondary | ICD-10-CM | POA: Diagnosis not present

## 2018-02-11 DIAGNOSIS — R42 Dizziness and giddiness: Secondary | ICD-10-CM

## 2018-02-11 NOTE — Telephone Encounter (Signed)
Hydrocodone-Acetaminophen  5/325 mg Qty 21 Tablets  Take 1 tablet by mouth every 8 (eight) hours as needed for moderate pain.  PATIENT USES [facility]  Patient has been informed about calling before lunch on Thursday for refills.

## 2018-02-12 ENCOUNTER — Ambulatory Visit
Admission: RE | Admit: 2018-02-12 | Discharge: 2018-02-12 | Disposition: A | Discharge status: Home / Self Care | Payer: BLUE CROSS/BLUE SHIELD | Source: Ambulatory Visit | Attending: Orthopedic Surgery | Admitting: Orthopedic Surgery

## 2018-02-12 DIAGNOSIS — M545 Low back pain: Principal | ICD-10-CM

## 2018-02-12 DIAGNOSIS — G8929 Other chronic pain: Secondary | ICD-10-CM

## 2018-02-12 DIAGNOSIS — M5126 Other intervertebral disc displacement, lumbar region: Secondary | ICD-10-CM | POA: Diagnosis not present

## 2018-02-12 MED ORDER — METHYLPREDNISOLONE ACETATE 40 MG/ML INJ SUSP (RADIOLOG
120.0000 mg | Freq: Once | INTRAMUSCULAR | Status: AC
  Administered 2018-02-12: 120 mg via EPIDURAL

## 2018-02-12 MED ORDER — IOPAMIDOL (ISOVUE-M 200) INJECTION 41%
1.0000 mL | Freq: Once | INTRAMUSCULAR | Status: AC
  Administered 2018-02-12: 1 mL via EPIDURAL

## 2018-02-12 MED ORDER — HYDROCODONE-ACETAMINOPHEN 5-325 MG PO TABS: 1.0000 | ORAL_TABLET | Freq: Three times a day (TID) | ORAL | 0 refills | Status: DC | PRN

## 2018-02-12 NOTE — Discharge Instructions (Signed)

## 2018-02-25 ENCOUNTER — Other Ambulatory Visit: Payer: Self-pay | Admitting: Orthopedic Surgery

## 2018-02-25 MED ORDER — HYDROCODONE-ACETAMINOPHEN 5-325 MG PO TABS: 1.0000 | ORAL_TABLET | Freq: Three times a day (TID) | ORAL | 0 refills | Status: DC | PRN

## 2018-02-25 NOTE — Telephone Encounter (Signed)
Patient requests refill on Hydrocodone/Acetaminophen 5-325 mgs.  Qty  21        Sig: Take 1 tablet by mouth every 8 (eight) hours as needed for moderate pain.     Patient states she uses [facility]

## 2018-03-11 ENCOUNTER — Other Ambulatory Visit: Payer: Self-pay | Admitting: Orthopedic Surgery

## 2018-03-11 DIAGNOSIS — Z6841 Body Mass Index (BMI) 40.0 and over, adult: Secondary | ICD-10-CM | POA: Diagnosis not present

## 2018-03-11 DIAGNOSIS — D649 Anemia, unspecified: Secondary | ICD-10-CM | POA: Diagnosis not present

## 2018-03-11 DIAGNOSIS — R5383 Other fatigue: Secondary | ICD-10-CM | POA: Diagnosis not present

## 2018-03-11 DIAGNOSIS — I10 Essential (primary) hypertension: Secondary | ICD-10-CM | POA: Diagnosis not present

## 2018-03-11 NOTE — Telephone Encounter (Signed)
Patient called and requested a refill on Hydrocodone/Aetaminophen 5-325  Mgs.  Qty  21       Sig: Take 1 tablet by mouth every 8 (eight) hours as needed for moderate pain.     Patient states she uses [facility]

## 2018-03-12 MED ORDER — HYDROCODONE-ACETAMINOPHEN 5-325 MG PO TABS: 1.0000 | ORAL_TABLET | Freq: Three times a day (TID) | ORAL | 0 refills | Status: DC | PRN

## 2018-03-24 ENCOUNTER — Other Ambulatory Visit: Payer: Self-pay | Admitting: Orthopedic Surgery

## 2018-03-24 MED ORDER — HYDROCODONE-ACETAMINOPHEN 5-325 MG PO TABS: 1.0000 | ORAL_TABLET | Freq: Two times a day (BID) | ORAL | 0 refills | Status: DC | PRN

## 2018-03-24 NOTE — Telephone Encounter (Signed)
Patient requests refill:  HYDROcodone-acetaminophen (NORCO) 5-325 MG tablet 21 tablet   - [facility]

## 2018-04-01 ENCOUNTER — Other Ambulatory Visit: Payer: Self-pay | Admitting: Orthopedic Surgery

## 2018-04-01 ENCOUNTER — Telehealth: Payer: Self-pay | Admitting: Orthopedic Surgery

## 2018-04-01 DIAGNOSIS — M5442 Lumbago with sciatica, left side: Principal | ICD-10-CM

## 2018-04-01 DIAGNOSIS — G8929 Other chronic pain: Secondary | ICD-10-CM

## 2018-04-01 NOTE — Telephone Encounter (Signed)
Patient is asking for you to give her a call. She is wanting you to set up an appointment for her for an injection  Please call

## 2018-04-01 NOTE — Telephone Encounter (Signed)
Put in the order for the 3rd lumbar ESI patient will call to schedule.

## 2018-04-01 NOTE — Telephone Encounter (Signed)
Hydrocodone - Acetaminophen  5/325mg  Qty 14 Tablets ° °Take 1 tablet by mouth every 12 (twelve)hours as needed for moderate pain. ° °PATIENT USES [facility] °

## 2018-04-02 ENCOUNTER — Other Ambulatory Visit: Payer: Self-pay | Admitting: Orthopedic Surgery

## 2018-04-02 DIAGNOSIS — G8929 Other chronic pain: Secondary | ICD-10-CM

## 2018-04-02 DIAGNOSIS — M5442 Lumbago with sciatica, left side: Principal | ICD-10-CM

## 2018-04-02 MED ORDER — HYDROCODONE-ACETAMINOPHEN 5-325 MG PO TABS: 1.0000 | ORAL_TABLET | Freq: Two times a day (BID) | ORAL | 0 refills | Status: DC | PRN

## 2018-04-02 NOTE — Telephone Encounter (Signed)
Send to pain manage

## 2018-04-08 ENCOUNTER — Other Ambulatory Visit: Payer: Self-pay | Admitting: Orthopedic Surgery

## 2018-04-08 NOTE — Telephone Encounter (Signed)
Hydrocodone - Acetaminophen  5/325mg   Qty 14 Tablets  Take 1 tablet by mouth every 12 (twelve)hours as needed for moderate pain.  PATIENT USES [facility]

## 2018-04-09 MED ORDER — HYDROCODONE-ACETAMINOPHEN 5-325 MG PO TABS: 1.0000 | ORAL_TABLET | Freq: Two times a day (BID) | ORAL | 0 refills | Status: DC | PRN

## 2018-04-13 ENCOUNTER — Other Ambulatory Visit: Payer: BLUE CROSS/BLUE SHIELD

## 2018-04-19 ENCOUNTER — Other Ambulatory Visit: Payer: Self-pay | Admitting: Orthopedic Surgery

## 2018-04-19 ENCOUNTER — Inpatient Hospital Stay: Admission: RE | Admit: 2018-04-19 | Payer: BLUE CROSS/BLUE SHIELD | Source: Ambulatory Visit

## 2018-04-19 MED ORDER — HYDROCODONE-ACETAMINOPHEN 5-325 MG PO TABS: 1.0000 | ORAL_TABLET | Freq: Two times a day (BID) | ORAL | 0 refills | Status: AC | PRN

## 2018-04-19 NOTE — Telephone Encounter (Signed)
Hydrocodone-Acetaminophen  5/325 mg  Qty 14 Tablets  Take 1 tablet by mouth every 12 (twelve) hours as needed for up to 7 days for moderate pain.  PATIENT USES [facility]

## 2018-04-27 DIAGNOSIS — H35033 Hypertensive retinopathy, bilateral: Secondary | ICD-10-CM | POA: Diagnosis not present

## 2018-04-28 ENCOUNTER — Encounter: Payer: Self-pay | Admitting: Orthopedic Surgery

## 2018-04-28 ENCOUNTER — Ambulatory Visit (INDEPENDENT_AMBULATORY_CARE_PROVIDER_SITE_OTHER): Payer: BLUE CROSS/BLUE SHIELD | Admitting: Orthopedic Surgery

## 2018-04-28 VITALS — BP 125/83 | HR 64 | Ht 63.0 in | Wt 262.0 lb

## 2018-04-28 DIAGNOSIS — Z96652 Presence of left artificial knee joint: Secondary | ICD-10-CM | POA: Diagnosis not present

## 2018-04-28 DIAGNOSIS — Z6841 Body Mass Index (BMI) 40.0 and over, adult: Secondary | ICD-10-CM | POA: Diagnosis not present

## 2018-04-28 DIAGNOSIS — G8929 Other chronic pain: Secondary | ICD-10-CM | POA: Diagnosis not present

## 2018-04-28 DIAGNOSIS — M5442 Lumbago with sciatica, left side: Secondary | ICD-10-CM

## 2018-04-28 NOTE — Progress Notes (Signed)
Chief Complaint  Patient presents with  . Knee Pain    s/p left total knee 09/24/18 improving  . Back Pain    has ESI #3 scheduled for tomorrow       Review of Systems  Musculoskeletal: Positive for back pain.  Neurological: Positive for tingling. Negative for sensory change, focal weakness and weakness.    BP 125/83   Pulse 64   Ht 5\' 3"  (1.6 m)   Wt 262 lb (118.8 kg)   BMI 46.41 kg/m   Physical Exam  Constitutional: She is oriented to person, place, and time. She appears well-developed and well-nourished.  Musculoskeletal:  GAIT : Abnormal, requires cane.  Left knee range of motion is now 0 to 115 degrees knee is stable in all planes incision is well-healed without erythema there is no effusion or extension power has returned to normal  She does have tenderness in the peripatellar region lateral side of the patellofemoral joint  Neurological: She is alert and oriented to person, place, and time.  Psychiatric: She has a normal mood and affect. Judgment normal.  Vitals reviewed.  Encounter Diagnoses  Name Primary?  . S/P total knee replacement, left 09/24/17 Yes  . Chronic bilateral low back pain with left-sided sciatica   . Body mass index 45.0-49.9, adult ([place])   . Morbid obesity ([place])     Knee replacement doing well she is regained functional range of motion peripatellar pain should improve with time  Lower back pain she is received 2 ESI's she will get the third 1 next week  OBESITY:  COUNSELING: WORK ON WEIGHT LOSS, DIET CONTROL WITH LOW CARBS AND DRINK WATER , EXERCISE WITH LIGHT AEROBIC ACTIVITY

## 2018-04-29 ENCOUNTER — Ambulatory Visit
Admission: RE | Admit: 2018-04-29 | Discharge: 2018-04-29 | Disposition: A | Discharge status: Home / Self Care | Payer: BLUE CROSS/BLUE SHIELD | Source: Ambulatory Visit | Attending: Orthopedic Surgery | Admitting: Orthopedic Surgery

## 2018-04-29 ENCOUNTER — Other Ambulatory Visit: Payer: Self-pay | Admitting: Orthopedic Surgery

## 2018-04-29 DIAGNOSIS — G8929 Other chronic pain: Secondary | ICD-10-CM

## 2018-04-29 DIAGNOSIS — M5442 Lumbago with sciatica, left side: Principal | ICD-10-CM

## 2018-04-29 DIAGNOSIS — M47817 Spondylosis without myelopathy or radiculopathy, lumbosacral region: Secondary | ICD-10-CM | POA: Diagnosis not present

## 2018-04-29 MED ORDER — IOPAMIDOL (ISOVUE-M 200) INJECTION 41%
1.0000 mL | Freq: Once | INTRAMUSCULAR | Status: AC
  Administered 2018-04-29: 1 mL via EPIDURAL

## 2018-04-29 MED ORDER — HYDROCODONE-ACETAMINOPHEN 5-325 MG PO TABS: ORAL_TABLET | ORAL | 0 refills | Status: DC

## 2018-04-29 MED ORDER — METHYLPREDNISOLONE ACETATE 40 MG/ML INJ SUSP (RADIOLOG
120.0000 mg | Freq: Once | INTRAMUSCULAR | Status: AC
  Administered 2018-04-29: 120 mg via EPIDURAL

## 2018-04-29 NOTE — Telephone Encounter (Signed)
Hydrocodone-Acetaminophen 5/325 MG  Qty   Take 1 tablet by mouth 2 (two) times daily as needed for moderate pain.  PATIENT USES [facility]

## 2018-04-29 NOTE — Discharge Instructions (Signed)

## 2018-05-03 ENCOUNTER — Telehealth: Payer: Self-pay | Admitting: Orthopedic Surgery

## 2018-05-03 DIAGNOSIS — G8929 Other chronic pain: Secondary | ICD-10-CM

## 2018-05-03 DIAGNOSIS — M5442 Lumbago with sciatica, left side: Principal | ICD-10-CM

## 2018-05-03 NOTE — Telephone Encounter (Signed)
Call from Bailey at [facility]-ph# [phone], fax [phone]; orders needed per Dr Maree Erie. States Dr Aline Brochure had ordered lumbar epidural, and states Dr Maree Erie elected to change, and perform, bilateral facet L4 and L5. Asking for orders, and states this has been done (04/29/18). States she will check [facility] Epic system for the orders.

## 2018-05-03 NOTE — Telephone Encounter (Signed)
Left message for roberta to help me with imaging codes, so I can do this correctly.

## 2018-05-07 ENCOUNTER — Other Ambulatory Visit: Payer: Self-pay | Admitting: Orthopedic Surgery

## 2018-05-10 MED ORDER — HYDROCODONE-ACETAMINOPHEN 5-325 MG PO TABS: ORAL_TABLET | ORAL | 0 refills | Status: DC

## 2018-05-17 ENCOUNTER — Other Ambulatory Visit: Payer: Self-pay | Admitting: Orthopedic Surgery

## 2018-05-18 MED ORDER — HYDROCODONE-ACETAMINOPHEN 5-325 MG PO TABS: ORAL_TABLET | ORAL | 0 refills | Status: DC

## 2018-05-24 ENCOUNTER — Other Ambulatory Visit: Payer: Self-pay | Admitting: Orthopaedic Surgery

## 2018-05-25 MED ORDER — HYDROCODONE-ACETAMINOPHEN 5-325 MG PO TABS: ORAL_TABLET | ORAL | 0 refills | Status: DC

## 2018-06-02 ENCOUNTER — Other Ambulatory Visit: Payer: Self-pay

## 2018-06-03 ENCOUNTER — Other Ambulatory Visit: Payer: Self-pay | Admitting: Orthopedic Surgery

## 2018-06-03 ENCOUNTER — Telehealth: Payer: Self-pay | Admitting: Radiology

## 2018-06-03 DIAGNOSIS — M5442 Lumbago with sciatica, left side: Principal | ICD-10-CM

## 2018-06-03 DIAGNOSIS — G8929 Other chronic pain: Secondary | ICD-10-CM

## 2018-06-03 DIAGNOSIS — M25562 Pain in left knee: Secondary | ICD-10-CM

## 2018-06-03 MED ORDER — HYDROCODONE-ACETAMINOPHEN 5-325 MG PO TABS: 1.0000 | ORAL_TABLET | Freq: Two times a day (BID) | ORAL | 0 refills | Status: AC | PRN

## 2018-06-03 NOTE — Telephone Encounter (Signed)
Dr Aline Brochure wants her to go to pain management.  Put in referral, will call patient

## 2018-06-03 NOTE — Telephone Encounter (Signed)
I called patient she agrees to the referral, I have faxed over to [facility]

## 2018-06-07 ENCOUNTER — Telehealth: Payer: Self-pay | Admitting: Orthopedic Surgery

## 2018-06-07 NOTE — Telephone Encounter (Signed)
Patient called back with a phone number for pain management in [place] - it is an old phone number. I re-verified with [facility]) that there is not currently any pain management center in [place]. Please advise patient.

## 2018-06-07 NOTE — Telephone Encounter (Signed)
I spoke to patient she states there is a pain clinic in [place] she will call me back with the phone number. I told her that is fine, but once she is established, she can not change providers. If she is not happy she still has to stay there, can not resend referral once she is established. She understands.

## 2018-06-07 NOTE — Telephone Encounter (Signed)
Patient would like to speak with you about being referred to a pain management in [place] instead of [place]. Stated she doesn't do [place] very well. I told her you would give her a call as soon as you can.  Please call and advise

## 2018-06-08 NOTE — Telephone Encounter (Signed)
I have advised patient she will call the office in [place] to schedule.

## 2018-06-10 DIAGNOSIS — H35033 Hypertensive retinopathy, bilateral: Secondary | ICD-10-CM | POA: Diagnosis not present

## 2018-06-10 DIAGNOSIS — E119 Type 2 diabetes mellitus without complications: Secondary | ICD-10-CM | POA: Diagnosis not present

## 2018-06-10 DIAGNOSIS — H25013 Cortical age-related cataract, bilateral: Secondary | ICD-10-CM | POA: Diagnosis not present

## 2018-06-10 DIAGNOSIS — H2512 Age-related nuclear cataract, left eye: Secondary | ICD-10-CM | POA: Diagnosis not present

## 2018-06-10 DIAGNOSIS — H2513 Age-related nuclear cataract, bilateral: Secondary | ICD-10-CM | POA: Diagnosis not present

## 2018-06-16 ENCOUNTER — Ambulatory Visit (INDEPENDENT_AMBULATORY_CARE_PROVIDER_SITE_OTHER): Payer: BLUE CROSS/BLUE SHIELD | Admitting: Orthopedic Surgery

## 2018-06-16 ENCOUNTER — Encounter: Payer: Self-pay | Admitting: Orthopedic Surgery

## 2018-06-16 VITALS — BP 130/85 | HR 76 | Ht 63.0 in | Wt 266.0 lb

## 2018-06-16 DIAGNOSIS — Z96652 Presence of left artificial knee joint: Secondary | ICD-10-CM | POA: Diagnosis not present

## 2018-06-16 DIAGNOSIS — M5442 Lumbago with sciatica, left side: Secondary | ICD-10-CM | POA: Diagnosis not present

## 2018-06-16 DIAGNOSIS — D649 Anemia, unspecified: Secondary | ICD-10-CM | POA: Diagnosis not present

## 2018-06-16 DIAGNOSIS — G8929 Other chronic pain: Secondary | ICD-10-CM

## 2018-06-16 DIAGNOSIS — R5383 Other fatigue: Secondary | ICD-10-CM | POA: Diagnosis not present

## 2018-06-16 DIAGNOSIS — I10 Essential (primary) hypertension: Secondary | ICD-10-CM | POA: Diagnosis not present

## 2018-06-16 DIAGNOSIS — J40 Bronchitis, not specified as acute or chronic: Secondary | ICD-10-CM | POA: Diagnosis not present

## 2018-06-16 DIAGNOSIS — Z0000 Encounter for general adult medical examination without abnormal findings: Secondary | ICD-10-CM | POA: Diagnosis not present

## 2018-06-16 MED ORDER — HYDROCODONE-ACETAMINOPHEN 5-325 MG PO TABS: 1.0000 | ORAL_TABLET | Freq: Four times a day (QID) | ORAL | 0 refills | Status: DC | PRN

## 2018-06-16 NOTE — Progress Notes (Signed)
Progress Note   Patient ID: Rhonda Patton, female   DOB: [birth year], 59 y.o.   MRN: [id number]   Chief Complaint  Patient presents with  . Knee Pain    left   . Back Pain    ESIs helped but have worn off     HPI  Status post left total knee back in December she is had persistent back pain and radicular leg pain.  She had 3 ESI's did not improve pain is now worse with bilateral leg pain in the posterior aspects of the thighs and buttocks associated with back pain ROS Bowel and bladder function intact  Allergies  Allergen Reactions  . Codeine Shortness Of Breath and Nausea And Vomiting  . Aspirin Nausea Only     BP 130/85   Pulse 76   Ht 5\' 3"  (1.6 m)   Wt 266 lb (120.7 kg)   BMI 47.12 kg/m   Physical Exam Awake alert and oriented x3 mood pleasant affect flat gait supported by a cane  Her knee motion is 120 degrees and stable she has no joint pain she has pain along the lateral portion of the lower leg across the lateral knee and up into the upper part of the left leg  Medical decisions:   Data  Imaging:   MRI was done [facility] in 2016 and it was read as progressive left lateral recess narrowing at L4-5 with advanced facet disease grade 1 anterolisthesis annular disc bulge L5 impingement  Encounter Diagnoses  Name Primary?  . S/P total knee replacement, left 09/24/17   . Chronic bilateral low back pain with left-sided sciatica Yes    PLAN:   Patient is in pain management starting next week I gave her prescription to last her till then and she is failed epidural injection and now should see neurosurgery for further management of her back pain bilateral leg pain    [name], MD 06/16/2018 11:56 AM

## 2018-06-24 DIAGNOSIS — M25562 Pain in left knee: Secondary | ICD-10-CM | POA: Diagnosis not present

## 2018-06-24 DIAGNOSIS — M545 Low back pain: Secondary | ICD-10-CM | POA: Diagnosis not present

## 2018-06-24 DIAGNOSIS — G8929 Other chronic pain: Secondary | ICD-10-CM | POA: Diagnosis not present

## 2018-06-24 DIAGNOSIS — Z96652 Presence of left artificial knee joint: Secondary | ICD-10-CM | POA: Diagnosis not present

## 2018-06-24 DIAGNOSIS — M129 Arthropathy, unspecified: Secondary | ICD-10-CM | POA: Diagnosis not present

## 2018-06-24 DIAGNOSIS — Z79899 Other long term (current) drug therapy: Secondary | ICD-10-CM | POA: Diagnosis not present

## 2018-07-01 DIAGNOSIS — J060 Acute laryngopharyngitis: Secondary | ICD-10-CM | POA: Diagnosis not present

## 2018-07-01 DIAGNOSIS — J309 Allergic rhinitis, unspecified: Secondary | ICD-10-CM | POA: Diagnosis not present

## 2018-07-01 DIAGNOSIS — R05 Cough: Secondary | ICD-10-CM | POA: Diagnosis not present

## 2018-07-08 DIAGNOSIS — M25562 Pain in left knee: Secondary | ICD-10-CM | POA: Diagnosis not present

## 2018-07-08 DIAGNOSIS — Z96652 Presence of left artificial knee joint: Secondary | ICD-10-CM | POA: Diagnosis not present

## 2018-07-08 DIAGNOSIS — G8929 Other chronic pain: Secondary | ICD-10-CM | POA: Diagnosis not present

## 2018-07-08 DIAGNOSIS — Z79899 Other long term (current) drug therapy: Secondary | ICD-10-CM | POA: Diagnosis not present

## 2018-07-09 ENCOUNTER — Telehealth: Payer: Self-pay | Admitting: Orthopedic Surgery

## 2018-07-09 DIAGNOSIS — M5442 Lumbago with sciatica, left side: Principal | ICD-10-CM

## 2018-07-09 DIAGNOSIS — G8929 Other chronic pain: Secondary | ICD-10-CM

## 2018-07-09 NOTE — Telephone Encounter (Signed)
Patient called to speak with Amy - said she has not heard from neurosurgeon's office.  Please advise. [phone]

## 2018-07-12 DIAGNOSIS — I10 Essential (primary) hypertension: Secondary | ICD-10-CM | POA: Diagnosis not present

## 2018-07-12 DIAGNOSIS — R7301 Impaired fasting glucose: Secondary | ICD-10-CM | POA: Diagnosis not present

## 2018-07-12 DIAGNOSIS — G4733 Obstructive sleep apnea (adult) (pediatric): Secondary | ICD-10-CM | POA: Diagnosis not present

## 2018-07-12 NOTE — Telephone Encounter (Signed)
I missed the referral, have faxed over  Will have patient call to make appt, this speeds up process.

## 2018-07-13 NOTE — Telephone Encounter (Signed)
They called her yesterday, I have apologized for the delay. I have asked her to get the MRI scan disc to take with her, she states she will.

## 2018-07-15 DIAGNOSIS — I10 Essential (primary) hypertension: Secondary | ICD-10-CM | POA: Diagnosis not present

## 2018-07-15 DIAGNOSIS — K219 Gastro-esophageal reflux disease without esophagitis: Secondary | ICD-10-CM | POA: Diagnosis not present

## 2018-07-20 DIAGNOSIS — H2512 Age-related nuclear cataract, left eye: Secondary | ICD-10-CM | POA: Diagnosis not present

## 2018-07-20 DIAGNOSIS — H25812 Combined forms of age-related cataract, left eye: Secondary | ICD-10-CM | POA: Diagnosis not present

## 2018-08-04 DIAGNOSIS — L905 Scar conditions and fibrosis of skin: Secondary | ICD-10-CM | POA: Diagnosis not present

## 2018-08-06 DIAGNOSIS — G8929 Other chronic pain: Secondary | ICD-10-CM | POA: Diagnosis not present

## 2018-08-06 DIAGNOSIS — Z79899 Other long term (current) drug therapy: Secondary | ICD-10-CM | POA: Diagnosis not present

## 2018-08-06 DIAGNOSIS — M545 Low back pain: Secondary | ICD-10-CM | POA: Diagnosis not present

## 2018-08-06 DIAGNOSIS — M25562 Pain in left knee: Secondary | ICD-10-CM | POA: Diagnosis not present

## 2018-08-06 DIAGNOSIS — G894 Chronic pain syndrome: Secondary | ICD-10-CM | POA: Diagnosis not present

## 2018-09-10 DIAGNOSIS — Z79899 Other long term (current) drug therapy: Secondary | ICD-10-CM | POA: Diagnosis not present

## 2018-09-10 DIAGNOSIS — Z96652 Presence of left artificial knee joint: Secondary | ICD-10-CM | POA: Diagnosis not present

## 2018-09-10 DIAGNOSIS — G8929 Other chronic pain: Secondary | ICD-10-CM | POA: Diagnosis not present

## 2018-09-10 DIAGNOSIS — M25562 Pain in left knee: Secondary | ICD-10-CM | POA: Diagnosis not present

## 2018-10-15 DIAGNOSIS — M5442 Lumbago with sciatica, left side: Secondary | ICD-10-CM | POA: Diagnosis not present

## 2018-10-15 DIAGNOSIS — Z79899 Other long term (current) drug therapy: Secondary | ICD-10-CM | POA: Diagnosis not present

## 2018-10-15 DIAGNOSIS — Z96652 Presence of left artificial knee joint: Secondary | ICD-10-CM | POA: Diagnosis not present

## 2018-10-15 DIAGNOSIS — M5441 Lumbago with sciatica, right side: Secondary | ICD-10-CM | POA: Diagnosis not present

## 2018-10-15 DIAGNOSIS — M25562 Pain in left knee: Secondary | ICD-10-CM | POA: Diagnosis not present

## 2018-10-25 DIAGNOSIS — H25041 Posterior subcapsular polar age-related cataract, right eye: Secondary | ICD-10-CM | POA: Diagnosis not present

## 2018-10-25 DIAGNOSIS — H25011 Cortical age-related cataract, right eye: Secondary | ICD-10-CM | POA: Diagnosis not present

## 2018-10-25 DIAGNOSIS — H2511 Age-related nuclear cataract, right eye: Secondary | ICD-10-CM | POA: Diagnosis not present

## 2018-11-02 DIAGNOSIS — R05 Cough: Secondary | ICD-10-CM | POA: Diagnosis not present

## 2018-11-02 DIAGNOSIS — K219 Gastro-esophageal reflux disease without esophagitis: Secondary | ICD-10-CM | POA: Diagnosis not present

## 2018-11-02 DIAGNOSIS — J060 Acute laryngopharyngitis: Secondary | ICD-10-CM | POA: Diagnosis not present

## 2018-11-15 DIAGNOSIS — Z96652 Presence of left artificial knee joint: Secondary | ICD-10-CM | POA: Diagnosis not present

## 2018-11-15 DIAGNOSIS — G8929 Other chronic pain: Secondary | ICD-10-CM | POA: Diagnosis not present

## 2018-11-15 DIAGNOSIS — M25562 Pain in left knee: Secondary | ICD-10-CM | POA: Diagnosis not present

## 2018-11-15 DIAGNOSIS — Z79899 Other long term (current) drug therapy: Secondary | ICD-10-CM | POA: Diagnosis not present

## 2018-11-15 DIAGNOSIS — G894 Chronic pain syndrome: Secondary | ICD-10-CM | POA: Diagnosis not present

## 2018-11-15 IMAGING — MR MR KNEE*L* W/O CM
4 of 5 series · 20 of 40 positions shown · non-contrast
Comparison: None.

CLINICAL DATA: Left knee pain since yesterday. Surgery 6 years ago.

EXAM:
MRI OF THE LEFT KNEE WITHOUT CONTRAST
TECHNIQUE: Multiplanar, multisequence MR imaging of the knee was performed. No
intravenous contrast was administered.

[Series 4: pdfs axial blade · axial · 3.0mm · 0.50mm/px · z∈[-30,+57]mm · 6 of 30 slices shown]
[im 1/30]
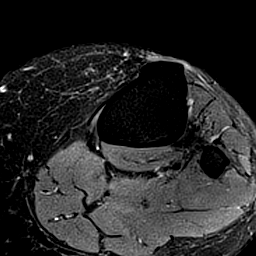
[im 5/30]
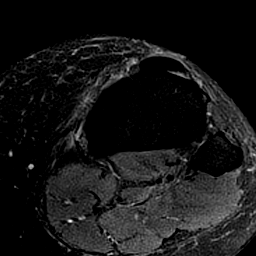
[im 9/30]
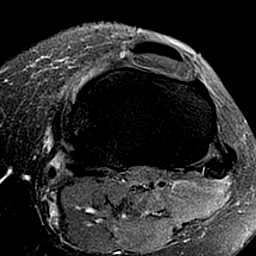
[im 13/30]
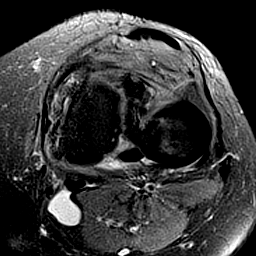
[im 17/30]
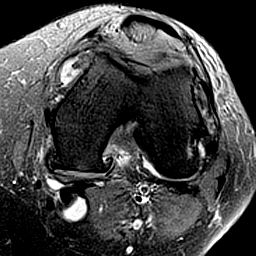
[im 25/30]
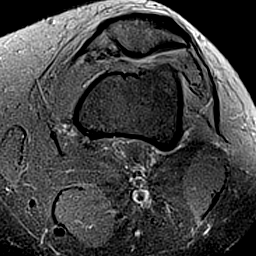

[Series 5: T1 · coronal · 3.0mm · 0.20mm/px · 8 of 30 slices shown]
[im 1/30]
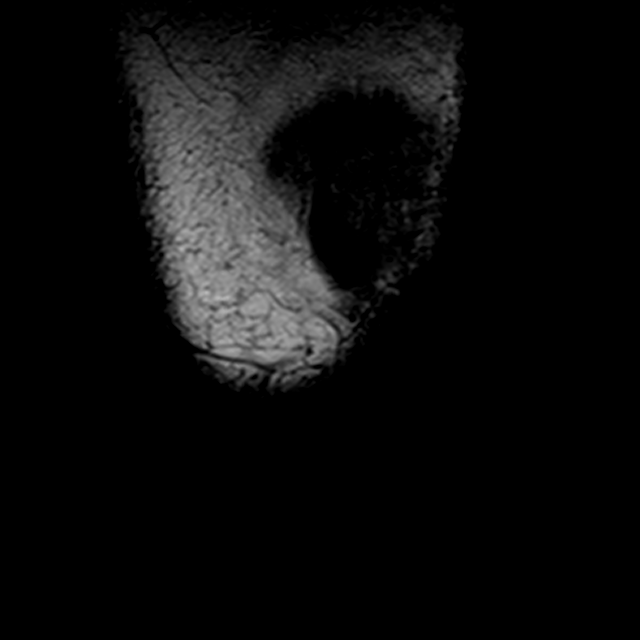
[im 5/30]
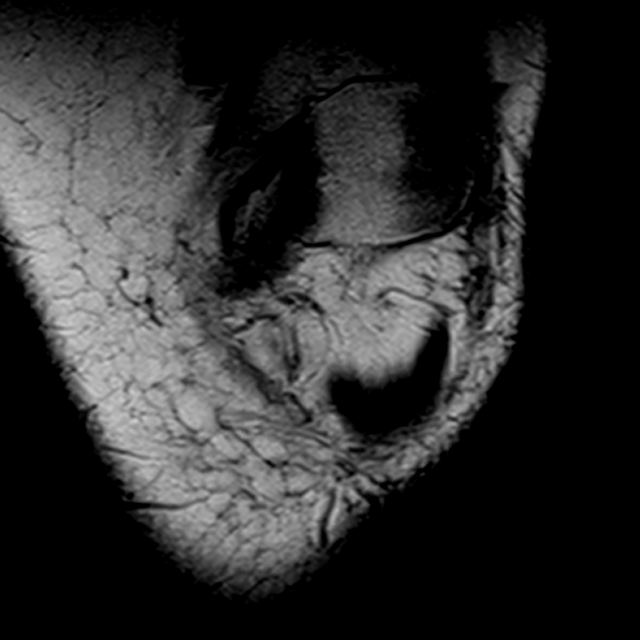
[im 9/30]
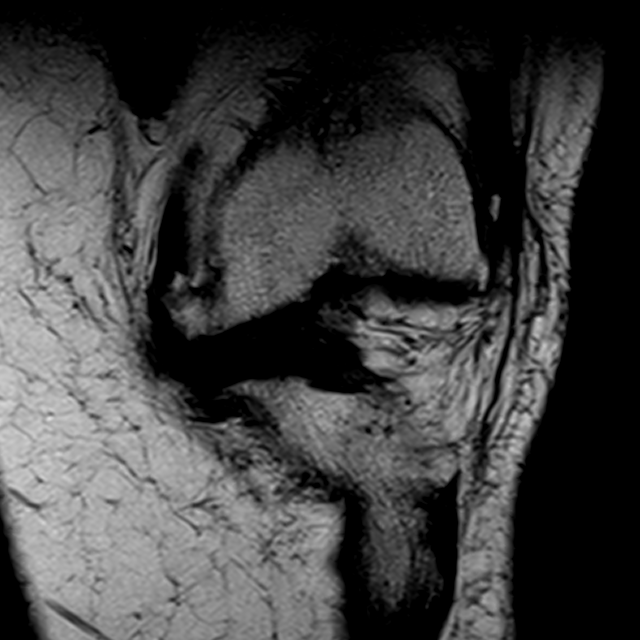
[im 13/30]
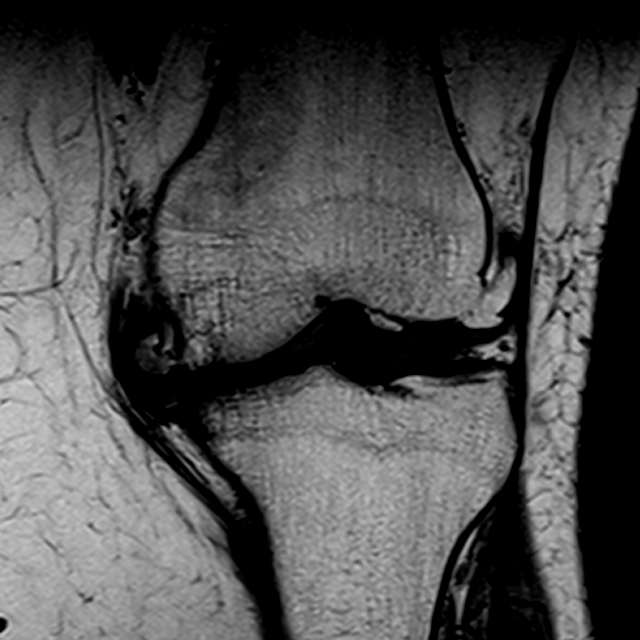
[im 17/30]
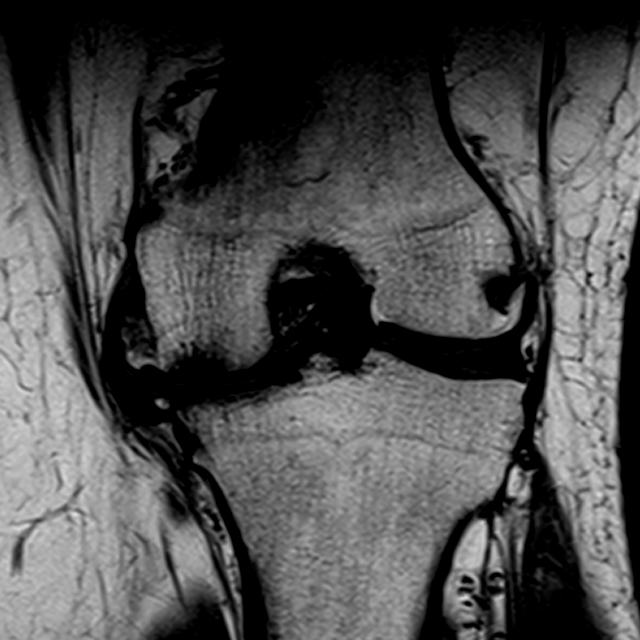
[im 21/30]
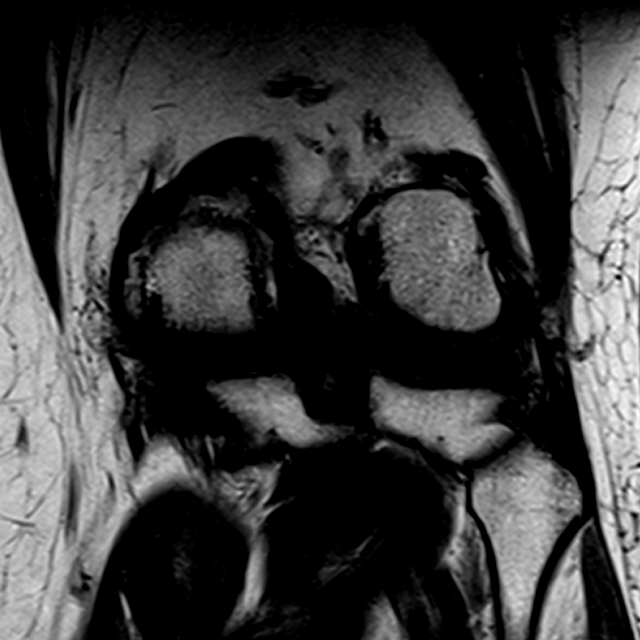
[im 25/30]
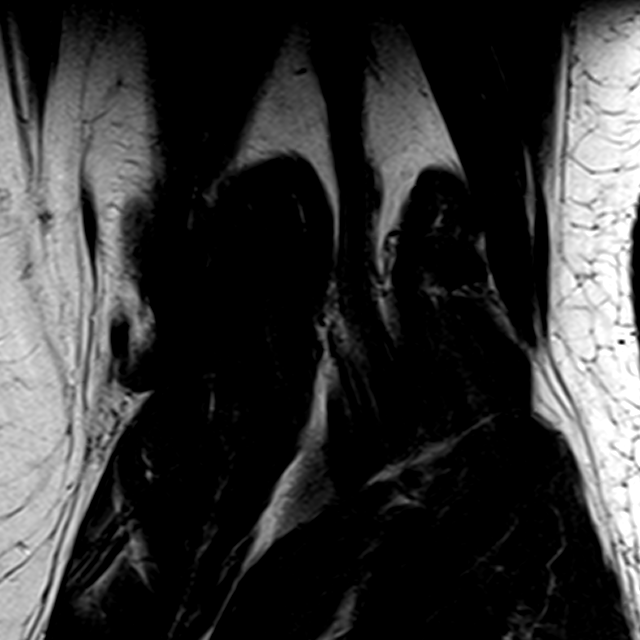
[im 30/30]
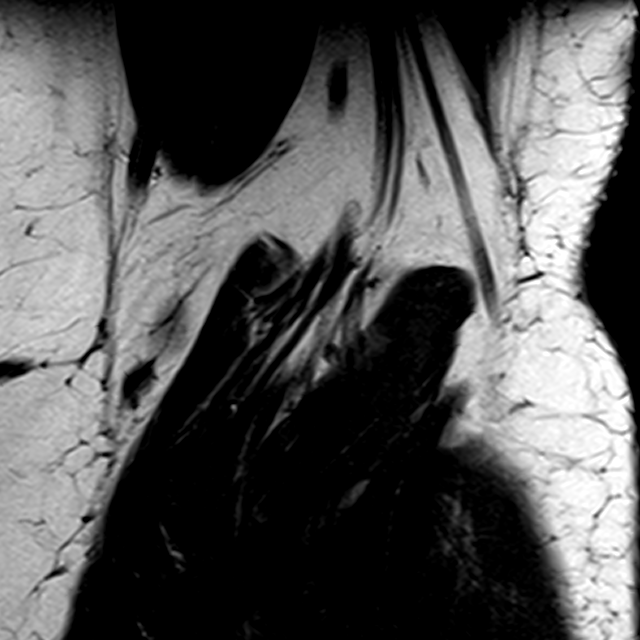

[Series 6: pdfs sag · sagittal · 3.0mm · 0.24mm/px · 3 of 32 slices shown]
[im 4/32]
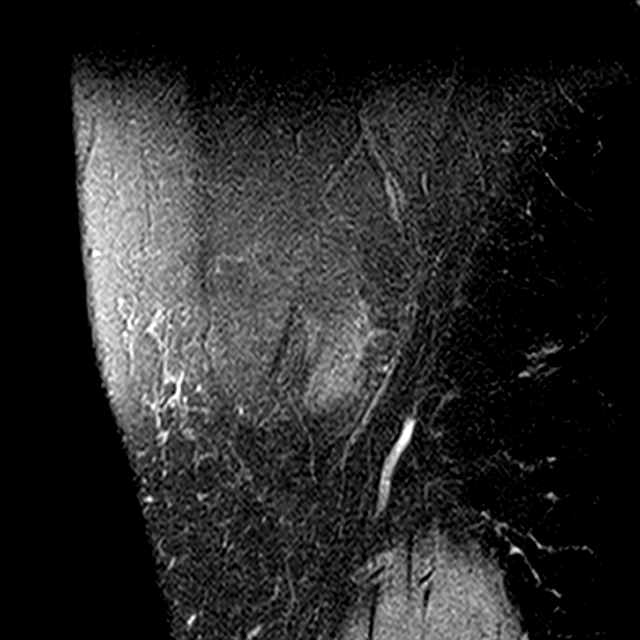
[im 16/32]
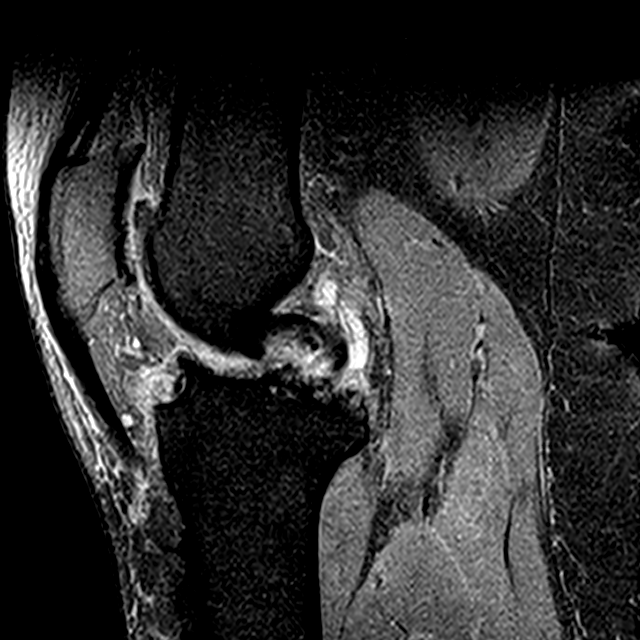
[im 28/32]
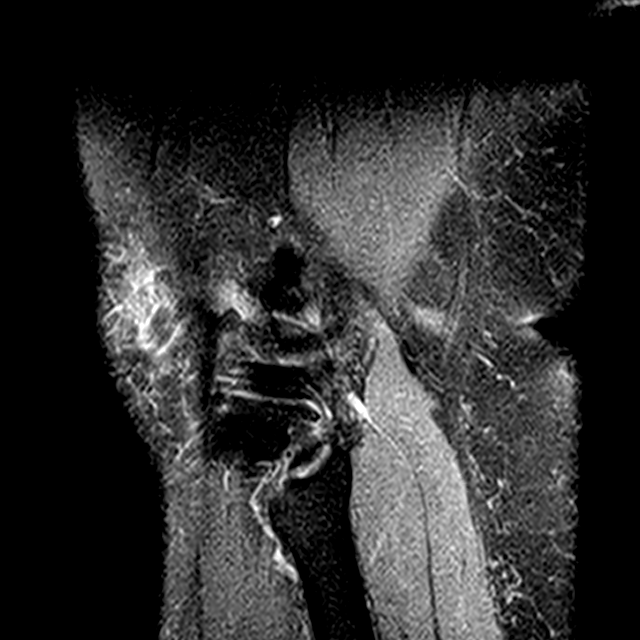

[Series 7: t2fs cor blade · coronal · 3.0mm · 0.46mm/px · 3 of 24 slices shown]
[im 4/24]
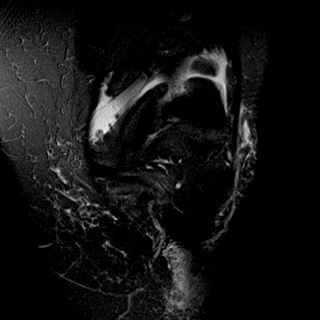
[im 12/24]
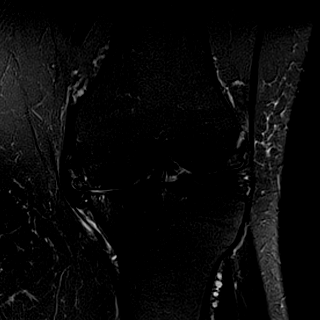
[im 20/24]
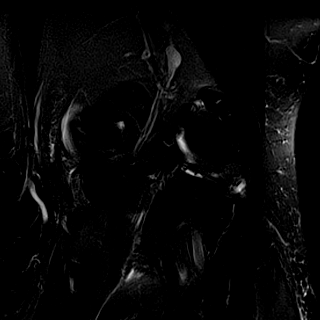

[20 of 40 positions shown; findings below may reference images not displayed]

FINDINGS: MENISCI

Medial meniscus: Severe attenuation of the posterior horn and body
of the medial meniscus consistent with prior meniscectomy versus
degenerative tear.

Lateral meniscus: Small radial tear of the free edge of the
posterior horn of the lateral meniscus.

LIGAMENTS

Cruciates:  Intact ACL and PCL.

Collaterals: Medial collateral ligament is intact. Lateral
collateral ligament complex is intact.

CARTILAGE

Patellofemoral: Partial-thickness cartilage loss of the
patellofemoral compartment.

Medial: Extensive full-thickness cartilage loss of the medial
femoral condyle and medial tibial plateau with subchondral reactive
marrow changes and marginal osteophytosis.

Lateral: Mild partial-thickness cartilage loss of the lateral
femorotibial compartment.

Joint: Small joint effusion. Normal Hoffa's fat. No plical
thickening.

Popliteal Fossa:  Small Baker cyst.  Intact popliteus tendon.

Extensor Mechanism: Intact quadriceps tendon and patellar tendon.
Intact medial and lateral patellar retinaculum. Intact MPFL.

Bones: No other marrow signal abnormality. No fracture or
dislocation.

Other: No fluid collection or hematoma.
IMPRESSION: 1. Tricompartmental cartilage abnormalities as described above most
severe in the medial femorotibial compartment consistent with severe
advanced osteoarthritis in the medial femorotibial compartment.
2. Severe attenuation of the posterior horn and body of the medial
meniscus consistent with prior meniscectomy versus degenerative
tear.
3. Small radial tear of the free edge of the posterior horn of the
lateral meniscus.

## 2018-12-04 IMAGING — US US CAROTID DUPLEX BILAT
1 series · 13 of 24 positions shown · non-contrast
Comparison: None.

CLINICAL DATA: Left eye blurriness 2 weeks ago, dizziness.
Hypertension, hyperlipidemia, pre diabetes.

EXAM:
BILATERAL CAROTID DUPLEX ULTRASOUND
TECHNIQUE: Gray scale imaging, color Doppler and duplex ultrasound was
performed of bilateral carotid and vertebral arteries in the neck.
TECHNIQUE: Quantification of carotid stenosis is based on velocity parameters
that correlate the residual internal carotid diameter with
NASCET-based stenosis levels, using the diameter of the distal
internal carotid lumen as the denominator for stenosis measurement.

[Series 1: us carotid duplex bilat · 0.06mm/px · 13 of 68 slices shown]
[im 1/68]
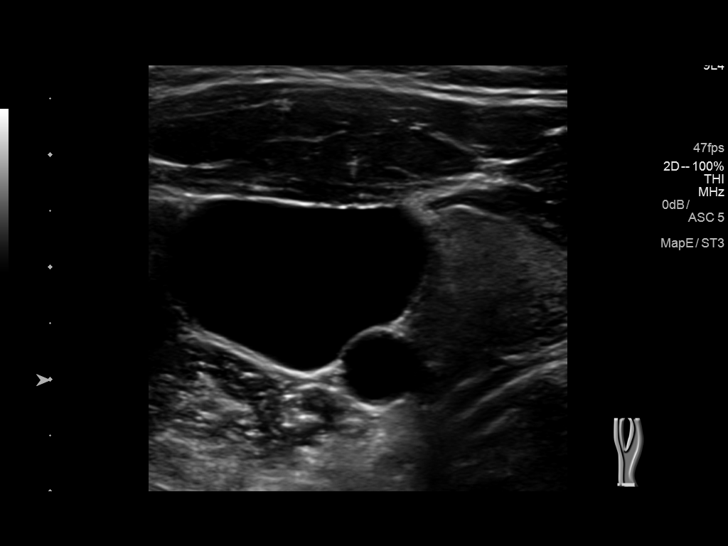
[im 6/68]
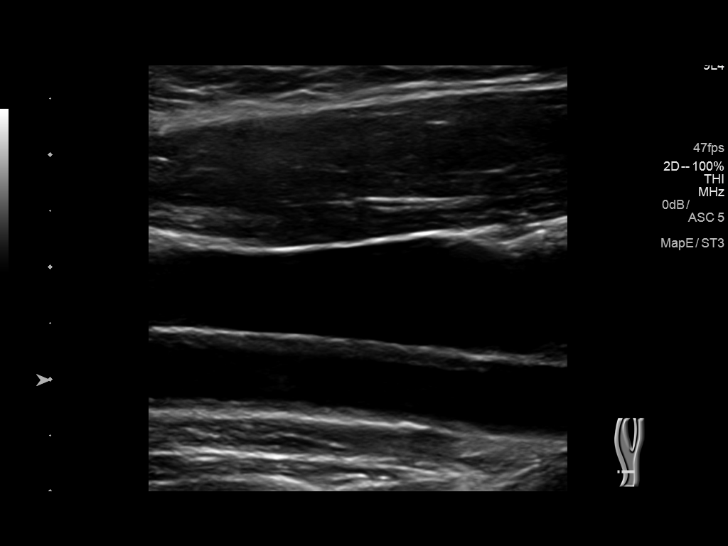
[im 12/68]
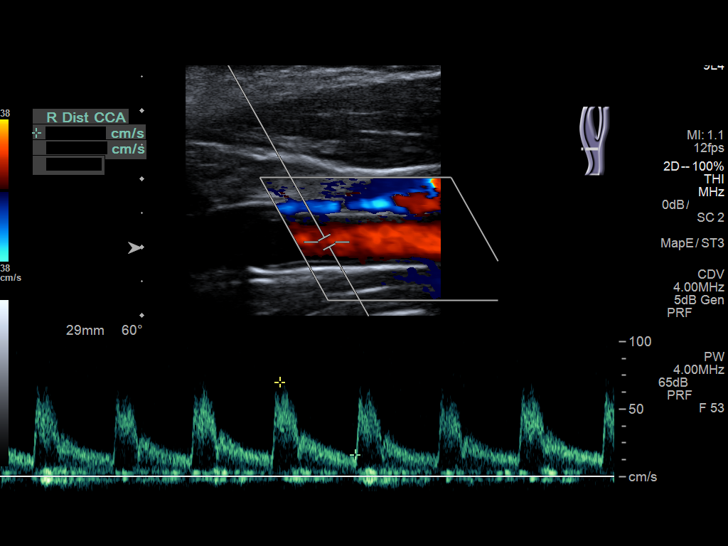
[im 18/68]
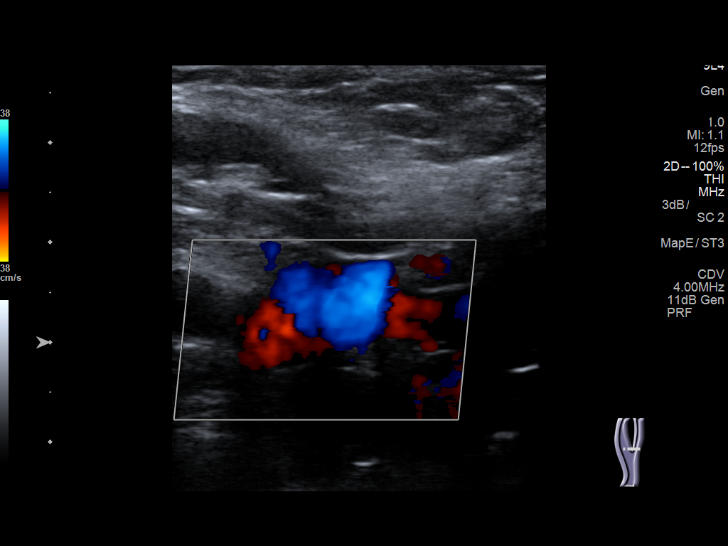
[im 24/68]
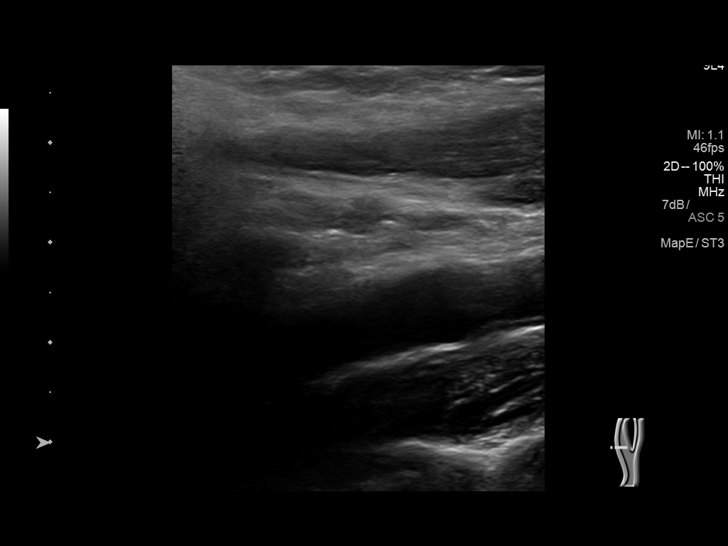
[im 30/68]
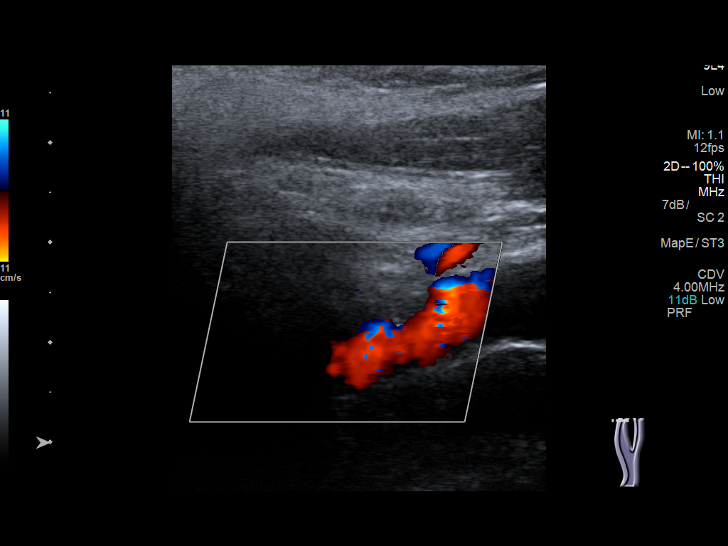
[im 35/68]
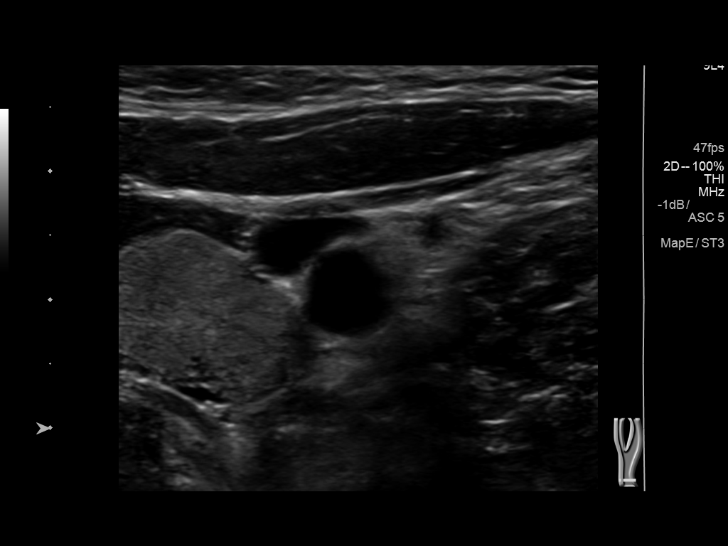
[im 38/68]
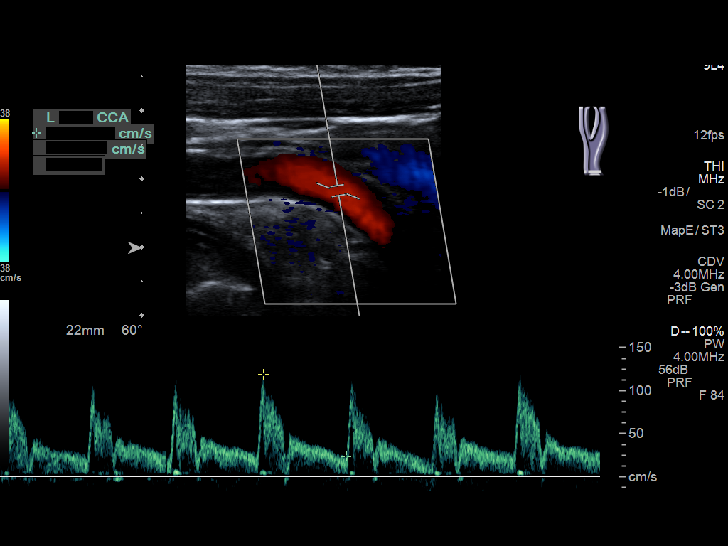
[im 44/68]
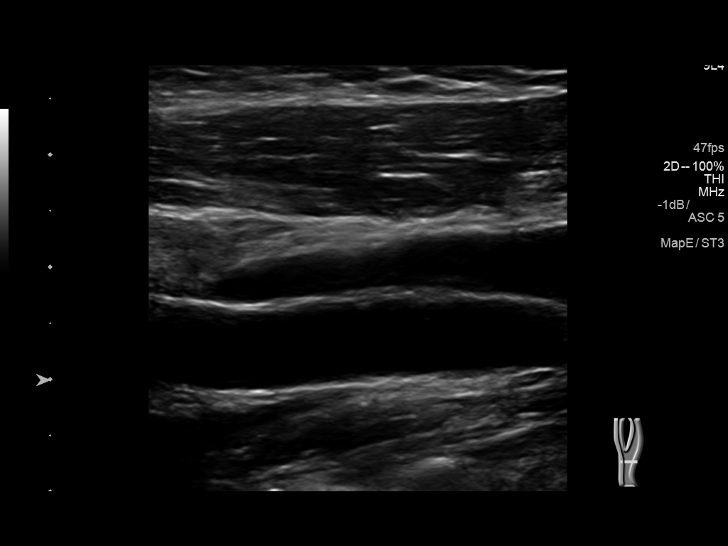
[im 50/68]
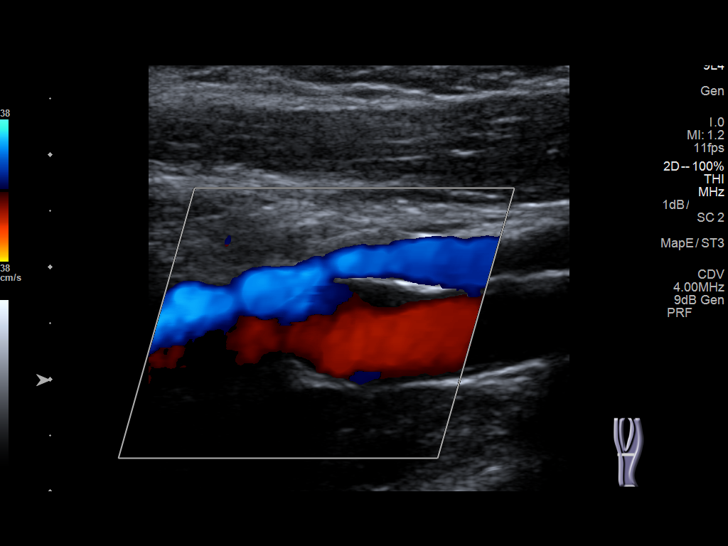
[im 56/68]
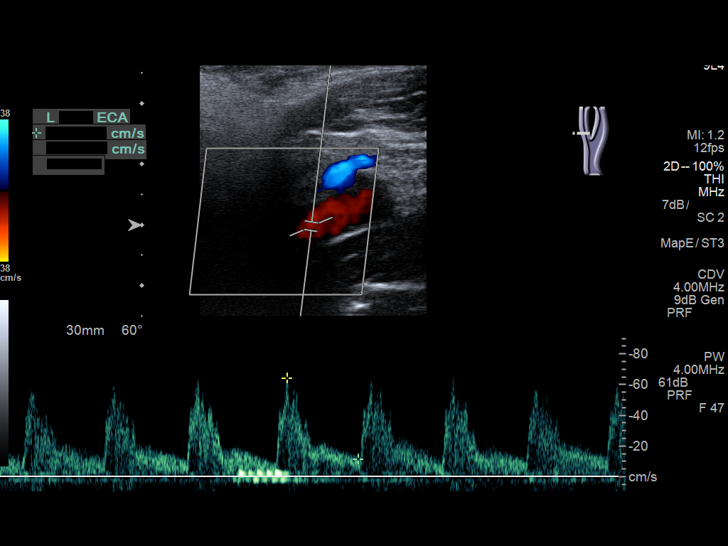
[im 62/68]
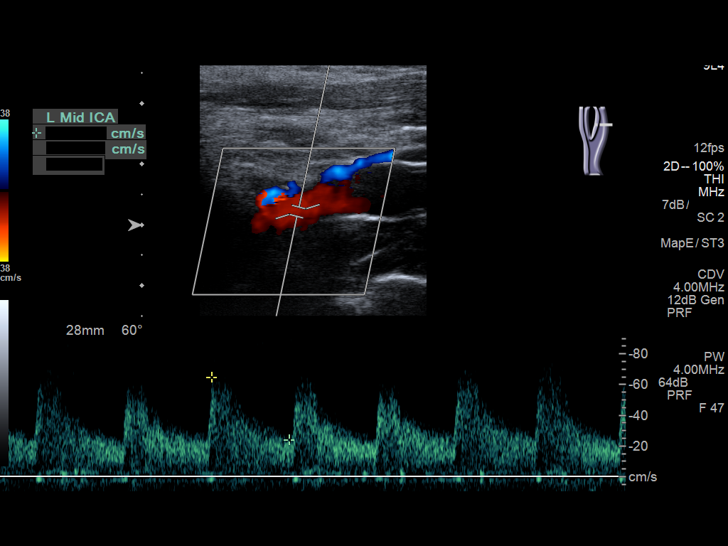
[im 68/68]
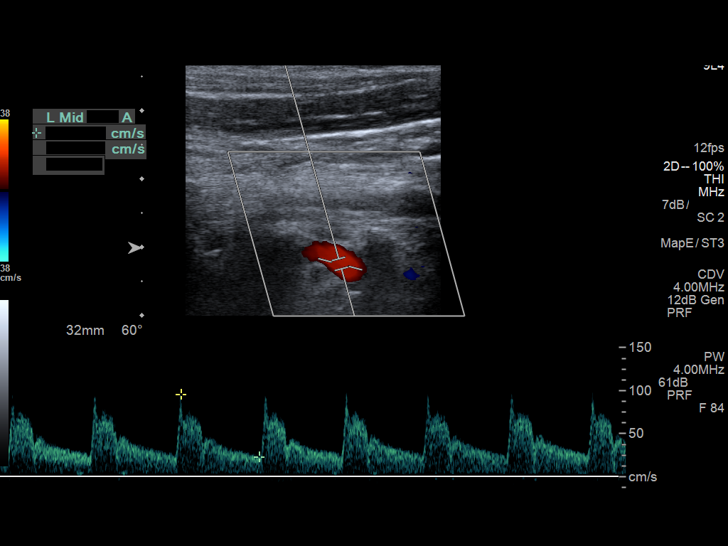

[13 of 24 positions shown; findings below may reference images not displayed]

The following velocity measurements were obtained:

PEAK SYSTOLIC/END DIASTOLIC

RIGHT

ICA:                     62/22cm/sec

CCA:                     90/22cm/sec

SYSTOLIC ICA/CCA RATIO:

ECA:                     54cm/sec

LEFT

ICA:                     71/29cm/sec

CCA:                     91/24cm/sec

SYSTOLIC ICA/CCA RATIO:

ECA:                     64cm/sec
FINDINGS: RIGHT CAROTID ARTERY: No significant plaque accumulation or
stenosis. Normal waveforms and color Doppler signal.

RIGHT VERTEBRAL ARTERY:  Normal flow direction and waveform.

LEFT CAROTID ARTERY: No significant plaque or stenosis. Normal
waveforms and color Doppler signal.

LEFT VERTEBRAL ARTERY: Normal flow direction and waveform.
IMPRESSION: Normal

## 2018-12-17 ENCOUNTER — Ambulatory Visit: Payer: BLUE CROSS/BLUE SHIELD | Admitting: Orthopedic Surgery

## 2018-12-27 DIAGNOSIS — R7301 Impaired fasting glucose: Secondary | ICD-10-CM | POA: Diagnosis not present

## 2018-12-27 DIAGNOSIS — I10 Essential (primary) hypertension: Secondary | ICD-10-CM | POA: Diagnosis not present

## 2018-12-27 DIAGNOSIS — D649 Anemia, unspecified: Secondary | ICD-10-CM | POA: Diagnosis not present

## 2018-12-30 DIAGNOSIS — E782 Mixed hyperlipidemia: Secondary | ICD-10-CM | POA: Diagnosis not present

## 2018-12-30 DIAGNOSIS — E1169 Type 2 diabetes mellitus with other specified complication: Secondary | ICD-10-CM | POA: Diagnosis not present

## 2018-12-30 DIAGNOSIS — I10 Essential (primary) hypertension: Secondary | ICD-10-CM | POA: Diagnosis not present

## 2018-12-30 DIAGNOSIS — K219 Gastro-esophageal reflux disease without esophagitis: Secondary | ICD-10-CM | POA: Diagnosis not present

## 2019-01-03 ENCOUNTER — Ambulatory Visit: Payer: Self-pay | Admitting: Orthopedic Surgery

## 2019-01-09 ENCOUNTER — Telehealth: Payer: Self-pay | Admitting: Orthopedic Surgery

## 2019-01-09 NOTE — Telephone Encounter (Signed)
BETTER   RAN OUT OF PAIN MED  CANCELLED APPT DUE TO COVID19  MAY CALL BACK FOR PAIN MEDS IF CNT GET PAIN MNGEMENT

## 2019-01-10 ENCOUNTER — Ambulatory Visit: Payer: Self-pay | Admitting: Orthopedic Surgery

## 2019-01-12 DIAGNOSIS — G8929 Other chronic pain: Secondary | ICD-10-CM | POA: Diagnosis not present

## 2019-01-12 DIAGNOSIS — Z96652 Presence of left artificial knee joint: Secondary | ICD-10-CM | POA: Diagnosis not present

## 2019-01-12 DIAGNOSIS — M25562 Pain in left knee: Secondary | ICD-10-CM | POA: Diagnosis not present

## 2019-01-12 DIAGNOSIS — G894 Chronic pain syndrome: Secondary | ICD-10-CM | POA: Diagnosis not present

## 2019-02-16 DIAGNOSIS — G894 Chronic pain syndrome: Secondary | ICD-10-CM | POA: Diagnosis not present

## 2019-02-16 DIAGNOSIS — Z79899 Other long term (current) drug therapy: Secondary | ICD-10-CM | POA: Diagnosis not present

## 2019-02-16 DIAGNOSIS — F1120 Opioid dependence, uncomplicated: Secondary | ICD-10-CM | POA: Diagnosis not present

## 2019-03-21 DIAGNOSIS — D649 Anemia, unspecified: Secondary | ICD-10-CM | POA: Diagnosis not present

## 2019-03-21 DIAGNOSIS — J309 Allergic rhinitis, unspecified: Secondary | ICD-10-CM | POA: Diagnosis not present

## 2019-03-21 DIAGNOSIS — E1169 Type 2 diabetes mellitus with other specified complication: Secondary | ICD-10-CM | POA: Diagnosis not present

## 2019-03-21 DIAGNOSIS — Z20828 Contact with and (suspected) exposure to other viral communicable diseases: Secondary | ICD-10-CM | POA: Diagnosis not present

## 2019-03-21 DIAGNOSIS — B349 Viral infection, unspecified: Secondary | ICD-10-CM | POA: Diagnosis not present

## 2019-04-11 DIAGNOSIS — E782 Mixed hyperlipidemia: Secondary | ICD-10-CM | POA: Diagnosis not present

## 2019-04-11 DIAGNOSIS — I10 Essential (primary) hypertension: Secondary | ICD-10-CM | POA: Diagnosis not present

## 2019-04-11 DIAGNOSIS — M810 Age-related osteoporosis without current pathological fracture: Secondary | ICD-10-CM | POA: Diagnosis not present

## 2019-04-11 DIAGNOSIS — E1169 Type 2 diabetes mellitus with other specified complication: Secondary | ICD-10-CM | POA: Diagnosis not present

## 2019-04-11 DIAGNOSIS — R7301 Impaired fasting glucose: Secondary | ICD-10-CM | POA: Diagnosis not present

## 2019-04-11 DIAGNOSIS — D649 Anemia, unspecified: Secondary | ICD-10-CM | POA: Diagnosis not present

## 2019-04-14 DIAGNOSIS — K219 Gastro-esophageal reflux disease without esophagitis: Secondary | ICD-10-CM | POA: Diagnosis not present

## 2019-04-14 DIAGNOSIS — E1169 Type 2 diabetes mellitus with other specified complication: Secondary | ICD-10-CM | POA: Diagnosis not present

## 2019-04-14 DIAGNOSIS — E782 Mixed hyperlipidemia: Secondary | ICD-10-CM | POA: Diagnosis not present

## 2019-04-14 DIAGNOSIS — I10 Essential (primary) hypertension: Secondary | ICD-10-CM | POA: Diagnosis not present

## 2019-04-26 IMAGING — CR DG KNEE 1-2V PORT*L*
1 series · 2 of 2 positions shown · non-contrast
Comparison: Radiographs September 14, 2017.

CLINICAL DATA: Status post left knee replacement.

EXAM:
PORTABLE LEFT KNEE - 1-2 VIEW

[Series 1: ap · 0.17mm/px · 2 of 2 slices shown]
[im 1/2]
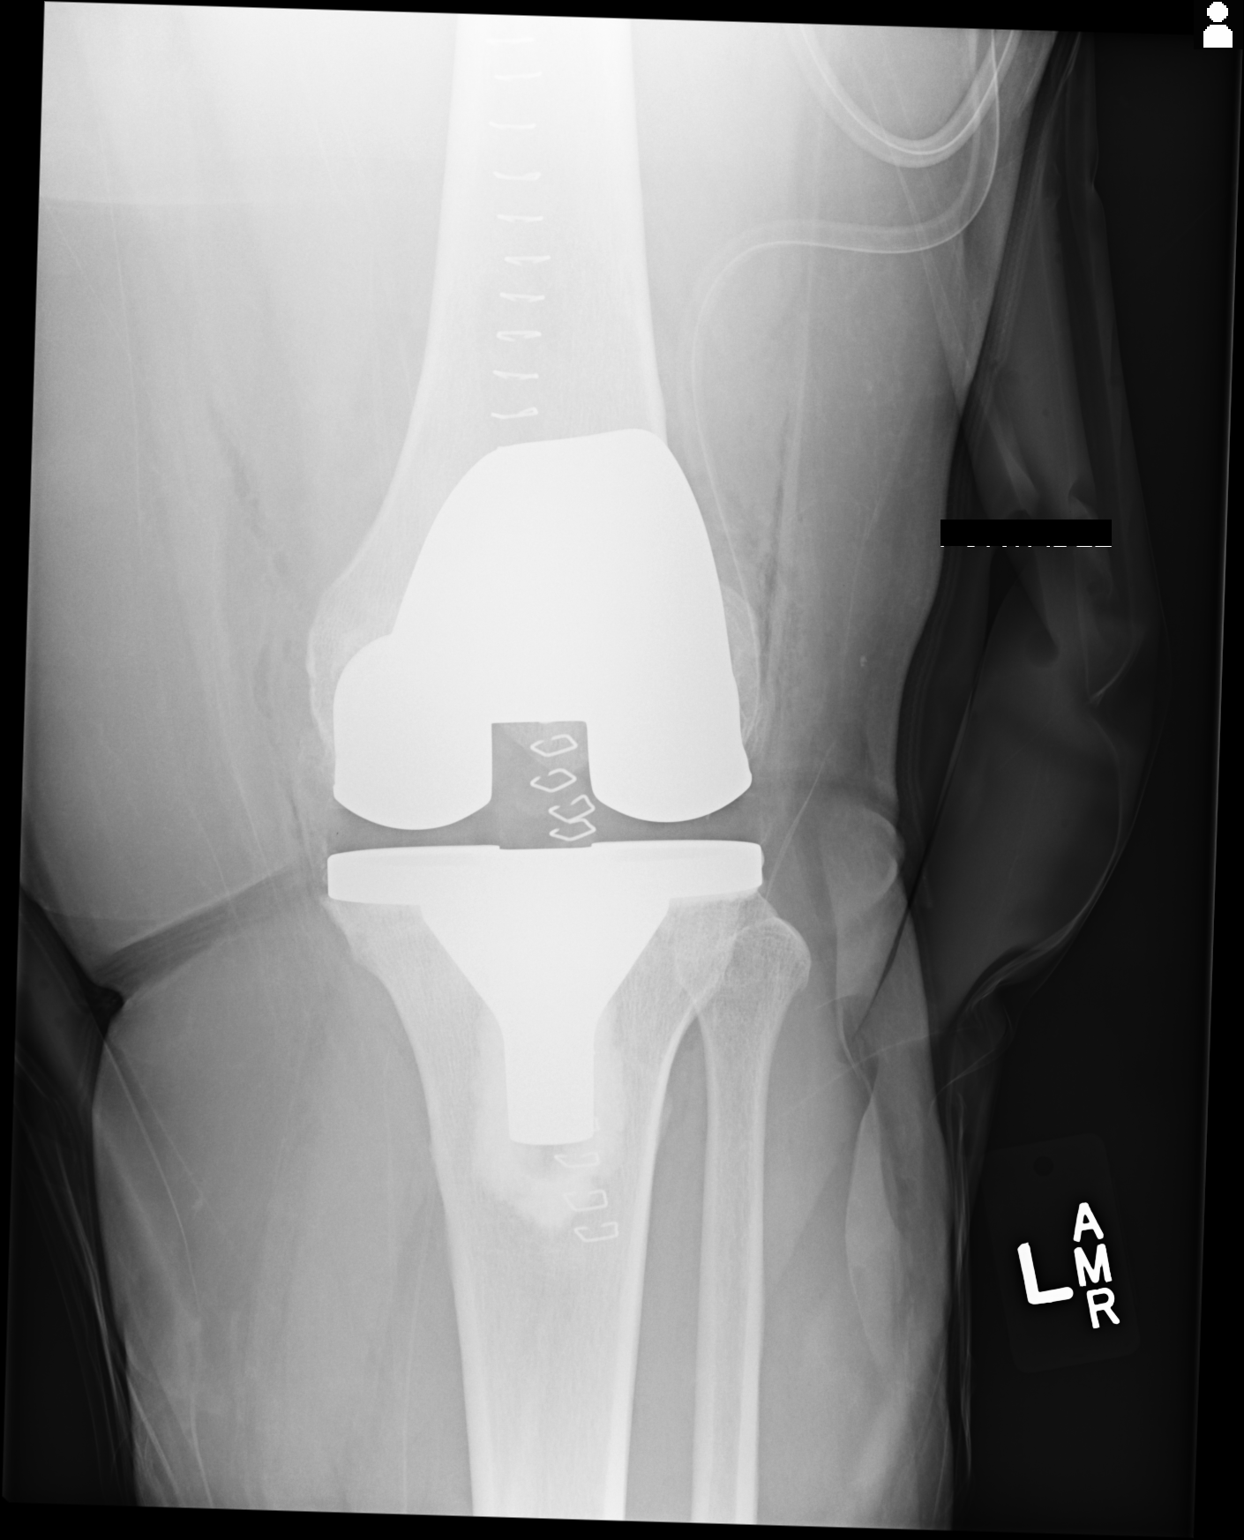
[im 2/2]
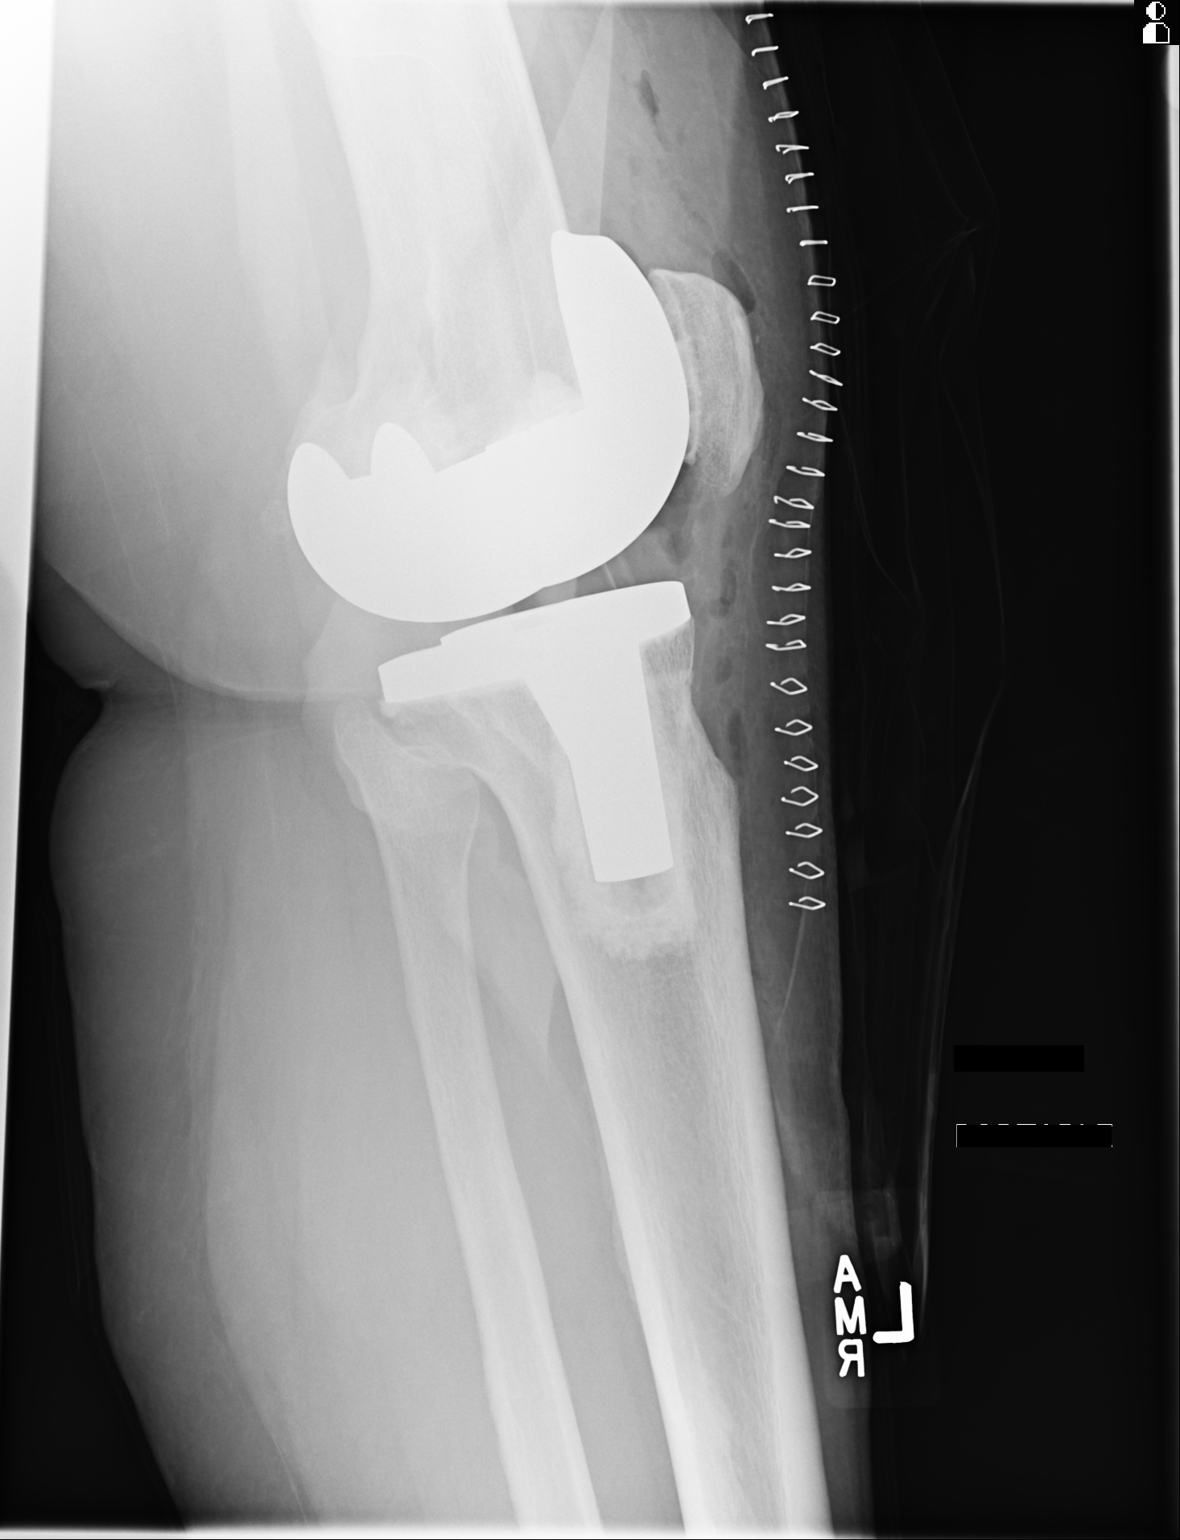

[2 of 2 positions shown; findings below may reference images not displayed]

FINDINGS: The femoral and tibial components appear to be well situated.
Expected postoperative changes are noted in the soft tissues
anteriorly. No fracture or dislocation is noted.
IMPRESSION: Status post left total knee arthroplasty.

## 2019-05-05 DIAGNOSIS — Z79899 Other long term (current) drug therapy: Secondary | ICD-10-CM | POA: Diagnosis not present

## 2019-06-20 ENCOUNTER — Ambulatory Visit: Payer: BC Managed Care – PPO

## 2019-06-20 ENCOUNTER — Other Ambulatory Visit: Payer: Self-pay

## 2019-06-20 ENCOUNTER — Ambulatory Visit: Payer: BC Managed Care – PPO | Admitting: Orthopedic Surgery

## 2019-06-20 ENCOUNTER — Ambulatory Visit (INDEPENDENT_AMBULATORY_CARE_PROVIDER_SITE_OTHER): Payer: BC Managed Care – PPO

## 2019-06-20 ENCOUNTER — Encounter: Payer: Self-pay | Admitting: Orthopedic Surgery

## 2019-06-20 VITALS — BP 126/75 | HR 74 | Temp 99.1°F | Ht 63.0 in | Wt 272.0 lb

## 2019-06-20 DIAGNOSIS — G8929 Other chronic pain: Secondary | ICD-10-CM

## 2019-06-20 DIAGNOSIS — M25561 Pain in right knee: Secondary | ICD-10-CM | POA: Diagnosis not present

## 2019-06-20 DIAGNOSIS — Z96652 Presence of left artificial knee joint: Secondary | ICD-10-CM

## 2019-06-20 NOTE — Patient Instructions (Signed)

## 2019-06-20 NOTE — Progress Notes (Signed)
Rhonda Patton  06/20/2019  HISTORY SECTION :  Chief Complaint  Patient presents with  . Routine Post Op    left knee 09/24/17 some pain persists  . Knee Pain    right knee is painful    HPI The patient presents for evaluation of left knee tka 1 yr post op (h/o lumbar disc disease, last mri 2016) c/o pain left knee replacement and new onset pain right knee.  Right knee:  Location medial joint line Duration longstanding worse over the last month Quality dull ache Severity 8 Associated with difficulty walking  Review of Systems  Constitutional: Negative for chills and fever.  Gastrointestinal: Negative.   Musculoskeletal: Positive for back pain and joint pain.     Past Medical History:  Diagnosis Date  . Angina    occasional  . Anxiety   . Arthritis   . Borderline diabetes   . Carpal tunnel syndrome of right wrist   . GERD (gastroesophageal reflux disease)   . Hypercholesteremia   . Hypertension   . Panic attack   . PONV (postoperative nausea and vomiting)   . Pre-diabetes   . Sleep apnea    diagnosed but doesn't use CPAP; never got CPAP.    Past Surgical History:  Procedure Laterality Date  . ABDOMINAL HYSTERECTOMY    . BIOPSY  08/25/2017   Procedure: BIOPSY;  Surgeon: Danie Binder, MD;  Location: [facility];  Service: Endoscopy;;  gastric biopsy, gastric polyp  . CESAREAN SECTION     x 3  . CHOLECYSTECTOMY    . COLONOSCOPY WITH PROPOFOL N/A 08/25/2017   Procedure: COLONOSCOPY WITH PROPOFOL;  Surgeon: Danie Binder, MD;  Location: [facility];  Service: Endoscopy;  Laterality: N/A;  12:15pm  . ESOPHAGOGASTRODUODENOSCOPY (EGD) WITH PROPOFOL N/A 08/25/2017   Procedure: ESOPHAGOGASTRODUODENOSCOPY (EGD) WITH PROPOFOL;  Surgeon: Danie Binder, MD;  Location: [facility];  Service: Endoscopy;  Laterality: N/A;  . FOOT SURGERY Right    x 2-bone spur  . HAND SURGERY Right    x 2-carpal tunnel  . KNEE ARTHROSCOPY Left   . KNEE ARTHROSCOPY WITH  MEDIAL MENISECTOMY Left 05/07/2017   Procedure: KNEE ARTHROSCOPY WITH MEDIAL MENISECTOMY AND LATERAL MENISECTOMY, limited debriedment;  Surgeon: Carole Civil, MD;  Location: [facility];  Service: Orthopedics;  Laterality: Left;  . PARTIAL HYSTERECTOMY  2001   total abdominal  . POLYPECTOMY  08/25/2017   Procedure: POLYPECTOMY;  Surgeon: Danie Binder, MD;  Location: [facility];  Service: Endoscopy;;  rectal polyp cs  . SAVORY DILATION N/A 08/25/2017   Procedure: SAVORY DILATION;  Surgeon: Danie Binder, MD;  Location: [facility];  Service: Endoscopy;  Laterality: N/A;  . TOTAL KNEE ARTHROPLASTY Left 09/24/2017   Procedure: TOTAL KNEE ARTHROPLASTY;  Surgeon: Carole Civil, MD;  Location: [facility];  Service: Orthopedics;  Laterality: Left;  . TRIGGER FINGER RELEASE Right 11/27/2016   Procedure: RELEASE TRIGGER FINGER/A-1 PULLEY RIGHT LONG FINGER;  Surgeon: Carole Civil, MD;  Location: [facility];  Service: Orthopedics;  Laterality: Right;  pt knows to arrive at 8:45     Allergies  Allergen Reactions  . Codeine Shortness Of Breath and Nausea And Vomiting  . Aspirin Nausea Only    MEDS: Hydrochlorothiazide, hydrocodone 5 mg, Robaxin 750 mg and Protonix 40 mg  PHYSICAL EXAM SECTION: 1) BP 126/75   Pulse 74   Ht 5\' 3"  (1.6 m)   Wt 272 lb (123.4 kg)   BMI  48.18 kg/m   Body mass index is 48.18 kg/m. General appearance: Well-developed well-nourished no gross deformities  2) Cardiovascular normal pulse and perfusion in the LOWER  extremities normal color without edema  3) Neurologically deep tendon reflexes are equal and normal, no sensation loss or deficits no pathologic reflexes  4) Psychological: Awake alert and oriented x3 mood and affect normal  5) Skin no lacerations or ulcerations no nodularity no palpable masses, no erythema or nodularity  6) Musculoskeletal:   Right knee tenderness medial joint line no effusion varus alignment Flexion 120 No  instability Normal muscle tone  Left knee midline incision healed no swelling no tenderness Flexion 115 No instability Normal muscle tone  MEDICAL DECISION SECTION:  Encounter Diagnoses  Name Primary?  . S/P total knee replacement, left 09/24/17 Yes  . Chronic pain of right knee     Imaging X-ray both knees right knee moderate to severe arthritis  Left knee stable implant  Plan:  (Rx., Inj., surg., Frx, MRI/CT, XR:2)  Inject right knee consider replacement  X-ray left knee 1 year  Procedure note right knee injection   verbal consent was obtained to inject right knee joint  Timeout was completed to confirm the site of injection  The medications used were 40 mg of Depo-Medrol and 1% lidocaine 3 cc  Anesthesia was provided by ethyl chloride and the skin was prepped with alcohol.  After cleaning the skin with alcohol a 20-gauge needle was used to inject the right knee joint. There were no complications. A sterile bandage was applied.   3:06 PM [name], MD  06/20/2019

## 2019-07-15 DIAGNOSIS — Z79899 Other long term (current) drug therapy: Secondary | ICD-10-CM | POA: Diagnosis not present

## 2019-07-15 DIAGNOSIS — M1710 Unilateral primary osteoarthritis, unspecified knee: Secondary | ICD-10-CM | POA: Diagnosis not present

## 2019-07-15 DIAGNOSIS — Z760 Encounter for issue of repeat prescription: Secondary | ICD-10-CM | POA: Diagnosis not present

## 2019-07-15 DIAGNOSIS — G894 Chronic pain syndrome: Secondary | ICD-10-CM | POA: Diagnosis not present

## 2019-07-18 DIAGNOSIS — E782 Mixed hyperlipidemia: Secondary | ICD-10-CM | POA: Diagnosis not present

## 2019-07-18 DIAGNOSIS — E1169 Type 2 diabetes mellitus with other specified complication: Secondary | ICD-10-CM | POA: Diagnosis not present

## 2019-07-18 DIAGNOSIS — D649 Anemia, unspecified: Secondary | ICD-10-CM | POA: Diagnosis not present

## 2019-07-18 DIAGNOSIS — I10 Essential (primary) hypertension: Secondary | ICD-10-CM | POA: Diagnosis not present

## 2019-07-18 DIAGNOSIS — R7301 Impaired fasting glucose: Secondary | ICD-10-CM | POA: Diagnosis not present

## 2019-07-21 DIAGNOSIS — I10 Essential (primary) hypertension: Secondary | ICD-10-CM | POA: Diagnosis not present

## 2019-07-21 DIAGNOSIS — J309 Allergic rhinitis, unspecified: Secondary | ICD-10-CM | POA: Diagnosis not present

## 2019-07-21 DIAGNOSIS — Z2821 Immunization not carried out because of patient refusal: Secondary | ICD-10-CM | POA: Diagnosis not present

## 2019-07-21 DIAGNOSIS — E119 Type 2 diabetes mellitus without complications: Secondary | ICD-10-CM | POA: Diagnosis not present

## 2019-07-21 DIAGNOSIS — E782 Mixed hyperlipidemia: Secondary | ICD-10-CM | POA: Diagnosis not present

## 2019-09-01 DIAGNOSIS — R1010 Upper abdominal pain, unspecified: Secondary | ICD-10-CM | POA: Diagnosis not present

## 2019-09-01 DIAGNOSIS — K219 Gastro-esophageal reflux disease without esophagitis: Secondary | ICD-10-CM | POA: Diagnosis not present

## 2019-10-03 DIAGNOSIS — J019 Acute sinusitis, unspecified: Secondary | ICD-10-CM | POA: Diagnosis not present

## 2019-10-03 DIAGNOSIS — J40 Bronchitis, not specified as acute or chronic: Secondary | ICD-10-CM | POA: Diagnosis not present

## 2019-10-20 DIAGNOSIS — G894 Chronic pain syndrome: Secondary | ICD-10-CM | POA: Diagnosis not present

## 2019-10-20 DIAGNOSIS — Z79899 Other long term (current) drug therapy: Secondary | ICD-10-CM | POA: Diagnosis not present

## 2019-10-20 DIAGNOSIS — M1710 Unilateral primary osteoarthritis, unspecified knee: Secondary | ICD-10-CM | POA: Diagnosis not present

## 2019-10-20 DIAGNOSIS — Z9189 Other specified personal risk factors, not elsewhere classified: Secondary | ICD-10-CM | POA: Diagnosis not present

## 2019-10-31 DIAGNOSIS — R7301 Impaired fasting glucose: Secondary | ICD-10-CM | POA: Diagnosis not present

## 2019-10-31 DIAGNOSIS — D649 Anemia, unspecified: Secondary | ICD-10-CM | POA: Diagnosis not present

## 2019-10-31 DIAGNOSIS — J209 Acute bronchitis, unspecified: Secondary | ICD-10-CM | POA: Diagnosis not present

## 2019-10-31 DIAGNOSIS — I10 Essential (primary) hypertension: Secondary | ICD-10-CM | POA: Diagnosis not present

## 2019-10-31 DIAGNOSIS — E1169 Type 2 diabetes mellitus with other specified complication: Secondary | ICD-10-CM | POA: Diagnosis not present

## 2019-10-31 DIAGNOSIS — E782 Mixed hyperlipidemia: Secondary | ICD-10-CM | POA: Diagnosis not present

## 2019-10-31 DIAGNOSIS — E119 Type 2 diabetes mellitus without complications: Secondary | ICD-10-CM | POA: Diagnosis not present

## 2019-11-03 DIAGNOSIS — R7301 Impaired fasting glucose: Secondary | ICD-10-CM | POA: Diagnosis not present

## 2019-11-03 DIAGNOSIS — B349 Viral infection, unspecified: Secondary | ICD-10-CM | POA: Diagnosis not present

## 2019-11-03 DIAGNOSIS — E1169 Type 2 diabetes mellitus with other specified complication: Secondary | ICD-10-CM | POA: Diagnosis not present

## 2019-11-03 DIAGNOSIS — I10 Essential (primary) hypertension: Secondary | ICD-10-CM | POA: Diagnosis not present

## 2019-11-03 DIAGNOSIS — D649 Anemia, unspecified: Secondary | ICD-10-CM | POA: Diagnosis not present

## 2019-11-03 DIAGNOSIS — R945 Abnormal results of liver function studies: Secondary | ICD-10-CM | POA: Diagnosis not present

## 2019-11-03 DIAGNOSIS — E119 Type 2 diabetes mellitus without complications: Secondary | ICD-10-CM | POA: Diagnosis not present

## 2019-11-03 DIAGNOSIS — E1122 Type 2 diabetes mellitus with diabetic chronic kidney disease: Secondary | ICD-10-CM | POA: Diagnosis not present

## 2020-02-13 ENCOUNTER — Ambulatory Visit: Payer: 59 | Admitting: Orthopedic Surgery

## 2020-02-13 ENCOUNTER — Encounter: Payer: Self-pay | Admitting: Orthopedic Surgery

## 2020-02-13 ENCOUNTER — Other Ambulatory Visit: Payer: Self-pay

## 2020-02-13 VITALS — BP 144/85 | HR 76 | Ht 63.0 in | Wt 266.0 lb

## 2020-02-13 DIAGNOSIS — Z6841 Body Mass Index (BMI) 40.0 and over, adult: Secondary | ICD-10-CM

## 2020-02-13 DIAGNOSIS — M65341 Trigger finger, right ring finger: Secondary | ICD-10-CM

## 2020-02-13 NOTE — Progress Notes (Addendum)
Chief Complaint  Patient presents with  . Hand Problem    locking right ring finger     59 year old female presents with locking of her right ring finger she has had a previous right long finger trigger release with good result  Review of systems she complains of pain in her legs which is chronic  No numbness or tingling noted  Past Medical History:  Diagnosis Date  . Angina    occasional  . Anxiety   . Arthritis   . Borderline diabetes   . Carpal tunnel syndrome of right wrist   . GERD (gastroesophageal reflux disease)   . Hypercholesteremia   . Hypertension   . Panic attack   . PONV (postoperative nausea and vomiting)   . Pre-diabetes   . Sleep apnea    diagnosed but doesn't use CPAP; never got CPAP.    BP (!) 144/85   Pulse 76   Ht 5\' 3"  (1.6 m)   Wt 266 lb (120.7 kg)   BMI 47.12 kg/m  The patient meets the AMA guidelines for Morbid (severe) obesity with a BMI > 40.0 and I have recommended weight loss.  She is awake alert and oriented x3 mood and affect are normal  She is ambulatory with no assistive devices  Body habitus obesity is noted.  Right hand the finger is held slight flexion is tender and swollen tenderness over the A1 pulley flexor tendons are intact neurovascular exam is normal  Encounter Diagnoses  Name Primary?  . Body mass index 45.0-49.9, adult ([place]) Yes  . Morbid obesity ([place])   . Trigger finger, right ring finger     Trigger finger injection right ring finger Trigger finger injection  Diagnosis tenosynovitis right ring finger Procedure injection A1 pulley Medications lidocaine 1% 1 mL and Depo-Medrol 40 mg 1 mL Skin prep alcohol and ethyl chloride Verbal consent was obtained Timeout confirmed the injection site  After cleaning the skin with alcohol and anesthetizing the skin with ethyl chloride the A1 pulley was palpated and the injection was performed without complication   Chronic with exacerbation plus injection

## 2020-06-18 ENCOUNTER — Ambulatory Visit: Payer: 59

## 2020-06-18 ENCOUNTER — Ambulatory Visit (INDEPENDENT_AMBULATORY_CARE_PROVIDER_SITE_OTHER): Payer: 59 | Admitting: Orthopedic Surgery

## 2020-06-18 ENCOUNTER — Other Ambulatory Visit: Payer: Self-pay

## 2020-06-18 ENCOUNTER — Encounter: Payer: Self-pay | Admitting: Orthopedic Surgery

## 2020-06-18 VITALS — BP 149/99 | HR 91 | Ht 63.0 in | Wt 266.0 lb

## 2020-06-18 DIAGNOSIS — G8929 Other chronic pain: Secondary | ICD-10-CM | POA: Diagnosis not present

## 2020-06-18 DIAGNOSIS — Z96652 Presence of left artificial knee joint: Secondary | ICD-10-CM

## 2020-06-18 DIAGNOSIS — M79671 Pain in right foot: Secondary | ICD-10-CM

## 2020-06-18 DIAGNOSIS — M25561 Pain in right knee: Secondary | ICD-10-CM

## 2020-06-18 DIAGNOSIS — M1712 Unilateral primary osteoarthritis, left knee: Secondary | ICD-10-CM

## 2020-06-18 MED ORDER — MELOXICAM 7.5 MG PO TABS: 7.5000 mg | ORAL_TABLET | Freq: Every day | ORAL | 5 refills | Status: DC

## 2020-06-18 MED ORDER — HYDROCODONE-ACETAMINOPHEN 5-325 MG PO TABS
1.0000 | ORAL_TABLET | Freq: Four times a day (QID) | ORAL | 0 refills | Status: DC | PRN
Start: 2020-06-18 — End: 2020-07-13

## 2020-06-18 NOTE — Progress Notes (Signed)
Chief Complaint  Patient presents with  . Foot Pain    right heel painful   . Knee Pain    right knee painful /// s/p left knee replacement   . Medication Refill    wants pain meds, can not go to pain management now due to insurance    3 things to address today follow-up left total knee patient seems to be doing well with that.  Surgery date was  Pain 1 month history of right heel pain when getting out of bed and putting the foot down or getting out of a chair severe pain on the plantar aspect of the heel no trauma  Chronic pain right knee worsening  Patient was in pain management her insurance changed and she can no longer go to the one in [place] she was maintained on Vicodin hydrocodone 5 mg  Past Medical History:  Diagnosis Date  . Angina    occasional  . Anxiety   . Arthritis   . Borderline diabetes   . Carpal tunnel syndrome of right wrist   . GERD (gastroesophageal reflux disease)   . Hypercholesteremia   . Hypertension   . Panic attack   . PONV (postoperative nausea and vomiting)   . Pre-diabetes   . Sleep apnea    diagnosed but doesn't use CPAP; never got CPAP.   Allergies  Allergen Reactions  . Codeine Shortness Of Breath and Nausea And Vomiting  . Aspirin Nausea Only   Past Surgical History:  Procedure Laterality Date  . ABDOMINAL HYSTERECTOMY    . BIOPSY  08/25/2017   Procedure: BIOPSY;  Surgeon: Danie Binder, MD;  [facility];  Service: Endoscopy;;  gastric biopsy, gastric polyp  . CESAREAN SECTION     x 3  . CHOLECYSTECTOMY    . COLONOSCOPY WITH PROPOFOL N/A 08/25/2017   Procedure: COLONOSCOPY WITH PROPOFOL;  Surgeon: Danie Binder, MD;  [facility];  Service: Endoscopy;  Laterality: N/A;  12:15pm  . ESOPHAGOGASTRODUODENOSCOPY (EGD) WITH PROPOFOL N/A 08/25/2017   Procedure: ESOPHAGOGASTRODUODENOSCOPY (EGD) WITH PROPOFOL;  Surgeon: Danie Binder, MD;  [facility];  Service: Endoscopy;  Laterality: N/A;  . FOOT  SURGERY Right    x 2-bone spur  . HAND SURGERY Right    x 2-carpal tunnel  . KNEE ARTHROSCOPY Left   . KNEE ARTHROSCOPY WITH MEDIAL MENISECTOMY Left 05/07/2017   Procedure: KNEE ARTHROSCOPY WITH MEDIAL MENISECTOMY AND LATERAL MENISECTOMY, limited debriedment;  Surgeon: Carole Civil, MD;  [facility];  Service: Orthopedics;  Laterality: Left;  . PARTIAL HYSTERECTOMY  2001   total abdominal  . POLYPECTOMY  08/25/2017   Procedure: POLYPECTOMY;  Surgeon: Danie Binder, MD;  [facility];  Service: Endoscopy;;  rectal polyp cs  . SAVORY DILATION N/A 08/25/2017   Procedure: SAVORY DILATION;  Surgeon: Danie Binder, MD;  [facility];  Service: Endoscopy;  Laterality: N/A;  . TOTAL KNEE ARTHROPLASTY Left 09/24/2017   Procedure: TOTAL KNEE ARTHROPLASTY;  Surgeon: Carole Civil, MD;  [facility];  Service: Orthopedics;  Laterality: Left;  . TRIGGER FINGER RELEASE Right 11/27/2016   Procedure: RELEASE TRIGGER FINGER/A-1 PULLEY RIGHT LONG FINGER;  Surgeon: Carole Civil, MD;  [facility];  Service: Orthopedics;  Laterality: Right;  pt knows to arrive at 8:45   BP (!) 149/99   Pulse 91   Ht 5\' 3"  (1.6 m)   Wt 266 lb (120.7 kg)   BMI  47.12 kg/m   The patient meets the AMA guidelines for Morbid (severe) obesity with a BMI > 40.0 and I have recommended weight loss.  Right foot is tender on the plantar aspect Achilles is intact and nontender ankle range of motion is normal ankle feels stable to drawer testing no atrophy skin is intact pulses and perfusion are normal  Pain and tenderness medial joint line right knee range of motion still intact with full extension and normal flexion  Knee feels stable skin is normal  Left total knee incision is normal healed range of motion is 120 degrees in the joint is stable muscle tone is intact  X-ray #1 left total knee stable  X-ray #2 heel spur with Achilles spur  X-ray #3 osteoarthritis right  knee  Encounter Diagnoses  Name Primary?  . Chronic pain of right knee   . Unilateral primary osteoarthritis, left knee   . Chronic heel pain, right Yes  . S/P total knee replacement, left     Start plantar fascial treatment with heel cups ice and stretching NSAID therapy as well  Pain medication for chronic right knee pain cannot do inpatient total knees at this time due to Covid  Meds ordered this encounter  Medications  . meloxicam (MOBIC) 7.5 MG tablet    Sig: Take 1 tablet (7.5 mg total) by mouth daily.    Dispense:  30 tablet    Refill:  5  . HYDROcodone-acetaminophen (NORCO/VICODIN) 5-325 MG tablet    Sig: Take 1 tablet by mouth every 6 (six) hours as needed for moderate pain.    Dispense:  30 tablet    Refill:  0

## 2020-07-02 ENCOUNTER — Encounter: Payer: Self-pay | Admitting: Internal Medicine

## 2020-07-13 ENCOUNTER — Other Ambulatory Visit: Payer: Self-pay

## 2020-07-16 MED ORDER — HYDROCODONE-ACETAMINOPHEN 5-325 MG PO TABS: 1.0000 | ORAL_TABLET | Freq: Four times a day (QID) | ORAL | 0 refills | Status: DC | PRN

## 2020-07-30 ENCOUNTER — Ambulatory Visit: Payer: 59 | Admitting: Orthopedic Surgery

## 2020-08-01 ENCOUNTER — Ambulatory Visit (INDEPENDENT_AMBULATORY_CARE_PROVIDER_SITE_OTHER): Payer: 59 | Admitting: Orthopedic Surgery

## 2020-08-01 ENCOUNTER — Encounter: Payer: Self-pay | Admitting: Orthopedic Surgery

## 2020-08-01 ENCOUNTER — Other Ambulatory Visit: Payer: Self-pay

## 2020-08-01 VITALS — BP 138/82 | HR 69 | Ht 63.0 in | Wt 260.0 lb

## 2020-08-01 DIAGNOSIS — M722 Plantar fascial fibromatosis: Secondary | ICD-10-CM | POA: Diagnosis not present

## 2020-08-01 MED ORDER — HYDROCODONE-ACETAMINOPHEN 5-325 MG PO TABS
1.0000 | ORAL_TABLET | Freq: Four times a day (QID) | ORAL | 0 refills | Status: DC | PRN
Start: 2020-08-01 — End: 2020-08-14

## 2020-08-01 NOTE — Patient Instructions (Signed)
You have received an injection of steroids into the joint. 15% of patients will have increased pain within the 24 hours postinjection.   This is transient and will go away.   We recommend that you use ice packs on the injection site for 20 minutes every 2 hours and extra strength Tylenol 2 tablets every 8 as needed until the pain resolves.  If you continue to have pain after taking the Tylenol and using the ice please call the office for further instructions.  Ice the heel  Do the foot exercises

## 2020-08-01 NOTE — Progress Notes (Signed)
Chief Complaint  Patient presents with  . Foot Pain    right     Chronic heel pain, right Yes   . S/P total knee replacement, left        Ms. Manwarren was treated with heel cups ice and stretching as well as anti-inflammatories did not improve still complains of plantar heel pain along with recurrent pain in the top of her foot and ankle  Previously seen by Dr. Caprice Beaver with plans for possible other surgeries if necessary  I am going to try an injection in the right plantar fascia if she does not improve we may have to send her for evaluation with foot and ankle specialist  I refilled her medication she is aware that she needs for chronic pain management    Meds ordered this encounter  Medications  . HYDROcodone-acetaminophen (NORCO/VICODIN) 5-325 MG tablet    Sig: Take 1 tablet by mouth every 6 (six) hours as needed for moderate pain.    Dispense:  30 tablet    Refill:  0   Procedure note Inject plantar fascia    Timeout was completed to confirm the site of injection right heel  The medications used were 40 mg of Depo-Medrol and 1% lidocaine 3 cc  Anesthesia was provided by ethyl chloride and the skin was prepped with alcohol.  After cleaning the skin with alcohol a 25-gauge needle was used to inject the plantar fascia, no complications were noted sterile bandage was applied

## 2020-08-13 NOTE — Progress Notes (Signed)
Primary Care Physician:  Celene Squibb, MD  Referring Physician: Dr. Nevada Crane  Primary Gastroenterologist:  Dr. Abbey Chatters  Chief Complaint  Patient presents with  . Abdominal Pain    general abd area, when she has pain it will last for 30 min, fruits can trigger/stress,  . Nausea    occas vomiting    HPI:   Rhonda Patton is a 59 y.o. female presenting today at the request of Dr. Nevada Crane due to abdominal pain. She notes a history of chronic abdominal pain for the past several years. Last EGD in 2018 with benign-appearing esophageal stenosis s/p dilation, small hiatal hernia, mild gastritis due to NSAIDs. Negative H.pylori. She notes improvement with dilation at that time. Last colonoscopy in 2018 with hyperplastic polyp.   Chronic abdominal pain, worsening. Triggered by fruit. Can eat grapes without pain. Stress worsens pain. Can't tolerate dairy well as it causes bloating, cramping, and diarrhea. Pain upper abdomen. Feels like cutting her up. Tries to lay down and wait it out. Pain happens about once a week and will last about an hour. Feels nauseated but no vomiting. Notes solid food dysphagia recurrent. Notes improvement after dilation historically. Protonix once daily. Pepcid at bedtime. Feels GERD is controlled. Takes Mobic daily. No unexplained weight loss. No constipation or diarrhea.    Son had pancreatitis. Nephew, niece with pancreatitis. Brother with pancreatitis. Unknown etiology.  She is worried about this as well. Has to ball up in a knot with the pain. Gets weak and can't walk. Currently without pain.     Past Medical History:  Diagnosis Date  . Angina    occasional  . Anxiety   . Arthritis   . Borderline diabetes   . Carpal tunnel syndrome of right wrist   . GERD (gastroesophageal reflux disease)   . Hypercholesteremia   . Hypertension   . Panic attack   . PONV (postoperative nausea and vomiting)   . Pre-diabetes   . Sleep apnea    diagnosed but doesn't  use CPAP; never got CPAP.    Past Surgical History:  Procedure Laterality Date  . ABDOMINAL HYSTERECTOMY    . BIOPSY  08/25/2017   Procedure: BIOPSY;  Surgeon: Danie Binder, MD;  [facility];  Service: Endoscopy;;  gastric biopsy, gastric polyp  . CESAREAN SECTION     x 3  . CHOLECYSTECTOMY    . COLONOSCOPY WITH PROPOFOL N/A 08/25/2017   TI appeared normal. 5 mm polyp in rectum. Sessile. Redundant colon. Internal hemorrhoids. Hyperplastic.   Marland Kitchen ESOPHAGOGASTRODUODENOSCOPY (EGD) WITH PROPOFOL N/A 08/25/2017   benign-apearing esophageal stenosis, dilation, small hiatal hernia, mild gastritis due to NSAIDs. Negative H.pylori.   Marland Kitchen FOOT SURGERY Right    x 2-bone spur  . HAND SURGERY Right    x 2-carpal tunnel  . KNEE ARTHROSCOPY Left   . KNEE ARTHROSCOPY WITH MEDIAL MENISECTOMY Left 05/07/2017   Procedure: KNEE ARTHROSCOPY WITH MEDIAL MENISECTOMY AND LATERAL MENISECTOMY, limited debriedment;  Surgeon: Carole Civil, MD;  [facility];  Service: Orthopedics;  Laterality: Left;  . PARTIAL HYSTERECTOMY  2001   total abdominal  . POLYPECTOMY  08/25/2017   Procedure: POLYPECTOMY;  Surgeon: Danie Binder, MD;  [facility];  Service: Endoscopy;;  rectal polyp cs  . SAVORY DILATION N/A 08/25/2017   Procedure: SAVORY DILATION;  Surgeon: Danie Binder, MD;  [facility];  Service: Endoscopy;  Laterality: N/A;  . TOTAL KNEE  ARTHROPLASTY Left 09/24/2017   Procedure: TOTAL KNEE ARTHROPLASTY;  Surgeon: Carole Civil, MD;  [facility];  Service: Orthopedics;  Laterality: Left;  . TRIGGER FINGER RELEASE Right 11/27/2016   Procedure: RELEASE TRIGGER FINGER/A-1 PULLEY RIGHT LONG FINGER;  Surgeon: Carole Civil, MD;  [facility];  Service: Orthopedics;  Laterality: Right;  pt knows to arrive at 8:45    Current Outpatient Medications  Medication Sig Dispense Refill  . famotidine (PEPCID) 40 MG tablet Take 40 mg by mouth at bedtime.    .  gabapentin (NEURONTIN) 300 MG capsule Take 300 mg by mouth 3 (three) times daily.   1  . hydrochlorothiazide (MICROZIDE) 12.5 MG capsule Take 12.5 mg by mouth daily.   1  . HYDROcodone-acetaminophen (NORCO/VICODIN) 5-325 MG tablet Take 1 tablet by mouth every 6 (six) hours as needed for moderate pain. 30 tablet 0  . meloxicam (MOBIC) 7.5 MG tablet Take 1 tablet (7.5 mg total) by mouth daily. 30 tablet 5  . metFORMIN (GLUCOPHAGE) 500 MG tablet Take 500 mg by mouth 2 (two) times daily.    . methocarbamol (ROBAXIN) 750 MG tablet Take 750 mg by mouth every 6 (six) hours as needed for muscle spasms.   1  . pantoprazole (PROTONIX) 40 MG tablet Take 40 mg by mouth daily.  2   No current facility-administered medications for this visit.    Allergies as of 08/14/2020 - Review Complete 08/14/2020  Allergen Reaction Noted  . Codeine Shortness Of Breath and Nausea And Vomiting 08/24/2014  . Aspirin Nausea Only     Family History  Problem Relation Age of Onset  . Heart disease Unknown   . Arthritis Unknown   . Cancer Unknown   . Diabetes Unknown   . Colon cancer Mother 37  . Gastric cancer Neg Hx   . Esophageal cancer Neg Hx     Social History   Socioeconomic History  . Marital status: Married    Spouse name: Not on file  . Number of children: Not on file  . Years of education: 43  . Highest education level: Not on file  Occupational History  . Not on file  Tobacco Use  . Smoking status: Never Smoker  . Smokeless tobacco: Never Used  Vaping Use  . Vaping Use: Never used  Substance and Sexual Activity  . Alcohol use: No  . Drug use: No  . Sexual activity: Not Currently    Birth control/protection: Surgical  Other Topics Concern  . Not on file  Social History Narrative  . Not on file   Social Determinants of Health   Financial Resource Strain:   . Difficulty of Paying Living Expenses: Not on file  Food Insecurity:   . Worried About Charity fundraiser in the Last Year: Not  on file  . Ran Out of Food in the Last Year: Not on file  Transportation Needs:   . Lack of Transportation (Medical): Not on file  . Lack of Transportation (Non-Medical): Not on file  Physical Activity:   . Days of Exercise per Week: Not on file  . Minutes of Exercise per Session: Not on file  Stress:   . Feeling of Stress : Not on file  Social Connections:   . Frequency of Communication with Friends and Family: Not on file  . Frequency of Social Gatherings with Friends and Family: Not on file  . Attends Religious Services: Not on file  . Active Member of Clubs or Organizations:  Not on file  . Attends Archivist Meetings: Not on file  . Marital Status: Not on file  Intimate Partner Violence:   . Fear of Current or Ex-Partner: Not on file  . Emotionally Abused: Not on file  . Physically Abused: Not on file  . Sexually Abused: Not on file    Review of Systems: Gen: see HPI CV: +palpitations Resp: +DOE GI: see HPI GU : Denies urinary burning, urinary frequency, urinary hesitancy MS: +joint pain  Derm: Denies rash, itching, dry skin Psych: Denies depression, anxiety, memory loss, and confusion Heme: Denies bruising, bleeding, and enlarged lymph nodes.  Physical Exam: BP 132/80   Pulse 68   Temp (!) 97.3 F (36.3 C)   Ht 5\' 3"  (1.6 m)   Wt 260 lb (117.9 kg)   BMI 46.06 kg/m  General:   Alert and oriented. Pleasant and cooperative. Well-nourished and well-developed.  Head:  Normocephalic and atraumatic. Eyes:  Without icterus, sclera clear and conjunctiva pink.  Ears:  Normal auditory acuity. Mouth:  No deformity or lesions, oral mucosa pink.  Lungs:  Clear to auscultation bilaterally. No wheezes, rales, or rhonchi. No distress.  Heart:  S1, S2 present without murmurs appreciated.  Abdomen:  +BS, soft, mild TTP upper abdomen and non-distended. No HSM noted. No guarding or rebound. No masses appreciated.  Rectal:  Deferred  Msk:  Symmetrical without gross  deformities. Normal posture. Extremities:  Without edema. Neurologic:  Alert and  oriented x4;  grossly normal neurologically. Skin:  Intact without significant lesions or rashes. Psych:  Alert and cooperative. Normal mood and affect.  ASSESSMENT: Rhonda Patton is a 59 y.o. female presenting today with chronic abdominal pain, intermittent in nature, now worsening recently. Known history of NSAID-induced gastritis, with last EGD in 2018. No alarm signs/symptoms at time of visit and currently not in pain.   She notes concern as multiple family members have history of pancreatitis, and she is unsure the etiology. We discussed if she has recurrent acute pain, she is to call and we will pursue stat labs and imaging at that time. As she is noting recurrent solid food dysphagia and has noted improvement with dilation historically, we will pursue EGD/dilation in the interim.  I do note that stress worsens pain, along with certain food triggers. Gallbladder is absent. She has had no weight loss, fear of eating, etc., so chronic mesenteric ischemia is low on differentials.    PLAN:  Proceed with upper endoscopy/dilation by Dr. Abbey Chatters in near future: the risks, benefits, and alternatives have been discussed with the patient in detail. The patient states understanding and desires to proceed.   Continue PPI daily  Call if recurrent abdominal pain and will order stat CBC, CMP, lipase, and CT depending on symptoms  3-4 month follow-up  Annitta Needs, PhD, ANP-BC [facility]

## 2020-08-14 ENCOUNTER — Other Ambulatory Visit: Payer: Self-pay

## 2020-08-14 ENCOUNTER — Encounter: Payer: Self-pay | Admitting: Gastroenterology

## 2020-08-14 ENCOUNTER — Ambulatory Visit (INDEPENDENT_AMBULATORY_CARE_PROVIDER_SITE_OTHER): Payer: 59 | Admitting: Gastroenterology

## 2020-08-14 VITALS — BP 132/80 | HR 68 | Temp 97.3°F | Ht 63.0 in | Wt 260.0 lb

## 2020-08-14 DIAGNOSIS — R1013 Epigastric pain: Secondary | ICD-10-CM

## 2020-08-14 DIAGNOSIS — R1310 Dysphagia, unspecified: Secondary | ICD-10-CM

## 2020-08-14 NOTE — Patient Instructions (Signed)
We are arranging an upper endoscopy with dilation in the near future.  Please call when you have an episode of pain again so we can check blood work and imaging if needed.  We will see you in 3-4 months!   It was a pleasure to see you today. I want to create trusting relationships with patients to provide genuine, compassionate, and quality care. I value your feedback. If you receive a survey regarding your visit,  I greatly appreciate you taking time to fill this out.   Annitta Needs, PhD, ANP-BC [facility]

## 2020-08-15 MED ORDER — HYDROCODONE-ACETAMINOPHEN 5-325 MG PO TABS
1.0000 | ORAL_TABLET | Freq: Four times a day (QID) | ORAL | 0 refills | Status: DC | PRN
Start: 2020-08-15 — End: 2020-09-02

## 2020-09-02 ENCOUNTER — Other Ambulatory Visit: Payer: Self-pay

## 2020-09-03 MED ORDER — HYDROCODONE-ACETAMINOPHEN 5-325 MG PO TABS: 1.0000 | ORAL_TABLET | Freq: Four times a day (QID) | ORAL | 0 refills | Status: DC | PRN

## 2020-09-05 ENCOUNTER — Other Ambulatory Visit: Payer: Self-pay

## 2020-09-05 ENCOUNTER — Ambulatory Visit (INDEPENDENT_AMBULATORY_CARE_PROVIDER_SITE_OTHER): Payer: 59 | Admitting: Orthopedic Surgery

## 2020-09-05 ENCOUNTER — Encounter: Payer: Self-pay | Admitting: Orthopedic Surgery

## 2020-09-05 VITALS — BP 156/86 | HR 68 | Ht 63.0 in | Wt 256.0 lb

## 2020-09-05 DIAGNOSIS — E11610 Type 2 diabetes mellitus with diabetic neuropathic arthropathy: Secondary | ICD-10-CM | POA: Diagnosis not present

## 2020-09-05 DIAGNOSIS — Z6841 Body Mass Index (BMI) 40.0 and over, adult: Secondary | ICD-10-CM

## 2020-09-05 DIAGNOSIS — M722 Plantar fascial fibromatosis: Secondary | ICD-10-CM

## 2020-09-05 NOTE — Patient Instructions (Signed)
Limit weight bearing   Wear boot for ambulation   Referral appointment to DR Sharol Given

## 2020-09-05 NOTE — Progress Notes (Signed)
Chief Complaint  Patient presents with  . Follow-up    Right Foot, feels worse pain on top   Ms. Vidas was treated with heel cups ice and stretching as well as anti-inflammatories did not improve still complains of plantar heel pain along with recurrent pain in the top of her foot and ankle, which is now worse with the entire foot swollen and painful   Exam  BP (!) 156/86   Pulse 68   Ht 5\' 3"  (1.6 m)   Wt 256 lb (116.1 kg)   BMI 45.35 kg/m   The patient meets the AMA guidelines for Morbid (severe) obesity with a BMI > 40.0 and I have recommended weight loss.  Right foot  Warm swollen foot tender diffusely no gross deformity    REC: CAM WALKER SHORT   Encounter Diagnoses  Name Primary?  . Plantar fasciitis right Yes  . Diabetic Charcot's foot ([place]) ???       REFER DR DUDA

## 2020-09-05 NOTE — Addendum Note (Signed)
Addended byCandice Camp on: 09/05/2020 10:58 AM   Modules accepted: Orders

## 2020-09-11 ENCOUNTER — Ambulatory Visit (INDEPENDENT_AMBULATORY_CARE_PROVIDER_SITE_OTHER): Payer: 59 | Admitting: Orthopedic Surgery

## 2020-09-11 ENCOUNTER — Encounter: Payer: Self-pay | Admitting: Orthopedic Surgery

## 2020-09-11 DIAGNOSIS — M6701 Short Achilles tendon (acquired), right ankle: Secondary | ICD-10-CM

## 2020-09-11 DIAGNOSIS — M722 Plantar fascial fibromatosis: Secondary | ICD-10-CM

## 2020-09-11 NOTE — Progress Notes (Signed)
Office Visit Note   Patient: Rhonda Patton           Date of Birth: 1961-05-15           MRN: [id number] Visit Date: 09/11/2020              Requested by: Carole Civil, MD [id number] [place],   [id number] PCP: Celene Squibb, MD  Chief Complaint  Patient presents with  . Right Foot - Pain      HPI: Patient is a 59 year old woman who is seen for initial evaluation for plantar fasciitis on the right.  Patient states she has had symptoms for 3 months she has worked on stretching exercising immobilization and injections with Dr. Aline Brochure and still has pain with activities of daily living she states she has pain with start up pain at night pain with prolonged activities.  Assessment & Plan: Visit Diagnoses:  1. Achilles tendon contracture, right   2. Plantar fasciitis of right foot     Plan: Discussed with the patient a surgical option would be to proceed with a gastrocnemius recession and plantar fascial release discussed that this should be helpful about 75% of the time.  Risks and benefits were discussed including infection neurovascular injury persistent pain need for additional surgery.  Patient states she understands and wished to proceed with surgery recommended a cushioned shoe such as a Hoka to help unload her plantar fascia.  Follow-Up Instructions: Return if symptoms worsen or fail to improve, for Follow-up 1 week after surgery.Manson Passey Exam  Patient is alert, oriented, no adenopathy, well-dressed, normal affect, normal respiratory effort. Examination patient has a palpable posterior tibial and dorsalis pedis pulse the Doppler was used and she has a triphasic dorsalis pedis and posterior tibial pulse.  She does have an old incision over the midfoot and she states that bone spur was resected she has subjective numbness in the first webspace.  Examination her Achilles tendon is intact with her knee extended she has dorsiflexion only to neutral and this  is painful along the Achilles.  She also has pain to palpation of the origin of the plantar fascia there are no nodules no signs of infection.  Imaging: No results found. No images are attached to the encounter.  Labs: Lab Results  Component Value Date   HGBA1C 5.9 (H) 08/20/2017   LABORGA Insignificant Growth 08/24/2014     Lab Results  Component Value Date   ALBUMIN 4.4 09/22/2017   ALBUMIN 3.8 08/24/2014    No results found for: MG No results found for: VD25OH  No results found for: PREALBUMIN CBC EXTENDED Latest Ref Rng & Units 09/27/2017 09/26/2017 09/25/2017  WBC 4.0 - 10.5 K/uL 9.0 9.7 7.2  RBC 3.87 - 5.11 MIL/uL 3.97 4.08 4.16  HGB 12.0 - 15.0 g/dL 11.4(L) 11.6(L) 11.7(L)  HCT 36 - 46 % 35.0(L) 35.9(L) 36.7  PLT 150 - 400 K/uL 169 173 176  NEUTROABS 1.7 - 7.7 K/uL - - -  LYMPHSABS 0.7 - 4.0 K/uL - - -     There is no height or weight on file to calculate BMI.  Orders:  No orders of the defined types were placed in this encounter.  No orders of the defined types were placed in this encounter.    Procedures: No procedures performed  Clinical Data: No additional findings.  ROS:  All other systems negative, except as noted in the HPI. Review of Systems  Objective: Vital Signs:  There were no vitals taken for this visit.  Specialty Comments:  No specialty comments available.  PMFS History: Patient Active Problem List   Diagnosis Date Noted  . S/P total knee replacement, left 09/24/17 09/24/2017  . Rectal polyp   . Gastritis due to nonsteroidal anti-inflammatory drug   . Stricture and stenosis of esophagus   . GERD (gastroesophageal reflux disease) 07/28/2017  . Rectal bleeding 07/28/2017  . Dysphagia 07/28/2017  . Abdominal pain, epigastric 07/28/2017  . Trigger finger, acquired   . OA (osteoarthritis) of knee 10/07/2012  . Left leg weakness 03/17/2012  . Difficulty in walking(719.7) 03/17/2012  . CARPAL TUNNEL SYNDROME 05/01/2010  . MITRAL  REGURGITATION 07/20/2009  . AORTIC INSUFFICIENCY 07/20/2009  . SHORTNESS OF BREATH 06/20/2009  . CHEST PAIN-UNSPECIFIED 05/31/2009   Past Medical History:  Diagnosis Date  . Angina    occasional  . Anxiety   . Arthritis   . Carpal tunnel syndrome of right wrist   . Diabetes ([place])   . GERD (gastroesophageal reflux disease)   . Hypercholesteremia   . Hypertension   . Panic attack   . PONV (postoperative nausea and vomiting)   . Sleep apnea    diagnosed but doesn't use CPAP; never got CPAP.    Family History  Problem Relation Age of Onset  . Heart disease Other   . Arthritis Other   . Cancer Other   . Diabetes Other   . Colon cancer Mother 53  . Gastric cancer Neg Hx   . Esophageal cancer Neg Hx     Past Surgical History:  Procedure Laterality Date  . ABDOMINAL HYSTERECTOMY    . BIOPSY  08/25/2017   Procedure: BIOPSY;  Surgeon: Danie Binder, MD;  [facility];  Service: Endoscopy;;  gastric biopsy, gastric polyp  . CESAREAN SECTION     x 3  . CHOLECYSTECTOMY    . COLONOSCOPY WITH PROPOFOL N/A 08/25/2017   TI appeared normal. 5 mm polyp in rectum. Sessile. Redundant colon. Internal hemorrhoids. Hyperplastic.   Marland Kitchen ESOPHAGOGASTRODUODENOSCOPY (EGD) WITH PROPOFOL N/A 08/25/2017   benign-apearing esophageal stenosis, dilation, small hiatal hernia, mild gastritis due to NSAIDs. Negative H.pylori.   Marland Kitchen FOOT SURGERY Right    x 2-bone spur  . HAND SURGERY Right    x 2-carpal tunnel  . KNEE ARTHROSCOPY Left   . KNEE ARTHROSCOPY WITH MEDIAL MENISECTOMY Left 05/07/2017   Procedure: KNEE ARTHROSCOPY WITH MEDIAL MENISECTOMY AND LATERAL MENISECTOMY, limited debriedment;  Surgeon: Carole Civil, MD;  [facility];  Service: Orthopedics;  Laterality: Left;  . PARTIAL HYSTERECTOMY  2001   total abdominal  . POLYPECTOMY  08/25/2017   Procedure: POLYPECTOMY;  Surgeon: Danie Binder, MD;  [facility];  Service: Endoscopy;;  rectal polyp cs  . SAVORY  DILATION N/A 08/25/2017   Procedure: SAVORY DILATION;  Surgeon: Danie Binder, MD;  [facility];  Service: Endoscopy;  Laterality: N/A;  . TOTAL KNEE ARTHROPLASTY Left 09/24/2017   Procedure: TOTAL KNEE ARTHROPLASTY;  Surgeon: Carole Civil, MD;  [facility];  Service: Orthopedics;  Laterality: Left;  . TRIGGER FINGER RELEASE Right 11/27/2016   Procedure: RELEASE TRIGGER FINGER/A-1 PULLEY RIGHT LONG FINGER;  Surgeon: Carole Civil, MD;  [facility]: [facility];  Service: Orthopedics;  Laterality: Right;  pt knows to arrive at 8:45   Social History   Occupational History  . Not on file  Tobacco Use  . Smoking status: Never Smoker  . Smokeless  tobacco: Never Used  Vaping Use  . Vaping Use: Never used  Substance and Sexual Activity  . Alcohol use: No  . Drug use: No  . Sexual activity: Not Currently    Birth control/protection: Surgical

## 2020-09-14 ENCOUNTER — Other Ambulatory Visit: Payer: Self-pay

## 2020-09-17 ENCOUNTER — Encounter: Payer: Self-pay | Admitting: Orthopedic Surgery

## 2020-09-17 MED ORDER — HYDROCODONE-ACETAMINOPHEN 5-325 MG PO TABS: 1.0000 | ORAL_TABLET | Freq: Four times a day (QID) | ORAL | 0 refills | Status: DC | PRN

## 2020-09-17 NOTE — Telephone Encounter (Signed)
Time to go to pain [facility]

## 2020-10-05 NOTE — Patient Instructions (Signed)
Rhonda Patton  10/05/2020     @PREFPERIOPPHARMACY @   Your procedure is scheduled on  10/09/2020.  Report to [facility] at  0930  A.M.  Call this number if you have problems the morning of surgery:  [phone]   Remember:  Follow the diet instructions given to you by the office.                     Take these medicines the morning of surgery with A SIP OF WATER  Gabapentin, hydrocodone(if needed), mobic(if needed), robaxin(if needed), protonix    Do not wear jewelry, make-up or nail polish.  Do not wear lotions, powders, or perfumes. Please wear deodorant and brush your teeth.  Do not shave 48 hours prior to surgery.  Men may shave face and neck.  Do not bring valuables to the hospital.  [facility] is not responsible for any belongings or valuables.  Contacts, dentures or bridgework may not be worn into surgery.  Leave your suitcase in the car.  After surgery it may be brought to your room.  For patients admitted to the hospital, discharge time will be determined by your treatment team.  Patients discharged the day of surgery will not be allowed to drive home.   Name and phone number of your driver:   family Special instructions:  DO NOT smoke the morning of your procedure.  Please read over the following fact sheets that you were given. Anesthesia Post-op Instructions and Care and Recovery After Surgery       Upper Endoscopy, Adult, Care After This sheet gives you information about how to care for yourself after your procedure. Your health care provider may also give you more specific instructions. If you have problems or questions, contact your health care provider. What can I expect after the procedure? After the procedure, it is common to have:  A sore throat.  Mild stomach pain or discomfort.  Bloating.  Nausea. Follow these instructions at home:   Follow instructions from your health care provider about what to eat or drink after your  procedure.  Return to your normal activities as told by your health care provider. Ask your health care provider what activities are safe for you.  Take over-the-counter and prescription medicines only as told by your health care provider.  Do not drive for 24 hours if you were given a sedative during your procedure.  Keep all follow-up visits as told by your health care provider. This is important. Contact a health care provider if you have:  A sore throat that lasts longer than one day.  Trouble swallowing. Get help right away if:  You vomit blood or your vomit looks like coffee grounds.  You have: ? A fever. ? Bloody, black, or tarry stools. ? A severe sore throat or you cannot swallow. ? Difficulty breathing. ? Severe pain in your chest or abdomen. Summary  After the procedure, it is common to have a sore throat, mild stomach discomfort, bloating, and nausea.  Do not drive for 24 hours if you were given a sedative during the procedure.  Follow instructions from your health care provider about what to eat or drink after your procedure.  Return to your normal activities as told by your health care provider. This information is not intended to replace advice given to you by your health care provider. Make sure you discuss any questions you have with your health care  provider. Document Revised: 03/30/2018 Document Reviewed: 03/08/2018 Elsevier Patient Education  [place].  Esophageal Dilatation Esophageal dilatation, also called esophageal dilation, is a procedure to widen or open (dilate) a blocked or narrowed part of the esophagus. The esophagus is the part of the body that moves food and liquid from the mouth to the stomach. You may need this procedure if:  You have a buildup of scar tissue in your esophagus that makes it difficult, painful, or impossible to swallow. This can be caused by gastroesophageal reflux disease (GERD).  You have cancer of the  esophagus.  There is a problem with how food moves through your esophagus. In some cases, you may need this procedure repeated at a later time to dilate the esophagus gradually. Tell a health care provider about:  Any allergies you have.  All medicines you are taking, including vitamins, herbs, eye drops, creams, and over-the-counter medicines.  Any problems you or family members have had with anesthetic medicines.  Any blood disorders you have.  Any surgeries you have had.  Any medical conditions you have.  Any antibiotic medicines you are required to take before dental procedures.  Whether you are pregnant or may be pregnant. What are the risks? Generally, this is a safe procedure. However, problems may occur, including:  Bleeding due to a tear in the lining of the esophagus.  A hole (perforation) in the esophagus. What happens before the procedure?  Follow instructions from your health care provider about eating or drinking restrictions.  Ask your health care provider about changing or stopping your regular medicines. This is especially important if you are taking diabetes medicines or blood thinners.  Plan to have someone take you home from the hospital or clinic.  Plan to have a responsible adult care for you for at least 24 hours after you leave the hospital or clinic. This is important. What happens during the procedure?  You may be given a medicine to help you relax (sedative).  A numbing medicine may be sprayed into the back of your throat, or you may gargle the medicine.  Your health care provider may perform the dilatation using various surgical instruments, such as: ? Simple dilators. This instrument is carefully placed in the esophagus to stretch it. ? Guided wire bougies. This involves using an endoscope to insert a wire into the esophagus. A dilator is passed over this wire to enlarge the esophagus. Then the wire is removed. ? Balloon dilators. An endoscope  with a small balloon at the end is inserted into the esophagus. The balloon is inflated to stretch the esophagus and open it up. The procedure may vary among health care providers and hospitals. What happens after the procedure?  Your blood pressure, heart rate, breathing rate, and blood oxygen level will be monitored until the medicines you were given have worn off.  Your throat may feel slightly sore and numb. This will improve slowly over time.  You will not be allowed to eat or drink until your throat is no longer numb.  When you are able to drink, urinate, and sit on the edge of the bed without nausea or dizziness, you may be able to return home. Follow these instructions at home:  Take over-the-counter and prescription medicines only as told by your health care provider.  Do not drive for 24 hours if you were given a sedative during your procedure.  You should have a responsible adult with you for 24 hours after the procedure.  Follow instructions from your health care provider about any eating or drinking restrictions.  Do not use any products that contain nicotine or tobacco, such as cigarettes and e-cigarettes. If you need help quitting, ask your health care provider.  Keep all follow-up visits as told by your health care provider. This is important. Get help right away if you:  Have a fever.  Have chest pain.  Have pain that is not relieved by medication.  Have trouble breathing.  Have trouble swallowing.  Vomit blood. Summary  Esophageal dilatation, also called esophageal dilation, is a procedure to widen or open (dilate) a blocked or narrowed part of the esophagus.  Plan to have someone take you home from the hospital or clinic.  For this procedure, a numbing medicine may be sprayed into the back of your throat, or you may gargle the medicine.  Do not drive for 24 hours if you were given a sedative during your procedure. This information is not intended to  replace advice given to you by your health care provider. Make sure you discuss any questions you have with your health care provider. Document Revised: 08/03/2019 Document Reviewed: 08/11/2017 Elsevier Patient Education  2020 [place] After These instructions provide you with information about caring for yourself after your procedure. Your health care provider may also give you more specific instructions. Your treatment has been planned according to current medical practices, but problems sometimes occur. Call your health care provider if you have any problems or questions after your procedure. What can I expect after the procedure? After your procedure, you may:  Feel sleepy for several hours.  Feel clumsy and have poor balance for several hours.  Feel forgetful about what happened after the procedure.  Have poor judgment for several hours.  Feel nauseous or vomit.  Have a sore throat if you had a breathing tube during the procedure. Follow these instructions at home: For at least 24 hours after the procedure:      Have a responsible adult stay with you. It is important to have someone help care for you until you are awake and alert.  Rest as needed.  Do not: ? Participate in activities in which you could fall or become injured. ? Drive. ? Use heavy machinery. ? Drink alcohol. ? Take sleeping pills or medicines that cause drowsiness. ? Make important decisions or sign legal documents. ? Take care of children on your own. Eating and drinking  Follow the diet that is recommended by your health care provider.  If you vomit, drink water, juice, or soup when you can drink without vomiting.  Make sure you have little or no nausea before eating solid foods. General instructions  Take over-the-counter and prescription medicines only as told by your health care provider.  If you have sleep apnea, surgery and certain medicines can increase  your risk for breathing problems. Follow instructions from your health care provider about wearing your sleep device: ? Anytime you are sleeping, including during daytime naps. ? While taking prescription pain medicines, sleeping medicines, or medicines that make you drowsy.  If you smoke, do not smoke without supervision.  Keep all follow-up visits as told by your health care provider. This is important. Contact a health care provider if:  You keep feeling nauseous or you keep vomiting.  You feel light-headed.  You develop a rash.  You have a fever. Get help right away if:  You have trouble breathing. Summary  For several  hours after your procedure, you may feel sleepy and have poor judgment.  Have a responsible adult stay with you for at least 24 hours or until you are awake and alert. This information is not intended to replace advice given to you by your health care provider. Make sure you discuss any questions you have with your health care provider. Document Revised: 01/04/2018 Document Reviewed: 01/27/2016 Elsevier Patient Education  [place].

## 2020-10-07 ENCOUNTER — Other Ambulatory Visit: Payer: Self-pay

## 2020-10-08 ENCOUNTER — Other Ambulatory Visit: Payer: Self-pay

## 2020-10-08 ENCOUNTER — Encounter (HOSPITAL_COMMUNITY)
Admission: RE | Admit: 2020-10-08 | Discharge: 2020-10-08 | Disposition: A | Discharge status: Home / Self Care | Payer: 59 | Source: Ambulatory Visit | Attending: Internal Medicine | Admitting: Internal Medicine

## 2020-10-08 ENCOUNTER — Encounter (HOSPITAL_COMMUNITY): Payer: Self-pay

## 2020-10-08 ENCOUNTER — Other Ambulatory Visit (HOSPITAL_COMMUNITY)
Admission: RE | Admit: 2020-10-08 | Discharge: 2020-10-08 | Disposition: A | Discharge status: Home / Self Care | Payer: 59 | Source: Ambulatory Visit | Attending: Internal Medicine | Admitting: Internal Medicine

## 2020-10-08 DIAGNOSIS — Z20822 Contact with and (suspected) exposure to covid-19: Secondary | ICD-10-CM | POA: Insufficient documentation

## 2020-10-08 DIAGNOSIS — Z01818 Encounter for other preprocedural examination: Secondary | ICD-10-CM | POA: Insufficient documentation

## 2020-10-08 LAB — BASIC METABOLIC PANEL
Anion gap: 10 (ref 5–15)
BUN: 13 mg/dL (ref 8–23)
CO2: 23 mmol/L (ref 22–32)
Calcium: 9.1 mg/dL (ref 8.9–10.3)
Chloride: 105 mmol/L (ref 98–111)
Creatinine, Ser: 0.98 mg/dL (ref 0.44–1.00)
GFR, Estimated: >60 mL/min (ref >60)
Glucose, Bld: 134 mg/dL — ABNORMAL HIGH (ref 70–99)
Potassium: 3.5 mmol/L (ref 3.5–5.1)
Sodium: 138 mmol/L (ref 135–145)

## 2020-10-08 LAB — SARS CORONAVIRUS 2 (TAT 6-24 HRS): SARS Coronavirus 2: NEGATIVE

## 2020-10-09 ENCOUNTER — Ambulatory Visit (HOSPITAL_COMMUNITY)
Admission: RE | Admit: 2020-10-09 | Discharge: 2020-10-09 | Disposition: A | Discharge status: Home / Self Care | Payer: 59 | Attending: Internal Medicine | Admitting: Internal Medicine

## 2020-10-09 ENCOUNTER — Ambulatory Visit (HOSPITAL_COMMUNITY): Payer: 59 | Admitting: Anesthesiology

## 2020-10-09 ENCOUNTER — Encounter (HOSPITAL_COMMUNITY)
Admission: RE | Disposition: A | Discharge status: Home / Self Care | Payer: Self-pay | Source: Home / Self Care | Attending: Internal Medicine

## 2020-10-09 ENCOUNTER — Encounter (HOSPITAL_COMMUNITY): Payer: Self-pay

## 2020-10-09 ENCOUNTER — Telehealth: Payer: Self-pay | Admitting: Orthopedic Surgery

## 2020-10-09 ENCOUNTER — Other Ambulatory Visit: Payer: Self-pay

## 2020-10-09 DIAGNOSIS — Z7984 Long term (current) use of oral hypoglycemic drugs: Secondary | ICD-10-CM | POA: Insufficient documentation

## 2020-10-09 DIAGNOSIS — K222 Esophageal obstruction: Secondary | ICD-10-CM | POA: Diagnosis not present

## 2020-10-09 DIAGNOSIS — K259 Gastric ulcer, unspecified as acute or chronic, without hemorrhage or perforation: Secondary | ICD-10-CM | POA: Insufficient documentation

## 2020-10-09 DIAGNOSIS — Z886 Allergy status to analgesic agent status: Secondary | ICD-10-CM | POA: Diagnosis not present

## 2020-10-09 DIAGNOSIS — K2970 Gastritis, unspecified, without bleeding: Secondary | ICD-10-CM

## 2020-10-09 DIAGNOSIS — Z79899 Other long term (current) drug therapy: Secondary | ICD-10-CM | POA: Insufficient documentation

## 2020-10-09 DIAGNOSIS — K31A0 Gastric intestinal metaplasia, unspecified: Secondary | ICD-10-CM | POA: Insufficient documentation

## 2020-10-09 DIAGNOSIS — R1310 Dysphagia, unspecified: Secondary | ICD-10-CM | POA: Diagnosis present

## 2020-10-09 DIAGNOSIS — K449 Diaphragmatic hernia without obstruction or gangrene: Secondary | ICD-10-CM | POA: Insufficient documentation

## 2020-10-09 DIAGNOSIS — R109 Unspecified abdominal pain: Secondary | ICD-10-CM | POA: Diagnosis not present

## 2020-10-09 DIAGNOSIS — K319 Disease of stomach and duodenum, unspecified: Secondary | ICD-10-CM | POA: Insufficient documentation

## 2020-10-09 DIAGNOSIS — Z96652 Presence of left artificial knee joint: Secondary | ICD-10-CM | POA: Diagnosis not present

## 2020-10-09 HISTORY — PX: ESOPHAGOGASTRODUODENOSCOPY (EGD) WITH PROPOFOL: SHX5813

## 2020-10-09 HISTORY — PX: BIOPSY: SHX5522

## 2020-10-09 HISTORY — PX: BALLOON DILATION: SHX5330

## 2020-10-09 LAB — GLUCOSE, CAPILLARY
Glucose-Capillary: 103 mg/dL — ABNORMAL HIGH (ref 70–99)
Glucose-Capillary: 95 mg/dL (ref 70–99)

## 2020-10-09 SURGERY — ESOPHAGOGASTRODUODENOSCOPY (EGD) WITH PROPOFOL
Anesthesia: General

## 2020-10-09 MED ORDER — GLYCOPYRROLATE 0.2 MG/ML IJ SOLN
0.2000 mg | Freq: Once | INTRAMUSCULAR | Status: AC
  Administered 2020-10-09: 0.2 mg via INTRAVENOUS

## 2020-10-09 MED ORDER — LACTATED RINGERS IV SOLN: INTRAVENOUS | Status: DC | PRN

## 2020-10-09 MED ORDER — LACTATED RINGERS IV SOLN
Freq: Once | INTRAVENOUS | Status: AC
  Administered 2020-10-09: 1000 mL via INTRAVENOUS

## 2020-10-09 MED ORDER — STERILE WATER FOR IRRIGATION IR SOLN
Status: DC | PRN
  Administered 2020-10-09: 100 mL

## 2020-10-09 MED ORDER — LIDOCAINE VISCOUS HCL 2 % MT SOLN
15.0000 mL | Freq: Once | OROMUCOSAL | Status: AC
  Administered 2020-10-09: 15 mL via OROMUCOSAL

## 2020-10-09 MED ORDER — PANTOPRAZOLE SODIUM 40 MG PO TBEC: 40.0000 mg | DELAYED_RELEASE_TABLET | Freq: Every day | ORAL | 1 refills | Status: DC

## 2020-10-09 MED ORDER — LIDOCAINE HCL (CARDIAC) PF 50 MG/5ML IV SOSY
PREFILLED_SYRINGE | INTRAVENOUS | Status: DC | PRN
  Administered 2020-10-09: 25 mg via INTRAVENOUS

## 2020-10-09 MED ORDER — LIDOCAINE VISCOUS HCL 2 % MT SOLN
OROMUCOSAL | Status: AC
  Filled 2020-10-09: qty 15

## 2020-10-09 MED ORDER — PROPOFOL 10 MG/ML IV BOLUS
INTRAVENOUS | Status: DC | PRN
  Administered 2020-10-09: 100 mg via INTRAVENOUS
  Administered 2020-10-09: 30 mg via INTRAVENOUS
  Administered 2020-10-09 (×2): 50 mg via INTRAVENOUS

## 2020-10-09 MED ORDER — GLYCOPYRROLATE 0.2 MG/ML IJ SOLN
INTRAMUSCULAR | Status: AC
  Filled 2020-10-09: qty 1

## 2020-10-09 NOTE — H&P (Signed)
Primary Care Physician:  Celene Squibb, MD Primary Gastroenterologist:  Dr. Abbey Chatters  Pre-Procedure History & Physical: HPI:  Rhonda Patton is a 59 y.o. female is here for an EGD due to history of dysphagia and abdominal pain. Solid food dysphagia.    Patient denies any family history of colorectal cancer.  No melena or hematochezia.  No unintentional weight loss.  Chronic epigastric pain.   Past Medical History:  Diagnosis Date  . Angina    occasional  . Anxiety   . Arthritis   . Carpal tunnel syndrome of right wrist   . Diabetes ([place])   . GERD (gastroesophageal reflux disease)   . Hypercholesteremia   . Hypertension   . Panic attack   . PONV (postoperative nausea and vomiting)   . Sleep apnea    diagnosed but doesn't use CPAP; never got CPAP.    Past Surgical History:  Procedure Laterality Date  . ABDOMINAL HYSTERECTOMY    . BIOPSY  08/25/2017   Procedure: BIOPSY;  Surgeon: Danie Binder, MD;  Location: [facility];  Service: Endoscopy;;  gastric biopsy, gastric polyp  . CESAREAN SECTION     x 3  . CHOLECYSTECTOMY    . COLONOSCOPY WITH PROPOFOL N/A 08/25/2017   TI appeared normal. 5 mm polyp in rectum. Sessile. Redundant colon. Internal hemorrhoids. Hyperplastic.   Marland Kitchen ESOPHAGOGASTRODUODENOSCOPY (EGD) WITH PROPOFOL N/A 08/25/2017   benign-apearing esophageal stenosis, dilation, small hiatal hernia, mild gastritis due to NSAIDs. Negative H.pylori.   Marland Kitchen FOOT SURGERY Right    x 2-bone spur  . HAND SURGERY Right    x 2-carpal tunnel  . KNEE ARTHROSCOPY Left   . KNEE ARTHROSCOPY WITH MEDIAL MENISECTOMY Left 05/07/2017   Procedure: KNEE ARTHROSCOPY WITH MEDIAL MENISECTOMY AND LATERAL MENISECTOMY, limited debriedment;  Surgeon: Carole Civil, MD;  Location: [facility];  Service: Orthopedics;  Laterality: Left;  . PARTIAL HYSTERECTOMY  2001   total abdominal  . POLYPECTOMY  08/25/2017   Procedure: POLYPECTOMY;  Surgeon: Danie Binder, MD;  Location: [facility];  Service:  Endoscopy;;  rectal polyp cs  . SAVORY DILATION N/A 08/25/2017   Procedure: SAVORY DILATION;  Surgeon: Danie Binder, MD;  Location: [facility];  Service: Endoscopy;  Laterality: N/A;  . TOTAL KNEE ARTHROPLASTY Left 09/24/2017   Procedure: TOTAL KNEE ARTHROPLASTY;  Surgeon: Carole Civil, MD;  Location: [facility];  Service: Orthopedics;  Laterality: Left;  . TRIGGER FINGER RELEASE Right 11/27/2016   Procedure: RELEASE TRIGGER FINGER/A-1 PULLEY RIGHT LONG FINGER;  Surgeon: Carole Civil, MD;  Location: [facility];  Service: Orthopedics;  Laterality: Right;  pt knows to arrive at 8:45    Prior to Admission medications   Medication Sig Start Date End Date Taking? Authorizing Provider  famotidine (PEPCID) 40 MG tablet Take 40 mg by mouth at bedtime. 06/10/20  Yes [provider]  gabapentin (NEURONTIN) 300 MG capsule Take 300 mg by mouth 3 (three) times daily.  09/02/17  Yes [provider]  hydrochlorothiazide (MICROZIDE) 12.5 MG capsule Take 12.5 mg by mouth daily.  08/21/17  Yes [provider]  HYDROcodone-acetaminophen (NORCO/VICODIN) 5-325 MG tablet Take 1 tablet by mouth every 6 (six) hours as needed for moderate pain. 09/17/20  Yes Carole Civil, MD  meloxicam (MOBIC) 7.5 MG tablet Take 1 tablet (7.5 mg total) by mouth daily. 06/18/20  Yes Carole Civil, MD  metFORMIN (GLUCOPHAGE) 500 MG tablet Take 500 mg by mouth 2 (two) times daily.  06/29/20  Yes [provider]  methocarbamol (ROBAXIN) 750 MG tablet Take 750 mg by mouth every 6 (six) hours as needed for muscle spasms.  09/02/17  Yes [provider]  pantoprazole (PROTONIX) 40 MG tablet Take 40 mg by mouth daily. 07/25/17  Yes [provider]  acetaminophen (TYLENOL) 500 MG tablet Take 1,000 mg by mouth every 8 (eight) hours as needed for moderate pain.    [provider]    Allergies as of 08/14/2020 - Review Complete 08/14/2020  Allergen Reaction Noted  .  Codeine Shortness Of Breath and Nausea And Vomiting 08/24/2014  . Aspirin Nausea Only     Family History  Problem Relation Age of Onset  . Heart disease Other   . Arthritis Other   . Cancer Other   . Diabetes Other   . Colon cancer Mother 29  . Gastric cancer Neg Hx   . Esophageal cancer Neg Hx     Social History   Socioeconomic History  . Marital status: Divorced    Spouse name: Not on file  . Number of children: Not on file  . Years of education: 68  . Highest education level: Not on file  Occupational History  . Not on file  Tobacco Use  . Smoking status: Never Smoker  . Smokeless tobacco: Never Used  Vaping Use  . Vaping Use: Never used  Substance and Sexual Activity  . Alcohol use: No  . Drug use: No  . Sexual activity: Not Currently    Birth control/protection: Surgical  Other Topics Concern  . Not on file  Social History Narrative  . Not on file   Social Determinants of Health   Financial Resource Strain: Not on file  Food Insecurity: Not on file  Transportation Needs: Not on file  Physical Activity: Not on file  Stress: Not on file  Social Connections: Not on file  Intimate Partner Violence: Not on file    Review of Systems: See HPI, otherwise negative ROS  Impression/Plan: Rhonda Patton is here for an EGD due to history of dysphagia and abdominal pain.   The risks of the procedure including infection, bleed, or perforation as well as benefits, limitations, alternatives and imponderables have been reviewed with the patient. Questions have been answered. All parties agreeable.

## 2020-10-09 NOTE — Discharge Instructions (Addendum)
EGD Discharge instructions Please read the instructions outlined below and refer to this sheet in the next few weeks. These discharge instructions provide you with general information on caring for yourself after you leave the hospital. Your doctor may also give you specific instructions. While your treatment has been planned according to the most current medical practices available, unavoidable complications occasionally occur. If you have any problems or questions after discharge, please call your doctor. ACTIVITY  You may resume your regular activity but move at a slower pace for the next 24 hours.   Take frequent rest periods for the next 24 hours.   Walking will help expel (get rid of) the air and reduce the bloated feeling in your abdomen.   No driving for 24 hours (because of the anesthesia (medicine) used during the test).   You may shower.   Do not sign any important legal documents or operate any machinery for 24 hours (because of the anesthesia used during the test).  NUTRITION  Drink plenty of fluids.   You may resume your normal diet.   Begin with a light meal and progress to your normal diet.   Avoid alcoholic beverages for 24 hours or as instructed by your caregiver.  MEDICATIONS  You may resume your normal medications unless your caregiver tells you otherwise.  WHAT YOU CAN EXPECT TODAY  You may experience abdominal discomfort such as a feeling of fullness or "gas" pains.  FOLLOW-UP  Your doctor will discuss the results of your test with you.  SEEK IMMEDIATE MEDICAL ATTENTION IF ANY OF THE FOLLOWING OCCUR:  Excessive nausea (feeling sick to your stomach) and/or vomiting.   Severe abdominal pain and distention (swelling).   Trouble swallowing.   Temperature over 101 F (37.8 C).   Rectal bleeding or vomiting of blood.    Your EGD showed a mild amount inflammation in her stomach.  I biopsied this to rule out infection with a bacteria called H. pylori.   You had a slight narrowing of your esophagus which I did stretch with the balloon.  Hopefully this helps with your swallowing.  You do have a small hiatal hernia, does not appear that it has worsened since previous upper endoscopy.  Await pathology results, my office will contact you next week.  Follow-up as previously scheduled.  I hope you have a great rest of your week!  Elon Alas. Abbey Chatters, D.O. Gastroenterology and Hepatology [facility]   Hernia, Adult     A hernia happens when tissue inside your body pushes out through a weak spot in your belly muscles (abdominal wall). This makes a round lump (bulge). The lump may be:  In a scar from surgery that was done in your belly (incisional hernia).  Near your belly button (umbilical hernia).  In your groin (inguinal hernia). Your groin is the area where your leg meets your lower belly (abdomen). This kind of hernia could also be: ? In your scrotum, if you are female. ? In folds of skin around your vagina, if you are female.  In your upper thigh (femoral hernia).  Inside your belly (hiatal hernia). This happens when your stomach slides above the muscle between your belly and your chest (diaphragm). If your hernia is small and it does not cause pain, you may not need treatment. If your hernia is large or it causes pain, you may need surgery. Follow these instructions at home: Activity  Avoid stretching or overusing (straining) the muscles near your hernia. Straining can  happen when you: ? Lift something heavy. ? Poop (have a bowel movement).  Do not lift anything that is heavier than 10 lb (4.5 kg), or the limit that you are told, until your doctor says that it is safe.  Use the strength of your legs when you lift something heavy. Do not use only your back muscles to lift. General instructions  Do these things if told by your doctor so you do not have trouble pooping (constipation): ? Drink enough fluid  to keep your pee (urine) pale yellow. ? Eat foods that are high in fiber. These include fresh fruits and vegetables, whole grains, and beans. ? Limit foods that are high in fat and processed sugars. These include foods that are fried or sweet. ? Take medicine for trouble pooping.  When you cough, try to cough gently.  You may try to push your hernia in by very gently pressing on it when you are lying down. Do not try to force the bulge back in if it will not push in easily.  If you are overweight, work with your doctor to lose weight safely.  Do not use any products that have nicotine or tobacco in them. These include cigarettes and e-cigarettes. If you need help quitting, ask your doctor.  If you will be having surgery (hernia repair), watch your hernia for changes in shape, size, or color. Tell your doctor if you see any changes.  Take over-the-counter and prescription medicines only as told by your doctor.  Keep all follow-up visits as told by your doctor. Contact a doctor if:  You get new pain, swelling, or redness near your hernia.  You poop fewer times in a week than normal.  You have trouble pooping.  You have poop (stool) that is more dry than normal.  You have poop that is harder or larger than normal. Get help right away if:  You have a fever.  You have belly pain that gets worse.  You feel sick to your stomach (nauseous).  You throw up (vomit).  Your hernia cannot be pushed in by very gently pressing on it when you are lying down. Do not try to force the bulge back in if it will not push in easily.  Your hernia: ? Changes in shape or size. ? Changes color. ? Feels hard or it hurts when you touch it. These symptoms may represent a serious problem that is an emergency. Do not wait to see if the symptoms will go away. Get medical help right away. Call your local emergency services (911 in the [place] Summary  A hernia happens when tissue inside your body pushes  out through a weak spot in the belly muscles. This creates a bulge.  If your hernia is small and it does not hurt, you may not need treatment. If your hernia is large or it hurts, you may need surgery.  If you will be having surgery, watch your hernia for changes in shape, size, or color. Tell your doctor about any changes. This information is not intended to replace advice given to you by your health care provider. Make sure you discuss any questions you have with your health care provider. Document Revised: 01/27/2019 Document Reviewed: 07/08/2017 Elsevier Patient Education  [place].

## 2020-10-09 NOTE — Anesthesia Preprocedure Evaluation (Signed)
Anesthesia Evaluation  Patient identified by MRN, date of birth, ID band Patient awake    Reviewed: Allergy & Precautions, NPO status , Patient's Chart, lab work & pertinent test results  History of Anesthesia Complications (+) PONV and history of anesthetic complications  Airway Mallampati: II  TM Distance: >3 FB Neck ROM: Full    Dental  (+) Dental Advisory Given, Teeth Intact   Pulmonary shortness of breath and with exertion, sleep apnea ,    Pulmonary exam normal breath sounds clear to auscultation       Cardiovascular METS (patient has left knee pain and right foot pain, can't walk too far): hypertension, Pt. on medications + angina Normal cardiovascular exam Rhythm:Regular Rate:Normal     Neuro/Psych Anxiety  Neuromuscular disease    GI/Hepatic Neg liver ROS, GERD  Medicated,  Endo/Other  diabetes, Well Controlled, Type 2, Oral Hypoglycemic AgentsMorbid obesity  Renal/GU      Musculoskeletal  (+) Arthritis ,   Abdominal   Peds  Hematology   Anesthesia Other Findings   Reproductive/Obstetrics                            Anesthesia Physical Anesthesia Plan  ASA: III  Anesthesia Plan: General   Post-op Pain Management:    Induction: Intravenous  PONV Risk Score and Plan: TIVA  Airway Management Planned: Nasal Cannula and Natural Airway  Additional Equipment:   Intra-op Plan:   Post-operative Plan: Extubation in OR  Informed Consent: I have reviewed the patients History and Physical, chart, labs and discussed the procedure including the risks, benefits and alternatives for the proposed anesthesia with the patient or authorized representative who has indicated his/her understanding and acceptance.     Dental advisory given  Plan Discussed with: CRNA and Surgeon  Anesthesia Plan Comments:         Anesthesia Quick Evaluation

## 2020-10-09 NOTE — Anesthesia Postprocedure Evaluation (Signed)
Anesthesia Post Note  Patient: Rhonda Patton  Procedure(s) Performed: ESOPHAGOGASTRODUODENOSCOPY (EGD) WITH PROPOFOL (N/A ) BALLOON DILATION (N/A ) BIOPSY  Patient location during evaluation: PACU Anesthesia Type: General Level of consciousness: awake and alert and patient cooperative Pain management: satisfactory to patient Vital Signs Assessment: post-procedure vital signs reviewed and stable Respiratory status: spontaneous breathing Postop Assessment: no apparent nausea or vomiting Anesthetic complications: no   No complications documented.   Last Vitals:  Vitals:   10/09/20 1001 10/09/20 1159  BP: 133/72   Pulse: 63 80  Resp: 19 (!) 23  Temp: 36.9 C 36.7 C  SpO2: 100% 98%    Last Pain:  Vitals:   10/09/20 1001  TempSrc: Oral  PainSc: 8                  Brentley Landfair

## 2020-10-09 NOTE — Op Note (Signed)
[facility] Patient Name: Rhonda Patton Procedure Date: 10/09/2020 11:31 AM MRN: [id number] Date of Birth: [birth year] Attending MD: Elon Alas. Abbey Chatters DO CSN: [id number] Age: 59 Admit Type: Outpatient Procedure:                Upper GI endoscopy Indications:              Dysphagia Providers:                Elon Alas. Abbey Chatters, DO, Janeece Riggers, RN, Raphael Gibney, Technician Referring MD:              Medicines:                See the Anesthesia note for documentation of the                            administered medications Complications:            No immediate complications. Estimated Blood Loss:     Estimated blood loss was minimal. Procedure:                Pre-Anesthesia Assessment:                           - The anesthesia plan was to use monitored                            anesthesia care (MAC).                           After obtaining informed consent, the endoscope was                            passed under direct vision. Throughout the                            procedure, the patient's blood pressure, pulse, and                            oxygen saturations were monitored continuously. The                            [id number]-H190 ([id number]) scope was introduced through the                            mouth, and advanced to the second part of duodenum.                            The upper GI endoscopy was accomplished without                            difficulty. The patient tolerated the procedure                            well. Scope In: 06:30:16 AM Scope Out:  11:52:39 AM Total Procedure Duration: 0 hours 6 minutes 28 seconds  Findings:      The Z-line was irregular. Biopsies were taken with a cold forceps for       histology.      A small hiatal hernia was present.      Diffuse mild inflammation characterized by erythema was found in the       entire examined stomach. Biopsies were taken with a cold forceps for       Helicobacter  pylori testing.      The duodenal bulb, first portion of the duodenum and second portion of       the duodenum were normal. Biopsies for histology were taken with a cold       forceps for evaluation of celiac disease.      One benign-appearing, intrinsic mild stenosis was found in the lower       third of the esophagus. The stenosis was traversed. A TTS dilator was       passed through the scope. Dilation with an 18-19-20 mm balloon dilator       was performed to 20 mm. The dilation site was examined and showed       moderate improvement in luminal narrowing. Impression:               - Z-line irregular. Biopsied.                           - Small hiatal hernia.                           - Gastritis. Biopsied.                           - Normal duodenal bulb, first portion of the                            duodenum and second portion of the duodenum.                            Biopsied.                           - Benign-appearing esophageal stenosis. Dilated. Moderate Sedation:      Per Anesthesia Care Recommendation:           - Patient has a contact number available for                            emergencies. The signs and symptoms of potential                            delayed complications were discussed with the                            patient. Return to normal activities tomorrow.                            Written discharge instructions were provided to the  patient.                           - Resume previous diet.                           - Continue present medications.                           - Await pathology results.                           - Repeat upper endoscopy PRN for retreatment.                           - Return to GI clinic as previously scheduled. Procedure Code(s):        --- Professional ---                           [phone], Esophagogastroduodenoscopy, flexible,                            transoral; with transendoscopic balloon  dilation of                            esophagus (less than 30 mm diameter)                           43239, 59, Esophagogastroduodenoscopy, flexible,                            transoral; with biopsy, single or multiple Diagnosis Code(s):        --- Professional ---                           K22.8, Other specified diseases of esophagus                           K44.9, Diaphragmatic hernia without obstruction or                            gangrene                           K29.70, Gastritis, unspecified, without bleeding                           K22.2, Esophageal obstruction                           R13.10, Dysphagia, unspecified CPT copyright 2019 American Medical Association. All rights reserved. The codes documented in this report are preliminary and upon coder review may  be revised to meet current compliance requirements. Elon Alas. Abbey Chatters, DO [name], DO 10/09/2020 12:24:02 PM This report has been signed electronically. Number of Addenda: 0

## 2020-10-09 NOTE — Telephone Encounter (Signed)
Patient called to speak with Amy. States she did see Dr Sharol Given at the [facility] office, and said is still waiting to hear back about the surgery that was discussed. I relayed that she is under Dr Jess Barters care; will forward to Amy as requested.

## 2020-10-09 NOTE — Transfer of Care (Signed)
Immediate Anesthesia Transfer of Care Note  Patient: Rhonda Patton  Procedure(s) Performed: ESOPHAGOGASTRODUODENOSCOPY (EGD) WITH PROPOFOL (N/A ) BALLOON DILATION (N/A ) BIOPSY  Patient Location: PACU  Anesthesia Type:General  Level of Consciousness: awake, alert  and patient cooperative  Airway & Oxygen Therapy: Patient Spontanous Breathing  Post-op Assessment: Report given to RN and Post -op Vital signs reviewed and stable  Post vital signs: Reviewed and stable  Last Vitals:  Vitals Value Taken Time  BP    Temp 98.1   Pulse 80 10/09/20 1158  Resp 23 10/09/20 1158  SpO2 98 % 10/09/20 1158  Vitals shown include unvalidated device data.  Last Pain:  Vitals:   10/09/20 1001  TempSrc: Oral  PainSc: 8       Patients Stated Pain Goal: 5 (27/80/04 4715)  Complications: No complications documented.

## 2020-10-10 ENCOUNTER — Other Ambulatory Visit: Payer: Self-pay

## 2020-10-10 LAB — SURGICAL PATHOLOGY

## 2020-10-10 NOTE — Telephone Encounter (Signed)
I called her back. She is waiting on a call from Dr Sharol Given office to schedule surgery and she knows I worked there previously. I told her I will have Malachy Mood call her to schedule  Can you give her a call?

## 2020-10-17 ENCOUNTER — Encounter (HOSPITAL_COMMUNITY): Payer: Self-pay | Admitting: Internal Medicine

## 2020-10-22 ENCOUNTER — Telehealth: Payer: Self-pay

## 2020-10-22 ENCOUNTER — Other Ambulatory Visit: Payer: Self-pay | Admitting: Orthopedic Surgery

## 2020-10-22 MED ORDER — HYDROCODONE-ACETAMINOPHEN 5-325 MG PO TABS: 1.0000 | ORAL_TABLET | Freq: Four times a day (QID) | ORAL | 0 refills | Status: DC | PRN

## 2020-10-22 NOTE — Telephone Encounter (Signed)
Patient is wanting to speak with you about her referral to [facility]. States that her pain is worse and she doesn't have any pain  Medication. Please call her at [phone].

## 2020-10-22 NOTE — Telephone Encounter (Signed)
I can send her some medication in   She needs to go to her appt I can no longer treat her foot problem

## 2020-10-22 NOTE — Progress Notes (Signed)
?

## 2020-11-12 ENCOUNTER — Telehealth: Payer: Self-pay

## 2020-11-12 NOTE — Telephone Encounter (Signed)
Pt called asking about surgery.  She states she in in a lot of pain to the point where she is struggling to work.

## 2020-11-12 NOTE — Telephone Encounter (Signed)
Looks like this pt is to be scheduled for a PF release and gastroc please see below.

## 2020-11-14 ENCOUNTER — Encounter: Payer: Self-pay | Admitting: *Deleted

## 2020-11-14 ENCOUNTER — Ambulatory Visit (INDEPENDENT_AMBULATORY_CARE_PROVIDER_SITE_OTHER): Payer: 59 | Admitting: Gastroenterology

## 2020-11-14 ENCOUNTER — Other Ambulatory Visit: Payer: Self-pay | Admitting: Gastroenterology

## 2020-11-14 ENCOUNTER — Other Ambulatory Visit: Payer: Self-pay

## 2020-11-14 ENCOUNTER — Encounter: Payer: Self-pay | Admitting: Gastroenterology

## 2020-11-14 VITALS — BP 136/85 | HR 73 | Temp 97.0°F | Ht 63.0 in | Wt 265.0 lb

## 2020-11-14 DIAGNOSIS — R1013 Epigastric pain: Secondary | ICD-10-CM | POA: Diagnosis not present

## 2020-11-14 NOTE — Progress Notes (Signed)
Cc'ed to pcp °

## 2020-11-14 NOTE — Patient Instructions (Addendum)
Please complete blood work today.  I have ordered a CT scan to be done as soon as possible. Please do not take metformin the day of the scan and for 48 hours after.   Continue Protonix once daily.  Further recommendations to follow!  I enjoyed seeing you again today! As you know, I value our relationship and want to provide genuine, compassionate, and quality care. I welcome your feedback. If you receive a survey regarding your visit,  I greatly appreciate you taking time to fill this out. See you next time!  Annitta Needs, PhD, ANP-BC [facility] Gastroenterology

## 2020-11-14 NOTE — Progress Notes (Signed)
Referring Provider: Celene Squibb, MD Primary Care Physician:  Celene Squibb, MD Primary GI: Dr. Abbey Chatters  Chief Complaint  Patient presents with  . Abdominal Pain    Still having ongoing pain  . Nausea    W/ vomiting    HPI:   Rhonda Patton is a 60 y.o. female presenting today with a history of chronic abdominal pain for past few years, recently undergoing EGD with +Barrett's, reactive gastropathy, and surveillance due in 2024. Epigastric pain has been worsening recently.   Family history significant for son with pancreatitis, nephew and niece with pancreatitis, brother with pancreatitis. Unknown etiology. Gallbladder absent.   Had bad episode epigastric pain yesterday. Certain foods her stomach doesn't like. Examples include dairy foods, salad dressing, fruits. N/V with episodes. Episodes will be 9 out of 10 pain, lasting about an hour. Happening about three times per week. Feels like something raw in stomach when trying to have a BM. Sometimes feels constipated. Usually BM about daily.     Past Medical History:  Diagnosis Date  . Angina    occasional  . Anxiety   . Arthritis   . Carpal tunnel syndrome of right wrist   . Diabetes ([facility])   . GERD (gastroesophageal reflux disease)   . Hypercholesteremia   . Hypertension   . Panic attack   . PONV (postoperative nausea and vomiting)   . Sleep apnea    diagnosed but doesn't use CPAP; never got CPAP.    Past Surgical History:  Procedure Laterality Date  . ABDOMINAL HYSTERECTOMY    . BALLOON DILATION N/A 10/09/2020   Procedure: BALLOON DILATION;  Surgeon: Eloise Harman, DO;  [facility]: [facility];  Service: Endoscopy;  Laterality: N/A;  . BIOPSY  08/25/2017   Procedure: BIOPSY;  Surgeon: Danie Binder, MD;  Location: [facility];  Service: Endoscopy;;  gastric biopsy, gastric polyp  . BIOPSY  10/09/2020   Procedure: BIOPSY;  Surgeon: Eloise Harman, DO;  Location: [facility];  Service: Endoscopy;;   . CESAREAN SECTION     x 3  . CHOLECYSTECTOMY    . COLONOSCOPY WITH PROPOFOL N/A 08/25/2017   TI appeared normal. 5 mm polyp in rectum. Sessile. Redundant colon. Internal hemorrhoids. Hyperplastic.   Marland Kitchen ESOPHAGOGASTRODUODENOSCOPY (EGD) WITH PROPOFOL N/A 08/25/2017   benign-apearing esophageal stenosis, dilation, small hiatal hernia, mild gastritis due to NSAIDs. Negative H.pylori.   . ESOPHAGOGASTRODUODENOSCOPY (EGD) WITH PROPOFOL N/A 10/09/2020   Z-line irregular, benign-appearing esophageal stenosis, small hiatal hernia, gastritis, normal duodenum. +Barrett's esophagus, reactive gastropathy with erosions, negative celiac. Surveillance EGD in 2024.   Marland Kitchen FOOT SURGERY Right    x 2-bone spur  . HAND SURGERY Right    x 2-carpal tunnel  . KNEE ARTHROSCOPY Left   . KNEE ARTHROSCOPY WITH MEDIAL MENISECTOMY Left 05/07/2017   Procedure: KNEE ARTHROSCOPY WITH MEDIAL MENISECTOMY AND LATERAL MENISECTOMY, limited debriedment;  Surgeon: Carole Civil, MD;  Location: [facility];  Service: Orthopedics;  Laterality: Left;  . PARTIAL HYSTERECTOMY  2001   total abdominal  . POLYPECTOMY  08/25/2017   Procedure: POLYPECTOMY;  Surgeon: Danie Binder, MD;  Location: [facility];  Service: Endoscopy;;  rectal polyp cs  . SAVORY DILATION N/A 08/25/2017   Procedure: SAVORY DILATION;  Surgeon: Danie Binder, MD;  Location: [facility];  Service: Endoscopy;  Laterality: N/A;  . TOTAL KNEE ARTHROPLASTY Left 09/24/2017   Procedure: TOTAL KNEE ARTHROPLASTY;  Surgeon: [name]  E, MD;  Location: [facility];  Service: Orthopedics;  Laterality: Left;  . TRIGGER FINGER RELEASE Right 11/27/2016   Procedure: RELEASE TRIGGER FINGER/A-1 PULLEY RIGHT LONG FINGER;  Surgeon: Carole Civil, MD;  Location: [facility];  Service: Orthopedics;  Laterality: Right;  pt knows to arrive at 8:45    Current Outpatient Medications  Medication Sig Dispense Refill  . acetaminophen (TYLENOL) 500 MG tablet Take 1,000 mg by mouth every  8 (eight) hours as needed for moderate pain.    . famotidine (PEPCID) 40 MG tablet Take 40 mg by mouth at bedtime.    . gabapentin (NEURONTIN) 300 MG capsule Take 300 mg by mouth 3 (three) times daily.   1  . hydrochlorothiazide (MICROZIDE) 12.5 MG capsule Take 12.5 mg by mouth daily.   1  . HYDROcodone-acetaminophen (NORCO/VICODIN) 5-325 MG tablet Take 1 tablet by mouth every 6 (six) hours as needed for moderate pain. 30 tablet 0  . meloxicam (MOBIC) 7.5 MG tablet Take 1 tablet (7.5 mg total) by mouth daily. 30 tablet 5  . metFORMIN (GLUCOPHAGE) 500 MG tablet Take 500 mg by mouth 2 (two) times daily.    . methocarbamol (ROBAXIN) 750 MG tablet Take 750 mg by mouth every 6 (six) hours as needed for muscle spasms.   1  . pantoprazole (PROTONIX) 40 MG tablet Take 40 mg by mouth daily.  2   No current facility-administered medications for this visit.    Allergies as of 11/14/2020 - Review Complete 11/14/2020  Allergen Reaction Noted  . Codeine Shortness Of Breath and Nausea And Vomiting 08/24/2014  . Aspirin Nausea Only     Family History  Problem Relation Age of Onset  . Heart disease Other   . Arthritis Other   . Cancer Other   . Diabetes Other   . Colon cancer Mother 22  . Gastric cancer Neg Hx   . Esophageal cancer Neg Hx     Social History   Socioeconomic History  . Marital status: Divorced    Spouse name: Not on file  . Number of children: Not on file  . Years of education: 32  . Highest education level: Not on file  Occupational History  . Not on file  Tobacco Use  . Smoking status: Never Smoker  . Smokeless tobacco: Never Used  Vaping Use  . Vaping Use: Never used  Substance and Sexual Activity  . Alcohol use: No  . Drug use: No  . Sexual activity: Not Currently    Birth control/protection: Surgical  Other Topics Concern  . Not on file  Social History Narrative  . Not on file   Social Determinants of Health   Financial Resource Strain: Not on file  Food  Insecurity: Not on file  Transportation Needs: Not on file  Physical Activity: Not on file  Stress: Not on file  Social Connections: Not on file    Review of Systems: Gen: Denies fever, chills, anorexia. Denies fatigue, weakness, weight loss.  CV: Denies chest pain, palpitations, syncope, peripheral edema, and claudication. Resp: Denies dyspnea at rest, cough, wheezing, coughing up blood, and pleurisy. GI: see HPI Derm: Denies rash, itching, dry skin Psych: Denies depression, anxiety, memory loss, confusion. No homicidal or suicidal ideation.  Heme: Denies bruising, bleeding, and enlarged lymph nodes.  Physical Exam: BP 136/85   Pulse 73   Temp (!) 97 F (36.1 C)   Ht 5\' 3"  (1.6 m)   Wt 265 lb (120.2 kg)   BMI 46.94  kg/m  General:   Alert and oriented. No distress noted. Pleasant and cooperative.  Head:  Normocephalic and atraumatic. Eyes:  Conjuctiva clear without scleral icterus. Mouth:  Mask in place Abdomen:  +BS, soft, TTP epigastric and non-distended. No rebound or guarding. No HSM or masses noted. Msk:  Symmetrical without gross deformities. Normal posture. Extremities:  Without edema. Neurologic:  Alert and  oriented x4 Psych:  Alert and cooperative. Normal mood and affect.  ASSESSMENT: Rhonda Patton is a 60 y.o. female presenting today with chronic abdominal pain now worsening, with EGD noting Barrett's and reactive gastropathy. She notes a family history of pancreatitis in multiple first-degree relatives but unknown etiology.  Most recent episode yesterday; we will order CBC, CMP, and lipase. I am ordering a CT abd/pelvis with contrast to be done as soon as possible.  Barrett's: continue PPI daily indefinitely. EGD in 3 years .   PLAN:  CBC, CMP, lipase CT abd/pelvis with contrast ASAP. Hold metformin day of scan and 48 hours after Continue PPI indefinitely EGD in 2024 Further recommendations to follow   Annitta Needs, PhD, [facility]  Gastroenterology

## 2020-11-15 ENCOUNTER — Telehealth: Payer: Self-pay | Admitting: *Deleted

## 2020-11-15 LAB — LIPASE: Lipase: 27 U/L (ref 7–60)

## 2020-11-15 LAB — CBC WITH DIFFERENTIAL/PLATELET
Absolute Monocytes: 485 cells/uL (ref 200–950)
Basophils Absolute: 40 cells/uL (ref 0–200)
Basophils Relative: 0.8 %
Eosinophils Absolute: 250 cells/uL (ref 15–500)
Eosinophils Relative: 5 %
HCT: 41.9 % (ref 35.0–45.0)
Hemoglobin: 13.9 g/dL (ref 11.7–15.5)
Lymphs Abs: 1585 cells/uL (ref 850–3900)
MCH: 28.4 pg (ref 27.0–33.0)
MCHC: 33.2 g/dL (ref 32.0–36.0)
MCV: 85.7 fL (ref 80.0–100.0)
MPV: 12 fL (ref 7.5–12.5)
Monocytes Relative: 9.7 %
Neutro Abs: 2640 cells/uL (ref 1500–7800)
Neutrophils Relative %: 52.8 %
Platelets: 207 10*3/uL (ref 140–400)
RBC: 4.89 10*6/uL (ref 3.80–5.10)
RDW: 13.7 % (ref 11.0–15.0)
Total Lymphocyte: 31.7 %
WBC: 5 10*3/uL (ref 3.8–10.8)

## 2020-11-15 LAB — COMPLETE METABOLIC PANEL WITH GFR
AG Ratio: 1.8 (calc) (ref 1.0–2.5)
ALT: 28 U/L (ref 6–29)
AST: 21 U/L (ref 10–35)
Albumin: 4.4 g/dL (ref 3.6–5.1)
Alkaline phosphatase (APISO): 74 U/L (ref 37–153)
BUN/Creatinine Ratio: 15 (calc) (ref 6–22)
BUN: 15 mg/dL (ref 7–25)
CO2: 29 mmol/L (ref 20–32)
Calcium: 9.7 mg/dL (ref 8.6–10.4)
Chloride: 104 mmol/L (ref 98–110)
Creat: 1.03 mg/dL — ABNORMAL HIGH (ref 0.50–0.99)
GFR, Est African American: 68 mL/min/{1.73_m2} (ref >=60)
GFR, Est Non African American: 59 mL/min/{1.73_m2} — ABNORMAL LOW (ref >=60)
Globulin: 2.5 g/dL (calc) (ref 1.9–3.7)
Glucose, Bld: 102 mg/dL (ref 65–139)
Potassium: 3.7 mmol/L (ref 3.5–5.3)
Sodium: 139 mmol/L (ref 135–146)
Total Bilirubin: 0.5 mg/dL (ref 0.2–1.2)
Total Protein: 6.9 g/dL (ref 6.1–8.1)

## 2020-11-15 NOTE — Telephone Encounter (Signed)
Called [employer]. PA approved for CT A/P. Auth# [id number] DOS 11/15/2020-12/14/2020

## 2020-11-16 ENCOUNTER — Other Ambulatory Visit: Payer: Self-pay

## 2020-11-16 ENCOUNTER — Ambulatory Visit (HOSPITAL_COMMUNITY)
Admission: RE | Admit: 2020-11-16 | Discharge: 2020-11-16 | Disposition: A | Discharge status: Home / Self Care | Payer: 59 | Source: Ambulatory Visit | Attending: Gastroenterology | Admitting: Gastroenterology

## 2020-11-16 DIAGNOSIS — R1013 Epigastric pain: Secondary | ICD-10-CM | POA: Diagnosis not present

## 2020-11-16 MED ORDER — IOHEXOL 300 MG/ML  SOLN
100.0000 mL | Freq: Once | INTRAMUSCULAR | Status: AC | PRN
  Administered 2020-11-16: 100 mL via INTRAVENOUS

## 2020-11-17 LAB — POCT I-STAT CREATININE: Creatinine, Ser: 0.9 mg/dL (ref 0.44–1.00)

## 2020-11-22 ENCOUNTER — Other Ambulatory Visit: Payer: Self-pay

## 2020-11-22 ENCOUNTER — Ambulatory Visit (INDEPENDENT_AMBULATORY_CARE_PROVIDER_SITE_OTHER): Payer: 59 | Admitting: Orthopedic Surgery

## 2020-11-22 VITALS — Ht 63.0 in | Wt 265.0 lb

## 2020-11-22 DIAGNOSIS — Z96652 Presence of left artificial knee joint: Secondary | ICD-10-CM

## 2020-11-22 DIAGNOSIS — G8929 Other chronic pain: Secondary | ICD-10-CM | POA: Diagnosis not present

## 2020-11-22 DIAGNOSIS — M25561 Pain in right knee: Secondary | ICD-10-CM

## 2020-11-22 MED ORDER — HYDROCODONE-ACETAMINOPHEN 5-325 MG PO TABS: 1.0000 | ORAL_TABLET | Freq: Four times a day (QID) | ORAL | 0 refills | Status: AC | PRN

## 2020-11-22 NOTE — Progress Notes (Signed)
FOLLOW-UP OFFICE VISIT  Chief Complaint  Patient presents with  . Follow-up    Recheck on right knee   Assessment and plan patient has chronic pain not a good surgical candidate currently chronic opioid therapy has lumbar disc disease.  Patient's weight is too high as well.  Is also diabetic.  We injected the knee continue to her pain management.  We have advised her to go to pain management on several occasions  Recommend follow-up as needed  Encounter Diagnosis  Name Primary?  . Chronic pain of right knee Yes   Meds ordered this encounter  Medications  . HYDROcodone-acetaminophen (NORCO/VICODIN) 5-325 MG tablet    Sig: Take 1 tablet by mouth every 6 (six) hours as needed for up to 5 days for moderate pain.    Dispense:  20 tablet    Refill:  0    Current Outpatient Medications:  .  acetaminophen (TYLENOL) 500 MG tablet, Take 1,000 mg by mouth every 8 (eight) hours as needed for moderate pain., Disp: , Rfl:  .  famotidine (PEPCID) 40 MG tablet, Take 40 mg by mouth at bedtime., Disp: , Rfl:  .  gabapentin (NEURONTIN) 300 MG capsule, Take 300 mg by mouth 3 (three) times daily. , Disp: , Rfl: 1 .  hydrochlorothiazide (MICROZIDE) 12.5 MG capsule, Take 12.5 mg by mouth daily. , Disp: , Rfl: 1 .  meloxicam (MOBIC) 7.5 MG tablet, Take 1 tablet (7.5 mg total) by mouth daily., Disp: 30 tablet, Rfl: 5 .  metFORMIN (GLUCOPHAGE) 500 MG tablet, Take 500 mg by mouth 2 (two) times daily., Disp: , Rfl:  .  HYDROcodone-acetaminophen (NORCO/VICODIN) 5-325 MG tablet, Take 1 tablet by mouth every 6 (six) hours as needed for up to 5 days for moderate pain., Disp: 20 tablet, Rfl: 0 .  methocarbamol (ROBAXIN) 750 MG tablet, Take 750 mg by mouth every 6 (six) hours as needed for muscle spasms. , Disp: , Rfl: 1 .  pantoprazole (PROTONIX) 40 MG tablet, Take 40 mg by mouth daily., Disp: , Rfl: 29  60 year old female chronic pain right knee osteoarthritis with [place] alignment.  Poor surgical candidate ongoing  chronic pain issues  Also has diabetes and lower back pain  Comes in for reevaluation of right knee and ongoing pain.  Previous injections have been relatively successful.    Ht 5\' 3"  (1.6 m)   Wt 265 lb (120.2 kg)   BMI 46.94 kg/m  The patient meets the AMA guidelines for Morbid (severe) obesity with a BMI > 40.0 and I have recommended weight loss.   + EXAM FINDINGS: Pain right knee full extension flexion is about 105 degrees knee is stable tenderness noted globally  ASSESSMENT AND PLAN  Xray report  Chronic pain right knee alignment varus medial compartment narrowing severe osteophytes all 3 compartments patellofemoral joint severe arthritis with central alignment  Impression severe arthritis right knee  Procedure note right knee injection   verbal consent was obtained to inject right knee joint  Timeout was completed to confirm the site of injection  The medications used were 40 mg of Depo-Medrol and 1% lidocaine 3 cc  Anesthesia was provided by ethyl chloride and the skin was prepped with alcohol.  After cleaning the skin with alcohol a 20-gauge needle was used to inject the right knee joint. There were no complications. A sterile bandage was applied.

## 2020-11-22 NOTE — Patient Instructions (Signed)
You have received an injection of steroids into the joint. 15% of patients will have increased pain within the 24 hours postinjection.   This is transient and will go away.   We recommend that you use ice packs on the injection site for 20 minutes every 2 hours and extra strength Tylenol 2 tablets every 8 as needed until the pain resolves.  If you continue to have pain after taking the Tylenol and using the ice please call the office for further instructions.  Maintain a good weight Body mass index is 46.94 kg/m.

## 2020-11-26 ENCOUNTER — Other Ambulatory Visit: Payer: Self-pay

## 2020-11-27 ENCOUNTER — Other Ambulatory Visit: Payer: Self-pay

## 2020-11-27 DIAGNOSIS — R1310 Dysphagia, unspecified: Secondary | ICD-10-CM

## 2020-11-27 DIAGNOSIS — R1013 Epigastric pain: Secondary | ICD-10-CM

## 2020-12-02 ENCOUNTER — Other Ambulatory Visit: Payer: Self-pay | Admitting: Internal Medicine

## 2020-12-03 ENCOUNTER — Other Ambulatory Visit: Payer: Self-pay | Admitting: Physician Assistant

## 2020-12-06 ENCOUNTER — Other Ambulatory Visit: Payer: Self-pay

## 2020-12-06 LAB — URINALYSIS, ROUTINE W REFLEX MICROSCOPIC
Bilirubin Urine: NEGATIVE
Glucose, UA: NEGATIVE
Hgb urine dipstick: NEGATIVE
Leukocytes,Ua: NEGATIVE
Nitrite: NEGATIVE
RBC / HPF: NONE SEEN /HPF (ref 0–2)
Specific Gravity, Urine: 1.028 (ref 1.001–1.03)
pH: 5.5 (ref 5.0–8.0)

## 2020-12-11 ENCOUNTER — Other Ambulatory Visit: Payer: Self-pay

## 2020-12-11 ENCOUNTER — Encounter (HOSPITAL_BASED_OUTPATIENT_CLINIC_OR_DEPARTMENT_OTHER): Payer: Self-pay | Admitting: Orthopedic Surgery

## 2020-12-13 ENCOUNTER — Other Ambulatory Visit: Payer: Self-pay | Admitting: Gastroenterology

## 2020-12-13 MED ORDER — SULFAMETHOXAZOLE-TRIMETHOPRIM 800-160 MG PO TABS: 1.0000 | ORAL_TABLET | Freq: Two times a day (BID) | ORAL | 0 refills | Status: AC

## 2020-12-13 NOTE — Progress Notes (Signed)
Bactrim DS BID X 3 days sent to pharmacy.

## 2020-12-14 ENCOUNTER — Other Ambulatory Visit (HOSPITAL_COMMUNITY)
Admission: RE | Admit: 2020-12-14 | Discharge: 2020-12-14 | Disposition: A | Discharge status: Home / Self Care | Payer: 59 | Source: Ambulatory Visit | Attending: Orthopedic Surgery | Admitting: Orthopedic Surgery

## 2020-12-14 DIAGNOSIS — Z20822 Contact with and (suspected) exposure to covid-19: Secondary | ICD-10-CM | POA: Diagnosis not present

## 2020-12-14 DIAGNOSIS — Z01812 Encounter for preprocedural laboratory examination: Secondary | ICD-10-CM | POA: Insufficient documentation

## 2020-12-14 LAB — SARS CORONAVIRUS 2 (TAT 6-24 HRS): SARS Coronavirus 2: NEGATIVE

## 2020-12-17 ENCOUNTER — Encounter (HOSPITAL_BASED_OUTPATIENT_CLINIC_OR_DEPARTMENT_OTHER)
Admission: RE | Admit: 2020-12-17 | Discharge: 2020-12-17 | Disposition: A | Discharge status: Home / Self Care | Payer: 59 | Source: Ambulatory Visit | Attending: Orthopedic Surgery | Admitting: Orthopedic Surgery

## 2020-12-17 DIAGNOSIS — M6701 Short Achilles tendon (acquired), right ankle: Secondary | ICD-10-CM | POA: Diagnosis not present

## 2020-12-17 DIAGNOSIS — Z79899 Other long term (current) drug therapy: Secondary | ICD-10-CM | POA: Diagnosis not present

## 2020-12-17 DIAGNOSIS — Z01818 Encounter for other preprocedural examination: Secondary | ICD-10-CM | POA: Diagnosis not present

## 2020-12-17 DIAGNOSIS — M722 Plantar fascial fibromatosis: Secondary | ICD-10-CM | POA: Diagnosis not present

## 2020-12-17 DIAGNOSIS — Z96652 Presence of left artificial knee joint: Secondary | ICD-10-CM | POA: Diagnosis not present

## 2020-12-17 LAB — BASIC METABOLIC PANEL
Anion gap: 10 (ref 5–15)
BUN: 11 mg/dL (ref 8–23)
CO2: 26 mmol/L (ref 22–32)
Calcium: 9.4 mg/dL (ref 8.9–10.3)
Chloride: 103 mmol/L (ref 98–111)
Creatinine, Ser: 1.21 mg/dL — ABNORMAL HIGH (ref 0.44–1.00)
GFR, Estimated: 51 mL/min — ABNORMAL LOW (ref >60)
Glucose, Bld: 109 mg/dL — ABNORMAL HIGH (ref 70–99)
Potassium: 3.7 mmol/L (ref 3.5–5.1)
Sodium: 139 mmol/L (ref 135–145)

## 2020-12-17 NOTE — Progress Notes (Signed)
Left message for patient to come in for lab work

## 2020-12-17 NOTE — Anesthesia Preprocedure Evaluation (Addendum)
Anesthesia Evaluation  Patient identified by MRN, date of birth, ID band Patient awake    Reviewed: Allergy & Precautions, NPO status , Patient's Chart, lab work & pertinent test results  History of Anesthesia Complications (+) PONV  Airway Mallampati: II  TM Distance: >3 FB Neck ROM: Full    Dental no notable dental hx. (+) Teeth Intact, Dental Advisory Given   Pulmonary shortness of breath, sleep apnea ,    Pulmonary exam normal breath sounds clear to auscultation       Cardiovascular hypertension, Pt. on medications Normal cardiovascular exam Rhythm:Regular Rate:Normal     Neuro/Psych Anxiety  Neuromuscular disease    GI/Hepatic GERD  ,  Endo/Other  diabetes  Renal/GU      Musculoskeletal  (+) Arthritis ,   Abdominal   Peds  Hematology negative hematology ROS (+) Lab Results      Component                Value               Date                      WBC                      5.0                 11/14/2020                HGB                      13.9                11/14/2020                HCT                      41.9                11/14/2020                MCV                      85.7                11/14/2020                PLT                      207                 11/14/2020              Anesthesia Other Findings   Reproductive/Obstetrics                            Anesthesia Physical Anesthesia Plan  ASA: III  Anesthesia Plan: General   Post-op Pain Management:  Regional for Post-op pain   Induction:   PONV Risk Score and Plan: 4 or greater and TIVA, Treatment may vary due to age or medical condition, Ondansetron and Dexamethasone  Airway Management Planned: LMA  Additional Equipment: None  Intra-op Plan:   Post-operative Plan:   Informed Consent: I have reviewed the patients History and Physical, chart, labs and discussed the procedure including the risks,  benefits and alternatives for the proposed  anesthesia with the patient or authorized representative who has indicated his/her understanding and acceptance.     Dental advisory given  Plan Discussed with: CRNA and Anesthesiologist  Anesthesia Plan Comments: (Tiva w R poplitea/ sciaticl and R adductor canal block)        Anesthesia Quick Evaluation

## 2020-12-17 NOTE — Progress Notes (Signed)

## 2020-12-18 ENCOUNTER — Other Ambulatory Visit: Payer: Self-pay

## 2020-12-18 ENCOUNTER — Ambulatory Visit (HOSPITAL_BASED_OUTPATIENT_CLINIC_OR_DEPARTMENT_OTHER)
Admission: RE | Admit: 2020-12-18 | Discharge: 2020-12-18 | Disposition: A | Discharge status: Home / Self Care | Payer: 59 | Attending: Orthopedic Surgery | Admitting: Orthopedic Surgery

## 2020-12-18 ENCOUNTER — Encounter (HOSPITAL_BASED_OUTPATIENT_CLINIC_OR_DEPARTMENT_OTHER): Payer: Self-pay | Admitting: Orthopedic Surgery

## 2020-12-18 ENCOUNTER — Ambulatory Visit (HOSPITAL_BASED_OUTPATIENT_CLINIC_OR_DEPARTMENT_OTHER): Payer: 59 | Admitting: Anesthesiology

## 2020-12-18 ENCOUNTER — Encounter (HOSPITAL_BASED_OUTPATIENT_CLINIC_OR_DEPARTMENT_OTHER)
Admission: RE | Disposition: A | Discharge status: Home / Self Care | Payer: Self-pay | Source: Home / Self Care | Attending: Orthopedic Surgery

## 2020-12-18 ENCOUNTER — Other Ambulatory Visit: Payer: Self-pay | Admitting: Physician Assistant

## 2020-12-18 DIAGNOSIS — M722 Plantar fascial fibromatosis: Secondary | ICD-10-CM | POA: Diagnosis not present

## 2020-12-18 DIAGNOSIS — Z79899 Other long term (current) drug therapy: Secondary | ICD-10-CM | POA: Diagnosis not present

## 2020-12-18 DIAGNOSIS — M6701 Short Achilles tendon (acquired), right ankle: Secondary | ICD-10-CM | POA: Insufficient documentation

## 2020-12-18 DIAGNOSIS — Z96652 Presence of left artificial knee joint: Secondary | ICD-10-CM | POA: Diagnosis not present

## 2020-12-18 HISTORY — DX: Dyspnea, unspecified: R06.00

## 2020-12-18 HISTORY — DX: Other chronic pain: G89.29

## 2020-12-18 HISTORY — PX: GASTROCNEMIUS RECESSION: SHX863

## 2020-12-18 LAB — GLUCOSE, CAPILLARY: Glucose-Capillary: 133 mg/dL — ABNORMAL HIGH (ref 70–99)

## 2020-12-18 SURGERY — RECESSION, MUSCLE, GASTROCNEMIUS
Anesthesia: General | Site: Leg Lower | Laterality: Right

## 2020-12-18 MED ORDER — LIDOCAINE HCL (CARDIAC) PF 100 MG/5ML IV SOSY
PREFILLED_SYRINGE | INTRAVENOUS | Status: DC | PRN
  Administered 2020-12-18: 100 mg via INTRAVENOUS

## 2020-12-18 MED ORDER — DEXMEDETOMIDINE (PRECEDEX) IN NS 20 MCG/5ML (4 MCG/ML) IV SYRINGE
PREFILLED_SYRINGE | INTRAVENOUS | Status: DC | PRN
  Administered 2020-12-18 (×4): 4 ug via INTRAVENOUS

## 2020-12-18 MED ORDER — PROPOFOL 500 MG/50ML IV EMUL
INTRAVENOUS | Status: AC
  Filled 2020-12-18: qty 50

## 2020-12-18 MED ORDER — CEFAZOLIN SODIUM-DEXTROSE 2-4 GM/100ML-% IV SOLN
INTRAVENOUS | Status: AC
  Filled 2020-12-18: qty 100

## 2020-12-18 MED ORDER — KETOROLAC TROMETHAMINE 30 MG/ML IJ SOLN: 30.0000 mg | Freq: Once | INTRAMUSCULAR | Status: DC | PRN

## 2020-12-18 MED ORDER — OXYCODONE HCL 5 MG PO TABS: 5.0000 mg | ORAL_TABLET | Freq: Once | ORAL | Status: DC | PRN

## 2020-12-18 MED ORDER — ONDANSETRON 4 MG PO TBDP
ORAL_TABLET | ORAL | Status: AC
  Filled 2020-12-18: qty 1

## 2020-12-18 MED ORDER — HYDROMORPHONE HCL 1 MG/ML IJ SOLN
0.2500 mg | INTRAMUSCULAR | Status: DC | PRN
  Administered 2020-12-18: 0.25 mg via INTRAVENOUS

## 2020-12-18 MED ORDER — LACTATED RINGERS IV SOLN: INTRAVENOUS | Status: DC

## 2020-12-18 MED ORDER — MIDAZOLAM HCL 2 MG/2ML IJ SOLN: 2.0000 mg | Freq: Once | INTRAMUSCULAR | Status: DC

## 2020-12-18 MED ORDER — HYDROMORPHONE HCL 1 MG/ML IJ SOLN
INTRAMUSCULAR | Status: AC
  Filled 2020-12-18: qty 0.5

## 2020-12-18 MED ORDER — FENTANYL CITRATE (PF) 100 MCG/2ML IJ SOLN: 100.0000 ug | Freq: Once | INTRAMUSCULAR | Status: DC

## 2020-12-18 MED ORDER — FENTANYL CITRATE (PF) 100 MCG/2ML IJ SOLN
INTRAMUSCULAR | Status: AC
  Filled 2020-12-18: qty 2

## 2020-12-18 MED ORDER — OXYCODONE HCL 5 MG/5ML PO SOLN: 5.0000 mg | Freq: Once | ORAL | Status: DC | PRN

## 2020-12-18 MED ORDER — ROPIVACAINE HCL 7.5 MG/ML IJ SOLN
INTRAMUSCULAR | Status: DC | PRN
  Administered 2020-12-18: 20 mL via PERINEURAL

## 2020-12-18 MED ORDER — ONDANSETRON HCL 4 MG/2ML IJ SOLN
INTRAMUSCULAR | Status: AC
  Filled 2020-12-18: qty 2

## 2020-12-18 MED ORDER — CLONIDINE HCL (ANALGESIA) 100 MCG/ML EP SOLN
EPIDURAL | Status: DC | PRN
  Administered 2020-12-18: 50 ug
  Administered 2020-12-18: 100 ug

## 2020-12-18 MED ORDER — DEXAMETHASONE SODIUM PHOSPHATE 10 MG/ML IJ SOLN
INTRAMUSCULAR | Status: AC
  Filled 2020-12-18: qty 1

## 2020-12-18 MED ORDER — CEFAZOLIN SODIUM-DEXTROSE 1-4 GM/50ML-% IV SOLN
INTRAVENOUS | Status: AC
  Filled 2020-12-18: qty 50

## 2020-12-18 MED ORDER — HYDROCODONE-ACETAMINOPHEN 5-325 MG PO TABS: 1.0000 | ORAL_TABLET | Freq: Four times a day (QID) | ORAL | 0 refills | Status: DC | PRN

## 2020-12-18 MED ORDER — ROPIVACAINE HCL 5 MG/ML IJ SOLN
INTRAMUSCULAR | Status: DC | PRN
  Administered 2020-12-18: 20 mL via PERINEURAL

## 2020-12-18 MED ORDER — ONDANSETRON 4 MG PO TBDP
4.0000 mg | ORAL_TABLET | Freq: Once | ORAL | Status: AC
  Administered 2020-12-18: 4 mg via ORAL

## 2020-12-18 MED ORDER — MIDAZOLAM HCL 2 MG/2ML IJ SOLN
INTRAMUSCULAR | Status: AC
  Filled 2020-12-18: qty 2

## 2020-12-18 MED ORDER — DEXTROSE 5 % IV SOLN
3.0000 g | INTRAVENOUS | Status: AC
  Administered 2020-12-18: 3 g via INTRAVENOUS

## 2020-12-18 MED ORDER — DEXAMETHASONE SODIUM PHOSPHATE 4 MG/ML IJ SOLN
INTRAMUSCULAR | Status: DC | PRN
  Administered 2020-12-18: 4 mg via INTRAVENOUS

## 2020-12-18 MED ORDER — PROPOFOL 10 MG/ML IV BOLUS
INTRAVENOUS | Status: DC | PRN
  Administered 2020-12-18: 120 ug/kg/min via INTRAVENOUS
  Administered 2020-12-18: 100 mg via INTRAVENOUS
  Administered 2020-12-18: 150 mg via INTRAVENOUS

## 2020-12-18 MED ORDER — ONDANSETRON HCL 4 MG/2ML IJ SOLN: 4.0000 mg | Freq: Once | INTRAMUSCULAR | Status: DC | PRN

## 2020-12-18 SURGICAL SUPPLY — 37 items
BLADE SURG 10 STRL SS (BLADE) ×2 IMPLANT
BLADE SURG 15 STRL LF DISP TIS (BLADE) ×1 IMPLANT
BLADE SURG 15 STRL SS (BLADE) ×2
BNDG COHESIVE 4X5 TAN STRL (GAUZE/BANDAGES/DRESSINGS) IMPLANT
BNDG COHESIVE 6X5 TAN STRL LF (GAUZE/BANDAGES/DRESSINGS) IMPLANT
BNDG GAUZE ELAST 4 BULKY (GAUZE/BANDAGES/DRESSINGS) IMPLANT
COVER BACK TABLE 60X90IN (DRAPES) ×2 IMPLANT
COVER WAND RF STERILE (DRAPES) IMPLANT
DRAPE EXTREMITY T 121X128X90 (DISPOSABLE) ×2 IMPLANT
DRAPE U-SHAPE 47X51 STRL (DRAPES) ×2 IMPLANT
DRSG EMULSION OIL 3X3 NADH (GAUZE/BANDAGES/DRESSINGS) ×2 IMPLANT
DURAPREP 26ML APPLICATOR (WOUND CARE) ×2 IMPLANT
ELECT REM PT RETURN 9FT ADLT (ELECTROSURGICAL) ×2
ELECTRODE REM PT RTRN 9FT ADLT (ELECTROSURGICAL) ×1 IMPLANT
GAUZE 4X4 16PLY RFD (DISPOSABLE) IMPLANT
GAUZE SPONGE 4X4 12PLY STRL (GAUZE/BANDAGES/DRESSINGS) ×2 IMPLANT
GLOVE SURG ENC MOIS LTX SZ7 (GLOVE) ×2 IMPLANT
GLOVE SURG ORTHO LTX SZ9 (GLOVE) ×2 IMPLANT
GLOVE SURG UNDER POLY LF SZ7.5 (GLOVE) ×2 IMPLANT
GLOVE SURG UNDER POLY LF SZ9 (GLOVE) ×2 IMPLANT
GOWN STRL REUS W/ TWL LRG LVL3 (GOWN DISPOSABLE) ×1 IMPLANT
GOWN STRL REUS W/ TWL XL LVL3 (GOWN DISPOSABLE) ×1 IMPLANT
GOWN STRL REUS W/TWL LRG LVL3 (GOWN DISPOSABLE) ×2
GOWN STRL REUS W/TWL XL LVL3 (GOWN DISPOSABLE) ×2
NEEDLE HYPO 25X1 1.5 SAFETY (NEEDLE) ×2 IMPLANT
NEEDLE SPNL 18GX3.5 QUINCKE PK (NEEDLE) IMPLANT
PACK BASIN DAY SURGERY FS (CUSTOM PROCEDURE TRAY) ×2 IMPLANT
PADDING CAST ABS 4INX4YD NS (CAST SUPPLIES) ×1
PADDING CAST ABS COTTON 4X4 ST (CAST SUPPLIES) ×1 IMPLANT
PENCIL SMOKE EVACUATOR (MISCELLANEOUS) ×2 IMPLANT
SPONGE LAP 18X18 RF (DISPOSABLE) ×2 IMPLANT
STOCKINETTE 6  STRL (DRAPES) ×2
STOCKINETTE 6 STRL (DRAPES) ×1 IMPLANT
SUT ETHILON 2 0 FSLX (SUTURE) ×2 IMPLANT
SUT ETHILON 2 0 PSLX (SUTURE) ×2 IMPLANT
SYR BULB EAR ULCER 3OZ GRN STR (SYRINGE) ×2 IMPLANT
SYR CONTROL 10ML LL (SYRINGE) ×2 IMPLANT

## 2020-12-18 NOTE — H&P (Signed)
Rhonda Patton is an 60 y.o. female.   Chief Complaint: Right Achilles Contracture HPI: Patient is a 60 year old woman who is seen for initial evaluation for plantar fasciitis on the right.  Patient states she has had symptoms for 3 months she has worked on stretching exercising immobilization and injections with Dr. Aline Patton and still has pain with activities of daily living she states she has pain with start up pain at night pain with prolonged activities.  Past Medical History:  Diagnosis Date  . Angina    occasional  . Anxiety   . Arthritis   . Carpal tunnel syndrome of right wrist   . Chronic pain   . Diabetes ([place])   . Dyspnea   . GERD (gastroesophageal reflux disease)   . Hypercholesteremia   . Hypertension   . Panic attack   . PONV (postoperative nausea and vomiting)   . Sleep apnea    diagnosed but doesn't use CPAP;     Past Surgical History:  Procedure Laterality Date  . ABDOMINAL HYSTERECTOMY    . BALLOON DILATION N/A 10/09/2020   Procedure: BALLOON DILATION;  Surgeon: Rhonda Harman, DO;  [facility];  Service: Endoscopy;  Laterality: N/A;  . BIOPSY  08/25/2017   Procedure: BIOPSY;  Surgeon: Rhonda Binder, MD;  [facility];  Service: Endoscopy;;  gastric biopsy, gastric polyp  . BIOPSY  10/09/2020   Procedure: BIOPSY;  Surgeon: Rhonda Harman, DO;  [facility];  Service: Endoscopy;;  . CESAREAN SECTION     x 3  . CHOLECYSTECTOMY    . COLONOSCOPY WITH PROPOFOL N/A 08/25/2017   TI appeared normal. 5 mm polyp in rectum. Sessile. Redundant colon. Internal hemorrhoids. Hyperplastic.   Marland Kitchen ESOPHAGOGASTRODUODENOSCOPY (EGD) WITH PROPOFOL N/A 08/25/2017   benign-apearing esophageal stenosis, dilation, small hiatal hernia, mild gastritis due to NSAIDs. Negative H.pylori.   . ESOPHAGOGASTRODUODENOSCOPY (EGD) WITH PROPOFOL N/A 10/09/2020   Z-line irregular, benign-appearing esophageal stenosis, small hiatal hernia, gastritis, normal  duodenum. +Barrett's esophagus, reactive gastropathy with erosions, negative celiac. Surveillance EGD in 2024.   Marland Kitchen FOOT SURGERY Right    x 2-bone spur  . HAND SURGERY Right    x 2-carpal tunnel  . KNEE ARTHROSCOPY Left   . KNEE ARTHROSCOPY WITH MEDIAL MENISECTOMY Left 05/07/2017   Procedure: KNEE ARTHROSCOPY WITH MEDIAL MENISECTOMY AND LATERAL MENISECTOMY, limited debriedment;  Surgeon: Rhonda Civil, MD;  Location: [facility];  Service: Orthopedics;  Laterality: Left;  . PARTIAL HYSTERECTOMY  2001   total abdominal  . POLYPECTOMY  08/25/2017   Procedure: POLYPECTOMY;  Surgeon: Rhonda Binder, MD;  [facility];  Service: Endoscopy;;  rectal polyp cs  . SAVORY DILATION N/A 08/25/2017   Procedure: SAVORY DILATION;  Surgeon: Rhonda Binder, MD;  [facility];  Service: Endoscopy;  Laterality: N/A;  . TOTAL KNEE ARTHROPLASTY Left 09/24/2017   Procedure: TOTAL KNEE ARTHROPLASTY;  Surgeon: Rhonda Civil, MD;  Location: [facility];  Service: Orthopedics;  Laterality: Left;  . TRIGGER FINGER RELEASE Right 11/27/2016   Procedure: RELEASE TRIGGER FINGER/A-1 PULLEY RIGHT LONG FINGER;  Surgeon: Rhonda Civil, MD;  Location: [facility];  Service: Orthopedics;  Laterality: Right;  pt knows to arrive at 8:45    Family History  Problem Relation Age of Onset  . Heart disease Other   . Arthritis Other   . Cancer Other   . Diabetes Other   . Colon cancer Mother 71  . Gastric  cancer Neg Hx   . Esophageal cancer Neg Hx    Social History:  reports that she has never smoked. She has never used smokeless tobacco. She reports that she does not drink alcohol and does not use drugs.  Allergies:  Allergies  Allergen Reactions  . Codeine Shortness Of Breath and Nausea And Vomiting  . Aspirin Nausea Only    No medications prior to admission.    Results for orders placed or performed during the hospital encounter of 12/18/20 (from the past 48 hour(s))  Basic metabolic panel per  protocol     Status: Abnormal   Collection Time: 12/17/20  2:19 PM  Result Value Ref Range   Sodium 139 135 - 145 mmol/L   Potassium 3.7 3.5 - 5.1 mmol/L   Chloride 103 98 - 111 mmol/L   CO2 26 22 - 32 mmol/L   Glucose, Bld 109 (H) 70 - 99 mg/dL    Comment: Glucose reference range applies only to samples taken after fasting for at least 8 hours.   BUN 11 8 - 23 mg/dL   Creatinine, Ser 1.21 (H) 0.44 - 1.00 mg/dL   Calcium 9.4 8.9 - 10.3 mg/dL   GFR, Estimated 51 (L) >60 mL/min    Comment: (NOTE) Calculated using the CKD-EPI Creatinine Equation (2021)    Anion gap 10 5 - 15    Comment: Performed at [facility] [address] [place], [place] [address]   No results found.  Review of Systems  All other systems reviewed and are negative.   Height 5\' 3"  (1.6 m), weight 116.4 kg. Physical Exam  Patient is alert, oriented, no adenopathy, well-dressed, normal affect, normal respiratory effort. Examination patient has a palpable posterior tibial and dorsalis pedis pulse the Doppler was used and she has a triphasic dorsalis pedis and posterior tibial pulse.  She does have an old incision over the midfoot and she states that bone spur was resected she has subjective numbness in the first webspace.  Examination her Achilles tendon is intact with her knee extended she has dorsiflexion only to neutral and this is painful along the Achilles.  She also has pain to palpation of the origin of the plantar fascia there are no nodules no signs of infection.Heart RRR Lungs Clear Assessment/Plan Discussed with the patient a surgical option would be to proceed with a gastrocnemius recession and plantar fascial release discussed that this should be helpful about 75% of the time.  Risks and benefits were discussed including infection neurovascular injury persistent pain need for additional surgery.  Patient states she understands and wished to proceed with surgery recommended a cushioned shoe such as a  Hoka to help unload her plantar fascia.  Follow-Up Instructions: Return if symptoms worsen or fail to improve, for Follow-up 1 week after   [place], PA 12/18/2020, 6:41 AM

## 2020-12-18 NOTE — Anesthesia Procedure Notes (Signed)
Anesthesia Regional Block: Adductor canal block   Pre-Anesthetic Checklist: ,, timeout performed, Correct Patient, Correct Site, Correct Laterality, Correct Procedure, Correct Position, site marked, Risks and benefits discussed,  Surgical consent,  Pre-op evaluation,  At surgeon's request and post-op pain management  Laterality: Lower and Right  Prep: chloraprep       Needles:  Injection technique: Single-shot  Needle Type: Echogenic Needle     Needle Length: 9cm  Needle Gauge: 22     Additional Needles:   Procedures:,,,, ultrasound used (permanent image in chart),,,,  Narrative:  Start time: 12/18/2020 8:13 AM End time: 12/18/2020 8:18 AM Injection made incrementally with aspirations every 5 mL.  Performed by: Personally  Anesthesiologist: Barnet Glasgow, MD  Additional Notes: Block assessed prior to surgery. Pt tolerated procedure well.

## 2020-12-18 NOTE — Anesthesia Procedure Notes (Addendum)
Anesthesia Regional Block: Popliteal block   Pre-Anesthetic Checklist: ,, timeout performed, Correct Patient, Correct Site, Correct Laterality, Correct Procedure, Correct Position, site marked, Risks and benefits discussed, pre-op evaluation,  At surgeon's request and post-op pain management  Laterality: Lower and Right  Prep: Maximum Sterile Barrier Precautions used, chloraprep       Needles:  Injection technique: Single-shot  Needle Type: Echogenic Needle     Needle Length: 9cm  Needle Gauge: 21     Additional Needles:   Procedures:,,,, ultrasound used (permanent image in chart),,,,  Narrative:  Start time: 12/18/2020 8:05 AM End time: 12/18/2020 8:12 AM Injection made incrementally with aspirations every 5 mL.  Performed by: Personally  Anesthesiologist: Barnet Glasgow, MD  Additional Notes: Block assessed. Patient tolerated procedure well.

## 2020-12-18 NOTE — Progress Notes (Signed)
Assisted Dr. Houser with right, ultrasound guided, popliteal block. Side rails up, monitors on throughout procedure. See vital signs in flow sheet. Tolerated Procedure well. 

## 2020-12-18 NOTE — Op Note (Signed)
12/18/2020  9:22 AM  PATIENT:  Rhonda Patton    PRE-OPERATIVE DIAGNOSIS:  Achilles Contracture with Plantar Fascitis Right Foot  POST-OPERATIVE DIAGNOSIS:  Same  PROCEDURE:  RIGHT GASTROCNEMIUS RECSSION AND PLANTAR FASCIA RELEASE  SURGEON:  [name], MD  PHYSICIAN ASSISTANT:None ANESTHESIA:   General  PREOPERATIVE INDICATIONS:  [name] is a  60 y.o. female with a diagnosis of Achilles Contracture with Plantar Fascitis Right Foot who failed conservative measures and elected for surgical management.    The risks benefits and alternatives were discussed with the patient preoperatively including but not limited to the risks of infection, bleeding, nerve injury, cardiopulmonary complications, the need for revision surgery, among others, and the patient was willing to proceed.  OPERATIVE IMPLANTS: None  @ENCIMAGES @  OPERATIVE FINDINGS: C-arm fluoroscopy verified release of the origin of the plantar fascia  OPERATIVE PROCEDURE: Patient was brought the operating room and underwent a regional anesthetic.  After adequate levels anesthesia were obtained patient's right lower extremity was prepped using DuraPrep draped into a sterile field a timeout was called.  A medial longitudinal incision was made 15 cm from the distal aspect of the medial malleolus blunt dissection was carried down to the gastrocnemius fascia and under direct visualization the gastrocnemius fascia was released.  This allowed for dorsiflexion from neutral to about 30 degrees past neutral.  Electrocautery was used hemostasis in the incision was closed using 2-0 nylon.  Attention was then focused on the plantar fascia under C-arm fluoroscopy an 18-gauge needle was used to localize the origin of the plantar fascia a small skin incision was made a 57 Beaver blade was used with guidance from C-arm fluoroscopy to release the origin of the plantar fascia.  Incisions were closed using 2-0 nylon a sterile dressing was  applied patient was taken the PACU in stable condition   DISCHARGE PLANNING:  Antibiotic duration: Preoperative antibiotics  Weightbearing: Weightbearing as tolerated in a Cam walker  Pain medication: Prescription for Vicodin  Dressing care/ Wound VAC: Follow-up in the office in 1 week to change the dressing  Ambulatory devices: Crutches  Discharge to: Home.  Follow-up: In the office 1 week post operative.

## 2020-12-18 NOTE — Anesthesia Postprocedure Evaluation (Signed)
Anesthesia Post Note  Patient: EDWIN CHERIAN  Procedure(s) Performed: RIGHT GASTROCNEMIUS RECSSION AND PLANTAR FASCIA RELEASE (Right Leg Lower)     Patient location during evaluation: PACU Anesthesia Type: General and Regional Level of consciousness: awake and alert Pain management: pain level controlled Vital Signs Assessment: post-procedure vital signs reviewed and stable Respiratory status: spontaneous breathing, nonlabored ventilation, respiratory function stable and patient connected to nasal cannula oxygen Cardiovascular status: blood pressure returned to baseline and stable Postop Assessment: no apparent nausea or vomiting Anesthetic complications: no   No complications documented.  Last Vitals:  Vitals:   12/18/20 1018 12/18/20 1032  BP:  122/72  Pulse: (!) 57 62  Resp: 17 18  Temp:  (!) 36.2 C  SpO2: 97% 95%    Last Pain:  Vitals:   12/18/20 1032  TempSrc:   PainSc: 0-No pain                 Barnet Glasgow

## 2020-12-18 NOTE — Interval H&P Note (Signed)
History and Physical Interval Note:  12/18/2020 6:59 AM  Rhonda Patton  has presented today for surgery, with the diagnosis of Achilles Contracture with Plantar Fascitis Right Foot.  The various methods of treatment have been discussed with the patient and family. After consideration of risks, benefits and other options for treatment, the patient has consented to  Procedure(s): [place] (Right) as a surgical intervention.  The patient's history has been reviewed, patient examined, no change in status, stable for surgery.  I have reviewed the patient's chart and labs.  Questions were answered to the patient's satisfaction.     Rhonda Patton

## 2020-12-18 NOTE — Discharge Instructions (Signed)
°  Post Anesthesia Home Care Instructions  Activity: Get plenty of rest for the remainder of the day. A responsible individual must stay with you for 24 hours following the procedure.  For the next 24 hours, DO NOT: -Drive a car -Paediatric nurse -Drink alcoholic beverages -Take any medication unless instructed by your physician -Make any legal decisions or sign important papers.  Meals: Start with liquid foods such as gelatin or soup. Progress to regular foods as tolerated. Avoid greasy, spicy, heavy foods. If nausea and/or vomiting occur, drink only clear liquids until the nausea and/or vomiting subsides. Call your physician if vomiting continues.  Special Instructions/Symptoms: Your throat may feel dry or sore from the anesthesia or the breathing tube placed in your throat during surgery. If this causes discomfort, gargle with warm salt water. The discomfort should disappear within 24 hours.  If you had a scopolamine patch placed behind your ear for the management of post- operative nausea and/or vomiting:  1. The medication in the patch is effective for 72 hours, after which it should be removed.  Wrap patch in a tissue and discard in the trash. Wash hands thoroughly with soap and water. 2. You may remove the patch earlier than 72 hours if you experience unpleasant side effects which may include dry mouth, dizziness or visual disturbances. 3. Avoid touching the patch. Wash your hands with soap and water after contact with the patch.       Call your surgeon if you experience:   1.  Fever over 101.0. 2.  Inability to urinate. 3.  Nausea and/or vomiting. 4.  Extreme swelling or bruising at the surgical site. 5.  Continued bleeding from the incision. 6.  Increased pain, redness or drainage from the incision. 7.  Problems related to your pain medication. 8.  Any problems and/or concerns    Regional Anesthesia Blocks  1. Numbness or the inability to move the "blocked" extremity  may last from 3-48 hours after placement. The length of time depends on the medication injected and your individual response to the medication. If the numbness is not going away after 48 hours, call your surgeon.  2. The extremity that is blocked will need to be protected until the numbness is gone and the  Strength has returned. Because you cannot feel it, you will need to take extra care to avoid injury. Because it may be weak, you may have difficulty moving it or using it. You may not know what position it is in without looking at it while the block is in effect.  3. For blocks in the legs and feet, returning to weight bearing and walking needs to be done carefully. You will need to wait until the numbness is entirely gone and the strength has returned. You should be able to move your leg and foot normally before you try and bear weight or walk. You will need someone to be with you when you first try to ensure you do not fall and possibly risk injury.  4. Bruising and tenderness at the needle site are common side effects and will resolve in a few days.  5. Persistent numbness or new problems with movement should be communicated to the surgeon or the [facility] [phone] [place] [phone]).

## 2020-12-18 NOTE — Anesthesia Procedure Notes (Signed)
Procedure Name: LMA Insertion Performed by: Ezequiel Kayser, CRNA Pre-anesthesia Checklist: Patient identified, Emergency Drugs available, Suction available and Patient being monitored Patient Re-evaluated:Patient Re-evaluated prior to induction Oxygen Delivery Method: Circle System Utilized Preoxygenation: Pre-oxygenation with 100% oxygen Induction Type: IV induction Ventilation: Mask ventilation without difficulty LMA: LMA inserted LMA Size: 4.0 Number of attempts: 1 Airway Equipment and Method: Bite block Placement Confirmation: positive ETCO2 Tube secured with: Tape Dental Injury: Teeth and Oropharynx as per pre-operative assessment  Comments: Eyes taped prior to insertion

## 2020-12-18 NOTE — Progress Notes (Signed)
AssistedDr. Houser with right, ultrasound guided, adductor canal block. Side rails up, monitors on throughout procedure. See vital signs in flow sheet. Tolerated Procedure well.  

## 2020-12-18 NOTE — Transfer of Care (Signed)
Immediate Anesthesia Transfer of Care Note  Patient: Rhonda Patton  Procedure(s) Performed: RIGHT GASTROCNEMIUS RECSSION AND PLANTAR FASCIA RELEASE (Right Leg Lower)  Patient Location: PACU  Anesthesia Type:GA combined with regional for post-op pain  Level of Consciousness: awake, alert  and oriented  Airway & Oxygen Therapy: Patient Spontanous Breathing and Patient connected to face mask oxygen  Post-op Assessment: Report given to RN and Post -op Vital signs reviewed and stable  Post vital signs: Reviewed and stable  Last Vitals:  Vitals Value Taken Time  BP    Temp    Pulse 61 12/18/20 0913  Resp 24 12/18/20 0913  SpO2 100 % 12/18/20 0913  Vitals shown include unvalidated device data.  Last Pain:  Vitals:   12/18/20 0736  TempSrc: Oral  PainSc: 9       Patients Stated Pain Goal: 3 (16/10/96 0454)  Complications: No complications documented.

## 2020-12-19 ENCOUNTER — Encounter (HOSPITAL_BASED_OUTPATIENT_CLINIC_OR_DEPARTMENT_OTHER): Payer: Self-pay | Admitting: Orthopedic Surgery

## 2020-12-24 ENCOUNTER — Other Ambulatory Visit: Payer: Self-pay | Admitting: Orthopedic Surgery

## 2020-12-24 DIAGNOSIS — G8929 Other chronic pain: Secondary | ICD-10-CM

## 2020-12-25 ENCOUNTER — Other Ambulatory Visit: Payer: Self-pay | Admitting: Physician Assistant

## 2020-12-25 ENCOUNTER — Encounter: Payer: Self-pay | Admitting: Orthopedic Surgery

## 2020-12-25 ENCOUNTER — Ambulatory Visit (INDEPENDENT_AMBULATORY_CARE_PROVIDER_SITE_OTHER): Payer: 59 | Admitting: Physician Assistant

## 2020-12-25 DIAGNOSIS — M722 Plantar fascial fibromatosis: Secondary | ICD-10-CM

## 2020-12-25 MED ORDER — HYDROCODONE-ACETAMINOPHEN 5-325 MG PO TABS: 1.0000 | ORAL_TABLET | Freq: Four times a day (QID) | ORAL | 0 refills | Status: DC | PRN

## 2020-12-25 NOTE — Progress Notes (Unsigned)
Office Visit Note   Patient: Rhonda Patton           Date of Birth: Apr 03, 1961           MRN: [id number] Visit Date: 12/25/2020              Requested by: No referring provider defined for this encounter. PCP: Celene Squibb, MD  No chief complaint on file.     HPI: The patient is a 60 year old woman who is 1 week status post right gastrocnemius recession plantar fascia release.  She did not realize she had a wound on the calf and was wondering why she hurt denies any calf pain  Assessment & Plan: Visit Diagnoses: No diagnosis found.  Plan: May begin daily cleansing with Dial soap and water.  I emphasized the importance of elevating her foot.  We will follow-up next week for suture removal.  Have called in a refill of her hydrocodone which she did not tolerate  Follow-Up Instructions: No follow-ups on file.   Ortho Exam  Patient is alert, oriented, no adenopathy, well-dressed, normal affect, normal respiratory effort. Focused examination demonstrates well-healing surgical incision well apposed wound edges without drainage surgical sutures are intact.  Also suture at the plantar fascial site is also healing well no ascending cellulitis negative Bevelyn Buckles' sign compartments are soft and compressible  Imaging: No results found. No images are attached to the encounter.  Labs: Lab Results  Component Value Date   HGBA1C 5.9 (H) 08/20/2017   LABORGA Insignificant Growth 08/24/2014     Lab Results  Component Value Date   ALBUMIN 4.4 09/22/2017   ALBUMIN 3.8 08/24/2014    No results found for: MG No results found for: VD25OH  No results found for: PREALBUMIN CBC EXTENDED Latest Ref Rng & Units 11/14/2020 09/27/2017 09/26/2017  WBC 3.8 - 10.8 Thousand/uL 5.0 9.0 9.7  RBC 3.80 - 5.10 Million/uL 4.89 3.97 4.08  HGB 11.7 - 15.5 g/dL 13.9 11.4(L) 11.6(L)  HCT 35.0 - 45.0 % 41.9 35.0(L) 35.9(L)  PLT 140 - 400 Thousand/uL 207 169 173  NEUTROABS 1,500 - 7,800 cells/uL 2,640 -  -  LYMPHSABS 850 - 3,900 cells/uL 1,585 - -     There is no height or weight on file to calculate BMI.  Orders:  No orders of the defined types were placed in this encounter.  Meds ordered this encounter  Medications  . HYDROcodone-acetaminophen (NORCO/VICODIN) 5-325 MG tablet    Sig: Take 1 tablet by mouth every 6 (six) hours as needed for moderate pain.    Dispense:  30 tablet    Refill:  0     Procedures: No procedures performed  Clinical Data: No additional findings.  ROS:  All other systems negative, except as noted in the HPI. Review of Systems  Objective: Vital Signs: There were no vitals taken for this visit.  Specialty Comments:  No specialty comments available.  PMFS History: Patient Active Problem List   Diagnosis Date Noted  . Achilles tendon contracture, right   . Plantar fasciitis   . S/P total knee replacement, left 09/24/17 09/24/2017  . Rectal polyp   . Gastritis due to nonsteroidal anti-inflammatory drug   . Stricture and stenosis of esophagus   . GERD (gastroesophageal reflux disease) 07/28/2017  . Rectal bleeding 07/28/2017  . Dysphagia 07/28/2017  . Abdominal pain, epigastric 07/28/2017  . Trigger finger, acquired   . OA (osteoarthritis) of knee 10/07/2012  . Left leg weakness 03/17/2012  . Difficulty  in walking(719.7) 03/17/2012  . CARPAL TUNNEL SYNDROME 05/01/2010  . MITRAL REGURGITATION 07/20/2009  . AORTIC INSUFFICIENCY 07/20/2009  . SHORTNESS OF BREATH 06/20/2009  . CHEST PAIN-UNSPECIFIED 05/31/2009   Past Medical History:  Diagnosis Date  . Angina    occasional  . Anxiety   . Arthritis   . Carpal tunnel syndrome of right wrist   . Chronic pain   . Diabetes ([place])   . Dyspnea   . GERD (gastroesophageal reflux disease)   . Hypercholesteremia   . Hypertension   . Panic attack   . PONV (postoperative nausea and vomiting)   . Sleep apnea    diagnosed but doesn't use CPAP;     Family History  Problem Relation Age of Onset   . Heart disease Other   . Arthritis Other   . Cancer Other   . Diabetes Other   . Colon cancer Mother 84  . Gastric cancer Neg Hx   . Esophageal cancer Neg Hx     Past Surgical History:  Procedure Laterality Date  . ABDOMINAL HYSTERECTOMY    . BALLOON DILATION N/A 10/09/2020   Procedure: BALLOON DILATION;  Surgeon: Eloise Harman, DO;  [facility];  Service: Endoscopy;  Laterality: N/A;  . BIOPSY  08/25/2017   Procedure: BIOPSY;  Surgeon: Danie Binder, MD;  [facility];  Service: Endoscopy;;  gastric biopsy, gastric polyp  . BIOPSY  10/09/2020   Procedure: BIOPSY;  Surgeon: Eloise Harman, DO;  [facility];  Service: Endoscopy;;  . CESAREAN SECTION     x 3  . CHOLECYSTECTOMY    . COLONOSCOPY WITH PROPOFOL N/A 08/25/2017   TI appeared normal. 5 mm polyp in rectum. Sessile. Redundant colon. Internal hemorrhoids. Hyperplastic.   Marland Kitchen ESOPHAGOGASTRODUODENOSCOPY (EGD) WITH PROPOFOL N/A 08/25/2017   benign-apearing esophageal stenosis, dilation, small hiatal hernia, mild gastritis due to NSAIDs. Negative H.pylori.   . ESOPHAGOGASTRODUODENOSCOPY (EGD) WITH PROPOFOL N/A 10/09/2020   Z-line irregular, benign-appearing esophageal stenosis, small hiatal hernia, gastritis, normal duodenum. +Barrett's esophagus, reactive gastropathy with erosions, negative celiac. Surveillance EGD in 2024.   Marland Kitchen FOOT SURGERY Right    x 2-bone spur  . GASTROCNEMIUS RECESSION Right 12/18/2020   Procedure: RIGHT GASTROCNEMIUS RECSSION AND PLANTAR FASCIA RELEASE;  Surgeon: [name], MD;  Location: [place];  Service: Orthopedics;  Laterality: Right;  . HAND SURGERY Right    x 2-carpal tunnel  . KNEE ARTHROSCOPY Left   . KNEE ARTHROSCOPY WITH MEDIAL MENISECTOMY Left 05/07/2017   Procedure: KNEE ARTHROSCOPY WITH MEDIAL MENISECTOMY AND LATERAL MENISECTOMY, limited debriedment;  Surgeon: Carole Civil, MD;  [facility];  Service: Orthopedics;   Laterality: Left;  . PARTIAL HYSTERECTOMY  2001   total abdominal  . POLYPECTOMY  08/25/2017   Procedure: POLYPECTOMY;  Surgeon: Danie Binder, MD;  [facility];  Service: Endoscopy;;  rectal polyp cs  . SAVORY DILATION N/A 08/25/2017   Procedure: SAVORY DILATION;  Surgeon: Danie Binder, MD;  [facility];  Service: Endoscopy;  Laterality: N/A;  . TOTAL KNEE ARTHROPLASTY Left 09/24/2017   Procedure: TOTAL KNEE ARTHROPLASTY;  Surgeon: Carole Civil, MD;  [facility];  Service: Orthopedics;  Laterality: Left;  . TRIGGER FINGER RELEASE Right 11/27/2016   Procedure: RELEASE TRIGGER FINGER/A-1 PULLEY RIGHT LONG FINGER;  Surgeon: Carole Civil, MD;  [facility];  Service: Orthopedics;  Laterality: Right;  pt knows to arrive at 8:45  Social History   Occupational History  . Not on file  Tobacco Use  . Smoking status: Never Smoker  . Smokeless tobacco: Never Used  Vaping Use  . Vaping Use: Never used  Substance and Sexual Activity  . Alcohol use: No  . Drug use: No  . Sexual activity: Not Currently    Birth control/protection: Surgical

## 2021-01-03 ENCOUNTER — Encounter: Payer: Self-pay | Admitting: Orthopedic Surgery

## 2021-01-03 ENCOUNTER — Other Ambulatory Visit: Payer: Self-pay

## 2021-01-03 ENCOUNTER — Ambulatory Visit (INDEPENDENT_AMBULATORY_CARE_PROVIDER_SITE_OTHER): Payer: 59 | Admitting: Physician Assistant

## 2021-01-03 DIAGNOSIS — M722 Plantar fascial fibromatosis: Secondary | ICD-10-CM

## 2021-01-03 NOTE — Progress Notes (Signed)
Office Visit Note   Patient: Rhonda Patton           Date of Birth: May 25, 1961           MRN: [id number] Visit Date: 01/03/2021              Requested by: Celene Squibb, MD [address] [place],  [place] [id number] PCP: Celene Squibb, MD  Chief Complaint  Patient presents with  . Right Leg - Routine Post Op    01/03/21 right gastroc recession and PF release   . Right Foot - Routine Post Op      HPI: Patient is a pleasant 60 year old woman who is little over 2 weeks status post right gastrocnemius recession and plantar fascial release.  She has some aching in her calf that goes from her Achilles up.  Otherwise no complaints.  She said her biggest concern right now is actually her right knee.  She has a history of a left knee replacement.  This was done by Dr. Aline Brochure.  She is now having increasing right knee pain.  She said that Dr. Aline Brochure to get did give her an injection which did not help at all  Assessment & Plan: Visit Diagnoses: No diagnosis found.  Plan: I reviewed the patient's x-rays and she does have advanced osteoarthritis of her right knee especially in the medial compartment and patellofemoral joint.  I have recommended Voltaren gel.  Certainly I had be happy to inject her one more time.  Ultimately I expect she will probably need a knee replacement she will begin gentle Achilles and plantar fascia stretching.  Follow-up in 2 weeks. Follow-Up Instructions: No follow-ups on file.   Ortho Exam  Patient is alert, oriented, no adenopathy, well-dressed, normal affect, normal respiratory effort. Examination well-healed surgical incisions.  No ascending cellulitis no redness no swelling in her calf.  No significant tenderness in her calf.  Negative Homans' sign and actually passive stretch feels better to her.  Compartments are soft no evidence of infection  Imaging: No results found. No images are attached to the encounter.  Labs: Lab Results  Component Value  Date   HGBA1C 5.9 (H) 08/20/2017   LABORGA Insignificant Growth 08/24/2014     Lab Results  Component Value Date   ALBUMIN 4.4 09/22/2017   ALBUMIN 3.8 08/24/2014    No results found for: MG No results found for: VD25OH  No results found for: PREALBUMIN CBC EXTENDED Latest Ref Rng & Units 11/14/2020 09/27/2017 09/26/2017  WBC 3.8 - 10.8 Thousand/uL 5.0 9.0 9.7  RBC 3.80 - 5.10 Million/uL 4.89 3.97 4.08  HGB 11.7 - 15.5 g/dL 13.9 11.4(L) 11.6(L)  HCT 35.0 - 45.0 % 41.9 35.0(L) 35.9(L)  PLT 140 - 400 Thousand/uL 207 169 173  NEUTROABS 1,500 - 7,800 cells/uL 2,640 - -  LYMPHSABS 850 - 3,900 cells/uL 1,585 - -     There is no height or weight on file to calculate BMI.  Orders:  No orders of the defined types were placed in this encounter.  No orders of the defined types were placed in this encounter.    Procedures: No procedures performed  Clinical Data: No additional findings.  ROS:  All other systems negative, except as noted in the HPI. Review of Systems  Objective: Vital Signs: There were no vitals taken for this visit.  Specialty Comments:  No specialty comments available.  PMFS History: Patient Active Problem List   Diagnosis Date Noted  .  Achilles tendon contracture, right   . Plantar fasciitis   . S/P total knee replacement, left 09/24/17 09/24/2017  . Rectal polyp   . Gastritis due to nonsteroidal anti-inflammatory drug   . Stricture and stenosis of esophagus   . GERD (gastroesophageal reflux disease) 07/28/2017  . Rectal bleeding 07/28/2017  . Dysphagia 07/28/2017  . Abdominal pain, epigastric 07/28/2017  . Trigger finger, acquired   . OA (osteoarthritis) of knee 10/07/2012  . Left leg weakness 03/17/2012  . Difficulty in walking(719.7) 03/17/2012  . CARPAL TUNNEL SYNDROME 05/01/2010  . MITRAL REGURGITATION 07/20/2009  . AORTIC INSUFFICIENCY 07/20/2009  . SHORTNESS OF BREATH 06/20/2009  . CHEST PAIN-UNSPECIFIED 05/31/2009   Past Medical  History:  Diagnosis Date  . Angina    occasional  . Anxiety   . Arthritis   . Carpal tunnel syndrome of right wrist   . Chronic pain   . Diabetes ([place])   . Dyspnea   . GERD (gastroesophageal reflux disease)   . Hypercholesteremia   . Hypertension   . Panic attack   . PONV (postoperative nausea and vomiting)   . Sleep apnea    diagnosed but doesn't use CPAP;     Family History  Problem Relation Age of Onset  . Heart disease Other   . Arthritis Other   . Cancer Other   . Diabetes Other   . Colon cancer Mother 38  . Gastric cancer Neg Hx   . Esophageal cancer Neg Hx     Past Surgical History:  Procedure Laterality Date  . ABDOMINAL HYSTERECTOMY    . BALLOON DILATION N/A 10/09/2020   Procedure: BALLOON DILATION;  Surgeon: Eloise Harman, DO;  [facility];  Service: Endoscopy;  Laterality: N/A;  . BIOPSY  08/25/2017   Procedure: BIOPSY;  Surgeon: Danie Binder, MD;  [facility];  Service: Endoscopy;;  gastric biopsy, gastric polyp  . BIOPSY  10/09/2020   Procedure: BIOPSY;  Surgeon: Eloise Harman, DO;  [facility];  Service: Endoscopy;;  . CESAREAN SECTION     x 3  . CHOLECYSTECTOMY    . COLONOSCOPY WITH PROPOFOL N/A 08/25/2017   TI appeared normal. 5 mm polyp in rectum. Sessile. Redundant colon. Internal hemorrhoids. Hyperplastic.   Marland Kitchen ESOPHAGOGASTRODUODENOSCOPY (EGD) WITH PROPOFOL N/A 08/25/2017   benign-apearing esophageal stenosis, dilation, small hiatal hernia, mild gastritis due to NSAIDs. Negative H.pylori.   . ESOPHAGOGASTRODUODENOSCOPY (EGD) WITH PROPOFOL N/A 10/09/2020   Z-line irregular, benign-appearing esophageal stenosis, small hiatal hernia, gastritis, normal duodenum. +Barrett's esophagus, reactive gastropathy with erosions, negative celiac. Surveillance EGD in 2024.   Marland Kitchen FOOT SURGERY Right    x 2-bone spur  . GASTROCNEMIUS RECESSION Right 12/18/2020   Procedure: RIGHT GASTROCNEMIUS RECSSION AND PLANTAR FASCIA RELEASE;   Surgeon: [name], MD;  Location: [place];  Service: Orthopedics;  Laterality: Right;  . HAND SURGERY Right    x 2-carpal tunnel  . KNEE ARTHROSCOPY Left   . KNEE ARTHROSCOPY WITH MEDIAL MENISECTOMY Left 05/07/2017   Procedure: KNEE ARTHROSCOPY WITH MEDIAL MENISECTOMY AND LATERAL MENISECTOMY, limited debriedment;  Surgeon: Carole Civil, MD;  [facility];  Service: Orthopedics;  Laterality: Left;  . PARTIAL HYSTERECTOMY  2001   total abdominal  . POLYPECTOMY  08/25/2017   Procedure: POLYPECTOMY;  Surgeon: Danie Binder, MD;  [facility];  Service: Endoscopy;;  rectal polyp cs  . SAVORY DILATION N/A 08/25/2017   Procedure: SAVORY DILATION;  Surgeon: Oneida Alar,  Marga Melnick, MD;  [facility];  Service: Endoscopy;  Laterality: N/A;  . TOTAL KNEE ARTHROPLASTY Left 09/24/2017   Procedure: TOTAL KNEE ARTHROPLASTY;  Surgeon: Carole Civil, MD;  [facility];  Service: Orthopedics;  Laterality: Left;  . TRIGGER FINGER RELEASE Right 11/27/2016   Procedure: RELEASE TRIGGER FINGER/A-1 PULLEY RIGHT LONG FINGER;  Surgeon: Carole Civil, MD;  [facility];  Service: Orthopedics;  Laterality: Right;  pt knows to arrive at 8:45   Social History   Occupational History  . Not on file  Tobacco Use  . Smoking status: Never Smoker  . Smokeless tobacco: Never Used  Vaping Use  . Vaping Use: Never used  Substance and Sexual Activity  . Alcohol use: No  . Drug use: No  . Sexual activity: Not Currently    Birth control/protection: Surgical

## 2021-01-03 NOTE — Progress Notes (Signed)
Office Visit Note   Patient: Rhonda Patton           Date of Birth: Feb 24, 1961           MRN: [id number] Visit Date: 12/25/2020              Requested by: Celene Squibb, MD [address] [place],  [place] [id number] PCP: Celene Squibb, MD  Chief Complaint  Patient presents with  . Right Foot - Routine Post Op      HPI: Patient presents today status post plantar fascial release and gastrocnemius recession overall she is doing well no complaints  Assessment & Plan: Visit Diagnoses:  1. Plantar fasciitis of right foot     Plan: May begin daily cleansing with antibacterial soap and water follow-up in 1 week  Follow-Up Instructions: Return in about 1 week (around 01/01/2021).   Ortho Exam  Patient is alert, oriented, no adenopathy, well-dressed, normal affect, normal respiratory effort.  Examination demonstrates healing surgical incisions overall well apposed wound edges no ascending cellulitis swelling is fairly well controlled  Imaging: No results found. No images are attached to the encounter.  Labs: Lab Results  Component Value Date   HGBA1C 5.9 (H) 08/20/2017   LABORGA Insignificant Growth 08/24/2014     Lab Results  Component Value Date   ALBUMIN 4.4 09/22/2017   ALBUMIN 3.8 08/24/2014    No results found for: MG No results found for: VD25OH  No results found for: PREALBUMIN CBC EXTENDED Latest Ref Rng & Units 11/14/2020 09/27/2017 09/26/2017  WBC 3.8 - 10.8 Thousand/uL 5.0 9.0 9.7  RBC 3.80 - 5.10 Million/uL 4.89 3.97 4.08  HGB 11.7 - 15.5 g/dL 13.9 11.4(L) 11.6(L)  HCT 35.0 - 45.0 % 41.9 35.0(L) 35.9(L)  PLT 140 - 400 Thousand/uL 207 169 173  NEUTROABS 1,500 - 7,800 cells/uL 2,640 - -  LYMPHSABS 850 - 3,900 cells/uL 1,585 - -     There is no height or weight on file to calculate BMI.  Orders:  No orders of the defined types were placed in this encounter.  No orders of the defined types were placed in this encounter.    Procedures: No  procedures performed  Clinical Data: No additional findings.  ROS:  All other systems negative, except as noted in the HPI. Review of Systems  Objective: Vital Signs: There were no vitals taken for this visit.  Specialty Comments:  No specialty comments available.  PMFS History: Patient Active Problem List   Diagnosis Date Noted  . Achilles tendon contracture, right   . Plantar fasciitis   . S/P total knee replacement, left 09/24/17 09/24/2017  . Rectal polyp   . Gastritis due to nonsteroidal anti-inflammatory drug   . Stricture and stenosis of esophagus   . GERD (gastroesophageal reflux disease) 07/28/2017  . Rectal bleeding 07/28/2017  . Dysphagia 07/28/2017  . Abdominal pain, epigastric 07/28/2017  . Trigger finger, acquired   . OA (osteoarthritis) of knee 10/07/2012  . Left leg weakness 03/17/2012  . Difficulty in walking(719.7) 03/17/2012  . CARPAL TUNNEL SYNDROME 05/01/2010  . MITRAL REGURGITATION 07/20/2009  . AORTIC INSUFFICIENCY 07/20/2009  . SHORTNESS OF BREATH 06/20/2009  . CHEST PAIN-UNSPECIFIED 05/31/2009   Past Medical History:  Diagnosis Date  . Angina    occasional  . Anxiety   . Arthritis   . Carpal tunnel syndrome of right wrist   . Chronic pain   . Diabetes ([place])   . Dyspnea   .  GERD (gastroesophageal reflux disease)   . Hypercholesteremia   . Hypertension   . Panic attack   . PONV (postoperative nausea and vomiting)   . Sleep apnea    diagnosed but doesn't use CPAP;     Family History  Problem Relation Age of Onset  . Heart disease Other   . Arthritis Other   . Cancer Other   . Diabetes Other   . Colon cancer Mother 62  . Gastric cancer Neg Hx   . Esophageal cancer Neg Hx     Past Surgical History:  Procedure Laterality Date  . ABDOMINAL HYSTERECTOMY    . BALLOON DILATION N/A 10/09/2020   Procedure: BALLOON DILATION;  Surgeon: Eloise Harman, DO;  [facility];  Service: Endoscopy;  Laterality: N/A;  . BIOPSY   08/25/2017   Procedure: BIOPSY;  Surgeon: Danie Binder, MD;  [facility];  Service: Endoscopy;;  gastric biopsy, gastric polyp  . BIOPSY  10/09/2020   Procedure: BIOPSY;  Surgeon: Eloise Harman, DO;  [facility];  Service: Endoscopy;;  . CESAREAN SECTION     x 3  . CHOLECYSTECTOMY    . COLONOSCOPY WITH PROPOFOL N/A 08/25/2017   TI appeared normal. 5 mm polyp in rectum. Sessile. Redundant colon. Internal hemorrhoids. Hyperplastic.   Marland Kitchen ESOPHAGOGASTRODUODENOSCOPY (EGD) WITH PROPOFOL N/A 08/25/2017   benign-apearing esophageal stenosis, dilation, small hiatal hernia, mild gastritis due to NSAIDs. Negative H.pylori.   . ESOPHAGOGASTRODUODENOSCOPY (EGD) WITH PROPOFOL N/A 10/09/2020   Z-line irregular, benign-appearing esophageal stenosis, small hiatal hernia, gastritis, normal duodenum. +Barrett's esophagus, reactive gastropathy with erosions, negative celiac. Surveillance EGD in 2024.   Marland Kitchen FOOT SURGERY Right    x 2-bone spur  . GASTROCNEMIUS RECESSION Right 12/18/2020   Procedure: RIGHT GASTROCNEMIUS RECSSION AND PLANTAR FASCIA RELEASE;  Surgeon: [name], MD;  Location: [facility];  Service: Orthopedics;  Laterality: Right;  . HAND SURGERY Right    x 2-carpal tunnel  . KNEE ARTHROSCOPY Left   . KNEE ARTHROSCOPY WITH MEDIAL MENISECTOMY Left 05/07/2017   Procedure: KNEE ARTHROSCOPY WITH MEDIAL MENISECTOMY AND LATERAL MENISECTOMY, limited debriedment;  Surgeon: Carole Civil, MD;  [facility];  Service: Orthopedics;  Laterality: Left;  . PARTIAL HYSTERECTOMY  2001   total abdominal  . POLYPECTOMY  08/25/2017   Procedure: POLYPECTOMY;  Surgeon: Danie Binder, MD;  [facility];  Service: Endoscopy;;  rectal polyp cs  . SAVORY DILATION N/A 08/25/2017   Procedure: SAVORY DILATION;  Surgeon: Danie Binder, MD;  [facility];  Service: Endoscopy;  Laterality: N/A;  . TOTAL KNEE ARTHROPLASTY Left 09/24/2017   Procedure:  TOTAL KNEE ARTHROPLASTY;  Surgeon: Carole Civil, MD;  [facility];  Service: Orthopedics;  Laterality: Left;  . TRIGGER FINGER RELEASE Right 11/27/2016   Procedure: RELEASE TRIGGER FINGER/A-1 PULLEY RIGHT LONG FINGER;  Surgeon: Carole Civil, MD;  [facility];  Service: Orthopedics;  Laterality: Right;  pt knows to arrive at 8:45   Social History   Occupational History  . Not on file  Tobacco Use  . Smoking status: Never Smoker  . Smokeless tobacco: Never Used  Vaping Use  . Vaping Use: Never used  Substance and Sexual Activity  . Alcohol use: No  . Drug use: No  . Sexual activity: Not Currently    Birth control/protection: Surgical

## 2021-01-04 ENCOUNTER — Telehealth: Payer: Self-pay | Admitting: Orthopedic Surgery

## 2021-01-04 NOTE — Telephone Encounter (Signed)
You saw this pt in the office yesterday please see message below and advise if there are any restrictions for this pt?

## 2021-01-04 NOTE — Telephone Encounter (Signed)
I spoke with the patient.  She works as an Engineer, production.  I told her this was not realistic to be able to do physical lifting transferring of a client at this time.  Also she asked about driving I said we could talk about it at her next visit but again because it is her right leg I would restrict her from driving.  She understands I told her if there is any paperwork she needs filled out for her job to let us know

## 2021-01-04 NOTE — Telephone Encounter (Signed)
Patient called. Would like her restrictions. Her call back number is [phone]

## 2021-01-09 ENCOUNTER — Other Ambulatory Visit: Payer: Self-pay | Admitting: Physician Assistant

## 2021-01-09 ENCOUNTER — Telehealth: Payer: Self-pay | Admitting: Orthopedic Surgery

## 2021-01-09 MED ORDER — HYDROCODONE-ACETAMINOPHEN 5-325 MG PO TABS
1.0000 | ORAL_TABLET | Freq: Four times a day (QID) | ORAL | 0 refills | Status: DC | PRN
Start: 2021-01-09 — End: 2021-02-07

## 2021-01-09 NOTE — Telephone Encounter (Signed)
Pt called and would like to know if she can get a refill on hydrocodone.

## 2021-01-09 NOTE — Telephone Encounter (Signed)
Done

## 2021-01-09 NOTE — Telephone Encounter (Signed)
Pt had gastroc and PF release 01/03/21 requesting refill on pain medication given 12/25/20 # 30 please advise.

## 2021-01-17 ENCOUNTER — Ambulatory Visit: Payer: 59 | Admitting: Orthopedic Surgery

## 2021-01-21 ENCOUNTER — Other Ambulatory Visit: Payer: Self-pay

## 2021-01-21 ENCOUNTER — Ambulatory Visit (INDEPENDENT_AMBULATORY_CARE_PROVIDER_SITE_OTHER): Payer: 59 | Admitting: Orthopedic Surgery

## 2021-01-21 ENCOUNTER — Encounter: Payer: Self-pay | Admitting: Orthopedic Surgery

## 2021-01-21 VITALS — Ht 63.0 in | Wt 255.0 lb

## 2021-01-21 DIAGNOSIS — M722 Plantar fascial fibromatosis: Secondary | ICD-10-CM

## 2021-01-21 DIAGNOSIS — M6701 Short Achilles tendon (acquired), right ankle: Secondary | ICD-10-CM

## 2021-01-22 ENCOUNTER — Encounter: Payer: Self-pay | Admitting: Orthopedic Surgery

## 2021-01-22 ENCOUNTER — Telehealth: Payer: Self-pay

## 2021-01-22 NOTE — Telephone Encounter (Signed)
Patient called she has questions regarding whether she when she can drive and when she will be able to return to work call back:[phone]

## 2021-01-22 NOTE — Progress Notes (Signed)
Office Visit Note   Patient: Rhonda Patton           Date of Birth: 1960/12/10           MRN: [id number] Visit Date: 01/21/2021              Requested by: Celene Squibb, MD [address] [place],  [place] [address] PCP: Celene Squibb, MD  Chief Complaint  Patient presents with  . Right Foot - Follow-up    Right gastroc recession and plantar fascia release 01/03/2021      HPI: Patient is a 60 year old woman who is status post right gastrocnemius recession and plantar fascial release approximately 2-1/2 weeks ago.  Patient states she does have pain at night she is doing her home exercises.  Assessment & Plan: Visit Diagnoses:  1. Plantar fasciitis of right foot   2. Achilles tendon contracture, right     Plan: Patient was given a double extra-large compression sock recommended continuing with calf range of motion.  Patient states that she had an injection with Dr. Aline Brochure 2 months ago and she will follow up with Dr. Aline Brochure if she wishes to proceed with surgical intervention for the right knee.  Follow-Up Instructions: Return in about 3 weeks (around 02/11/2021).   Ortho Exam  Patient is alert, oriented, no adenopathy, well-dressed, normal affect, normal respiratory effort. Examination patient's calf is 48 cm in circumference she has good dorsiflexion of the ankle about 30 degrees the incision is well-healed the plantar fascia is nontender to palpation.  Patient will continue to increase her activities as tolerated she is given instructions on scar massage.  Imaging: No results found. No images are attached to the encounter.  Labs: Lab Results  Component Value Date   HGBA1C 5.9 (H) 08/20/2017   LABORGA Insignificant Growth 08/24/2014     Lab Results  Component Value Date   ALBUMIN 4.4 09/22/2017   ALBUMIN 3.8 08/24/2014    No results found for: MG No results found for: VD25OH  No results found for: PREALBUMIN CBC EXTENDED Latest Ref Rng & Units  11/14/2020 09/27/2017 09/26/2017  WBC 3.8 - 10.8 Thousand/uL 5.0 9.0 9.7  RBC 3.80 - 5.10 Million/uL 4.89 3.97 4.08  HGB 11.7 - 15.5 g/dL 13.9 11.4(L) 11.6(L)  HCT 35.0 - 45.0 % 41.9 35.0(L) 35.9(L)  PLT 140 - 400 Thousand/uL 207 169 173  NEUTROABS 1,500 - 7,800 cells/uL 2,640 - -  LYMPHSABS 850 - 3,900 cells/uL 1,585 - -     Body mass index is 45.17 kg/m.  Orders:  No orders of the defined types were placed in this encounter.  No orders of the defined types were placed in this encounter.    Procedures: No procedures performed  Clinical Data: No additional findings.  ROS:  All other systems negative, except as noted in the HPI. Review of Systems  Objective: Vital Signs: Ht 5\' 3"  (1.6 m)   Wt 255 lb (115.7 kg)   BMI 45.17 kg/m   Specialty Comments:  No specialty comments available.  PMFS History: Patient Active Problem List   Diagnosis Date Noted  . Achilles tendon contracture, right   . Plantar fasciitis   . S/P total knee replacement, left 09/24/17 09/24/2017  . Rectal polyp   . Gastritis due to nonsteroidal anti-inflammatory drug   . Stricture and stenosis of esophagus   . GERD (gastroesophageal reflux disease) 07/28/2017  . Rectal bleeding 07/28/2017  . Dysphagia 07/28/2017  . Abdominal pain, epigastric  07/28/2017  . Trigger finger, acquired   . OA (osteoarthritis) of knee 10/07/2012  . Left leg weakness 03/17/2012  . Difficulty in walking(719.7) 03/17/2012  . CARPAL TUNNEL SYNDROME 05/01/2010  . MITRAL REGURGITATION 07/20/2009  . AORTIC INSUFFICIENCY 07/20/2009  . SHORTNESS OF BREATH 06/20/2009  . CHEST PAIN-UNSPECIFIED 05/31/2009   Past Medical History:  Diagnosis Date  . Angina    occasional  . Anxiety   . Arthritis   . Carpal tunnel syndrome of right wrist   . Chronic pain   . Diabetes ([place])   . Dyspnea   . GERD (gastroesophageal reflux disease)   . Hypercholesteremia   . Hypertension   . Panic attack   . PONV (postoperative nausea and  vomiting)   . Sleep apnea    diagnosed but doesn't use CPAP;     Family History  Problem Relation Age of Onset  . Heart disease Other   . Arthritis Other   . Cancer Other   . Diabetes Other   . Colon cancer Mother 75  . Gastric cancer Neg Hx   . Esophageal cancer Neg Hx     Past Surgical History:  Procedure Laterality Date  . ABDOMINAL HYSTERECTOMY    . BALLOON DILATION N/A 10/09/2020   Procedure: BALLOON DILATION;  Surgeon: Eloise Harman, DO;  [facility];  Service: Endoscopy;  Laterality: N/A;  . BIOPSY  08/25/2017   Procedure: BIOPSY;  Surgeon: Danie Binder, MD;  [facility];  Service: Endoscopy;;  gastric biopsy, gastric polyp  . BIOPSY  10/09/2020   Procedure: BIOPSY;  Surgeon: Eloise Harman, DO;  [facility];  Service: Endoscopy;;  . CESAREAN SECTION     x 3  . CHOLECYSTECTOMY    . COLONOSCOPY WITH PROPOFOL N/A 08/25/2017   TI appeared normal. 5 mm polyp in rectum. Sessile. Redundant colon. Internal hemorrhoids. Hyperplastic.   Marland Kitchen ESOPHAGOGASTRODUODENOSCOPY (EGD) WITH PROPOFOL N/A 08/25/2017   benign-apearing esophageal stenosis, dilation, small hiatal hernia, mild gastritis due to NSAIDs. Negative H.pylori.   . ESOPHAGOGASTRODUODENOSCOPY (EGD) WITH PROPOFOL N/A 10/09/2020   Z-line irregular, benign-appearing esophageal stenosis, small hiatal hernia, gastritis, normal duodenum. +Barrett's esophagus, reactive gastropathy with erosions, negative celiac. Surveillance EGD in 2024.   Marland Kitchen FOOT SURGERY Right    x 2-bone spur  . GASTROCNEMIUS RECESSION Right 12/18/2020   Procedure: RIGHT GASTROCNEMIUS RECSSION AND PLANTAR FASCIA RELEASE;  Surgeon: [name], MD;  Location: [facility];  Service: Orthopedics;  Laterality: Right;  . HAND SURGERY Right    x 2-carpal tunnel  . KNEE ARTHROSCOPY Left   . KNEE ARTHROSCOPY WITH MEDIAL MENISECTOMY Left 05/07/2017   Procedure: KNEE ARTHROSCOPY WITH MEDIAL MENISECTOMY AND LATERAL  MENISECTOMY, limited debriedment;  Surgeon: Carole Civil, MD;  [facility];  Service: Orthopedics;  Laterality: Left;  . PARTIAL HYSTERECTOMY  2001   total abdominal  . POLYPECTOMY  08/25/2017   Procedure: POLYPECTOMY;  Surgeon: Danie Binder, MD;  [facility];  Service: Endoscopy;;  rectal polyp cs  . SAVORY DILATION N/A 08/25/2017   Procedure: SAVORY DILATION;  Surgeon: Danie Binder, MD;  [facility];  Service: Endoscopy;  Laterality: N/A;  . TOTAL KNEE ARTHROPLASTY Left 09/24/2017   Procedure: TOTAL KNEE ARTHROPLASTY;  Surgeon: Carole Civil, MD;  [facility];  Service: Orthopedics;  Laterality: Left;  . TRIGGER FINGER RELEASE Right 11/27/2016   Procedure: RELEASE TRIGGER FINGER/A-1 PULLEY RIGHT LONG FINGER;  Surgeon:  Carole Civil, MD;  Location[facility];  Service: Orthopedics;  Laterality: Right;  pt knows to arrive at 8:45   Social History   Occupational History  . Not on file  Tobacco Use  . Smoking status: Never Smoker  . Smokeless tobacco: Never Used  Vaping Use  . Vaping Use: Never used  Substance and Sexual Activity  . Alcohol use: No  . Drug use: No  . Sexual activity: Not Currently    Birth control/protection: Surgical

## 2021-01-23 ENCOUNTER — Telehealth: Payer: Self-pay | Admitting: Orthopedic Surgery

## 2021-01-23 NOTE — Telephone Encounter (Signed)
This has been done.

## 2021-01-23 NOTE — Telephone Encounter (Signed)
Pt states that she is still having discomfort and does not feel she is able to return to work. Advised ok to remain out of work until post op appt and she will try driving around neighborhood when she feels that she is able to press the brake down full weight with no hesitation. Will call with any other questions.

## 2021-01-23 NOTE — Telephone Encounter (Signed)
Patient called requesting a call back from Autumn F. Patient did not disclose nature of her call. Patient phone number is [phone].

## 2021-02-07 ENCOUNTER — Other Ambulatory Visit: Payer: Self-pay | Admitting: Physician Assistant

## 2021-02-07 ENCOUNTER — Telehealth: Payer: Self-pay | Admitting: Orthopedic Surgery

## 2021-02-07 MED ORDER — HYDROCODONE-ACETAMINOPHEN 5-325 MG PO TABS: 1.0000 | ORAL_TABLET | Freq: Three times a day (TID) | ORAL | 0 refills | Status: DC | PRN

## 2021-02-07 NOTE — Telephone Encounter (Signed)
done

## 2021-02-07 NOTE — Telephone Encounter (Signed)
Patient called needing Rx refilled Hydrocodone. The number to contact patient is [phone]

## 2021-02-14 ENCOUNTER — Ambulatory Visit: Payer: 59 | Admitting: Orthopedic Surgery

## 2021-02-28 ENCOUNTER — Encounter: Payer: Self-pay | Admitting: Orthopedic Surgery

## 2021-02-28 ENCOUNTER — Ambulatory Visit (INDEPENDENT_AMBULATORY_CARE_PROVIDER_SITE_OTHER): Payer: 59

## 2021-02-28 ENCOUNTER — Ambulatory Visit (INDEPENDENT_AMBULATORY_CARE_PROVIDER_SITE_OTHER): Payer: 59 | Admitting: Orthopedic Surgery

## 2021-02-28 ENCOUNTER — Telehealth: Payer: Self-pay | Admitting: Orthopedic Surgery

## 2021-02-28 DIAGNOSIS — G8929 Other chronic pain: Secondary | ICD-10-CM

## 2021-02-28 DIAGNOSIS — M25561 Pain in right knee: Secondary | ICD-10-CM | POA: Diagnosis not present

## 2021-02-28 MED ORDER — HYDROCODONE-ACETAMINOPHEN 5-325 MG PO TABS: 1.0000 | ORAL_TABLET | Freq: Three times a day (TID) | ORAL | 0 refills | Status: DC | PRN

## 2021-02-28 NOTE — Progress Notes (Signed)
Office Visit Note   Patient: Rhonda Patton           Date of Birth: 07/01/1961           MRN: [id number] Visit Date: 02/28/2021              Requested by: Celene Squibb, MD [address] [place],  [place] [address] PCP: Celene Squibb, MD  Chief Complaint  Patient presents with  . Right Leg - Routine Post Op    12/18/20 right gastroc recession PF release   . Right Knee - Pain      HPI: Patient is a 60 year old woman who is 2 months status post right gastrocnemius recession and plantar fascial release.  Patient states she does have some sensitivity to light touch but otherwise feels like this is improving.  Patient complains of pain primarily at this time of the right knee getting worse over the past year she has decreased range of motion and swelling and pain including mechanical symptoms with locking catching and giving way.  She states she has almost fallen and uses a cane for ambulation.  She is 2 years status post a left total knee arthroplasty.  Assessment & Plan: Visit Diagnoses:  1. Chronic pain of right knee     Plan: She will continue with scar massage and range of motion for the right foot and right calf.  We have discussed proceeding with a total knee arthroplasty on the right.  Risks and benefits were discussed patient states she understands and wishes to proceed with a right total knee arthroplasty.  Follow-Up Instructions: Return if symptoms worsen or fail to improve.   Ortho Exam  Patient is alert, oriented, no adenopathy, well-dressed, normal affect, normal respiratory effort. Examination the surgical incision on the right calf and right foot are well-healed there is no cellulitis no swelling no signs of infection she has excellent range of motion of the ankle the plantar fascia is nontender to palpation.  Examination of the right knee she is tender to palpation of the patellofemoral joint as well as medial lateral joint line collaterals are cruciates are  stable there is varus alignment.  There is crepitation with range of motion and range of motion from 0 to 100 degrees.  Imaging: XR Knee 1-2 Views Right  Result Date: 02/28/2021 2 view radiographs of the right knee shows varus alignment with bone-on-bone contact medial joint line with periarticular bony spurs in all 3 compartments with subcondylar cysts and sclerosis.  In comparison she has a stable left total knee arthroplasty.  No images are attached to the encounter.  Labs: Lab Results  Component Value Date   HGBA1C 5.9 (H) 08/20/2017   LABORGA Insignificant Growth 08/24/2014     Lab Results  Component Value Date   ALBUMIN 4.4 09/22/2017   ALBUMIN 3.8 08/24/2014    No results found for: MG No results found for: VD25OH  No results found for: PREALBUMIN CBC EXTENDED Latest Ref Rng & Units 11/14/2020 09/27/2017 09/26/2017  WBC 3.8 - 10.8 Thousand/uL 5.0 9.0 9.7  RBC 3.80 - 5.10 Million/uL 4.89 3.97 4.08  HGB 11.7 - 15.5 g/dL 13.9 11.4(L) 11.6(L)  HCT 35.0 - 45.0 % 41.9 35.0(L) 35.9(L)  PLT 140 - 400 Thousand/uL 207 169 173  NEUTROABS 1,500 - 7,800 cells/uL 2,640 - -  LYMPHSABS 850 - 3,900 cells/uL 1,585 - -     There is no height or weight on file to calculate BMI.  Orders:  Orders Placed This Encounter  Procedures  . XR Knee 1-2 Views Right   Meds ordered this encounter  Medications  . HYDROcodone-acetaminophen (NORCO/VICODIN) 5-325 MG tablet    Sig: Take 1 tablet by mouth every 8 (eight) hours as needed for moderate pain.    Dispense:  30 tablet    Refill:  0     Procedures: No procedures performed  Clinical Data: No additional findings.  ROS:  All other systems negative, except as noted in the HPI. Review of Systems  Objective: Vital Signs: There were no vitals taken for this visit.  Specialty Comments:  No specialty comments available.  PMFS History: Patient Active Problem List   Diagnosis Date Noted  . Achilles tendon contracture, right   .  Plantar fasciitis   . S/P total knee replacement, left 09/24/17 09/24/2017  . Rectal polyp   . Gastritis due to nonsteroidal anti-inflammatory drug   . Stricture and stenosis of esophagus   . GERD (gastroesophageal reflux disease) 07/28/2017  . Rectal bleeding 07/28/2017  . Dysphagia 07/28/2017  . Abdominal pain, epigastric 07/28/2017  . Trigger finger, acquired   . OA (osteoarthritis) of knee 10/07/2012  . Left leg weakness 03/17/2012  . Difficulty in walking(719.7) 03/17/2012  . CARPAL TUNNEL SYNDROME 05/01/2010  . MITRAL REGURGITATION 07/20/2009  . AORTIC INSUFFICIENCY 07/20/2009  . SHORTNESS OF BREATH 06/20/2009  . CHEST PAIN-UNSPECIFIED 05/31/2009   Past Medical History:  Diagnosis Date  . Angina    occasional  . Anxiety   . Arthritis   . Carpal tunnel syndrome of right wrist   . Chronic pain   . Diabetes ([place])   . Dyspnea   . GERD (gastroesophageal reflux disease)   . Hypercholesteremia   . Hypertension   . Panic attack   . PONV (postoperative nausea and vomiting)   . Sleep apnea    diagnosed but doesn't use CPAP;     Family History  Problem Relation Age of Onset  . Heart disease Other   . Arthritis Other   . Cancer Other   . Diabetes Other   . Colon cancer Mother 14  . Gastric cancer Neg Hx   . Esophageal cancer Neg Hx     Past Surgical History:  Procedure Laterality Date  . ABDOMINAL HYSTERECTOMY    . BALLOON DILATION N/A 10/09/2020   Procedure: BALLOON DILATION;  Surgeon: Eloise Harman, DO;  [facility];  Service: Endoscopy;  Laterality: N/A;  . BIOPSY  08/25/2017   Procedure: BIOPSY;  Surgeon: Danie Binder, MD;  [facility];  Service: Endoscopy;;  gastric biopsy, gastric polyp  . BIOPSY  10/09/2020   Procedure: BIOPSY;  Surgeon: Eloise Harman, DO;  [facility];  Service: Endoscopy;;  . CESAREAN SECTION     x 3  . CHOLECYSTECTOMY    . COLONOSCOPY WITH PROPOFOL N/A 08/25/2017   TI appeared normal. 5 mm  polyp in rectum. Sessile. Redundant colon. Internal hemorrhoids. Hyperplastic.   Marland Kitchen ESOPHAGOGASTRODUODENOSCOPY (EGD) WITH PROPOFOL N/A 08/25/2017   benign-apearing esophageal stenosis, dilation, small hiatal hernia, mild gastritis due to NSAIDs. Negative H.pylori.   . ESOPHAGOGASTRODUODENOSCOPY (EGD) WITH PROPOFOL N/A 10/09/2020   Z-line irregular, benign-appearing esophageal stenosis, small hiatal hernia, gastritis, normal duodenum. +Barrett's esophagus, reactive gastropathy with erosions, negative celiac. Surveillance EGD in 2024.   Marland Kitchen FOOT SURGERY Right    x 2-bone spur  . GASTROCNEMIUS RECESSION Right 12/18/2020   Procedure: RIGHT GASTROCNEMIUS RECSSION AND PLANTAR FASCIA RELEASE;  Surgeon: [name], MD;  Location: [facility];  Service: Orthopedics;  Laterality: Right;  . HAND SURGERY Right    x 2-carpal tunnel  . KNEE ARTHROSCOPY Left   . KNEE ARTHROSCOPY WITH MEDIAL MENISECTOMY Left 05/07/2017   Procedure: KNEE ARTHROSCOPY WITH MEDIAL MENISECTOMY AND LATERAL MENISECTOMY, limited debriedment;  Surgeon: Carole Civil, MD;  [facility];  Service: Orthopedics;  Laterality: Left;  . PARTIAL HYSTERECTOMY  2001   total abdominal  . POLYPECTOMY  08/25/2017   Procedure: POLYPECTOMY;  Surgeon: Danie Binder, MD;  [facility];  Service: Endoscopy;;  rectal polyp cs  . SAVORY DILATION N/A 08/25/2017   Procedure: SAVORY DILATION;  Surgeon: Danie Binder, MD;  [facility];  Service: Endoscopy;  Laterality: N/A;  . TOTAL KNEE ARTHROPLASTY Left 09/24/2017   Procedure: TOTAL KNEE ARTHROPLASTY;  Surgeon: Carole Civil, MD;  [facility];  Service: Orthopedics;  Laterality: Left;  . TRIGGER FINGER RELEASE Right 11/27/2016   Procedure: RELEASE TRIGGER FINGER/A-1 PULLEY RIGHT LONG FINGER;  Surgeon: Carole Civil, MD;  [facility];  Service: Orthopedics;  Laterality: Right;  pt knows to arrive at 8:45   Social History   Occupational  History  . Not on file  Tobacco Use  . Smoking status: Never Smoker  . Smokeless tobacco: Never Used  Vaping Use  . Vaping Use: Never used  Substance and Sexual Activity  . Alcohol use: No  . Drug use: No  . Sexual activity: Not Currently    Birth control/protection: Surgical

## 2021-02-28 NOTE — Telephone Encounter (Signed)
Patient called. She would like a refill on her pain medication called in to her pharmacy. Her call back number is [phone]

## 2021-03-22 ENCOUNTER — Telehealth: Payer: Self-pay | Admitting: Orthopedic Surgery

## 2021-03-22 NOTE — Telephone Encounter (Signed)
Pt is to sch a total knee replacement. She was given a refill on 02/28/21 # 30 advised can not refill at this time as the pain medication will not work for her if she takes too long prior to surgery. To call with any questions.

## 2021-03-22 NOTE — Telephone Encounter (Signed)
Refill on hydrocodone

## 2021-03-27 DIAGNOSIS — J0100 Acute maxillary sinusitis, unspecified: Secondary | ICD-10-CM | POA: Insufficient documentation

## 2021-04-02 ENCOUNTER — Telehealth: Payer: Self-pay | Admitting: Orthopedic Surgery

## 2021-04-02 NOTE — Telephone Encounter (Signed)
I called Ms. Buckingham to discuss scheduling total knee replacement.  Left a message on her voicemail to please return my call.

## 2021-04-08 ENCOUNTER — Other Ambulatory Visit (HOSPITAL_COMMUNITY): Payer: Self-pay | Admitting: *Deleted

## 2021-04-08 DIAGNOSIS — G933 Postviral fatigue syndrome: Secondary | ICD-10-CM

## 2021-04-08 DIAGNOSIS — G9331 Postviral fatigue syndrome: Secondary | ICD-10-CM

## 2021-04-09 ENCOUNTER — Other Ambulatory Visit: Payer: Self-pay

## 2021-04-09 ENCOUNTER — Ambulatory Visit (HOSPITAL_COMMUNITY)
Admission: RE | Admit: 2021-04-09 | Discharge: 2021-04-09 | Disposition: A | Discharge status: Home / Self Care | Payer: 59 | Source: Ambulatory Visit | Attending: Student | Admitting: Student

## 2021-04-09 DIAGNOSIS — G933 Postviral fatigue syndrome: Secondary | ICD-10-CM | POA: Insufficient documentation

## 2021-04-09 DIAGNOSIS — G9331 Postviral fatigue syndrome: Secondary | ICD-10-CM

## 2021-04-18 ENCOUNTER — Emergency Department (HOSPITAL_COMMUNITY): Payer: 59

## 2021-04-18 ENCOUNTER — Other Ambulatory Visit: Payer: Self-pay

## 2021-04-18 ENCOUNTER — Encounter (HOSPITAL_COMMUNITY): Payer: Self-pay | Admitting: *Deleted

## 2021-04-18 ENCOUNTER — Emergency Department (HOSPITAL_COMMUNITY)
Admission: EM | Admit: 2021-04-18 | Discharge: 2021-04-18 | Disposition: A | Discharge status: Home / Self Care | Payer: 59 | Attending: Emergency Medicine | Admitting: Emergency Medicine

## 2021-04-18 DIAGNOSIS — I10 Essential (primary) hypertension: Secondary | ICD-10-CM | POA: Diagnosis not present

## 2021-04-18 DIAGNOSIS — R519 Headache, unspecified: Secondary | ICD-10-CM | POA: Diagnosis not present

## 2021-04-18 DIAGNOSIS — R739 Hyperglycemia, unspecified: Secondary | ICD-10-CM

## 2021-04-18 DIAGNOSIS — Z79899 Other long term (current) drug therapy: Secondary | ICD-10-CM | POA: Insufficient documentation

## 2021-04-18 DIAGNOSIS — E1165 Type 2 diabetes mellitus with hyperglycemia: Secondary | ICD-10-CM | POA: Insufficient documentation

## 2021-04-18 DIAGNOSIS — Z96652 Presence of left artificial knee joint: Secondary | ICD-10-CM | POA: Diagnosis not present

## 2021-04-18 DIAGNOSIS — Z7984 Long term (current) use of oral hypoglycemic drugs: Secondary | ICD-10-CM | POA: Diagnosis not present

## 2021-04-18 DIAGNOSIS — R631 Polydipsia: Secondary | ICD-10-CM | POA: Diagnosis present

## 2021-04-18 LAB — CBG MONITORING, ED
Glucose-Capillary: 591 mg/dL (ref 70–99)
Glucose-Capillary: 432 mg/dL — ABNORMAL HIGH (ref 70–99)
Glucose-Capillary: 367 mg/dL — ABNORMAL HIGH (ref 70–99)

## 2021-04-18 LAB — URINALYSIS, ROUTINE W REFLEX MICROSCOPIC
Bilirubin Urine: NEGATIVE
Glucose, UA: >=500 mg/dL — AB
Hgb urine dipstick: NEGATIVE
Ketones, ur: 80 mg/dL — AB
Leukocytes,Ua: NEGATIVE
Nitrite: NEGATIVE
Protein, ur: NEGATIVE mg/dL
Specific Gravity, Urine: 1.032 — ABNORMAL HIGH (ref 1.005–1.030)
pH: 5 (ref 5.0–8.0)

## 2021-04-18 LAB — COMPREHENSIVE METABOLIC PANEL
ALT: 31 U/L (ref 0–44)
AST: 23 U/L (ref 15–41)
Albumin: 4.2 g/dL (ref 3.5–5.0)
Alkaline Phosphatase: 93 U/L (ref 38–126)
Anion gap: 12 (ref 5–15)
BUN: 15 mg/dL (ref 8–23)
CO2: 21 mmol/L — ABNORMAL LOW (ref 22–32)
Calcium: 8.9 mg/dL (ref 8.9–10.3)
Chloride: 99 mmol/L (ref 98–111)
Creatinine, Ser: 1.24 mg/dL — ABNORMAL HIGH (ref 0.44–1.00)
GFR, Estimated: 50 mL/min — ABNORMAL LOW (ref >60)
Glucose, Bld: 572 mg/dL (ref 70–99)
Potassium: 4 mmol/L (ref 3.5–5.1)
Sodium: 132 mmol/L — ABNORMAL LOW (ref 135–145)
Total Bilirubin: 0.8 mg/dL (ref 0.3–1.2)
Total Protein: 7.1 g/dL (ref 6.5–8.1)

## 2021-04-18 LAB — BLOOD GAS, VENOUS
Acid-base deficit: 2.7 mmol/L — ABNORMAL HIGH (ref 0.0–2.0)
Bicarbonate: 22.4 mmol/L (ref 20.0–28.0)
FIO2: 21
O2 Saturation: 90.8 %
Patient temperature: 36.8
pCO2, Ven: 34.6 mmHg — ABNORMAL LOW (ref 44.0–60.0)
pH, Ven: 7.406 (ref 7.250–7.430)
pO2, Ven: 62.6 mmHg — ABNORMAL HIGH (ref 32.0–45.0)

## 2021-04-18 LAB — CBC WITH DIFFERENTIAL/PLATELET
Abs Immature Granulocytes: 0.01 10*3/uL (ref 0.00–0.07)
Basophils Absolute: 0 10*3/uL (ref 0.0–0.1)
Basophils Relative: 0 %
Eosinophils Absolute: 0.2 10*3/uL (ref 0.0–0.5)
Eosinophils Relative: 4 %
HCT: 41.4 % (ref 36.0–46.0)
Hemoglobin: 14.1 g/dL (ref 12.0–15.0)
Immature Granulocytes: 0 %
Lymphocytes Relative: 25 %
Lymphs Abs: 1.2 10*3/uL (ref 0.7–4.0)
MCH: 28.7 pg (ref 26.0–34.0)
MCHC: 34.1 g/dL (ref 30.0–36.0)
MCV: 84.1 fL (ref 80.0–100.0)
Monocytes Absolute: 0.4 10*3/uL (ref 0.1–1.0)
Monocytes Relative: 9 %
Neutro Abs: 3.1 10*3/uL (ref 1.7–7.7)
Neutrophils Relative %: 62 %
Platelets: 203 10*3/uL (ref 150–400)
RBC: 4.92 MIL/uL (ref 3.87–5.11)
RDW: 13.3 % (ref 11.5–15.5)
WBC: 5 10*3/uL (ref 4.0–10.5)
nRBC: 0 % (ref 0.0–0.2)

## 2021-04-18 LAB — TROPONIN I (HIGH SENSITIVITY): Troponin I (High Sensitivity): 4 ng/L (ref <18)

## 2021-04-18 MED ORDER — ONDANSETRON HCL 4 MG/2ML IJ SOLN
4.0000 mg | Freq: Once | INTRAMUSCULAR | Status: AC
  Administered 2021-04-18: 4 mg via INTRAVENOUS
  Filled 2021-04-18: qty 2

## 2021-04-18 MED ORDER — SODIUM CHLORIDE 0.9 % IV BOLUS
500.0000 mL | Freq: Once | INTRAVENOUS | Status: AC
  Administered 2021-04-18: 500 mL via INTRAVENOUS

## 2021-04-18 MED ORDER — SODIUM CHLORIDE 0.9 % IV BOLUS
1000.0000 mL | Freq: Once | INTRAVENOUS | Status: AC
  Administered 2021-04-18: 1000 mL via INTRAVENOUS

## 2021-04-18 MED ORDER — INSULIN ASPART 100 UNIT/ML IJ SOLN
5.0000 [IU] | Freq: Once | INTRAMUSCULAR | Status: AC
  Administered 2021-04-18: 5 [IU] via SUBCUTANEOUS
  Filled 2021-04-18: qty 1

## 2021-04-18 NOTE — Discharge Instructions (Signed)
Lab work and imaging are reassuring, it is noted that you had a an elevated glucose today, I want you to continue with your diabetes medication as prescribed.  I have given some information on how to avoid hyperglycemia please review.  I would like you to follow-up with your PCP as I feel you might need adjustments to your medications.  Please call at your earliest convenience.  Come back to the emergency department if you develop chest pain, shortness of breath, severe abdominal pain, uncontrolled nausea, vomiting, diarrhea.

## 2021-04-18 NOTE — ED Triage Notes (Signed)
States he blood sugar ws over 600 at home

## 2021-04-18 NOTE — ED Provider Notes (Signed)
[facility] EMERGENCY DEPARTMENT Provider Note   CSN: [id number] Arrival date & time: 04/18/21  1533     History Chief Complaint  Patient presents with  . Hyperglycemia    Rhonda Patton is a 60 y.o. female.  HPI  Patient with significant medical history of anxiety, GERD, hypertension, type 2 diabetes presents with chief complaint of elevated glucose.  Patient states for the last 3 days she has noticed that her glucose has been high, she states today it was greater than 600.  She endorses that she has been having severe thirst, increased urinary frequency, generalized weakness, abdominal pain, nausea and a slight headache.  Patient denies recent sick contacts, she denies nasal congestion, sore throat, cough, she is up-to-date on her COVID and her influenza vaccine, states she has been compliant with her medications, she denies recent changes to medications, she states she has never had this happen to her in the past.  She denies alleviating factors.  Patient also endorses that she has some slight shortness of breath, states that she has been short of breath since COVID, states this been her baseline, she has no associated chest pain, and lightheaded, dizziness, does not endorse worsening pedal edema or orthopnea.  Past Medical History:  Diagnosis Date  . Angina    occasional  . Anxiety   . Arthritis   . Carpal tunnel syndrome of right wrist   . Chronic pain   . Diabetes ([place])   . Dyspnea   . GERD (gastroesophageal reflux disease)   . Hypercholesteremia   . Hypertension   . Panic attack   . PONV (postoperative nausea and vomiting)   . Sleep apnea    diagnosed but doesn't use CPAP;     Patient Active Problem List   Diagnosis Date Noted  . Achilles tendon contracture, right   . Plantar fasciitis   . S/P total knee replacement, left 09/24/17 09/24/2017  . Rectal polyp   . Gastritis due to nonsteroidal anti-inflammatory drug   . Stricture and stenosis of esophagus   .  GERD (gastroesophageal reflux disease) 07/28/2017  . Rectal bleeding 07/28/2017  . Dysphagia 07/28/2017  . Abdominal pain, epigastric 07/28/2017  . Trigger finger, acquired   . OA (osteoarthritis) of knee 10/07/2012  . Left leg weakness 03/17/2012  . Difficulty in walking(719.7) 03/17/2012  . CARPAL TUNNEL SYNDROME 05/01/2010  . MITRAL REGURGITATION 07/20/2009  . AORTIC INSUFFICIENCY 07/20/2009  . SHORTNESS OF BREATH 06/20/2009  . CHEST PAIN-UNSPECIFIED 05/31/2009    Past Surgical History:  Procedure Laterality Date  . ABDOMINAL HYSTERECTOMY    . BALLOON DILATION N/A 10/09/2020   Procedure: BALLOON DILATION;  Surgeon: Eloise Harman, DO;  [facility];  Service: Endoscopy;  Laterality: N/A;  . BIOPSY  08/25/2017   Procedure: BIOPSY;  Surgeon: Danie Binder, MD;  [facility];  Service: Endoscopy;;  gastric biopsy, gastric polyp  . BIOPSY  10/09/2020   Procedure: BIOPSY;  Surgeon: Eloise Harman, DO;  Location: [facility];  Service: Endoscopy;;  . CESAREAN SECTION     x 3  . CHOLECYSTECTOMY    . COLONOSCOPY WITH PROPOFOL N/A 08/25/2017   TI appeared normal. 5 mm polyp in rectum. Sessile. Redundant colon. Internal hemorrhoids. Hyperplastic.   Marland Kitchen ESOPHAGOGASTRODUODENOSCOPY (EGD) WITH PROPOFOL N/A 08/25/2017   benign-apearing esophageal stenosis, dilation, small hiatal hernia, mild gastritis due to NSAIDs. Negative H.pylori.   . ESOPHAGOGASTRODUODENOSCOPY (EGD) WITH PROPOFOL N/A 10/09/2020   Z-line irregular, benign-appearing esophageal stenosis,  small hiatal hernia, gastritis, normal duodenum. +Barrett's esophagus, reactive gastropathy with erosions, negative celiac. Surveillance EGD in 2024.   Marland Kitchen FOOT SURGERY Right    x 2-bone spur  . GASTROCNEMIUS RECESSION Right 12/18/2020   Procedure: RIGHT GASTROCNEMIUS RECSSION AND PLANTAR FASCIA RELEASE;  Surgeon: [name], MD;  Location: [facility];  Service: Orthopedics;  Laterality: Right;  .  HAND SURGERY Right    x 2-carpal tunnel  . KNEE ARTHROSCOPY Left   . KNEE ARTHROSCOPY WITH MEDIAL MENISECTOMY Left 05/07/2017   Procedure: KNEE ARTHROSCOPY WITH MEDIAL MENISECTOMY AND LATERAL MENISECTOMY, limited debriedment;  Surgeon: Carole Civil, MD;  Location: [facility];  Service: Orthopedics;  Laterality: Left;  . PARTIAL HYSTERECTOMY  2001   total abdominal  . POLYPECTOMY  08/25/2017   Procedure: POLYPECTOMY;  Surgeon: Danie Binder, MD;  [facility];  Service: Endoscopy;;  rectal polyp cs  . SAVORY DILATION N/A 08/25/2017   Procedure: SAVORY DILATION;  Surgeon: Danie Binder, MD;  [facility];  Service: Endoscopy;  Laterality: N/A;  . TOTAL KNEE ARTHROPLASTY Left 09/24/2017   Procedure: TOTAL KNEE ARTHROPLASTY;  Surgeon: Carole Civil, MD;  Location: [facility];  Service: Orthopedics;  Laterality: Left;  . TRIGGER FINGER RELEASE Right 11/27/2016   Procedure: RELEASE TRIGGER FINGER/A-1 PULLEY RIGHT LONG FINGER;  Surgeon: Carole Civil, MD;  Location: [facility];  Service: Orthopedics;  Laterality: Right;  pt knows to arrive at 8:45     OB History   No obstetric history on file.     Family History  Problem Relation Age of Onset  . Heart disease Other   . Arthritis Other   . Cancer Other   . Diabetes Other   . Colon cancer Mother 76  . Gastric cancer Neg Hx   . Esophageal cancer Neg Hx     Social History   Tobacco Use  . Smoking status: Never  . Smokeless tobacco: Never  Vaping Use  . Vaping Use: Never used  Substance Use Topics  . Alcohol use: No  . Drug use: No    Home Medications Prior to Admission medications   Medication Sig Start Date End Date Taking? Authorizing Provider  famotidine (PEPCID) 40 MG tablet Take 40 mg by mouth at bedtime. 06/10/20   [provider]  gabapentin (NEURONTIN) 300 MG capsule Take 300 mg by mouth 3 (three) times daily.  09/02/17   [provider]  hydrochlorothiazide (MICROZIDE) 12.5 MG  capsule Take 12.5 mg by mouth daily.  08/21/17   [provider]  HYDROcodone-acetaminophen (NORCO/VICODIN) 5-325 MG tablet Take 1 tablet by mouth every 8 (eight) hours as needed for moderate pain. 02/28/21   [name], MD  meloxicam (MOBIC) 7.5 MG tablet TAKE 1 TABLET BY MOUTH EVERY DAY 12/24/20   Carole Civil, MD  metFORMIN (GLUCOPHAGE) 500 MG tablet Take 500 mg by mouth 2 (two) times daily. 06/29/20   [provider]  methocarbamol (ROBAXIN) 750 MG tablet Take 750 mg by mouth every 6 (six) hours as needed for muscle spasms.  09/02/17   [provider]  pantoprazole (PROTONIX) 40 MG tablet TAKE 1 TABLET BY MOUTH EVERY DAY 12/03/20   Mahala Menghini, PA-C    Allergies    Codeine and Aspirin  Review of Systems   Review of Systems  Constitutional:  Positive for fatigue. Negative for chills and fever.  HENT:  Negative for congestion.   Respiratory:  Positive for  shortness of breath.   Cardiovascular:  Negative for chest pain.  Gastrointestinal:  Positive for nausea. Negative for abdominal pain, diarrhea and vomiting.  Genitourinary:  Positive for frequency. Negative for dysuria and enuresis.  Musculoskeletal:  Negative for back pain.  Skin:  Negative for rash.  Neurological:  Positive for headaches. Negative for dizziness.  Hematological:  Does not bruise/bleed easily.   Physical Exam Updated Vital Signs BP 115/66   Pulse 75   Temp 98.2 F (36.8 C) (Oral)   Resp (!) 22   Ht 5\' 3"  (1.6 m)   Wt 115.7 kg   SpO2 100%   BMI 45.17 kg/m   Physical Exam Vitals and nursing note reviewed.  Constitutional:      General: She is not in acute distress.    Appearance: She is not ill-appearing.  HENT:     Head: Normocephalic and atraumatic.     Nose: No congestion.     Mouth/Throat:     Mouth: Mucous membranes are moist.     Pharynx: Oropharynx is clear.  Eyes:     Conjunctiva/sclera: Conjunctivae normal.  Cardiovascular:     Rate and Rhythm: Normal  rate and regular rhythm.     Pulses: Normal pulses.     Heart sounds: No murmur heard.   No friction rub. No gallop.  Pulmonary:     Effort: No respiratory distress.     Breath sounds: No wheezing, rhonchi or rales.  Abdominal:     Palpations: Abdomen is soft.     Tenderness: There is no abdominal tenderness. There is no right CVA tenderness or left CVA tenderness.  Musculoskeletal:     Right lower leg: No edema.     Left lower leg: No edema.  Skin:    General: Skin is warm and dry.  Neurological:     Mental Status: She is alert.  Psychiatric:        Mood and Affect: Mood normal.    ED Results / Procedures / Treatments   Labs (all labs ordered are listed, but only abnormal results are displayed) Labs Reviewed  COMPREHENSIVE METABOLIC PANEL - Abnormal; Notable for the following components:      Result Value   Sodium 132 (*)    CO2 21 (*)    Glucose, Bld 572 (*)    Creatinine, Ser 1.24 (*)    GFR, Estimated 50 (*)    All other components within normal limits  URINALYSIS, ROUTINE W REFLEX MICROSCOPIC - Abnormal; Notable for the following components:   Color, Urine STRAW (*)    Specific Gravity, Urine 1.032 (*)    Glucose, UA >=500 (*)    Ketones, ur 80 (*)    Bacteria, UA RARE (*)    All other components within normal limits  BLOOD GAS, VENOUS - Abnormal; Notable for the following components:   pCO2, Ven 34.6 (*)    pO2, Ven 62.6 (*)    Acid-base deficit 2.7 (*)    All other components within normal limits  CBG MONITORING, ED - Abnormal; Notable for the following components:   Glucose-Capillary 591 (*)    All other components within normal limits  CBG MONITORING, ED - Abnormal; Notable for the following components:   Glucose-Capillary 432 (*)    All other components within normal limits  CBG MONITORING, ED - Abnormal; Notable for the following components:   Glucose-Capillary 367 (*)    All other components within normal limits  CBC WITH DIFFERENTIAL/PLATELET   TROPONIN I (HIGH  SENSITIVITY)    EKG None  Radiology DG Chest Port 1 View  Result Date: 04/18/2021 CLINICAL DATA:  Short of breath EXAM: PORTABLE CHEST 1 VIEW COMPARISON:  04/09/2021, 02/09/2021 FINDINGS: The heart size and mediastinal contours are within normal limits. Both lungs are clear. The visualized skeletal structures are unremarkable. IMPRESSION: No active disease. Electronically Signed   By: Donavan Foil M.D.   On: 04/18/2021 16:38    Procedures Procedures   Medications Ordered in ED Medications  sodium chloride 0.9 % bolus 1,000 mL (0 mLs Intravenous Stopped 04/18/21 1757)  ondansetron (ZOFRAN) injection 4 mg (4 mg Intravenous Given 04/18/21 1623)  sodium chloride 0.9 % bolus 500 mL (500 mLs Intravenous New Bag/Given 04/18/21 1757)  insulin aspart (novoLOG) injection 5 Units (5 Units Subcutaneous Given 04/18/21 1758)    ED Course  I have reviewed the triage vital signs and the nursing notes.  Pertinent labs & imaging results that were available during my care of the patient were reviewed by me and considered in my medical decision making (see chart for details).    MDM Rules/Calculators/A&P                         Initial impression-patient presents with elevated glucose.  She is alert, does not appear in acute stress, vital signs reassuring.  Will obtain basic lab work-up, start patient on fluids, antiemetics, reassess.  Work-up-CBC unremarkable, CMP shows slight hyponatremia of 132, CO2 of 21, glucose 572, creatinine 1.24, first troponin 4, venous blood gas shows decreased CO2 of 34.6, UA shows negative nitrates, leukocytes, hematuria.  Reassessment-patient was assessed, states she still does not feel great, states that nausea has gone away.  Vital signs remained stable.  Repeat CBG shows glucose of 431 after liter fluids.  Will provide patient with additional 500 mL of fluid as well as given her 5 units of insulin.   Patient's glucose was rechecked continues to  downtrend.  Patient has no other complaints this time.  Patient will be discharged.  Rule out-low suspicion for systemic infection as patient is nontoxic-appearing, vital signs reassuring, no leukocytosis seen on CBC.  Low suspicion for UTI, pyelonephritis, kidney stone as UA is negative for nitrates, leukocytes, hematuria, no CVA tenderness present my exam.  Low suspicion for HHS or DKA as there is no anion gap present on CMP, no decrease in pH, no ketones seen on UA.  Low suspicion for pneumonia as lung sounds are clear bilaterally, chest x-ray negative for acute findings.  Low suspicion for ACS patient denies chest pain, EKG without signs of ischemia ,first troponin is 4 will defer second opponent as patient has been chest pain-free for greater than 12 hours.  Plan-  Hyperglycemia-unclear etiology, possible patient has built up tolerance from metformin and possibly need medication adjustments.   will have her follow-up with PCP for reevaluation of her diabetes medication.  Vital signs have remained stable, no indication for hospital admission. Patient given at home care as well strict return precautions.  Patient verbalized that they understood agreed to said plan.  Final Clinical Impression(s) / ED Diagnoses Final diagnoses:  Hyperglycemia    Rx / DC Orders ED Discharge Orders     None        Marcello Fennel, PA-C 04/18/21 Hulan Amato, MD 04/19/21 1036

## 2021-04-19 ENCOUNTER — Telehealth: Payer: Self-pay

## 2021-04-19 NOTE — Telephone Encounter (Signed)
Called pt to advise that we can not give refill of narcotic pain medication as she is waiting to sch total joint surgery and this will not be helpful if she takes prior. Pt would like to know when she will be able to schedule? Can you please call?

## 2021-04-19 NOTE — Telephone Encounter (Signed)
Patient would like a Rx for right knee pain sent to her pharmacy.  CB# [phone].  Please advise.  Thank you

## 2021-05-28 ENCOUNTER — Telehealth: Payer: Self-pay | Admitting: Orthopedic Surgery

## 2021-05-28 NOTE — Telephone Encounter (Signed)
Pt called stating she called about her surgery date. Asking for a cal back from [place] Please call this pt about this matter at [phone].

## 2021-06-03 ENCOUNTER — Other Ambulatory Visit: Payer: Self-pay | Admitting: Physician Assistant

## 2021-06-03 NOTE — Telephone Encounter (Signed)
Please see below and advise.

## 2021-06-04 ENCOUNTER — Other Ambulatory Visit: Payer: Self-pay | Admitting: Physician Assistant

## 2021-06-05 ENCOUNTER — Other Ambulatory Visit: Payer: Self-pay | Admitting: Physician Assistant

## 2021-06-05 ENCOUNTER — Telehealth: Payer: Self-pay

## 2021-06-05 ENCOUNTER — Other Ambulatory Visit: Payer: Self-pay

## 2021-06-05 NOTE — Telephone Encounter (Signed)
Malachy Mood talked to patient last week about [employer] and their less than 35 BMI requirement to approve surgery.  I called patient to further discuss.  She is stating that [place] is telling her this is not true but I assured her this policy provision has caused several patients not to be able to have surgery.  She will follow back up with insurance company.  In the meantime, Bevely Palmer said she would send in some medication for her in a message earlier today.  Please send medication to [facility].

## 2021-06-05 NOTE — Telephone Encounter (Signed)
Pt called and would like a call back regarding her knee replacement surgery.   She said someone called her this am regarding her insurance stating they have the wrong information to proceed with surgery.

## 2021-06-05 NOTE — Telephone Encounter (Signed)
Did you call this patient about her surgery? Please see below. Thanks!

## 2021-06-05 NOTE — Telephone Encounter (Signed)
Left a message for the patient that because we are not sure how long it would be . Cant give her any narcotics. If she wants we can refer her to chronic pain or call in meloxicam

## 2021-06-06 ENCOUNTER — Other Ambulatory Visit: Payer: Self-pay | Admitting: Physician Assistant

## 2021-06-06 DIAGNOSIS — G8929 Other chronic pain: Secondary | ICD-10-CM

## 2021-06-06 MED ORDER — MELOXICAM 7.5 MG PO TABS: 7.5000 mg | ORAL_TABLET | Freq: Every day | ORAL | 5 refills | Status: DC

## 2021-06-06 NOTE — Telephone Encounter (Signed)
Please see message below

## 2021-06-06 NOTE — Telephone Encounter (Signed)
Mobic called in. Not to take this with other antiinflammatories

## 2021-06-06 NOTE — Telephone Encounter (Signed)
I called pt and advised of message below. Will call with questions.

## 2021-06-27 DIAGNOSIS — M25474 Effusion, right foot: Secondary | ICD-10-CM | POA: Insufficient documentation

## 2021-06-27 DIAGNOSIS — I10 Essential (primary) hypertension: Secondary | ICD-10-CM | POA: Insufficient documentation

## 2021-06-27 DIAGNOSIS — E669 Obesity, unspecified: Secondary | ICD-10-CM | POA: Insufficient documentation

## 2021-07-08 ENCOUNTER — Telehealth: Payer: Self-pay | Admitting: Orthopedic Surgery

## 2021-07-08 NOTE — Telephone Encounter (Signed)
Patient called to relay that she 'got hit hard on the left knee' - history of total knee replacement by Dr Aline Brochure in 2018 - please advise if okay to schedule here (?on a Wednesday is soonest) or advise work-up at emergency room?

## 2021-07-08 NOTE — Telephone Encounter (Signed)
Done; patient aware of appointment per Dr's response.

## 2021-07-10 ENCOUNTER — Ambulatory Visit: Payer: 59

## 2021-07-10 ENCOUNTER — Encounter: Payer: Self-pay | Admitting: Orthopedic Surgery

## 2021-07-10 ENCOUNTER — Ambulatory Visit (INDEPENDENT_AMBULATORY_CARE_PROVIDER_SITE_OTHER): Payer: 59 | Admitting: Orthopedic Surgery

## 2021-07-10 ENCOUNTER — Other Ambulatory Visit: Payer: Self-pay

## 2021-07-10 ENCOUNTER — Other Ambulatory Visit: Payer: Self-pay | Admitting: Radiology

## 2021-07-10 VITALS — BP 124/81 | HR 78 | Ht 62.0 in | Wt 245.0 lb

## 2021-07-10 DIAGNOSIS — M25562 Pain in left knee: Secondary | ICD-10-CM | POA: Diagnosis not present

## 2021-07-10 DIAGNOSIS — T148XXA Other injury of unspecified body region, initial encounter: Secondary | ICD-10-CM

## 2021-07-10 MED ORDER — HYDROCODONE-ACETAMINOPHEN 5-325 MG PO TABS: 1.0000 | ORAL_TABLET | Freq: Four times a day (QID) | ORAL | 0 refills | Status: DC | PRN

## 2021-07-10 NOTE — Progress Notes (Signed)
Chief Complaint  Patient presents with   Knee Pain    Left knee pain for couple weeks, hit in knee by a jar of peanut butter, history of left knee replacement 09/24/17   Rhonda Patton had a left total knee back in 2018 she had a dry peanut butter fall on her left knee 2 weeks ago she still having pain on the anterolateral aspect of the left knee  She has been taking Tylenol without good pain relief  Examination reveals that she is ambulatory with a cane she has tenderness over the anterolateral aspect of the left knee she has some hypertrophy of her scar there is no signs of infection of the wound there is no effusion there is no ligamentous instability  We did take an x-ray that shows the prosthesis is stable with no acute changes  Impression bone contusion  Recommend medication for pain use a heating pad as needed should resolve on its own  Encounter Diagnoses  Name Primary?   Acute pain of left knee Yes   Contusion of bone     Meds ordered this encounter  Medications   HYDROcodone-acetaminophen (NORCO/VICODIN) 5-325 MG tablet    Sig: Take 1 tablet by mouth every 6 (six) hours as needed for moderate pain.    Dispense:  30 tablet    Refill:  0     Current Outpatient Medications:    famotidine (PEPCID) 40 MG tablet, Take 40 mg by mouth at bedtime., Disp: , Rfl:    hydrochlorothiazide (MICROZIDE) 12.5 MG capsule, Take 12.5 mg by mouth daily. , Disp: , Rfl: 1   HYDROcodone-acetaminophen (NORCO/VICODIN) 5-325 MG tablet, Take 1 tablet by mouth every 6 (six) hours as needed for moderate pain., Disp: 30 tablet, Rfl: 0   insulin aspart (NOVOLOG) 100 UNIT/ML injection, Inject into the skin 3 (three) times daily before meals. Sliding scale, Disp: , Rfl:    meloxicam (MOBIC) 7.5 MG tablet, Take 1 tablet (7.5 mg total) by mouth daily., Disp: 30 tablet, Rfl: 5   metFORMIN (GLUCOPHAGE) 500 MG tablet, Take 500 mg by mouth 2 (two) times daily., Disp: , Rfl:    methocarbamol (ROBAXIN) 750  MG tablet, Take 750 mg by mouth every 6 (six) hours as needed for muscle spasms. , Disp: , Rfl: 1   pantoprazole (PROTONIX) 40 MG tablet, TAKE 1 TABLET BY MOUTH EVERY DAY, Disp: 30 tablet, Rfl: 11

## 2021-07-10 NOTE — Telephone Encounter (Signed)
Resend for 7 day supply/ insurance will not cover your 8 day supply.

## 2021-07-10 NOTE — Patient Instructions (Signed)
Take medication   Use heat pad as needed

## 2021-07-12 DIAGNOSIS — E785 Hyperlipidemia, unspecified: Secondary | ICD-10-CM | POA: Insufficient documentation

## 2021-07-12 DIAGNOSIS — N189 Chronic kidney disease, unspecified: Secondary | ICD-10-CM | POA: Insufficient documentation

## 2021-07-30 ENCOUNTER — Other Ambulatory Visit: Payer: Self-pay

## 2021-08-01 ENCOUNTER — Other Ambulatory Visit: Payer: Self-pay | Admitting: Orthopedic Surgery

## 2021-08-01 MED ORDER — HYDROCODONE-ACETAMINOPHEN 5-325 MG PO TABS: 1.0000 | ORAL_TABLET | Freq: Three times a day (TID) | ORAL | 0 refills | Status: AC | PRN

## 2021-08-01 NOTE — Progress Notes (Signed)
Meds ordered this encounter  Medications   HYDROcodone-acetaminophen (NORCO/VICODIN) 5-325 MG tablet    Sig: Take 1 tablet by mouth every 8 (eight) hours as needed for up to 5 days for moderate pain.    Dispense:  15 tablet    Refill:  0    

## 2021-09-19 ENCOUNTER — Other Ambulatory Visit: Payer: Self-pay | Admitting: Physician Assistant

## 2021-09-19 ENCOUNTER — Other Ambulatory Visit (HOSPITAL_COMMUNITY): Payer: Self-pay | Admitting: Physician Assistant

## 2021-09-19 DIAGNOSIS — M25561 Pain in right knee: Secondary | ICD-10-CM

## 2021-09-19 DIAGNOSIS — M25562 Pain in left knee: Secondary | ICD-10-CM

## 2021-09-20 ENCOUNTER — Other Ambulatory Visit (HOSPITAL_COMMUNITY): Payer: Self-pay | Admitting: Physician Assistant

## 2021-09-20 DIAGNOSIS — M25562 Pain in left knee: Secondary | ICD-10-CM

## 2021-09-20 DIAGNOSIS — M25561 Pain in right knee: Secondary | ICD-10-CM

## 2021-10-03 ENCOUNTER — Encounter (HOSPITAL_COMMUNITY)
Admission: RE | Admit: 2021-10-03 | Discharge: 2021-10-03 | Disposition: A | Discharge status: Home / Self Care | Payer: 59 | Source: Ambulatory Visit | Attending: Physician Assistant | Admitting: Physician Assistant

## 2021-10-03 ENCOUNTER — Other Ambulatory Visit: Payer: Self-pay

## 2021-10-03 DIAGNOSIS — M25562 Pain in left knee: Secondary | ICD-10-CM | POA: Diagnosis present

## 2021-10-03 DIAGNOSIS — M25561 Pain in right knee: Secondary | ICD-10-CM | POA: Insufficient documentation

## 2021-10-03 MED ORDER — TECHNETIUM TC 99M MEDRONATE IV KIT
20.0000 | PACK | Freq: Once | INTRAVENOUS | Status: AC | PRN
  Administered 2021-10-03: 22 via INTRAVENOUS

## 2021-10-15 DIAGNOSIS — R519 Headache, unspecified: Secondary | ICD-10-CM | POA: Insufficient documentation

## 2021-10-22 DIAGNOSIS — G8929 Other chronic pain: Secondary | ICD-10-CM | POA: Insufficient documentation

## 2022-01-08 DIAGNOSIS — E1165 Type 2 diabetes mellitus with hyperglycemia: Secondary | ICD-10-CM | POA: Diagnosis not present

## 2022-01-08 DIAGNOSIS — I10 Essential (primary) hypertension: Secondary | ICD-10-CM | POA: Diagnosis not present

## 2022-01-14 DIAGNOSIS — I10 Essential (primary) hypertension: Secondary | ICD-10-CM | POA: Diagnosis not present

## 2022-01-14 DIAGNOSIS — E1165 Type 2 diabetes mellitus with hyperglycemia: Secondary | ICD-10-CM | POA: Diagnosis not present

## 2022-01-14 DIAGNOSIS — E785 Hyperlipidemia, unspecified: Secondary | ICD-10-CM | POA: Diagnosis not present

## 2022-01-14 DIAGNOSIS — N189 Chronic kidney disease, unspecified: Secondary | ICD-10-CM | POA: Diagnosis not present

## 2022-01-14 DIAGNOSIS — J309 Allergic rhinitis, unspecified: Secondary | ICD-10-CM | POA: Insufficient documentation

## 2022-01-24 ENCOUNTER — Other Ambulatory Visit: Payer: Self-pay | Admitting: Gastroenterology

## 2022-02-13 NOTE — H&P (Signed)
TOTAL KNEE REVISION ADMISSION H&P ? ?Subjective: ? ? ?HPI: Rhonda Patton, 61 y.o. female presents for pre-operative visit in preparation for their left knee poly versus total knee arthroplasty revision, which is scheduled on 03/05/2022 with Dr. Wynelle Link at [facility]. The patient has had symptoms in the left knee including pain which has impacted their quality of life and ability to do activities of daily living. The patient currently has a diagnosis of failed left total knee arthroplasty and has failed conservative treatments including activity modification and physical therapy. The patient has had previous total knee arthroplasty on the left knee. The patient denies an active infection. ? ?Radiographs - AP and lateral of the left knee dated 08/2021 demonstrate the prosthesis on the left is in good position with no periprosthetic abnormalities. She has a Chief Technology Officer total knee. Her right knee shows severe arthritis, bone-on-bone, medial and patellofemoral. ?  ? She had a bone scan done, which is reviewed and she does not have evidence of any loosening.  Her sedimentation rate and C-reactive protein are normal. Her aspiration showed no white cells, no organisms, and negative culture. ? ?Patient Active Problem List  ? Diagnosis Date Noted  ? Achilles tendon contracture, right   ? Plantar fasciitis   ? S/P total knee replacement, left 09/24/17 09/24/2017  ? Rectal polyp   ? Gastritis due to nonsteroidal anti-inflammatory drug   ? Stricture and stenosis of esophagus   ? GERD (gastroesophageal reflux disease) 07/28/2017  ? Rectal bleeding 07/28/2017  ? Dysphagia 07/28/2017  ? Abdominal pain, epigastric 07/28/2017  ? Trigger finger, acquired   ? OA (osteoarthritis) of knee 10/07/2012  ? Left leg weakness 03/17/2012  ? Difficulty in walking(719.7) 03/17/2012  ? CARPAL TUNNEL SYNDROME 05/01/2010  ? MITRAL REGURGITATION 07/20/2009  ? AORTIC INSUFFICIENCY 07/20/2009  ? SHORTNESS OF BREATH 06/20/2009   ? CHEST PAIN-UNSPECIFIED 05/31/2009  ? ? ?Past Medical History:  ?Diagnosis Date  ? Angina   ? occasional  ? Anxiety   ? Arthritis   ? Carpal tunnel syndrome of right wrist   ? Chronic pain   ? Diabetes ([place])   ? Dyspnea   ? GERD (gastroesophageal reflux disease)   ? Hypercholesteremia   ? Hypertension   ? Panic attack   ? PONV (postoperative nausea and vomiting)   ? Sleep apnea   ? diagnosed but doesn't use CPAP;   ? ? ?Past Surgical History:  ?Procedure Laterality Date  ? ABDOMINAL HYSTERECTOMY    ? BALLOON DILATION N/A 10/09/2020  ? Procedure: BALLOON DILATION;  Surgeon: Eloise Harman, DO;  [facility];  Service: Endoscopy;  Laterality: N/A;  ? BIOPSY  08/25/2017  ? Procedure: BIOPSY;  Surgeon: Danie Binder, MD;  [facility];  Service: Endoscopy;;  gastric biopsy, gastric polyp  ? BIOPSY  10/09/2020  ? Procedure: BIOPSY;  Surgeon: Eloise Harman, DO;  [facility];  Service: Endoscopy;;  ? CESAREAN SECTION    ? x 3  ? CHOLECYSTECTOMY    ? COLONOSCOPY WITH PROPOFOL N/A 08/25/2017  ? TI appeared normal. 5 mm polyp in rectum. Sessile. Redundant colon. Internal hemorrhoids. Hyperplastic.   ? ESOPHAGOGASTRODUODENOSCOPY (EGD) WITH PROPOFOL N/A 08/25/2017  ? benign-apearing esophageal stenosis, dilation, small hiatal hernia, mild gastritis due to NSAIDs. Negative H.pylori.   ? ESOPHAGOGASTRODUODENOSCOPY (EGD) WITH PROPOFOL N/A 10/09/2020  ? Z-line irregular, benign-appearing esophageal stenosis, small hiatal hernia, gastritis, normal duodenum. +Barrett's esophagus, reactive gastropathy with erosions, negative celiac.  Surveillance EGD in 2024.   ? FOOT SURGERY Right   ? x 2-bone spur  ? GASTROCNEMIUS RECESSION Right 12/18/2020  ? Procedure: RIGHT GASTROCNEMIUS RECSSION AND PLANTAR FASCIA RELEASE;  Surgeon: [name], MD;  Location: [facility];  Service: Orthopedics;  Laterality: Right;  ? HAND SURGERY Right   ? x 2-carpal tunnel  ? KNEE ARTHROSCOPY Left   ?  KNEE ARTHROSCOPY WITH MEDIAL MENISECTOMY Left 05/07/2017  ? Procedure: KNEE ARTHROSCOPY WITH MEDIAL MENISECTOMY AND LATERAL MENISECTOMY, limited debriedment;  Surgeon: Carole Civil, MD;  Location[facility];  Service: Orthopedics;  Laterality: Left;  ? PARTIAL HYSTERECTOMY  2001  ? total abdominal  ? POLYPECTOMY  08/25/2017  ? Procedure: POLYPECTOMY;  Surgeon: Danie Binder, MD;  [facility];  Service: Endoscopy;;  rectal polyp cs  ? SAVORY DILATION N/A 08/25/2017  ? Procedure: SAVORY DILATION;  Surgeon: Danie Binder, MD;  [facility];  Service: Endoscopy;  Laterality: N/A;  ? TOTAL KNEE ARTHROPLASTY Left 09/24/2017  ? Procedure: TOTAL KNEE ARTHROPLASTY;  Surgeon: Carole Civil, MD;  Location[facility];  Service: Orthopedics;  Laterality: Left;  ? TRIGGER FINGER RELEASE Right 11/27/2016  ? Procedure: RELEASE TRIGGER FINGER/A-1 PULLEY RIGHT LONG FINGER;  Surgeon: Carole Civil, MD;  Location: [facility];  Service: Orthopedics;  Laterality: Right;  pt knows to arrive at 8:45  ? ? ?Prior to Admission medications   ?Medication Sig Start Date End Date Taking? Authorizing Provider  ?famotidine (PEPCID) 40 MG tablet Take 40 mg by mouth at bedtime. 06/10/20   [provider]  ?hydrochlorothiazide (MICROZIDE) 12.5 MG capsule Take 12.5 mg by mouth daily.  08/21/17   [provider]  ?insulin aspart (NOVOLOG) 100 UNIT/ML injection Inject into the skin 3 (three) times daily before meals. Sliding scale    [provider]  ?meloxicam (MOBIC) 7.5 MG tablet Take 1 tablet (7.5 mg total) by mouth daily. 06/06/21   Persons, Bevely Palmer, PA  ?metFORMIN (GLUCOPHAGE) 500 MG tablet Take 500 mg by mouth 2 (two) times daily. 06/29/20   [provider]  ?methocarbamol (ROBAXIN) 750 MG tablet Take 750 mg by mouth every 6 (six) hours as needed for muscle spasms.  09/02/17   [provider]  ?pantoprazole (PROTONIX) 40 MG tablet TAKE 1 TABLET BY MOUTH EVERY DAY 01/24/22    Sherron Monday, NP  ? ? ?Allergies  ?Allergen Reactions  ? Codeine Shortness Of Breath and Nausea And Vomiting  ? Aspirin Nausea Only  ? ? ?Social History  ? ?Socioeconomic History  ? Marital status: Divorced  ?  Spouse name: Not on file  ? Number of children: Not on file  ? Years of education: 10  ? Highest education level: Not on file  ?Occupational History  ? Not on file  ?Tobacco Use  ? Smoking status: Never  ? Smokeless tobacco: Never  ?Vaping Use  ? Vaping Use: Never used  ?Substance and Sexual Activity  ? Alcohol use: No  ? Drug use: No  ? Sexual activity: Not Currently  ?  Birth control/protection: Surgical  ?Other Topics Concern  ? Not on file  ?Social History Narrative  ? Not on file  ? ?Social Determinants of Health  ? ?Financial Resource Strain: Not on file  ?Food Insecurity: Not on file  ?Transportation Needs: Not on file  ?Physical Activity: Not on file  ?Stress: Not on file  ?Social Connections: Not on file  ?Intimate Partner Violence:  Not on file  ? ? ?Tobacco Use: Low Risk   ? Smoking Tobacco Use: Never  ? Smokeless Tobacco Use: Never  ? Passive Exposure: Not on file  ? ?Social History  ? ?Substance and Sexual Activity  ?Alcohol Use No  ? ? ?Family History  ?Problem Relation Age of Onset  ? Heart disease Other   ? Arthritis Other   ? Cancer Other   ? Diabetes Other   ? Colon cancer Mother 37  ? Gastric cancer Neg Hx   ? Esophageal cancer Neg Hx   ? ? ?Review of Systems  ?Constitutional:  Negative for chills and fever.  ?HENT:  Negative for congestion, sore throat and tinnitus.   ?Eyes:  Negative for double vision, photophobia and pain.  ?Respiratory:  Negative for cough, shortness of breath and wheezing.   ?Cardiovascular:  Negative for chest pain, palpitations and orthopnea.  ?Gastrointestinal:  Negative for heartburn, nausea and vomiting.  ?Genitourinary:  Negative for dysuria, frequency and urgency.  ?Musculoskeletal:  Positive for joint pain.  ?Neurological:  Negative for dizziness, weakness  and headaches.  ? ?Objective: ? ?Physical Exam: ?Well nourished and well developed.  ?General: Alert and oriented x3, cooperative and pleasant, no acute distress.  ?Head: normocephalic, atraumatic, neck supp

## 2022-02-17 ENCOUNTER — Telehealth: Payer: Self-pay | Admitting: Internal Medicine

## 2022-02-17 ENCOUNTER — Telehealth: Payer: Self-pay | Admitting: Gastroenterology

## 2022-02-17 NOTE — Telephone Encounter (Signed)
Received denial letter of pantoprazole from [employer]. Patient has barrett's. If she can not afford the medication refilled for her have her let us know and I can send in omeprazole '20mg'$  day as one of the preferred alternatives.  ? ?Venetia Night, MSN, FNP-BC, AGACNP-BC ?[facility] ? ?

## 2022-02-17 NOTE — Telephone Encounter (Signed)
Pt is going to find out how much it is at the pharmacy and let us know if she cant afford it.  ?

## 2022-02-17 NOTE — Telephone Encounter (Signed)
See other note

## 2022-02-17 NOTE — Telephone Encounter (Signed)
Pt returning call. [phone] ?

## 2022-02-17 NOTE — Telephone Encounter (Signed)
Lmom for pt to return my call.  

## 2022-02-20 ENCOUNTER — Inpatient Hospital Stay (HOSPITAL_COMMUNITY): Admission: RE | Admit: 2022-02-20 | Payer: Self-pay | Source: Ambulatory Visit

## 2022-02-25 NOTE — Progress Notes (Addendum)
Anesthesia Review: ? ?PCP: DR Wende Neighbors  ?Cardiologist : none  ?Chest x-ray : 04/10/21  and 1v- 04/18/21  ?EKG : 04/19/21  ?Echo : ?Stress test: ?Cardiac Cath :  ?Activity level: can do a flight of stairs without difficulty  ?Sleep Study/ CPAP : has sleep apnea no cpap  ?Fasting Blood Sugar :      / Checks Blood Sugar -- times a day:   ?Blood Thinner/ Instructions /Last Dose: ?ASA / Instructions/ Last Dose :  ?DM- type 2  ?Hgba1c-   02/27/22-  ?Checks glucose 3 times daily  ?

## 2022-02-25 NOTE — Progress Notes (Signed)
DUE TO COVID-19 ONLY ONE VISITOR IS ALLOWED TO COME WITH YOU AND STAY IN THE WAITING ROOM ONLY DURING PRE OP AND PROCEDURE DAY OF SURGERY.  2 VISITOR  MAY VISIT WITH YOU AFTER SURGERY IN YOUR PRIVATE ROOM DURING VISITING HOURS ONLY! ?YOU MAY HAVE ONE PERSON SPEND THE NITE WITH YOU IN YOUR ROOM AFTER SURGERY.   ? ? Your procedure is scheduled on:  ?          03/05/2022  ? Report to [facility] Main  Entrance ? ? Report to admitting at      1200 noon          DO     NOT [place], PICTURE ID OR WALLET DAY OF SURGERY.  ?  ? ? Call this number if you have problems the morning of surgery [phone]  ? ? REMEMBER: NO  SOLID FOODS , CANDY, GUM OR MINTS AFTER MIDNITE THE NITE BEFORE SURGERY .       Marland Kitchen CLEAR LIQUIDS UNTIL               DAY OF SURGERY.      PLEASE [id number] ISH ENSURE DRINK PER SURGEON ORDER  WHICH NEEDS TO BE COMPLETED AT      1145am  MORNING OF SURGERY.   ? ? ? ? ?CLEAR LIQUID DIET ? ? ?Foods Allowed      ?WATER ?BLACK COFFEE ( SUGAR OK, NO MILK, CREAM OR CREAMER) REGULAR AND DECAF  ?TEA ( SUGAR OK NO MILK, CREAM, OR CREAMER) REGULAR AND DECAF  ?PLAIN JELLO ( NO RED)  ?FRUIT ICES ( NO RED, NO FRUIT PULP)  ?POPSICLES ( NO RED)  ?JUICE- APPLE, WHITE GRAPE AND WHITE CRANBERRY  ?SPORT DRINK LIKE GATORADE ( NO RED)  ?CLEAR BROTH ( VEGETABLE , CHICKEN OR BEEF)                                                               ? ?    ? ?BRUSH YOUR TEETH MORNING OF SURGERY AND RINSE YOUR MOUTH OUT, NO CHEWING GUM CANDY OR MINTS. ?  ? ? Take these medicines the morning of surgery with A SIP OF WATER:  protonix  ? ? ? ?DO NOT TAKE ANY DIABETIC MEDICATIONS DAY OF YOUR SURGERY ?                  ?            You may not have any metal on your body including hair pins and  ?            piercings  Do not wear jewelry, make-up, lotions, powders or perfumes, deodorant ?            Do not wear nail polish on your fingernails.   ?           IF YOU ARE A FEMALE AND WANT TO SHAVE UNDER ARMS OR LEGS PRIOR TO  SURGERY YOU MUST DO SO AT LEAST 48 HOURS PRIOR TO SURGERY.  ?            Men may shave face and neck. ? ? Do not bring valuables to the hospital. [facility] NOT ?  RESPONSIBLE   FOR VALUABLES. ? Contacts, dentures or bridgework may not be worn into surgery. ? Leave suitcase in the car. After surgery it may be brought to your room. ? ?  ? Patients discharged the day of surgery will not be allowed to drive home. IF YOU ARE HAVING SURGERY AND GOING HOME THE SAME DAY, YOU MUST HAVE AN ADULT TO DRIVE YOU HOME AND BE WITH YOU FOR 24 HOURS. YOU MAY GO HOME BY TAXI OR UBER OR ORTHERWISE, BUT AN ADULT MUST ACCOMPANY YOU HOME AND STAY WITH YOU FOR 24 HOURS. ?  ? ?            Please read over the following fact sheets you were given: ?_____________________________________________________________________ ? ? - Preparing for Surgery ?Before surgery, you can play an important role.  Because skin is not sterile, your skin needs to be as free of germs as possible.  You can reduce the number of germs on your skin by washing with CHG (chlorahexidine gluconate) soap before surgery.  CHG is an antiseptic cleaner which kills germs and bonds with the skin to continue killing germs even after washing. ?Please DO NOT use if you have an allergy to CHG or antibacterial soaps.  If your skin becomes reddened/irritated stop using the CHG and inform your nurse when you arrive at Short Stay. ?Do not shave (including legs and underarms) for at least 48 hours prior to the first CHG shower.  You may shave your face/neck. ?Please follow these instructions carefully: ? 1.  Shower with CHG Soap the night before surgery and the  morning of Surgery. ? 2.  If you choose to wash your hair, wash your hair first as usual with your  normal  shampoo. ? 3.  After you shampoo, rinse your hair and body thoroughly to remove the  shampoo.                           4.  Use CHG as you would any other liquid soap.  You can apply chg directly   to the skin and wash  ?                     Gently with a scrungie or clean washcloth. ? 5.  Apply the CHG Soap to your body ONLY FROM THE NECK DOWN.   Do not use on face/ open      ?                     Wound or open sores. Avoid contact with eyes, ears mouth and genitals (private parts).  ?                     Production manager,  Genitals (private parts) with your normal soap. ?            6.  Wash thoroughly, paying special attention to the area where your surgery  will be performed. ? 7.  Thoroughly rinse your body with warm water from the neck down. ? 8.  DO NOT shower/wash with your normal soap after using and rinsing off  the CHG Soap. ?               9.  Pat yourself dry with a clean towel. ?           10.  Wear clean pajamas. ?  11.  Place clean sheets on your bed the night of your first shower and do not  sleep with pets. ?Day of Surgery : ?Do not apply any lotions/deodorants the morning of surgery.  Please wear clean clothes to the hospital/surgery center. ? ?FAILURE TO FOLLOW THESE INSTRUCTIONS MAY RESULT IN THE CANCELLATION OF YOUR SURGERY ?PATIENT SIGNATURE_________________________________ ? ?NURSE SIGNATURE__________________________________ ? ?________________________________________________________________________  ? ? ?           ?

## 2022-02-26 NOTE — Progress Notes (Signed)
DUE TO COVID-19 ONLY  2  VISITOR IS ALLOWED TO COME WITH YOU AND STAY IN THE WAITING ROOM ONLY DURING PRE OP AND PROCEDURE DAY OF SURGERY.   4  VISITOR  MAY VISIT WITH YOU AFTER SURGERY IN YOUR PRIVATE ROOM DURING VISITING HOURS ONLY! ?YOU MAY HAVE ONE PERSON SPEND THE NITE WITH YOU IN YOUR ROOM AFTER SURGERY.   ? ? Your procedure is scheduled on:  ?  03/05/2022  ? Report to [facility] Main  Entrance ? ? Report to admitting at           1200 noon         ?DO NOT BRING INSURANCE CARD, PICTURE ID OR WALLET DAY OF SURGERY.  ?  ? ? Call this number if you have problems the morning of surgery [phone]  ? ? REMEMBER: NO  SOLID FOODS , CANDY, GUM OR MINTS AFTER MIDNITE THE NITE BEFORE SURGERY .       Marland Kitchen CLEAR LIQUIDS UNTIL   1145am              DAY OF SURGERY.      PLEASE FINISH ENSURE DRINK PER SURGEON ORDER  WHICH NEEDS TO BE COMPLETED AT    1145am      MORNING OF SURGERY.   ? ? ? ? ?CLEAR LIQUID DIET ? ? ?Foods Allowed      ?WATER ?BLACK COFFEE ( SUGAR OK, NO MILK, CREAM OR CREAMER) REGULAR AND DECAF  ?TEA ( SUGAR OK NO MILK, CREAM, OR CREAMER) REGULAR AND DECAF  ?PLAIN JELLO ( NO RED)  ?FRUIT ICES ( NO RED, NO FRUIT PULP)  ?POPSICLES ( NO RED)  ?JUICE- APPLE, WHITE GRAPE AND WHITE CRANBERRY  ?SPORT DRINK LIKE GATORADE ( NO RED)  ?CLEAR BROTH ( VEGETABLE , CHICKEN OR BEEF)                                                               ? ?    ? ?BRUSH YOUR TEETH MORNING OF SURGERY AND RINSE YOUR MOUTH OUT, NO CHEWING GUM CANDY OR MINTS. ?  ? ? Take these medicines the morning of surgery with A SIP OF WATER: protonix  ? ? ? ?DO NOT TAKE ANY DIABETIC MEDICATIONS DAY OF YOUR SURGERY ?                  ?            You may not have any metal on your body including hair pins and  ?            piercings  Do not wear jewelry, make-up, lotions, powders or perfumes, deodorant ?            Do not wear nail polish on your fingernails.   ?           IF YOU ARE A FEMALE AND WANT TO SHAVE UNDER ARMS OR LEGS PRIOR TO SURGERY  YOU MUST DO SO AT LEAST 48 HOURS PRIOR TO SURGERY.  ?            Men may shave face and neck. ? ? Do not bring valuables to the hospital. [facility] NOT ?  RESPONSIBLE   FOR VALUABLES. ? Contacts, dentures or bridgework may not be worn into surgery. ? Leave suitcase in the car. After surgery it may be brought to your room. ? ?  ? Patients discharged the day of surgery will not be allowed to drive home. IF YOU ARE HAVING SURGERY AND GOING HOME THE SAME DAY, YOU MUST HAVE AN ADULT TO DRIVE YOU HOME AND BE WITH YOU FOR 24 HOURS. YOU MAY GO HOME BY TAXI OR UBER OR ORTHERWISE, BUT AN ADULT MUST ACCOMPANY YOU HOME AND STAY WITH YOU FOR 24 HOURS. ?  ? ?            Please read over the following fact sheets you were given: ?_____________________________________________________________________ ? ?[place] - Preparing for Surgery ?Before surgery, you can play an important role.  Because skin is not sterile, your skin needs to be as free of germs as possible.  You can reduce the number of germs on your skin by washing with CHG (chlorahexidine gluconate) soap before surgery.  CHG is an antiseptic cleaner which kills germs and bonds with the skin to continue killing germs even after washing. ?Please DO NOT use if you have an allergy to CHG or antibacterial soaps.  If your skin becomes reddened/irritated stop using the CHG and inform your nurse when you arrive at Short Stay. ?Do not shave (including legs and underarms) for at least 48 hours prior to the first CHG shower.  You may shave your face/neck. ?Please follow these instructions carefully: ? 1.  Shower with CHG Soap the night before surgery and the  morning of Surgery. ? 2.  If you choose to wash your hair, wash your hair first as usual with your  normal  shampoo. ? 3.  After you shampoo, rinse your hair and body thoroughly to remove the  shampoo.                           4.  Use CHG as you would any other liquid soap.  You can apply chg directly  to the  skin and wash  ?                     Gently with a scrungie or clean washcloth. ? 5.  Apply the CHG Soap to your body ONLY FROM THE NECK DOWN.   Do not use on face/ open      ?                     Wound or open sores. Avoid contact with eyes, ears mouth and genitals (private parts).  ?                     Production manager,  Genitals (private parts) with your normal soap. ?            6.  Wash thoroughly, paying special attention to the area where your surgery  will be performed. ? 7.  Thoroughly rinse your body with warm water from the neck down. ? 8.  DO NOT shower/wash with your normal soap after using and rinsing off  the CHG Soap. ?               9.  Pat yourself dry with a clean towel. ?           10.  Wear clean pajamas. ?  11.  Place clean sheets on your bed the night of your first shower and do not  sleep with pets. ?Day of Surgery : ?Do not apply any lotions/deodorants the morning of surgery.  Please wear clean clothes to the hospital/surgery center. ? ?FAILURE TO FOLLOW THESE INSTRUCTIONS MAY RESULT IN THE CANCELLATION OF YOUR SURGERY ?PATIENT SIGNATURE_________________________________ ? ?NURSE SIGNATURE__________________________________ ? ?________________________________________________________________________  ? ? ?           ?

## 2022-02-26 NOTE — Progress Notes (Signed)
DUE TO COVID-19 ONLY  2  VISITOR IS ALLOWED TO COME WITH YOU AND STAY IN THE WAITING ROOM ONLY DURING PRE OP AND PROCEDURE DAY OF SURGERY.   4  VISITOR  MAY VISIT WITH YOU AFTER SURGERY IN YOUR PRIVATE ROOM DURING VISITING HOURS ONLY! ?YOU MAY HAVE ONE PERSON SPEND THE NITE WITH YOU IN YOUR ROOM AFTER SURGERY.   ? ? Your procedure is scheduled on:  ?      03/05/2022  ? Report to [facility] Main  Entrance ? ? Report to admitting at       1200 noon           ?DO NOT BRING INSURANCE CARD, PICTURE ID OR WALLET DAY OF SURGERY.  ?  ? ? Call this number if you have problems the morning of surgery [phone]  ? ? REMEMBER: NO  SOLID FOODS , CANDY, GUM OR MINTS AFTER MIDNITE THE NITE BEFORE SURGERY .       Marland Kitchen CLEAR LIQUIDS UNTIL   1145am             DAY OF SURGERY.      PLEASE FINISH ENSURE DRINK PER SURGEON ORDER  WHICH NEEDS TO BE COMPLETED AT   1145am       MORNING OF SURGERY.   ? ? ? ? ?CLEAR LIQUID DIET ? ? ?Foods Allowed      ?WATER ?BLACK COFFEE ( SUGAR OK, NO MILK, CREAM OR CREAMER) REGULAR AND DECAF  ?TEA ( SUGAR OK NO MILK, CREAM, OR CREAMER) REGULAR AND DECAF  ?PLAIN JELLO ( NO RED)  ?FRUIT ICES ( NO RED, NO FRUIT PULP)  ?POPSICLES ( NO RED)  ?JUICE- APPLE, WHITE GRAPE AND WHITE CRANBERRY  ?SPORT DRINK LIKE GATORADE ( NO RED)  ?CLEAR BROTH ( VEGETABLE , CHICKEN OR BEEF)                                                               ? ?    ? ?BRUSH YOUR TEETH MORNING OF SURGERY AND RINSE YOUR MOUTH OUT, NO CHEWING GUM CANDY OR MINTS. ?  ? ? Take these medicines the morning of surgery with A SIP OF WATER: protonix  ? ? ? ?DO NOT TAKE ANY DIABETIC MEDICATIONS DAY OF YOUR SURGERY ?                  ?            You may not have any metal on your body including hair pins and  ?            piercings  Do not wear jewelry, make-up, lotions, powders or perfumes, deodorant ?            Do not wear nail polish on your fingernails.   ?           IF YOU ARE A FEMALE AND WANT TO SHAVE UNDER ARMS OR LEGS PRIOR TO  SURGERY YOU MUST DO SO AT LEAST 48 HOURS PRIOR TO SURGERY.  ?            Men may shave face and neck. ? ? Do not bring valuables to the hospital. [facility] NOT ?  RESPONSIBLE   FOR VALUABLES. ? Contacts, dentures or bridgework may not be worn into surgery. ? Leave suitcase in the car. After surgery it may be brought to your room. ? ?  ? Patients discharged the day of surgery will not be allowed to drive home. IF YOU ARE HAVING SURGERY AND GOING HOME THE SAME DAY, YOU MUST HAVE AN ADULT TO DRIVE YOU HOME AND BE WITH YOU FOR 24 HOURS. YOU MAY GO HOME BY TAXI OR UBER OR ORTHERWISE, BUT AN ADULT MUST ACCOMPANY YOU HOME AND STAY WITH YOU FOR 24 HOURS. ?  ? ?            Please read over the following fact sheets you were given: ?_____________________________________________________________________ ? ?[place] - Preparing for Surgery ?Before surgery, you can play an important role.  Because skin is not sterile, your skin needs to be as free of germs as possible.  You can reduce the number of germs on your skin by washing with CHG (chlorahexidine gluconate) soap before surgery.  CHG is an antiseptic cleaner which kills germs and bonds with the skin to continue killing germs even after washing. ?Please DO NOT use if you have an allergy to CHG or antibacterial soaps.  If your skin becomes reddened/irritated stop using the CHG and inform your nurse when you arrive at Short Stay. ?Do not shave (including legs and underarms) for at least 48 hours prior to the first CHG shower.  You may shave your face/neck. ?Please follow these instructions carefully: ? 1.  Shower with CHG Soap the night before surgery and the  morning of Surgery. ? 2.  If you choose to wash your hair, wash your hair first as usual with your  normal  shampoo. ? 3.  After you shampoo, rinse your hair and body thoroughly to remove the  shampoo.                           4.  Use CHG as you would any other liquid soap.  You can apply chg directly   to the skin and wash  ?                     Gently with a scrungie or clean washcloth. ? 5.  Apply the CHG Soap to your body ONLY FROM THE NECK DOWN.   Do not use on face/ open      ?                     Wound or open sores. Avoid contact with eyes, ears mouth and genitals (private parts).  ?                     Production manager,  Genitals (private parts) with your normal soap. ?            6.  Wash thoroughly, paying special attention to the area where your surgery  will be performed. ? 7.  Thoroughly rinse your body with warm water from the neck down. ? 8.  DO NOT shower/wash with your normal soap after using and rinsing off  the CHG Soap. ?               9.  Pat yourself dry with a clean towel. ?           10.  Wear clean pajamas. ?  11.  Place clean sheets on your bed the night of your first shower and do not  sleep with pets. ?Day of Surgery : ?Do not apply any lotions/deodorants the morning of surgery.  Please wear clean clothes to the hospital/surgery center. ? ?FAILURE TO FOLLOW THESE INSTRUCTIONS MAY RESULT IN THE CANCELLATION OF YOUR SURGERY ?PATIENT SIGNATURE_________________________________ ? ?NURSE SIGNATURE__________________________________ ? ?________________________________________________________________________  ? ? ?           ?

## 2022-02-26 NOTE — Progress Notes (Signed)
DUE TO COVID-19 ONLY  2 VISITOR IS ALLOWED TO COME WITH YOU AND STAY IN THE WAITING ROOM ONLY DURING PRE OP AND PROCEDURE DAY OF SURGERY.   4 VISITOR  MAY VISIT WITH YOU AFTER SURGERY IN YOUR PRIVATE ROOM DURING VISITING HOURS ONLY! ?YOU MAY HAVE ONE PERSON SPEND THE NITE WITH YOU IN YOUR ROOM AFTER SURGERY.   ? ? Your procedure is scheduled on:  ?         03/05/2022  ? Report to [facility] Main  Entrance ? ? Report to admitting at     1200 noon              ?DO NOT BRING INSURANCE CARD, PICTURE ID OR WALLET DAY OF SURGERY.  ?  ? ? Call this number if you have problems the morning of surgery [phone]  ? ? REMEMBER: NO  SOLID FOODS , CANDY, GUM OR MINTS AFTER MIDNITE THE NITE BEFORE SURGERY .       Marland Kitchen CLEAR LIQUIDS UNTIL     1145 am            DAY OF SURGERY.      PLEASE FINISH ENSURE DRINK PER SURGEON ORDER  WHICH NEEDS TO BE COMPLETED AT  1145am        MORNING OF SURGERY.   ? ? ? ? ?CLEAR LIQUID DIET ? ? ?Foods Allowed      ?WATER ?BLACK COFFEE ( SUGAR OK, NO MILK, CREAM OR CREAMER) REGULAR AND DECAF  ?TEA ( SUGAR OK NO MILK, CREAM, OR CREAMER) REGULAR AND DECAF  ?PLAIN JELLO ( NO RED)  ?FRUIT ICES ( NO RED, NO FRUIT PULP)  ?POPSICLES ( NO RED)  ?JUICE- APPLE, WHITE GRAPE AND WHITE CRANBERRY  ?SPORT DRINK LIKE GATORADE ( NO RED)  ?CLEAR BROTH ( VEGETABLE , CHICKEN OR BEEF)                                                               ? ?    ? ?BRUSH YOUR TEETH MORNING OF SURGERY AND RINSE YOUR MOUTH OUT, NO CHEWING GUM CANDY OR MINTS. ?  ? ? Take these medicines the morning of surgery with A SIP OF WATER:  protonix  ? ? ? ? ?DO NOT TAKE ANY DIABETIC MEDICATIONS DAY OF YOUR SURGERY ?                  ?            You may not have any metal on your body including hair pins and  ?            piercings  Do not wear jewelry, make-up, lotions, powders or perfumes, deodorant ?            Do not wear nail polish on your fingernails.   ?           IF YOU ARE A FEMALE AND WANT TO SHAVE UNDER ARMS OR LEGS PRIOR TO  SURGERY YOU MUST DO SO AT LEAST 48 HOURS PRIOR TO SURGERY.  ?            Men may shave face and neck. ? ? Do not bring valuables to the hospital. [facility] NOT ?  RESPONSIBLE   FOR VALUABLES. ? Contacts, dentures or bridgework may not be worn into surgery. ? Leave suitcase in the car. After surgery it may be brought to your room. ? ?  ? Patients discharged the day of surgery will not be allowed to drive home. IF YOU ARE HAVING SURGERY AND GOING HOME THE SAME DAY, YOU MUST HAVE AN ADULT TO DRIVE YOU HOME AND BE WITH YOU FOR 24 HOURS. YOU MAY GO HOME BY TAXI OR UBER OR ORTHERWISE, BUT AN ADULT MUST ACCOMPANY YOU HOME AND STAY WITH YOU FOR 24 HOURS. ?  ? ?            Please read over the following fact sheets you were given: ?_____________________________________________________________________ ? ?[place] - Preparing for Surgery ?Before surgery, you can play an important role.  Because skin is not sterile, your skin needs to be as free of germs as possible.  You can reduce the number of germs on your skin by washing with CHG (chlorahexidine gluconate) soap before surgery.  CHG is an antiseptic cleaner which kills germs and bonds with the skin to continue killing germs even after washing. ?Please DO NOT use if you have an allergy to CHG or antibacterial soaps.  If your skin becomes reddened/irritated stop using the CHG and inform your nurse when you arrive at Short Stay. ?Do not shave (including legs and underarms) for at least 48 hours prior to the first CHG shower.  You may shave your face/neck. ?Please follow these instructions carefully: ? 1.  Shower with CHG Soap the night before surgery and the  morning of Surgery. ? 2.  If you choose to wash your hair, wash your hair first as usual with your  normal  shampoo. ? 3.  After you shampoo, rinse your hair and body thoroughly to remove the  shampoo.                           4.  Use CHG as you would any other liquid soap.  You can apply chg directly   to the skin and wash  ?                     Gently with a scrungie or clean washcloth. ? 5.  Apply the CHG Soap to your body ONLY FROM THE NECK DOWN.   Do not use on face/ open      ?                     Wound or open sores. Avoid contact with eyes, ears mouth and genitals (private parts).  ?                     Production manager,  Genitals (private parts) with your normal soap. ?            6.  Wash thoroughly, paying special attention to the area where your surgery  will be performed. ? 7.  Thoroughly rinse your body with warm water from the neck down. ? 8.  DO NOT shower/wash with your normal soap after using and rinsing off  the CHG Soap. ?               9.  Pat yourself dry with a clean towel. ?           10.  Wear clean pajamas. ?  11.  Place clean sheets on your bed the night of your first shower and do not  sleep with pets. ?Day of Surgery : ?Do not apply any lotions/deodorants the morning of surgery.  Please wear clean clothes to the hospital/surgery center. ? ?FAILURE TO FOLLOW THESE INSTRUCTIONS MAY RESULT IN THE CANCELLATION OF YOUR SURGERY ?PATIENT SIGNATURE_________________________________ ? ?NURSE SIGNATURE__________________________________ ? ?________________________________________________________________________  ? ? ?           ?

## 2022-02-27 ENCOUNTER — Encounter (HOSPITAL_COMMUNITY)
Admission: RE | Admit: 2022-02-27 | Discharge: 2022-02-27 | Disposition: A | Discharge status: Home / Self Care | Payer: BC Managed Care – PPO | Source: Ambulatory Visit | Attending: Orthopedic Surgery | Admitting: Orthopedic Surgery

## 2022-02-27 ENCOUNTER — Encounter (HOSPITAL_COMMUNITY): Payer: Self-pay

## 2022-02-27 ENCOUNTER — Other Ambulatory Visit: Payer: Self-pay

## 2022-02-27 VITALS — BP 129/79 | HR 65 | Temp 98.7°F | Resp 16 | Ht 63.0 in | Wt 241.0 lb

## 2022-02-27 DIAGNOSIS — Z01812 Encounter for preprocedural laboratory examination: Secondary | ICD-10-CM | POA: Diagnosis not present

## 2022-02-27 DIAGNOSIS — Z01818 Encounter for other preprocedural examination: Secondary | ICD-10-CM

## 2022-02-27 LAB — BASIC METABOLIC PANEL
Anion gap: 8 (ref 5–15)
BUN: 15 mg/dL (ref 8–23)
CO2: 24 mmol/L (ref 22–32)
Calcium: 9 mg/dL (ref 8.9–10.3)
Chloride: 106 mmol/L (ref 98–111)
Creatinine, Ser: 1.01 mg/dL — ABNORMAL HIGH (ref 0.44–1.00)
GFR, Estimated: >60 mL/min (ref >60)
Glucose, Bld: 96 mg/dL (ref 70–99)
Potassium: 3.4 mmol/L — ABNORMAL LOW (ref 3.5–5.1)
Sodium: 138 mmol/L (ref 135–145)

## 2022-02-27 LAB — CBC
HCT: 40.9 % (ref 36.0–46.0)
Hemoglobin: 13.8 g/dL (ref 12.0–15.0)
MCH: 28.9 pg (ref 26.0–34.0)
MCHC: 33.7 g/dL (ref 30.0–36.0)
MCV: 85.6 fL (ref 80.0–100.0)
Platelets: 213 10*3/uL (ref 150–400)
RBC: 4.78 MIL/uL (ref 3.87–5.11)
RDW: 14.2 % (ref 11.5–15.5)
WBC: 4.3 10*3/uL (ref 4.0–10.5)
nRBC: 0 % (ref 0.0–0.2)

## 2022-02-27 LAB — SURGICAL PCR SCREEN
MRSA, PCR: NEGATIVE
Staphylococcus aureus: NEGATIVE

## 2022-02-27 LAB — GLUCOSE, CAPILLARY: Glucose-Capillary: 89 mg/dL (ref 70–99)

## 2022-02-27 LAB — HEMOGLOBIN A1C
Hgb A1c MFr Bld: 6 % — ABNORMAL HIGH (ref 4.8–5.6)
Mean Plasma Glucose: 125.5 mg/dL

## 2022-03-03 ENCOUNTER — Telehealth: Payer: Self-pay

## 2022-03-03 NOTE — Telephone Encounter (Signed)
Patient left message on voicemail stating she is having a repeat knee replacement surgery and she wanted to be sure Dr. Aline Brochure was aware. ?

## 2022-03-04 NOTE — Telephone Encounter (Signed)
FYI from patient. Letting you know about upcoming knee replacement ?

## 2022-03-05 ENCOUNTER — Encounter (HOSPITAL_COMMUNITY): Payer: Self-pay | Admitting: Orthopedic Surgery

## 2022-03-05 ENCOUNTER — Inpatient Hospital Stay (HOSPITAL_COMMUNITY): Payer: BC Managed Care – PPO | Admitting: Anesthesiology

## 2022-03-05 ENCOUNTER — Inpatient Hospital Stay (HOSPITAL_COMMUNITY): Payer: BC Managed Care – PPO | Admitting: Physician Assistant

## 2022-03-05 ENCOUNTER — Encounter (HOSPITAL_COMMUNITY)
Admission: RE | Disposition: A | Discharge status: Home / Self Care | Payer: Self-pay | Source: Ambulatory Visit | Attending: Orthopedic Surgery

## 2022-03-05 ENCOUNTER — Inpatient Hospital Stay (HOSPITAL_COMMUNITY)
Admission: RE | Admit: 2022-03-05 | Discharge: 2022-03-08 | DRG: 488 | Disposition: A | Discharge status: Home / Self Care | Payer: BC Managed Care – PPO | Source: Ambulatory Visit | Attending: Orthopedic Surgery | Admitting: Orthopedic Surgery

## 2022-03-05 ENCOUNTER — Other Ambulatory Visit: Payer: Self-pay

## 2022-03-05 DIAGNOSIS — G8918 Other acute postprocedural pain: Secondary | ICD-10-CM | POA: Diagnosis not present

## 2022-03-05 DIAGNOSIS — Z96659 Presence of unspecified artificial knee joint: Secondary | ICD-10-CM

## 2022-03-05 DIAGNOSIS — Z90710 Acquired absence of both cervix and uterus: Secondary | ICD-10-CM | POA: Diagnosis not present

## 2022-03-05 DIAGNOSIS — Z7984 Long term (current) use of oral hypoglycemic drugs: Secondary | ICD-10-CM

## 2022-03-05 DIAGNOSIS — Z79899 Other long term (current) drug therapy: Secondary | ICD-10-CM

## 2022-03-05 DIAGNOSIS — M1712 Unilateral primary osteoarthritis, left knee: Secondary | ICD-10-CM | POA: Diagnosis not present

## 2022-03-05 DIAGNOSIS — F410 Panic disorder [episodic paroxysmal anxiety] without agoraphobia: Secondary | ICD-10-CM | POA: Diagnosis not present

## 2022-03-05 DIAGNOSIS — G8929 Other chronic pain: Secondary | ICD-10-CM | POA: Diagnosis not present

## 2022-03-05 DIAGNOSIS — R112 Nausea with vomiting, unspecified: Secondary | ICD-10-CM | POA: Diagnosis not present

## 2022-03-05 DIAGNOSIS — E7800 Pure hypercholesterolemia, unspecified: Secondary | ICD-10-CM | POA: Diagnosis present

## 2022-03-05 DIAGNOSIS — Y792 Prosthetic and other implants, materials and accessory orthopedic devices associated with adverse incidents: Secondary | ICD-10-CM | POA: Diagnosis present

## 2022-03-05 DIAGNOSIS — T84023A Instability of internal left knee prosthesis, initial encounter: Secondary | ICD-10-CM | POA: Diagnosis not present

## 2022-03-05 DIAGNOSIS — T84013A Broken internal left knee prosthesis, initial encounter: Secondary | ICD-10-CM | POA: Diagnosis not present

## 2022-03-05 DIAGNOSIS — E119 Type 2 diabetes mellitus without complications: Secondary | ICD-10-CM | POA: Diagnosis present

## 2022-03-05 DIAGNOSIS — Z6841 Body Mass Index (BMI) 40.0 and over, adult: Secondary | ICD-10-CM

## 2022-03-05 DIAGNOSIS — G4730 Sleep apnea, unspecified: Secondary | ICD-10-CM | POA: Diagnosis not present

## 2022-03-05 DIAGNOSIS — Z96652 Presence of left artificial knee joint: Secondary | ICD-10-CM | POA: Diagnosis not present

## 2022-03-05 DIAGNOSIS — T84018A Broken internal joint prosthesis, other site, initial encounter: Principal | ICD-10-CM

## 2022-03-05 DIAGNOSIS — T84093A Other mechanical complication of internal left knee prosthesis, initial encounter: Secondary | ICD-10-CM

## 2022-03-05 DIAGNOSIS — Z794 Long term (current) use of insulin: Secondary | ICD-10-CM | POA: Diagnosis not present

## 2022-03-05 DIAGNOSIS — I10 Essential (primary) hypertension: Secondary | ICD-10-CM | POA: Diagnosis not present

## 2022-03-05 DIAGNOSIS — K219 Gastro-esophageal reflux disease without esophagitis: Secondary | ICD-10-CM | POA: Diagnosis not present

## 2022-03-05 DIAGNOSIS — Z833 Family history of diabetes mellitus: Secondary | ICD-10-CM | POA: Diagnosis not present

## 2022-03-05 DIAGNOSIS — Z800 Family history of malignant neoplasm of digestive organs: Secondary | ICD-10-CM | POA: Diagnosis not present

## 2022-03-05 HISTORY — PX: TOTAL KNEE REVISION: SHX996

## 2022-03-05 LAB — GLUCOSE, CAPILLARY
Glucose-Capillary: 121 mg/dL — ABNORMAL HIGH (ref 70–99)
Glucose-Capillary: 88 mg/dL (ref 70–99)
Glucose-Capillary: 99 mg/dL (ref 70–99)
Glucose-Capillary: 140 mg/dL — ABNORMAL HIGH (ref 70–99)

## 2022-03-05 LAB — TYPE AND SCREEN
ABO/RH(D): A POS
Antibody Screen: NEGATIVE

## 2022-03-05 SURGERY — TOTAL KNEE REVISION
Anesthesia: Spinal | Site: Knee | Laterality: Left

## 2022-03-05 MED ORDER — METOCLOPRAMIDE HCL 5 MG PO TABS: 5.0000 mg | ORAL_TABLET | Freq: Three times a day (TID) | ORAL | Status: DC | PRN

## 2022-03-05 MED ORDER — DOCUSATE SODIUM 100 MG PO CAPS
100.0000 mg | ORAL_CAPSULE | Freq: Two times a day (BID) | ORAL | Status: DC
  Administered 2022-03-05 – 2022-03-08 (×6): 100 mg via ORAL
  Filled 2022-03-05 (×9): qty 1

## 2022-03-05 MED ORDER — OXYCODONE HCL 5 MG PO TABS
ORAL_TABLET | ORAL | Status: AC
  Filled 2022-03-05: qty 1

## 2022-03-05 MED ORDER — LACTATED RINGERS IV SOLN: INTRAVENOUS | Status: DC

## 2022-03-05 MED ORDER — ONDANSETRON HCL 4 MG/2ML IJ SOLN
INTRAMUSCULAR | Status: DC | PRN
Start: 2022-03-05 — End: 2022-03-05
  Administered 2022-03-05: 4 mg via INTRAVENOUS

## 2022-03-05 MED ORDER — BUPIVACAINE IN DEXTROSE 0.75-8.25 % IT SOLN
INTRATHECAL | Status: DC | PRN
  Administered 2022-03-05: 12 mg via INTRATHECAL

## 2022-03-05 MED ORDER — HYDROCHLOROTHIAZIDE 25 MG PO TABS
25.0000 mg | ORAL_TABLET | Freq: Every day | ORAL | Status: DC
Start: 2022-03-06 — End: 2022-03-08
  Administered 2022-03-06 – 2022-03-08 (×3): 25 mg via ORAL
  Filled 2022-03-05 (×3): qty 1

## 2022-03-05 MED ORDER — TRAMADOL HCL 50 MG PO TABS
50.0000 mg | ORAL_TABLET | Freq: Four times a day (QID) | ORAL | Status: DC | PRN
  Administered 2022-03-05: 50 mg via ORAL
  Administered 2022-03-06 – 2022-03-08 (×4): 100 mg via ORAL
  Administered 2022-03-08: 50 mg via ORAL
  Filled 2022-03-05 (×4): qty 2
  Filled 2022-03-05: qty 1
  Filled 2022-03-05: qty 2
  Filled 2022-03-05: qty 1
  Filled 2022-03-05 (×2): qty 2

## 2022-03-05 MED ORDER — MIDAZOLAM HCL 2 MG/2ML IJ SOLN: 0.5000 mg | Freq: Once | INTRAMUSCULAR | Status: DC | PRN

## 2022-03-05 MED ORDER — FAMOTIDINE 20 MG PO TABS
40.0000 mg | ORAL_TABLET | Freq: Every day | ORAL | Status: DC
  Administered 2022-03-05 – 2022-03-07 (×3): 40 mg via ORAL
  Filled 2022-03-05 (×6): qty 2

## 2022-03-05 MED ORDER — HYDROMORPHONE HCL 1 MG/ML IJ SOLN
0.5000 mg | INTRAMUSCULAR | Status: DC | PRN
  Administered 2022-03-05 – 2022-03-06 (×2): 1 mg via INTRAVENOUS
  Filled 2022-03-05 (×2): qty 1

## 2022-03-05 MED ORDER — ACETAMINOPHEN 10 MG/ML IV SOLN: 1000.0000 mg | Freq: Four times a day (QID) | INTRAVENOUS | Status: DC

## 2022-03-05 MED ORDER — TRANEXAMIC ACID-NACL 1000-0.7 MG/100ML-% IV SOLN
1000.0000 mg | INTRAVENOUS | Status: AC
  Administered 2022-03-05: 1000 mg via INTRAVENOUS
  Filled 2022-03-05: qty 100

## 2022-03-05 MED ORDER — OXYCODONE HCL 5 MG PO TABS
5.0000 mg | ORAL_TABLET | ORAL | Status: DC | PRN
  Administered 2022-03-05 – 2022-03-06 (×3): 10 mg via ORAL
  Filled 2022-03-05 (×3): qty 2

## 2022-03-05 MED ORDER — ORAL CARE MOUTH RINSE: 15.0000 mL | Freq: Once | OROMUCOSAL | Status: AC

## 2022-03-05 MED ORDER — INSULIN ASPART 100 UNIT/ML IJ SOLN: 0.0000 [IU] | Freq: Every day | INTRAMUSCULAR | Status: DC

## 2022-03-05 MED ORDER — CHLORHEXIDINE GLUCONATE 0.12 % MT SOLN
15.0000 mL | Freq: Once | OROMUCOSAL | Status: AC
  Administered 2022-03-05: 15 mL via OROMUCOSAL

## 2022-03-05 MED ORDER — METOCLOPRAMIDE HCL 5 MG/ML IJ SOLN
5.0000 mg | Freq: Three times a day (TID) | INTRAMUSCULAR | Status: DC | PRN
  Administered 2022-03-07: 10 mg via INTRAVENOUS
  Filled 2022-03-05: qty 2

## 2022-03-05 MED ORDER — FENTANYL CITRATE PF 50 MCG/ML IJ SOSY
50.0000 ug | PREFILLED_SYRINGE | INTRAMUSCULAR | Status: DC
  Administered 2022-03-05: 100 ug via INTRAVENOUS
  Filled 2022-03-05: qty 2

## 2022-03-05 MED ORDER — PHENOL 1.4 % MT LIQD: 1.0000 | OROMUCOSAL | Status: DC | PRN

## 2022-03-05 MED ORDER — PANTOPRAZOLE SODIUM 40 MG PO TBEC
40.0000 mg | DELAYED_RELEASE_TABLET | Freq: Every day | ORAL | Status: DC
  Administered 2022-03-05 – 2022-03-08 (×4): 40 mg via ORAL
  Filled 2022-03-05 (×4): qty 1

## 2022-03-05 MED ORDER — ROPIVACAINE HCL 7.5 MG/ML IJ SOLN
INTRAMUSCULAR | Status: DC | PRN
  Administered 2022-03-05: 20 mL via PERINEURAL

## 2022-03-05 MED ORDER — BUPIVACAINE LIPOSOME 1.3 % IJ SUSP
INTRAMUSCULAR | Status: AC
  Filled 2022-03-05: qty 20

## 2022-03-05 MED ORDER — CEFAZOLIN SODIUM-DEXTROSE 2-4 GM/100ML-% IV SOLN
2.0000 g | INTRAVENOUS | Status: AC
  Administered 2022-03-05: 2 g via INTRAVENOUS
  Filled 2022-03-05: qty 100

## 2022-03-05 MED ORDER — OXYCODONE HCL 5 MG/5ML PO SOLN: 5.0000 mg | Freq: Once | ORAL | Status: AC | PRN

## 2022-03-05 MED ORDER — LIDOCAINE 2% (20 MG/ML) 5 ML SYRINGE
INTRAMUSCULAR | Status: DC | PRN
  Administered 2022-03-05: 40 mg via INTRAVENOUS

## 2022-03-05 MED ORDER — ONDANSETRON HCL 4 MG/2ML IJ SOLN
4.0000 mg | Freq: Four times a day (QID) | INTRAMUSCULAR | Status: DC | PRN
  Administered 2022-03-07: 4 mg via INTRAVENOUS
  Filled 2022-03-05: qty 2

## 2022-03-05 MED ORDER — PHENYLEPHRINE 80 MCG/ML (10ML) SYRINGE FOR IV PUSH (FOR BLOOD PRESSURE SUPPORT)
PREFILLED_SYRINGE | INTRAVENOUS | Status: AC
  Filled 2022-03-05: qty 10

## 2022-03-05 MED ORDER — PROPOFOL 10 MG/ML IV BOLUS
INTRAVENOUS | Status: DC | PRN
  Administered 2022-03-05: 20 mg via INTRAVENOUS

## 2022-03-05 MED ORDER — POVIDONE-IODINE 10 % EX SWAB
2.0000 "application " | Freq: Once | CUTANEOUS | Status: AC
  Administered 2022-03-05: 2 via TOPICAL

## 2022-03-05 MED ORDER — METHOCARBAMOL 1000 MG/10ML IJ SOLN
500.0000 mg | Freq: Four times a day (QID) | INTRAVENOUS | Status: DC | PRN
  Filled 2022-03-05: qty 5

## 2022-03-05 MED ORDER — HYDROMORPHONE HCL 1 MG/ML IJ SOLN: 0.2500 mg | INTRAMUSCULAR | Status: DC | PRN

## 2022-03-05 MED ORDER — MEPERIDINE HCL 50 MG/ML IJ SOLN: 6.2500 mg | INTRAMUSCULAR | Status: DC | PRN

## 2022-03-05 MED ORDER — CEFAZOLIN SODIUM-DEXTROSE 2-4 GM/100ML-% IV SOLN
2.0000 g | Freq: Four times a day (QID) | INTRAVENOUS | Status: AC
  Administered 2022-03-05 – 2022-03-06 (×2): 2 g via INTRAVENOUS
  Filled 2022-03-05 (×2): qty 100

## 2022-03-05 MED ORDER — DIPHENHYDRAMINE HCL 12.5 MG/5ML PO ELIX
12.5000 mg | ORAL_SOLUTION | ORAL | Status: DC | PRN
  Administered 2022-03-06: 25 mg via ORAL
  Filled 2022-03-05 (×4): qty 10

## 2022-03-05 MED ORDER — MIDAZOLAM HCL 2 MG/2ML IJ SOLN
1.0000 mg | INTRAMUSCULAR | Status: DC
  Administered 2022-03-05: 2 mg via INTRAVENOUS
  Filled 2022-03-05: qty 2

## 2022-03-05 MED ORDER — RIVAROXABAN 10 MG PO TABS
10.0000 mg | ORAL_TABLET | Freq: Every day | ORAL | Status: DC
  Filled 2022-03-05: qty 1

## 2022-03-05 MED ORDER — MENTHOL 3 MG MT LOZG: 1.0000 | LOZENGE | OROMUCOSAL | Status: DC | PRN

## 2022-03-05 MED ORDER — BUPIVACAINE LIPOSOME 1.3 % IJ SUSP: 20.0000 mL | Freq: Once | INTRAMUSCULAR | Status: DC

## 2022-03-05 MED ORDER — ONDANSETRON HCL 4 MG PO TABS
4.0000 mg | ORAL_TABLET | Freq: Four times a day (QID) | ORAL | Status: DC | PRN
  Administered 2022-03-06 (×2): 4 mg via ORAL
  Filled 2022-03-05 (×5): qty 1

## 2022-03-05 MED ORDER — PROPOFOL 500 MG/50ML IV EMUL
INTRAVENOUS | Status: DC | PRN
  Administered 2022-03-05: 75 ug/kg/min via INTRAVENOUS

## 2022-03-05 MED ORDER — SODIUM CHLORIDE (PF) 0.9 % IJ SOLN
INTRAMUSCULAR | Status: AC
  Filled 2022-03-05: qty 10

## 2022-03-05 MED ORDER — DEXAMETHASONE SODIUM PHOSPHATE 10 MG/ML IJ SOLN: 8.0000 mg | Freq: Once | INTRAMUSCULAR | Status: DC

## 2022-03-05 MED ORDER — ROSUVASTATIN CALCIUM 20 MG PO TABS
40.0000 mg | ORAL_TABLET | Freq: Every day | ORAL | Status: DC
  Administered 2022-03-06 – 2022-03-08 (×3): 40 mg via ORAL
  Filled 2022-03-05 (×3): qty 2

## 2022-03-05 MED ORDER — PHENYLEPHRINE 80 MCG/ML (10ML) SYRINGE FOR IV PUSH (FOR BLOOD PRESSURE SUPPORT)
PREFILLED_SYRINGE | INTRAVENOUS | Status: DC | PRN
Start: 2022-03-05 — End: 2022-03-05
  Administered 2022-03-05 (×3): 80 ug via INTRAVENOUS

## 2022-03-05 MED ORDER — POLYETHYLENE GLYCOL 3350 17 G PO PACK: 17.0000 g | PACK | Freq: Every day | ORAL | Status: DC | PRN

## 2022-03-05 MED ORDER — SODIUM CHLORIDE (PF) 0.9 % IJ SOLN
INTRAMUSCULAR | Status: DC | PRN
Start: 2022-03-05 — End: 2022-03-05
  Administered 2022-03-05: 60 mL via INTRAVENOUS

## 2022-03-05 MED ORDER — BISACODYL 10 MG RE SUPP: 10.0000 mg | Freq: Every day | RECTAL | Status: DC | PRN

## 2022-03-05 MED ORDER — SODIUM CHLORIDE (PF) 0.9 % IJ SOLN
INTRAMUSCULAR | Status: AC
  Filled 2022-03-05: qty 50

## 2022-03-05 MED ORDER — ACETAMINOPHEN 500 MG PO TABS
1000.0000 mg | ORAL_TABLET | Freq: Four times a day (QID) | ORAL | Status: AC
  Administered 2022-03-05 – 2022-03-06 (×3): 1000 mg via ORAL
  Filled 2022-03-05 (×4): qty 2

## 2022-03-05 MED ORDER — BUPIVACAINE LIPOSOME 1.3 % IJ SUSP
INTRAMUSCULAR | Status: DC | PRN
  Administered 2022-03-05: 20 mL

## 2022-03-05 MED ORDER — OXYCODONE HCL 5 MG PO TABS
5.0000 mg | ORAL_TABLET | Freq: Once | ORAL | Status: AC | PRN
  Administered 2022-03-05: 5 mg via ORAL

## 2022-03-05 MED ORDER — INSULIN ASPART 100 UNIT/ML IJ SOLN
0.0000 [IU] | Freq: Three times a day (TID) | INTRAMUSCULAR | Status: DC
  Administered 2022-03-06 (×2): 2 [IU] via SUBCUTANEOUS
  Administered 2022-03-08: 3 [IU] via SUBCUTANEOUS

## 2022-03-05 MED ORDER — METHOCARBAMOL 500 MG PO TABS
500.0000 mg | ORAL_TABLET | Freq: Four times a day (QID) | ORAL | Status: DC | PRN
  Administered 2022-03-05 – 2022-03-07 (×4): 500 mg via ORAL
  Filled 2022-03-05 (×7): qty 1

## 2022-03-05 MED ORDER — ACETAMINOPHEN 10 MG/ML IV SOLN
1000.0000 mg | Freq: Once | INTRAVENOUS | Status: AC
  Administered 2022-03-05: 1000 mg via INTRAVENOUS
  Filled 2022-03-05: qty 100

## 2022-03-05 MED ORDER — SODIUM CHLORIDE 0.9 % IV SOLN: INTRAVENOUS | Status: DC

## 2022-03-05 MED ORDER — DEXAMETHASONE SODIUM PHOSPHATE 10 MG/ML IJ SOLN
INTRAMUSCULAR | Status: DC | PRN
  Administered 2022-03-05: 6 mg via INTRAVENOUS

## 2022-03-05 MED ORDER — FLEET ENEMA 7-19 GM/118ML RE ENEM: 1.0000 | ENEMA | Freq: Once | RECTAL | Status: DC | PRN

## 2022-03-05 MED ORDER — INSULIN DETEMIR 100 UNIT/ML ~~LOC~~ SOLN
10.0000 [IU] | Freq: Every day | SUBCUTANEOUS | Status: DC
  Administered 2022-03-05 – 2022-03-07 (×3): 10 [IU] via SUBCUTANEOUS
  Filled 2022-03-05 (×4): qty 0.1

## 2022-03-05 SURGICAL SUPPLY — 60 items
BAG COUNTER SPONGE SURGICOUNT (BAG) IMPLANT
BAG DECANTER FOR FLEXI CONT (MISCELLANEOUS) ×2 IMPLANT
BAG ZIPLOCK 12X15 (MISCELLANEOUS) IMPLANT
BLADE SAG 18X100X1.27 (BLADE) ×2 IMPLANT
BLADE SAW SGTL 11.0X1.19X90.0M (BLADE) ×2 IMPLANT
BLADE SURG SZ10 CARB STEEL (BLADE) IMPLANT
BNDG ELASTIC 6X5.8 VLCR STR LF (GAUZE/BANDAGES/DRESSINGS) ×2 IMPLANT
CLSR STERI-STRIP ANTIMIC 1/2X4 (GAUZE/BANDAGES/DRESSINGS) ×1 IMPLANT
COVER SURGICAL LIGHT HANDLE (MISCELLANEOUS) ×2 IMPLANT
CUFF TOURN SGL QUICK 34 (TOURNIQUET CUFF) ×2
CUFF TRNQT CYL 34X4.125X (TOURNIQUET CUFF) ×1 IMPLANT
DRAPE INCISE IOBAN 66X45 STRL (DRAPES) ×2 IMPLANT
DRAPE U-SHAPE 47X51 STRL (DRAPES) ×2 IMPLANT
DRSG ADAPTIC 3X8 NADH LF (GAUZE/BANDAGES/DRESSINGS) ×2 IMPLANT
DRSG AQUACEL AG ADV 3.5X10 (GAUZE/BANDAGES/DRESSINGS) ×1 IMPLANT
DRSG PAD ABDOMINAL 8X10 ST (GAUZE/BANDAGES/DRESSINGS) ×2 IMPLANT
DURAPREP 26ML APPLICATOR (WOUND CARE) ×2 IMPLANT
ELECT REM PT RETURN 15FT ADLT (MISCELLANEOUS) ×2 IMPLANT
EVACUATOR 1/8 PVC DRAIN (DRAIN) IMPLANT
GAUZE SPONGE 4X4 12PLY STRL (GAUZE/BANDAGES/DRESSINGS) ×2 IMPLANT
GLOVE BIO SURGEON STRL SZ 6.5 (GLOVE) ×4 IMPLANT
GLOVE BIO SURGEON STRL SZ7 (GLOVE) ×2 IMPLANT
GLOVE BIO SURGEON STRL SZ8 (GLOVE) ×4 IMPLANT
GLOVE BIOGEL PI IND STRL 7.0 (GLOVE) ×1 IMPLANT
GLOVE BIOGEL PI IND STRL 8 (GLOVE) ×1 IMPLANT
GLOVE BIOGEL PI IND STRL 8.5 (GLOVE) IMPLANT
GLOVE BIOGEL PI INDICATOR 7.0 (GLOVE) ×1
GLOVE BIOGEL PI INDICATOR 8 (GLOVE) ×1
GLOVE BIOGEL PI INDICATOR 8.5 (GLOVE)
GOWN SPEC L4 XLG W/TWL (GOWN DISPOSABLE) ×4 IMPLANT
GOWN STRL REUS W/ TWL LRG LVL3 (GOWN DISPOSABLE) ×2 IMPLANT
GOWN STRL REUS W/TWL LRG LVL3 (GOWN DISPOSABLE) ×4
HANDPIECE INTERPULSE COAX TIP (DISPOSABLE) ×2
HOLDER FOLEY CATH W/STRAP (MISCELLANEOUS) IMPLANT
IMMOBILIZER KNEE 20 (SOFTGOODS) ×2
IMMOBILIZER KNEE 20 THIGH 36 (SOFTGOODS) ×1 IMPLANT
INSERT CROSS LINK 12.5 SZ4 (Knees) ×1 IMPLANT
KIT TURNOVER KIT A (KITS) IMPLANT
MANIFOLD NEPTUNE II (INSTRUMENTS) ×2 IMPLANT
NS IRRIG 1000ML POUR BTL (IV SOLUTION) ×2 IMPLANT
PACK TOTAL KNEE CUSTOM (KITS) ×2 IMPLANT
PADDING CAST COTTON 6X4 STRL (CAST SUPPLIES) ×3 IMPLANT
PROTECTOR NERVE ULNAR (MISCELLANEOUS) ×2 IMPLANT
SET HNDPC FAN SPRY TIP SCT (DISPOSABLE) ×1 IMPLANT
SPIKE FLUID TRANSFER (MISCELLANEOUS) IMPLANT
STRIP CLOSURE SKIN 1/2X4 (GAUZE/BANDAGES/DRESSINGS) ×2 IMPLANT
SUT MNCRL AB 4-0 PS2 18 (SUTURE) ×2 IMPLANT
SUT STRATAFIX 0 PDS 27 VIOLET (SUTURE) ×2
SUT VIC AB 2-0 CT1 27 (SUTURE) ×6
SUT VIC AB 2-0 CT1 TAPERPNT 27 (SUTURE) ×3 IMPLANT
SUTURE STRATFX 0 PDS 27 VIOLET (SUTURE) ×1 IMPLANT
SWAB COLLECTION DEVICE MRSA (MISCELLANEOUS) IMPLANT
SWAB CULTURE ESWAB REG 1ML (MISCELLANEOUS) IMPLANT
SYR 50ML LL SCALE MARK (SYRINGE) ×4 IMPLANT
TOWER CARTRIDGE SMART MIX (DISPOSABLE) ×2 IMPLANT
TRAY FOLEY MTR SLVR 16FR STAT (SET/KITS/TRAYS/PACK) ×2 IMPLANT
TUBE KAMVAC SUCTION (TUBING) IMPLANT
TUBE SUCTION HIGH CAP CLEAR NV (SUCTIONS) ×2 IMPLANT
WATER STERILE IRR 1000ML POUR (IV SOLUTION) ×2 IMPLANT
WRAP KNEE MAXI GEL POST OP (GAUZE/BANDAGES/DRESSINGS) ×1 IMPLANT

## 2022-03-05 NOTE — Interval H&P Note (Signed)
History and Physical Interval Note: ? ?03/05/2022 ?1:03 PM ? ?Rhonda Patton  has presented today for surgery, with the diagnosis of Failed left total knee arthroplasty.  The various methods of treatment have been discussed with the patient and family. After consideration of risks, benefits and other options for treatment, the patient has consented to  Procedure(s): ?Left knee polyethylene vs total knee arthroplasty revision (Left) as a surgical intervention.  The patient's history has been reviewed, patient examined, no change in status, stable for surgery.  I have reviewed the patient's chart and labs.  Questions were answered to the patient's satisfaction.   ? ? ?Pilar Plate Dalilah Curlin ? ? ?

## 2022-03-05 NOTE — Progress Notes (Signed)
AssistedDr. Annye Asa with left, adductor canal, ultrasound guided block. Side rails up, monitors on throughout procedure. See vital signs in flow sheet. Tolerated Procedure well. ? ?

## 2022-03-05 NOTE — Anesthesia Procedure Notes (Signed)
Anesthesia Regional Block: Adductor canal block  ? ?Pre-Anesthetic Checklist: , timeout performed,  Correct Patient, Correct Site, Correct Laterality,  Correct Procedure, Correct Position, site marked,  Risks and benefits discussed,  Surgical consent,  Pre-op evaluation,  At surgeon's request and post-op pain management ? ?Laterality: Left and Lower ? ?Prep: chloraprep     ?  ?Needles:  ?Injection technique: Single-shot ? ?Needle Type: Echogenic Needle   ? ? ?Needle Length: 9cm  ?Needle Gauge: 21  ? ? ? ?Additional Needles: ? ? ?Procedures:,,,, ultrasound used (permanent image in chart),,    ?Narrative:  ?Start time: 03/05/2022 12:59 PM ?End time: 03/05/2022 1:05 PM ?Injection made incrementally with aspirations every 5 mL. ? ?Performed by: Personally  ?Anesthesiologist: Annye Asa, MD ? ?Additional Notes: ?Pt identified in Holding room.  Monitors applied. Working IV access confirmed. Sterile prep L thigh.  #21ga ECHOgenic Arrow block needle into adductor canal with US guidance.  20cc 0.75% Ropivacaine injected incrementally after negative test dose.  Patient asymptomatic, VSS, no heme aspirated, tolerated well.   Jenita Seashore, MD ? ? ? ? ?

## 2022-03-05 NOTE — Anesthesia Preprocedure Evaluation (Addendum)
Anesthesia Evaluation  ?Patient identified by MRN, date of birth, ID band ?Patient awake ? ? ? ?Reviewed: ?Allergy & Precautions, NPO status , Patient's Chart, lab work & pertinent test results ? ?History of Anesthesia Complications ?(+) PONV and history of anesthetic complications ? ?Airway ?Mallampati: II ? ?TM Distance: >3 FB ?Neck ROM: Full ? ? ? Dental ? ?(+) Dental Advisory Given ?  ?Pulmonary ?sleep apnea (does not use CPAP) ,  ?  ?breath sounds clear to auscultation ? ? ? ? ? ? Cardiovascular ?hypertension, Pt. on medications ?(-) angina ?Rhythm:Regular Rate:Normal ? ? ?  ?Neuro/Psych ?negative neurological ROS ?   ? GI/Hepatic ?Neg liver ROS, GERD  Medicated and Controlled,  ?Endo/Other  ?diabetes (glu 121), Insulin DependentMorbid obesity ? Renal/GU ?negative Renal ROS  ? ?  ?Musculoskeletal ? ?(+) Arthritis , Osteoarthritis,   ? Abdominal ?(+) + obese,   ?Peds ? Hematology ?plt 213k   ?Anesthesia Other Findings ? ? Reproductive/Obstetrics ? ?  ? ? ? ? ? ? ? ? ? ? ? ? ? ?  ?  ? ? ? ? ? ? ? ?Anesthesia Physical ?Anesthesia Plan ? ?ASA: 3 ? ?Anesthesia Plan: Spinal  ? ?Post-op Pain Management: Regional block* and Ofirmev IV (intra-op)*  ? ?Induction:  ? ?PONV Risk Score and Plan: 3 and Ondansetron, Dexamethasone and Treatment may vary due to age or medical condition ? ?Airway Management Planned: Natural Airway and Simple Face Mask ? ?Additional Equipment: None ? ?Intra-op Plan:  ? ?Post-operative Plan:  ? ?Informed Consent: I have reviewed the patients History and Physical, chart, labs and discussed the procedure including the risks, benefits and alternatives for the proposed anesthesia with the patient or authorized representative who has indicated his/her understanding and acceptance.  ? ? ? ?Dental advisory given ? ?Plan Discussed with: CRNA and Surgeon ? ?Anesthesia Plan Comments:   ? ? ? ? ? ?Anesthesia Quick Evaluation ? ?

## 2022-03-05 NOTE — Discharge Instructions (Addendum)
Rhonda Arabian, MD [facility] [address], [address] [place], [place] [address] [phone]  POSTOPERATIVE DIRECTIONS  Knee Rehabilitation, Guidelines Following Surgery  Results after knee surgery are often greatly improved when you follow the exercise, range of motion and muscle strengthening exercises prescribed by your doctor. Safety measures are also important to protect the knee from further injury. If any of these exercises cause you to have increased pain or swelling in your knee joint, decrease the amount until you are comfortable again and slowly increase them. If you have problems or questions, call your caregiver or physical therapist for advice.   BLOOD CLOT PREVENTION Take a 2.5 mg Eliquis twice a day for three weeks following surgery. You may resume your vitamins/supplements once you have discontinued the Eliquis. Do not take any NSAIDs (Advil, Aleve, Ibuprofen, Meloxicam, etc.) until you have discontinued the Eliquis.  HOME CARE INSTRUCTIONS  Remove items at home which could result in a fall. This includes throw rugs or furniture in walking pathways.  ICE to the affected knee as much as tolerated. Icing helps control swelling. If the swelling is well controlled you will be more comfortable and rehab easier. Continue to use ice on the knee for pain and swelling from surgery. You may notice swelling that will progress down to the foot and ankle. This is normal after surgery. Elevate the leg when you are not up walking on it.    Continue to use the breathing machine which will help keep your temperature down. It is common for your temperature to cycle up and down following surgery, especially at night when you are not up moving around and exerting yourself. The breathing machine keeps your lungs expanded and your temperature down. Do not place pillow under the operative knee, focus on keeping the knee straight while resting  DIET You  may resume your previous home diet once you are discharged from the hospital.  DRESSING / WOUND CARE / SHOWERING Keep your bulky bandage on for 2 days. On the third post-operative day you may remove the Ace bandage and gauze. There is a waterproof adhesive bandage on your skin which will stay in place until your first follow-up appointment. Once you remove this you will not need to place another bandage You may begin showering 3 days following surgery, but do not submerge the incision under water.  ACTIVITY For the first 5 days, the key is rest and control of pain and swelling Do your home exercises twice a day starting on post-operative day 3. On the days you go to physical therapy, just do the home exercises once that day. You should rest, ice and elevate the leg for 50 minutes out of every hour. Get up and walk/stretch for 10 minutes per hour. After 5 days you can increase your activity slowly as tolerated. Walk with your walker as instructed. Use the walker until you are comfortable transitioning to a cane. Walk with the cane in the opposite hand of the operative leg. You may discontinue the cane once you are comfortable and walking steadily. Avoid periods of inactivity such as sitting longer than an hour when not asleep. This helps prevent blood clots.  You may discontinue the knee immobilizer once you are able to perform a straight leg raise while lying down. You may resume a sexual relationship in one month or when given the OK by your doctor.  You may return to work once you are cleared by your doctor.  Do not drive a car for 6 weeks or until released by your surgeon.  Do not drive while taking narcotics.  TED HOSE STOCKINGS Wear the elastic stockings on both legs for three weeks following surgery during the day. You may remove them at night for sleeping.  WEIGHT BEARING Weight bearing as tolerated with assist device (walker, cane, etc) as directed, use it as long as suggested by your  surgeon or therapist, typically at least 4-6 weeks.  POSTOPERATIVE CONSTIPATION PROTOCOL Constipation - defined medically as fewer than three stools per week and severe constipation as less than one stool per week.  One of the most common issues patients have following surgery is constipation.  Even if you have a regular bowel pattern at home, your normal regimen is likely to be disrupted due to multiple reasons following surgery.  Combination of anesthesia, postoperative narcotics, change in appetite and fluid intake all can affect your bowels.  In order to avoid complications following surgery, here are some recommendations in order to help you during your recovery period.  Colace (docusate) - Pick up an over-the-counter form of Colace or another stool softener and take twice a day as long as you are requiring postoperative pain medications.  Take with a full glass of water daily.  If you experience loose stools or diarrhea, hold the colace until you stool forms back up. If your symptoms do not get better within 1 week or if they get worse, check with your doctor. Dulcolax (bisacodyl) - Pick up over-the-counter and take as directed by the product packaging as needed to assist with the movement of your bowels.  Take with a full glass of water.  Use this product as needed if not relieved by Colace only.  MiraLax (polyethylene glycol) - Pick up over-the-counter to have on hand. MiraLax is a solution that will increase the amount of water in your bowels to assist with bowel movements.  Take as directed and can mix with a glass of water, juice, soda, coffee, or tea. Take if you go more than two days without a movement. Do not use MiraLax more than once per day. Call your doctor if you are still constipated or irregular after using this medication for 7 days in a row.  If you continue to have problems with postoperative constipation, please contact the office for further assistance and recommendations.  If  you experience "the worst abdominal pain ever" or develop nausea or vomiting, please contact the office immediatly for further recommendations for treatment.  ITCHING If you experience itching with your medications, try taking only a single pain pill, or even half a pain pill at a time.  You can also use Benadryl over the counter for itching or also to help with sleep.   MEDICATIONS See your medication summary on the "After Visit Summary" that the nursing staff will review with you prior to discharge.  You may have some home medications which will be placed on hold until you complete the course of blood thinner medication.  It is important for you to complete the blood thinner medication as prescribed by your surgeon.  Continue your approved medications as instructed at time of discharge.  PRECAUTIONS If you experience chest pain or shortness of breath - call 911 immediately for transfer to the hospital emergency department.  If you develop a fever greater that 101 F, purulent drainage from wound, increased redness or drainage from wound, foul odor from the wound/dressing, or calf pain - CONTACT YOUR SURGEON.  FOLLOW-UP APPOINTMENTS Make sure you keep all of your appointments after your operation with your surgeon and caregivers. You should call the office at the above phone number and make an appointment for approximately two weeks after the date of your surgery or on the date instructed by your surgeon outlined in the "After Visit Summary".  RANGE OF MOTION AND STRENGTHENING EXERCISES  Rehabilitation of the knee is important following a knee injury or an operation. After just a few days of immobilization, the muscles of the thigh which control the knee become weakened and shrink (atrophy). Knee exercises are designed to build up the tone and strength of the thigh muscles and to improve knee motion. Often times heat used for twenty to thirty minutes  before working out will loosen up your tissues and help with improving the range of motion but do not use heat for the first two weeks following surgery. These exercises can be done on a training (exercise) mat, on the floor, on a table or on a bed. Use what ever works the best and is most comfortable for you Knee exercises include:  Leg Lifts - While your knee is still immobilized in a splint or cast, you can do straight leg raises. Lift the leg to 60 degrees, hold for 3 sec, and slowly lower the leg. Repeat 10-20 times 2-3 times daily. Perform this exercise against resistance later as your knee gets better.  Quad and Hamstring Sets - Tighten up the muscle on the front of the thigh (Quad) and hold for 5-10 sec. Repeat this 10-20 times hourly. Hamstring sets are done by pushing the foot backward against an object and holding for 5-10 sec. Repeat as with quad sets.  Leg Slides: Lying on your back, slowly slide your foot toward your buttocks, bending your knee up off the floor (only go as far as is comfortable). Then slowly slide your foot back down until your leg is flat on the floor again. Angel Wings: Lying on your back spread your legs to the side as far apart as you can without causing discomfort.  A rehabilitation program following serious knee injuries can speed recovery and prevent re-injury in the future due to weakened muscles. Contact your doctor or a physical therapist for more information on knee rehabilitation.   POST-OPERATIVE OPIOID TAPER INSTRUCTIONS: It is important to wean off of your opioid medication as soon as possible. If you do not need pain medication after your surgery it is ok to stop day one. Opioids include: Codeine, Hydrocodone(Norco, Vicodin), Oxycodone(Percocet, oxycontin) and hydromorphone amongst others.  Long term and even short term use of opiods can cause: Increased pain response Dependence Constipation Depression Respiratory depression And more.  Withdrawal  symptoms can include Flu like symptoms Nausea, vomiting And more Techniques to manage these symptoms Hydrate well Eat regular healthy meals Stay active Use relaxation techniques(deep breathing, meditating, yoga) Do Not substitute Alcohol to help with tapering If you have been on opioids for less than two weeks and do not have pain than it is ok to stop all together.  Plan to wean off of opioids This plan should start within one week post op of your joint replacement. Maintain the same interval or time between taking each dose and first decrease the dose.  Cut the total daily intake of opioids by one tablet each day Next start to increase the time between doses. The last dose that should be eliminated is the evening dose.   IF YOU ARE TRANSFERRED TO  A SKILLED REHAB FACILITY If the patient is transferred to a skilled rehab facility following release from the hospital, a list of the current medications will be sent to the facility for the patient to continue.  When discharged from the skilled rehab facility, please have the facility set up the patient's [place] prior to being released. Also, the skilled facility will be responsible for providing the patient with their medications at time of release from the facility to include their pain medication, the muscle relaxants, and their blood thinner medication. If the patient is still at the rehab facility at time of the two week follow up appointment, the skilled rehab facility will also need to assist the patient in arranging follow up appointment in our office and any transportation needs.  MAKE SURE YOU:  Understand these instructions.  Get help right away if you are not doing well or get worse.   Pick up stool softner and laxative for home use following surgery while on pain medications. Do not submerge incision under water. Please use good hand washing techniques while changing dressing each day. May shower starting  three days after surgery. Please use a clean towel to pat the incision dry following showers. Continue to use ice for pain and swelling after surgery. Do not use any lotions or creams on the incision until instructed by your surgeon.     Information on my medicine - ELIQUIS (apixaban)  Why was Eliquis prescribed for you? Eliquis was prescribed for you to reduce the risk of blood clots forming after orthopedic surgery.    What do You need to know about Eliquis? Take your Eliquis TWICE DAILY - one tablet in the morning and one tablet in the evening with or without food.  It would be best to take the dose about the same time each day.  If you have difficulty swallowing the tablet whole please discuss with your pharmacist how to take the medication safely.  Take Eliquis exactly as prescribed by your doctor and DO NOT stop taking Eliquis without talking to the doctor who prescribed the medication.  Stopping without other medication to take the place of Eliquis may increase your risk of developing a clot.  After discharge, you should have regular check-up appointments with your healthcare provider that is prescribing your Eliquis.  What do you do if you miss a dose? If a dose of ELIQUIS is not taken at the scheduled time, take it as soon as possible on the same day and twice-daily administration should be resumed.  The dose should not be doubled to make up for a missed dose.  Do not take more than one tablet of ELIQUIS at the same time.  Important Safety Information A possible side effect of Eliquis is bleeding. You should call your healthcare provider right away if you experience any of the following: Bleeding from an injury or your nose that does not stop. Unusual colored urine (red or dark brown) or unusual colored stools (red or black). Unusual bruising for unknown reasons. A serious fall or if you hit your head (even if there is no bleeding).  Some medicines may interact with  Eliquis and might increase your risk of bleeding or clotting while on Eliquis. To help avoid this, consult your healthcare provider or pharmacist prior to using any new prescription or non-prescription medications, including herbals, vitamins, non-steroidal anti-inflammatory drugs (NSAIDs) and supplements.  This website has more information on Eliquis (apixaban): http://www.eliquis.com/eliquis/home

## 2022-03-05 NOTE — Anesthesia Procedure Notes (Signed)
Spinal ? ?Patient location during procedure: OR ?End time: 03/05/2022 1:28 PM ?Reason for block: surgical anesthesia ?Staffing ?Performed: anesthesiologist  ?Anesthesiologist: Annye Asa, MD ?Preanesthetic Checklist ?Completed: patient identified, IV checked, site marked, risks and benefits discussed, surgical consent, monitors and equipment checked, pre-op evaluation and timeout performed ?Spinal Block ?Patient position: sitting ?Prep: DuraPrep and site prepped and draped ?Patient monitoring: blood pressure, continuous pulse ox, cardiac monitor and heart rate ?Approach: midline ?Location: L3-4 ?Injection technique: single-shot ?Needle ?Needle type: Sprotte and Introducer  ?Needle gauge: 24 G ?Needle length: 12.7 cm ?Assessment ?Events: CSF return ?Additional Notes ?Pt identified in Operating room.  Monitors applied. Working IV access confirmed. Sterile prep, drape lumbar spine.  1% lido local L 3,4.  #24ga Pencan not long enough, repeat with longer Sprotte, clear CSF L 3,4.  '12mg'$  0.75% Bupivacaine with dextrose injected with asp CSF beginning and end of injection.  Patient asymptomatic, VSS, no heme aspirated, tolerated well.  Jenita Seashore, MD ?  ? ? ? ?

## 2022-03-05 NOTE — Anesthesia Postprocedure Evaluation (Signed)
Anesthesia Post Note ? ?Patient: Rhonda Patton ? ?Procedure(s) Performed: Left knee polyethylene revision (Left: Knee) ? ?  ? ?Patient location during evaluation: PACU ?Anesthesia Type: Spinal ?Level of consciousness: awake and alert, patient cooperative and oriented ?Pain management: pain level controlled ?Vital Signs Assessment: post-procedure vital signs reviewed and stable ?Respiratory status: spontaneous breathing, nonlabored ventilation and respiratory function stable ?Cardiovascular status: blood pressure returned to baseline and stable ?Postop Assessment: no apparent nausea or vomiting and spinal receding ?Anesthetic complications: no ? ? ?No notable events documented. ? ?Last Vitals:  ?Vitals:  ? 03/05/22 1530 03/05/22 1545  ?BP: (!) 141/66 (!) 147/80  ?Pulse: (!) 56 (!) 56  ?Resp: 19 18  ?Temp:  37 ?C  ?SpO2: 100% 100%  ?  ?Last Pain:  ?Vitals:  ? 03/05/22 1545  ?TempSrc:   ?PainSc: 0-No pain  ? ? ?  ?  ?  ?  ?  ?  ? ?Haide Klinker,E. Jametta [place]head ? ? ? ? ?

## 2022-03-05 NOTE — Transfer of Care (Signed)
Immediate Anesthesia Transfer of Care Note ? ?Patient: Rhonda Patton ? ?Procedure(s) Performed: Left knee polyethylene revision (Left: Knee) ? ?Patient Location: PACU ? ?Anesthesia Type:Spinal and MAC combined with regional for post-op pain ? ?Level of Consciousness: awake and patient cooperative ? ?Airway & Oxygen Therapy: Patient Spontanous Breathing and Patient connected to face mask oxygen ? ?Post-op Assessment: Report given to RN and Post -op Vital signs reviewed and stable ? ?Post vital signs: Reviewed and stable ? ?Last Vitals:  ?Vitals Value Taken Time  ?BP 126/76 03/05/22 1509  ?Temp    ?Pulse 58 03/05/22 1512  ?Resp 17 03/05/22 1512  ?SpO2 100 % 03/05/22 1512  ?Vitals shown include unvalidated device data. ? ?Last Pain:  ?Vitals:  ? 03/05/22 1307  ?TempSrc:   ?PainSc: 2   ?   ? ?Patients Stated Pain Goal: 3 (03/05/22 1233) ? ?Complications: No notable events documented. ?

## 2022-03-05 NOTE — Brief Op Note (Signed)
03/05/2022 ? ?2:45 PM ? ?PATIENT:  Rhonda Patton  61 y.o. female ? ?PRE-OPERATIVE DIAGNOSIS:  Failed left total knee arthroplasty ? ?POST-OPERATIVE DIAGNOSIS:  Failed left total knee arthroplasty ? ?PROCEDURE:  Procedure(s): ?Left knee polyethylene revision (Left) ? ?SURGEON:  Surgeon(s) and Role: ?   Gaynelle Arabian, MD - Primary ? ?PHYSICIAN ASSISTANT:  ? ?ASSISTANTS: Jaynie Bream, PA-C  ? ?ANESTHESIA:   regional and spinal ? ?EBL:  25 ml ? ?BLOOD ADMINISTERED:none ? ?DRAINS: none  ? ?LOCAL MEDICATIONS USED:  OTHER Exparel ? ?COUNTS:  YES ? ?TOURNIQUET:   ?Total Tourniquet Time Documented: ?Thigh (Left) - 50 minutes ?Total: Thigh (Left) - 50 minutes ? ? ?DICTATION: .Other Dictation: Dictation Number [id number] ? ?PLAN OF CARE: Admit for overnight observation ? ?PATIENT DISPOSITION:  PACU - hemodynamically stable. ?  ? ? ?

## 2022-03-05 NOTE — Plan of Care (Signed)
?  Problem: Education: ?Goal: Knowledge of General Education information will improve ?Description: Including pain rating scale, medication(s)/side effects and non-pharmacologic comfort measures ?Outcome: Progressing ?  ?Problem: Clinical Measurements: ?Goal: Will remain free from infection ?Outcome: Progressing ?  ?Problem: Activity: ?Goal: Risk for activity intolerance will decrease ?Outcome: Progressing ?  ?Problem: Nutrition: ?Goal: Adequate nutrition will be maintained ?Outcome: Progressing ?  ?Problem: Elimination: ?Goal: Will not experience complications related to bowel motility ?Outcome: Progressing ?Goal: Will not experience complications related to urinary retention ?Outcome: Progressing ?  ?Problem: Pain Managment: ?Goal: General experience of comfort will improve ?Outcome: Progressing ?  ?Problem: Safety: ?Goal: Ability to remain free from injury will improve ?Outcome: Progressing ?  ?Problem: Skin Integrity: ?Goal: Risk for impaired skin integrity will decrease ?Outcome: Progressing ?  ?

## 2022-03-06 ENCOUNTER — Other Ambulatory Visit (HOSPITAL_COMMUNITY): Payer: Self-pay

## 2022-03-06 LAB — GLUCOSE, CAPILLARY
Glucose-Capillary: 127 mg/dL — ABNORMAL HIGH (ref 70–99)
Glucose-Capillary: 124 mg/dL — ABNORMAL HIGH (ref 70–99)
Glucose-Capillary: 169 mg/dL — ABNORMAL HIGH (ref 70–99)

## 2022-03-06 LAB — BASIC METABOLIC PANEL
Anion gap: 9 (ref 5–15)
BUN: 11 mg/dL (ref 8–23)
CO2: 23 mmol/L (ref 22–32)
Calcium: 8.7 mg/dL — ABNORMAL LOW (ref 8.9–10.3)
Chloride: 108 mmol/L (ref 98–111)
Creatinine, Ser: 0.97 mg/dL (ref 0.44–1.00)
GFR, Estimated: >60 mL/min (ref >60)
Glucose, Bld: 139 mg/dL — ABNORMAL HIGH (ref 70–99)
Potassium: 3.8 mmol/L (ref 3.5–5.1)
Sodium: 140 mmol/L (ref 135–145)

## 2022-03-06 LAB — CBC
HCT: 39.3 % (ref 36.0–46.0)
Hemoglobin: 13 g/dL (ref 12.0–15.0)
MCH: 28.2 pg (ref 26.0–34.0)
MCHC: 33.1 g/dL (ref 30.0–36.0)
MCV: 85.2 fL (ref 80.0–100.0)
Platelets: 200 10*3/uL (ref 150–400)
RBC: 4.61 MIL/uL (ref 3.87–5.11)
RDW: 14.2 % (ref 11.5–15.5)
WBC: 7.2 10*3/uL (ref 4.0–10.5)
nRBC: 0 % (ref 0.0–0.2)

## 2022-03-06 MED ORDER — METHOCARBAMOL 500 MG PO TABS
500.0000 mg | ORAL_TABLET | Freq: Four times a day (QID) | ORAL | 0 refills | Status: DC | PRN
  Filled 2022-03-06: qty 40, 10d supply, fill #0

## 2022-03-06 MED ORDER — APIXABAN 2.5 MG PO TABS
2.5000 mg | ORAL_TABLET | Freq: Two times a day (BID) | ORAL | Status: DC
  Administered 2022-03-06 – 2022-03-08 (×5): 2.5 mg via ORAL
  Filled 2022-03-06 (×8): qty 1

## 2022-03-06 MED ORDER — RIVAROXABAN 10 MG PO TABS
10.0000 mg | ORAL_TABLET | Freq: Every day | ORAL | 0 refills | Status: DC
  Filled 2022-03-06: qty 20, 20d supply, fill #0

## 2022-03-06 MED ORDER — APIXABAN 2.5 MG PO TABS
2.5000 mg | ORAL_TABLET | Freq: Two times a day (BID) | ORAL | 0 refills | Status: DC
  Filled 2022-03-06: qty 42, 21d supply, fill #0

## 2022-03-06 MED ORDER — HYDROMORPHONE HCL 2 MG PO TABS
2.0000 mg | ORAL_TABLET | Freq: Four times a day (QID) | ORAL | Status: DC | PRN
  Administered 2022-03-07 (×3): 2 mg via ORAL
  Filled 2022-03-06 (×3): qty 1

## 2022-03-06 MED ORDER — OXYCODONE HCL 5 MG PO TABS
5.0000 mg | ORAL_TABLET | Freq: Four times a day (QID) | ORAL | 0 refills | Status: DC | PRN
  Filled 2022-03-06: qty 42, 6d supply, fill #0

## 2022-03-06 MED ORDER — APIXABAN 2.5 MG PO TABS
ORAL_TABLET | ORAL | 0 refills | Status: DC
  Filled 2022-03-06: qty 42, 21d supply, fill #0

## 2022-03-06 NOTE — Progress Notes (Signed)
Physical Therapy Treatment Patient Details Name: Rhonda Patton MRN: [id number] DOB: [birth year] Today's Date: 03/06/2022   History of Present Illness 61 yo female s/p L TK revision 5/17. Hx of L TKA 2018    PT Comments    No nausea reported this session but pt continues to c/o dizziness, "spinning" at rest and with activity. Pain rated 8/10. BP 154/86. Unfortunately, pt has not been able to meet her PT goals today. Will continue to progress activity as safely able.     Recommendations for follow up therapy are one component of a multi-disciplinary discharge planning process, led by the attending physician.  Recommendations may be updated based on patient status, additional functional criteria and insurance authorization.  Follow Up Recommendations  Follow physician's recommendations for discharge plan and follow up therapies     Assistance Recommended at Discharge Intermittent Supervision/Assistance  Patient can return home with the following A little help with walking and/or transfers;Assist for transportation;Help with stairs or ramp for entrance;A little help with bathing/dressing/bathroom   Equipment Recommendations  None recommended by PT    Recommendations for Other Services       Precautions / Restrictions Precautions Precautions: Knee Restrictions Weight Bearing Restrictions: No LLE Weight Bearing: Weight bearing as tolerated     Mobility  Bed Mobility Overal bed mobility: Needs Assistance Bed Mobility: Sit to Supine     Supine to sit: Min assist, HOB elevated Sit to supine: Min assist   General bed mobility comments: Small amount of assist for L LE. Increased time.    Transfers Overall transfer level: Needs assistance Equipment used: Rolling walker (2 wheels) Transfers: Sit to/from Stand Sit to Stand: Min guard           General transfer comment: Min guard for safety. Cues for safety, technique, hand/LE placement.     Ambulation/Gait Ambulation/Gait assistance: Min guard, Min assist Gait Distance (Feet): 15 Feet (x2) Assistive device: Rolling walker (2 wheels) Gait Pattern/deviations: Step-to pattern, Antalgic, Trunk flexed       General Gait Details: Cues for safety, technique, sequence.Pt continues to c/o dizziness, "spinning" at rest and with activity. BP 154/86. Pain rated 8/10 as well.   Stairs             Wheelchair Mobility    Modified Rankin (Stroke Patients Only)       Balance Overall balance assessment: Needs assistance         Standing balance support: Bilateral upper extremity supported, During functional activity, Reliant on assistive device for balance Standing balance-Leahy Scale: Poor                              Cognition Arousal/Alertness: Awake/alert Behavior During Therapy: WFL for tasks assessed/performed Overall Cognitive Status: Within Functional Limits for tasks assessed                                          Exercises Total Joint Exercises Ankle Circles/Pumps: AROM, Both, 10 reps Quad Sets: AROM, Both, 10 reps Heel Slides: AAROM, Left, 10 reps Hip ABduction/ADduction: AAROM, Left, 10 reps Goniometric ROM: ~10-65 degrees    General Comments        Pertinent Vitals/Pain Pain Assessment Pain Assessment: 0-10 Pain Score: 8  Pain Location: L knee/thigh Pain Descriptors / Indicators: Discomfort, Sore Pain Intervention(s): Monitored during session, Limited activity within patient's  tolerance, Ice applied, Repositioned    Home Living                          Prior Function            PT Goals (current goals can now be found in the care plan section) Acute Rehab PT Goals Patient Stated Goal: less pain. feel better PT Goal Formulation: With patient Time For Goal Achievement: 03/20/22 Potential to Achieve Goals: Good Progress towards PT goals: Progressing toward goals    Frequency     7X/week      PT Plan Current plan remains appropriate    Co-evaluation              AM-PAC PT "6 Clicks" Mobility   Outcome Measure  Help needed turning from your back to your side while in a flat bed without using bedrails?: A Little Help needed moving from lying on your back to sitting on the side of a flat bed without using bedrails?: A Little Help needed moving to and from a bed to a chair (including a wheelchair)?: A Little Help needed standing up from a chair using your arms (e.g., wheelchair or bedside chair)?: A Little Help needed to walk in hospital room?: A Little Help needed climbing 3-5 steps with a railing? : A Lot 6 Click Score: 17    End of Session Equipment Utilized During Treatment: Gait belt Activity Tolerance: Patient limited by pain (limited by dizziness) Patient left: in bed;with call bell/phone within reach   PT Visit Diagnosis: Other abnormalities of gait and mobility (R26.89);Pain Pain - Right/Left: Left Pain - part of body: Knee     Time: 3267-1245 PT Time Calculation (min) (ACUTE ONLY): 16 min  Charges:  $Gait Training: 8-22 mins                         Doreatha Massed, PT Acute Rehabilitation  Office: [phone] Pager: [phone]

## 2022-03-06 NOTE — Op Note (Signed)
NAME: Rhonda Patton, JAIN MEDICAL RECORD NO: [id number] ACCOUNT NO: [id number] DATE OF BIRTH: 1960/12/31 FACILITY: [facility]ATION: [id number] PHYSICIAN: Dione Plover. Koral Thaden, MD  Operative Report   DATE OF PROCEDURE: 03/05/2022  PREOPERATIVE DIAGNOSIS:  Failed left total knee arthroplasty.  POSTOPERATIVE DIAGNOSIS:  Failed left total knee arthroplasty.  PROCEDURE:  Left knee polyethylene revision.  SURGEON:  Dione Plover. Ethan Kasperski, MD  ASSISTANT:  Jaynie Bream, PA-C  ANESTHESIA:  Adductor canal block and spinal.  ESTIMATED BLOOD LOSS:  Minimal.  DRAINS:  None.  TOURNIQUET TIME:  50 minutes at 300 mmHg.  COMPLICATIONS:  None.  CONDITION:  Stable to recovery.  CLINICAL NOTE:  The patient is a 61 year old female who had a left total knee arthroplasty done several years ago.  She did well for the first year or two and then started having progressively worsening pain and instability symptoms.  I saw her in  consultation approximately 2 months ago.  She was noted to have an unstable total knee arthroplasty.  Plain radiographs showed no signs of loosening and bone scan was negative.  Lab workup showed no sign of infection that included an aspiration as well  as blood tests.  She was felt to have an unstable total knee, which is causing the symptoms that she experiences.  She presents now for polyethylene versus total knee arthroplasty revision.  PROCEDURE IN DETAIL:  After successful administration of adductor canal block and then spinal anesthetic, a tourniquet was placed over the left thigh and her left lower extremity is prepped and draped in the usual sterile fashion.  Extremity was wrapped  in Esmarch, knee flexed and the tourniquet inflated to 300 mmHg.  Exam under anesthesia shows a significant amount of varus, valgus laxity in extension as well as AP laxity in 90 degrees of flexion.  After the tourniquet was inflated, the incision was  made.  Skin cut with a 10 blade through subcutaneous  tissue to the level of the extensor mechanism.  Fresh blade was used to make a medial parapatellar arthrotomy.  Very small amount of bloody fluid in the joint.  The synovectomy is performed first  medially and then laterally.  She had a fair amount of inflamed synovial tissue in the medial and lateral gutters.  Again, she had a significant amount of instability to varus and valgus stressing.  I then flexed the knee 90 degrees.  The patella tracked  normally.  I then was able to evert the patella and we were able to remove the polyethylene from the tibial tray.  Of note, the polyethylene was not fully seated in the tibial tray, it did not lock in place, so it was essentially moving within the tray  and was not stationary and locked.  Thus, it was removed very easily.  There was a fair amount of wear on the underside of the polyethylene.  The tibial tray was in excellent position.  It was very well fixed.  The femoral components were also in  excellent position and is very well fixed.  The thickness of the polyethylene that was removed was 10 mm.  I placed a 12.5 mm tibial polyethylene posterior stabilized trial into the tray and reduced the knee.  She had great stability throughout full  range of motion.  She had full extension with excellent varus and valgus stability throughout full range of motion and excellent AP stability in flexion. Since the components were stable and well aligned, I felt that just changing the polyethylene was  the  best mechanism for her to get a normal recovery.  We thus placed a 12.5 mm fixed bearing posterior stabilized Sigma polyethylene into the tibial tray.  This is a Multimedia programmer product.  This locked into position and was fully seated.  I tested it with an  elevator and the component is locked into the tibial tray.  Again, full extension was achieved with excellent balance throughout full range of motion.  The joint was then thoroughly irrigated with saline solution with pulsatile  lavage.  20 mL of Exparel  mixed with 60 mL of saline injected into the extensor mechanism, subcutaneous tissue and periosteum of the femur.  The arthrotomy was then closed with a running 0 Stratafix suture.  Tourniquet was then released for a total time of 50 minutes.   Subcutaneous was then closed with interrupted 2-0 Vicryl and subcuticular running 4-0 Monocryl.  Incisions cleaned and dried and Steri-Strips and a sterile dressing applied.  She was then awakened and transported to recovery in stable condition.  Note that a surgical assistant was of medical necessity for this procedure to do it in a safe and expeditious manner.  Surgical assistant was necessary for retraction of vital ligaments and neurovascular structures and for proper positioning of the limb  for safe removal of the old implant and safe and accurate placement of the new one.   NIK D: 03/05/2022 2:52:16 pm T: 03/06/2022 2:45:00 am  JOB: [id number]/ [id number]

## 2022-03-06 NOTE — Evaluation (Signed)
Physical Therapy Evaluation Patient Details Name: Rhonda Patton MRN: [id number] DOB: 1961-07-23 Today's Date: 03/06/2022  History of Present Illness  61 yo female s/p L TK revision 5/17. Hx of L TKA 2018  Clinical Impression  On eval, pt required Min A for mobility. She only walked ~5 feet before becoming nauseous, sweaty, lightheaded. Pt began dry-heaving. Deferred further ambulation. Made RN aware. Will continue to follow and progress activity as tolerated.        Recommendations for follow up therapy are one component of a multi-disciplinary discharge planning process, led by the attending physician.  Recommendations may be updated based on patient status, additional functional criteria and insurance authorization.  Follow Up Recommendations Follow physician's recommendations for discharge plan and follow up therapies    Assistance Recommended at Discharge Intermittent Supervision/Assistance  Patient can return home with the following  A little help with walking and/or transfers;Assist for transportation;Help with stairs or ramp for entrance;A little help with bathing/dressing/bathroom    Equipment Recommendations None recommended by PT  Recommendations for Other Services       Functional Status Assessment Patient has had a recent decline in their functional status and demonstrates the ability to make significant improvements in function in a reasonable and predictable amount of time.     Precautions / Restrictions Precautions Precautions: Knee Restrictions Weight Bearing Restrictions: No LLE Weight Bearing: Weight bearing as tolerated      Mobility  Bed Mobility Overal bed mobility: Needs Assistance Bed Mobility: Supine to Sit     Supine to sit: Min assist, HOB elevated     General bed mobility comments: Small amount of assist for L LE off bed. Increased time.    Transfers Overall transfer level: Needs assistance Equipment used: Rolling walker (2  wheels) Transfers: Sit to/from Stand Sit to Stand: Min assist, From elevated surface           General transfer comment: Assist to rise, steady, control descent. Cues for safety, technique, hand/LE placement.    Ambulation/Gait Ambulation/Gait assistance: Min assist Gait Distance (Feet): 5 Feet Assistive device: Rolling walker (2 wheels) Gait Pattern/deviations: Step-to pattern       General Gait Details: Cues for safety, technique, sequence. After ~5 feet, pt c/o feeling hot/sweaty, nauseous, and dizzy. She began dry-heaving. Deferred further ambulation.  Stairs            Wheelchair Mobility    Modified Rankin (Stroke Patients Only)       Balance Overall balance assessment: Needs assistance         Standing balance support: Bilateral upper extremity supported, During functional activity, Reliant on assistive device for balance Standing balance-Leahy Scale: Poor                               Pertinent Vitals/Pain Pain Assessment Pain Assessment: 0-10 Pain Score: 7  Pain Location: L knee Pain Descriptors / Indicators: Discomfort, Sore Pain Intervention(s): Limited activity within patient's tolerance, Monitored during session, Repositioned, Ice applied    Home Living Family/patient expects to be discharged to:: Private residence Living Arrangements: Spouse/significant other;Children   Type of Home: House Home Access: Stairs to enter Entrance Stairs-Rails: Psychiatric nurse of Steps: 5   Home Layout: One level Home Equipment: Cane - single Barista (2 wheels)      Prior Function Prior Level of Function : Independent/Modified Independent             Mobility  Comments: using cane for ambulation       Hand Dominance        Extremity/Trunk Assessment   Upper Extremity Assessment Upper Extremity Assessment: Overall WFL for tasks assessed    Lower Extremity Assessment Lower Extremity Assessment:  Generalized weakness    Cervical / Trunk Assessment Cervical / Trunk Assessment: Normal  Communication   Communication: No difficulties  Cognition Arousal/Alertness: Awake/alert Behavior During Therapy: WFL for tasks assessed/performed Overall Cognitive Status: Within Functional Limits for tasks assessed                                          General Comments      Exercises Total Joint Exercises Ankle Circles/Pumps: AROM, Both, 10 reps Quad Sets: AROM, Both, 10 reps Heel Slides: AAROM, Left, 10 reps Hip ABduction/ADduction: AAROM, Left, 10 reps Goniometric ROM: ~10-65 degrees   Assessment/Plan    PT Assessment Patient needs continued PT services  PT Problem List Decreased strength;Decreased mobility;Decreased range of motion;Decreased activity tolerance;Decreased balance;Decreased knowledge of use of DME;Pain       PT Treatment Interventions DME instruction;Therapeutic activities;Gait training;Therapeutic exercise;Patient/family education;Balance training;Stair training;Functional mobility training    PT Goals (Current goals can be found in the Care Plan section)  Acute Rehab PT Goals Patient Stated Goal: less pain. feel better PT Goal Formulation: With patient Time For Goal Achievement: 03/20/22 Potential to Achieve Goals: Good    Frequency 7X/week     Co-evaluation               AM-PAC PT "6 Clicks" Mobility  Outcome Measure Help needed turning from your back to your side while in a flat bed without using bedrails?: A Little Help needed moving from lying on your back to sitting on the side of a flat bed without using bedrails?: A Little Help needed moving to and from a bed to a chair (including a wheelchair)?: A Little Help needed standing up from a chair using your arms (e.g., wheelchair or bedside chair)?: A Little Help needed to walk in hospital room?: A Lot Help needed climbing 3-5 steps with a railing? : A Lot 6 Click Score: 16     End of Session Equipment Utilized During Treatment: Gait belt Activity Tolerance: Patient limited by pain (limited by nausea) Patient left: in chair;with call bell/phone within reach   PT Visit Diagnosis: Other abnormalities of gait and mobility (R26.89);Pain Pain - Right/Left: Left Pain - part of body: Knee    Time: 0910-0939 PT Time Calculation (min) (ACUTE ONLY): 29 min   Charges:   PT Evaluation $PT Eval Low Complexity: 1 Low PT Treatments $Gait Training: 8-22 mins           Doreatha Massed, PT Acute Rehabilitation  Office: [phone] Pager: [phone]

## 2022-03-06 NOTE — Plan of Care (Signed)
  Problem: Activity: Goal: Risk for activity intolerance will decrease Outcome: Progressing   Problem: Nutrition: Goal: Adequate nutrition will be maintained Outcome: Progressing   Problem: Elimination: Goal: Will not experience complications related to bowel motility Outcome: Progressing   Problem: Safety: Goal: Ability to remain free from injury will improve Outcome: Progressing   Problem: Skin Integrity: Goal: Risk for impaired skin integrity will decrease Outcome: Progressing   Problem: Pain Managment: Goal: General experience of comfort will improve Outcome: Progressing

## 2022-03-06 NOTE — Progress Notes (Signed)
   Subjective: 1 Day Post-Op Procedure(s) (LRB): Left knee polyethylene revision (Left) Patient seen in rounds by Dr. Wynelle Link. Patient is well, and has had no acute complaints or problems. Foley cath removed this AM. Denies SOB or chest pain. Patient reports pain as mild.   We will start therapy today.  Objective: Vital signs in last 24 hours: Temp:  [97.8 F (36.6 C)-98.6 F (37 C)] 98 F (36.7 C) (05/18 0544) Pulse Rate:  [48-72] 59 (05/18 0544) Resp:  [14-24] 14 (05/18 0544) BP: (122-184)/(66-95) 122/68 (05/18 0544) SpO2:  [95 %-100 %] 97 % (05/18 0544) Weight:  [109.3 kg] 109.3 kg (05/17 1233)  Intake/Output from previous day:  Intake/Output Summary (Last 24 hours) at 03/06/2022 0714 Last data filed at 03/06/2022 0600 Gross per 24 hour  Intake 3844.25 ml  Output 2925 ml  Net 919.25 ml     Intake/Output this shift: No intake/output data recorded.  Labs: Recent Labs    03/06/22 0321  HGB 13.0   Recent Labs    03/06/22 0321  WBC 7.2  RBC 4.61  HCT 39.3  PLT 200   Recent Labs    03/06/22 0321  NA 140  K 3.8  CL 108  CO2 23  BUN 11  CREATININE 0.97  GLUCOSE 139*  CALCIUM 8.7*   No results for input(s): LABPT, INR in the last 72 hours.  Exam: General - Patient is Alert and Oriented Extremity - Neurologically intact Neurovascular intact Sensation intact distally Dorsiflexion/Plantar flexion intact Dressing - dressing C/D/I Motor Function - intact, moving foot and toes well on exam.  Past Medical History:  Diagnosis Date   Arthritis    Carpal tunnel syndrome of right wrist    Chronic pain    Diabetes (HCC)    GERD (gastroesophageal reflux disease)    Hypercholesteremia    Hypertension    PONV (postoperative nausea and vomiting)    Sleep apnea    diagnosed but doesn't use CPAP;     Assessment/Plan: 1 Day Post-Op Procedure(s) (LRB): Left knee polyethylene revision (Left) Principal Problem:   Failed total knee arthroplasty (HCC) Active  Problems:   Failed total left knee replacement (HCC)  Estimated body mass index is 42.69 kg/m as calculated from the following:   Height as of this encounter: '5\' 3"'$  (1.6 m).   Weight as of this encounter: 109.3 kg. Advance diet Up with therapy D/C IV fluids  Patient's anticipated LOS is less than 2 midnights, meeting these requirements: - Younger than 4 - Lives within 1 hour of care - Has a competent adult at home to recover with post-op - NO history of  - Chronic pain requiring opioids  - Diabetes  - Coronary Artery Disease  - Heart failure  - Heart attack  - Stroke  - DVT/VTE  - Cardiac arrhythmia  - Respiratory Failure/COPD  - Renal failure  - Anemia  - Advanced Liver disease  DVT Prophylaxis - Xarelto Weight bearing as tolerated. Start physical therapy.  Expected discharge today pending progress with physical therapy. Plan is to go Home after hospital stay. She will do outpatient physical therapy at [facility] in [place]. Follow-up in office in 2 weeks.  The PDMP database was reviewed today prior to any opioid medications being prescribed to this patient.  R. Jaynie Bream, PA-C Orthopedic Surgery [phone] 03/06/2022, 7:14 AM

## 2022-03-06 NOTE — TOC Transition Note (Signed)
Transition of Care [facility]) - CM/SW Discharge Note  Patient Details  Name: Rhonda Patton MRN: [id number] Date of Birth: 09/26/1961  [facility] CM/SW Contact:  Sherie Don, LCSW Phone Number: 03/06/2022, 10:54 AM  Clinical Narrative: Patient is expected to discharge home after working with PT. CSW met with patient to confirm discharge plan and needs. Patient will go home with OPPT at [facility] in [place]. Patient has a rolling walker at home, but will need a 3N1. [employer] delivered 3N1 to room. TOC signing off.  Final next level of care: OP Rehab Barriers to Discharge: No Barriers Identified  Patient Goals and CMS Choice Patient states their goals for this hospitalization and ongoing recovery are:: Discharge home with OPPT at [place] Medicare.gov Compare Post Acute Care list provided to:: Patient Choice offered to / list presented to : Patient  Discharge Plan and Services        DME Arranged: 3-N-1 DME Agency: [employer] Representative spoke with at DME Agency: Prearranged in orthopedist's office  Readmission Risk Interventions     View : No data to display.

## 2022-03-07 ENCOUNTER — Encounter (HOSPITAL_COMMUNITY): Payer: Self-pay | Admitting: Orthopedic Surgery

## 2022-03-07 ENCOUNTER — Other Ambulatory Visit (HOSPITAL_COMMUNITY): Payer: Self-pay

## 2022-03-07 LAB — CBC
HCT: 36 % (ref 36.0–46.0)
Hemoglobin: 12.2 g/dL (ref 12.0–15.0)
MCH: 29 pg (ref 26.0–34.0)
MCHC: 33.9 g/dL (ref 30.0–36.0)
MCV: 85.7 fL (ref 80.0–100.0)
Platelets: 171 10*3/uL (ref 150–400)
RBC: 4.2 MIL/uL (ref 3.87–5.11)
RDW: 14.4 % (ref 11.5–15.5)
WBC: 8.1 10*3/uL (ref 4.0–10.5)
nRBC: 0 % (ref 0.0–0.2)

## 2022-03-07 LAB — GLUCOSE, CAPILLARY
Glucose-Capillary: 112 mg/dL — ABNORMAL HIGH (ref 70–99)
Glucose-Capillary: 107 mg/dL — ABNORMAL HIGH (ref 70–99)
Glucose-Capillary: 117 mg/dL — ABNORMAL HIGH (ref 70–99)
Glucose-Capillary: 97 mg/dL (ref 70–99)
Glucose-Capillary: 134 mg/dL — ABNORMAL HIGH (ref 70–99)

## 2022-03-07 MED ORDER — HYDROMORPHONE HCL 2 MG PO TABS
2.0000 mg | ORAL_TABLET | Freq: Four times a day (QID) | ORAL | 0 refills | Status: DC | PRN
  Filled 2022-03-07: qty 42, 11d supply, fill #0

## 2022-03-07 NOTE — Progress Notes (Signed)
   Subjective: 2 Days Post-Op Procedure(s) (LRB): Left knee polyethylene revision (Left) Patient seen in rounds by Dr. Wynelle Link. Patient reports pain as mild.   Patient is well. Had episodes of dry heaving and dizziness yesterday when working with physical therapy. Denies N/V. Dizziness improving. Plan is to go Home after hospital stay.  Objective: Vital signs in last 24 hours: Temp:  [97.9 F (36.6 C)-99.1 F (37.3 C)] 99.1 F (37.3 C) (05/19 0701) Pulse Rate:  [60-77] 77 (05/19 0701) Resp:  [18-20] 20 (05/19 0701) BP: (109-121)/(58-96) 121/68 (05/19 0701) SpO2:  [94 %-100 %] 94 % (05/19 0701)  Intake/Output from previous day:  Intake/Output Summary (Last 24 hours) at 03/07/2022 0809 Last data filed at 03/07/2022 0518 Gross per 24 hour  Intake 843.58 ml  Output 1400 ml  Net -556.42 ml    Intake/Output this shift: No intake/output data recorded.  Labs: Recent Labs    03/06/22 0321 03/07/22 0333  HGB 13.0 12.2   Recent Labs    03/06/22 0321 03/07/22 0333  WBC 7.2 8.1  RBC 4.61 4.20  HCT 39.3 36.0  PLT 200 171   Recent Labs    03/06/22 0321  NA 140  K 3.8  CL 108  CO2 23  BUN 11  CREATININE 0.97  GLUCOSE 139*  CALCIUM 8.7*   No results for input(s): LABPT, INR in the last 72 hours.  Exam: General - Patient is Alert and Oriented Extremity - Neurologically intact Neurovascular intact Sensation intact distally Dorsiflexion/Plantar flexion intact Dressing/Incision - clean, dry, no drainage Motor Function - intact, moving foot and toes well on exam.   Past Medical History:  Diagnosis Date   Arthritis    Carpal tunnel syndrome of right wrist    Chronic pain    Diabetes (HCC)    GERD (gastroesophageal reflux disease)    Hypercholesteremia    Hypertension    PONV (postoperative nausea and vomiting)    Sleep apnea    diagnosed but doesn't use CPAP;     Assessment/Plan: 2 Days Post-Op Procedure(s) (LRB): Left knee polyethylene revision  (Left) Principal Problem:   Failed total knee arthroplasty (HCC) Active Problems:   Failed total left knee replacement (HCC)  Estimated body mass index is 42.69 kg/m as calculated from the following:   Height as of this encounter: '5\' 3"'$  (1.6 m).   Weight as of this encounter: 109.3 kg. Advance diet Up with therapy  DVT Prophylaxis - Xarelto Weight-bearing as tolerated.  Medication switched from oxycodone to dilaudid due to N/V and dizziness which has improved symptoms. Continue to monitor. Expected discharge today pending progress with physical therapy. She will do outpatient physical therapy at [facility] in [place]. Follow-up in office in 2 weeks.  Rainey Pines, PA-C Orthopedic Surgery [phone] 03/07/2022, 8:09 AM

## 2022-03-07 NOTE — Plan of Care (Signed)
  Problem: Education: Goal: Knowledge of General Education information will improve Description: Including pain rating scale, medication(s)/side effects and non-pharmacologic comfort measures Outcome: Progressing   Problem: Clinical Measurements: Goal: Will remain free from infection Outcome: Progressing   Problem: Activity: Goal: Risk for activity intolerance will decrease Outcome: Progressing   Problem: Nutrition: Goal: Adequate nutrition will be maintained Outcome: Progressing   Problem: Elimination: Goal: Will not experience complications related to bowel motility Outcome: Progressing Goal: Will not experience complications related to urinary retention Outcome: Progressing   Problem: Pain Managment: Goal: General experience of comfort will improve Outcome: Progressing   Problem: Safety: Goal: Ability to remain free from injury will improve Outcome: Progressing   Problem: Skin Integrity: Goal: Risk for impaired skin integrity will decrease Outcome: Progressing

## 2022-03-07 NOTE — Progress Notes (Signed)
Physical Therapy Treatment Patient Details Name: Rhonda Patton MRN: [id number] DOB: 22-Sep-1961 Today's Date: 03/07/2022   History of Present Illness 61 yo female s/p L TK revision 5/17. Hx of L TKA 2018    PT Comments    Patient remains limited by nausea and emesis this session and c/o cramps in abdomen. She ambulated short distance to bathroom and c/o dizziness. Chair was brought close for stand step/pivot transfer for pt to transfer back from toilet, Patient transferred back to bed at EOS and encouraged to complete ankle pumps. Will continue to progress pt as able to progress to safe level for discharge home.    Recommendations for follow up therapy are one component of a multi-disciplinary discharge planning process, led by the attending physician.  Recommendations may be updated based on patient status, additional functional criteria and insurance authorization.  Follow Up Recommendations  Follow physician's recommendations for discharge plan and follow up therapies     Assistance Recommended at Discharge Intermittent Supervision/Assistance  Patient can return home with the following A little help with walking and/or transfers;Assist for transportation;Help with stairs or ramp for entrance;A little help with bathing/dressing/bathroom   Equipment Recommendations  None recommended by PT    Recommendations for Other Services       Precautions / Restrictions Precautions Precautions: Knee Restrictions Weight Bearing Restrictions: No LLE Weight Bearing: Weight bearing as tolerated     Mobility  Bed Mobility Overal bed mobility: Needs Assistance Bed Mobility: Sit to Supine     Supine to sit: Min guard, HOB elevated Sit to supine: Min assist   General bed mobility comments: light assist to bring Lt LE onto bed at EOS.    Transfers Overall transfer level: Needs assistance Equipment used: Rolling walker (2 wheels) Transfers: Sit to/from Stand, Bed to  chair/wheelchair/BSC Sit to Stand: Min assist   Step pivot transfers: Min assist       General transfer comment: guarding for first power up from recliner. Min assist to rise from toilet and chair at EOS due to weakness and increased nausea. Assist to pivot RW to move back to bed at EOS.    Ambulation/Gait Ambulation/Gait assistance: Min guard, Min assist Gait Distance (Feet): 15 Feet Assistive device: Rolling walker (2 wheels) Gait Pattern/deviations: Step-to pattern, Antalgic, Trunk flexed Gait velocity: decr     General Gait Details: pt ambulated short distance to batroom with min assist due to c/o nausea.pt expereienced bout of emesis and chair brought close and pt required min assist to stand and pivot to turn and step back to recliner.   Stairs             Wheelchair Mobility    Modified Rankin (Stroke Patients Only)       Balance Overall balance assessment: Needs assistance Sitting-balance support: Feet supported Sitting balance-Leahy Scale: Good     Standing balance support: Bilateral upper extremity supported, During functional activity, Reliant on assistive device for balance Standing balance-Leahy Scale: Poor                              Cognition Arousal/Alertness: Awake/alert Behavior During Therapy: WFL for tasks assessed/performed Overall Cognitive Status: Within Functional Limits for tasks assessed                                          Exercises  Total Joint Exercises Ankle Circles/Pumps: AROM, Both, 15 reps Quad Sets: AROM, 10 reps, Left Heel Slides: AAROM, Left, 10 reps Hip ABduction/ADduction: AAROM, Left, 10 reps    General Comments General comments (skin integrity, edema, etc.): pt noted slight bleeding from perianal area after using commode. and c/o cramping.  RN notified..      Pertinent Vitals/Pain Pain Assessment Pain Assessment: Faces Pain Score: 5  Faces Pain Scale: Hurts even more Pain  Location: L knee/thigh Pain Descriptors / Indicators: Discomfort, Sore Pain Intervention(s): Limited activity within patient's tolerance, Monitored during session, Repositioned    Home Living                          Prior Function            PT Goals (current goals can now be found in the care plan section) Acute Rehab PT Goals PT Goal Formulation: With patient Time For Goal Achievement: 03/20/22 Potential to Achieve Goals: Good Progress towards PT goals: Progressing toward goals (limited by nausea)    Frequency    7X/week      PT Plan Current plan remains appropriate    Co-evaluation              AM-PAC PT "6 Clicks" Mobility   Outcome Measure  Help needed turning from your back to your side while in a flat bed without using bedrails?: A Little Help needed moving from lying on your back to sitting on the side of a flat bed without using bedrails?: A Little Help needed moving to and from a bed to a chair (including a wheelchair)?: A Little Help needed standing up from a chair using your arms (e.g., wheelchair or bedside chair)?: A Little Help needed to walk in hospital room?: A Little Help needed climbing 3-5 steps with a railing? : A Lot 6 Click Score: 17    End of Session Equipment Utilized During Treatment: Gait belt Activity Tolerance: Patient tolerated treatment well Patient left: with call bell/phone within reach;in bed;with bed alarm set Nurse Communication: Mobility status PT Visit Diagnosis: Other abnormalities of gait and mobility (R26.89);Pain Pain - Right/Left: Left Pain - part of body: Knee     Time: 4270-6237 PT Time Calculation (min) (ACUTE ONLY): 25 min  Charges:  $Gait Training: 8-22 mins $Therapeutic Activity: 8-22 mins                     Verner Mould, DPT Acute Rehabilitation Services Office [phone] Pager [phone]  03/07/22 3:24 PM

## 2022-03-07 NOTE — Progress Notes (Signed)
Physical Therapy Treatment Patient Details Name: Rhonda Patton MRN: [id number] DOB: 1961/03/26 Today's Date: 03/07/2022   History of Present Illness 61 yo female s/p L TK revision 5/17. Hx of L TKA 2018    PT Comments    Patient making progress with mobility today and was able to ambulated ~50' with RW and no increase in nausea or dizziness. No overt LOB noted or buckling at Lt knee. EOS reviewed exercises for ROM and strength and discussed plan for stair training this afternoon if pt's pain and dizziness is improved. Will progress as able for safe discharge home.    Recommendations for follow up therapy are one component of a multi-disciplinary discharge planning process, led by the attending physician.  Recommendations may be updated based on patient status, additional functional criteria and insurance authorization.  Follow Up Recommendations  Follow physician's recommendations for discharge plan and follow up therapies     Assistance Recommended at Discharge Intermittent Supervision/Assistance  Patient can return home with the following A little help with walking and/or transfers;Assist for transportation;Help with stairs or ramp for entrance;A little help with bathing/dressing/bathroom   Equipment Recommendations  None recommended by PT    Recommendations for Other Services       Precautions / Restrictions Precautions Precautions: Knee Restrictions Weight Bearing Restrictions: Yes LLE Weight Bearing: Weight bearing as tolerated     Mobility  Bed Mobility Overal bed mobility: Needs Assistance Bed Mobility: Supine to Sit     Supine to sit: Min guard, HOB elevated     General bed mobility comments: guarding for safety, pt able to bring LE off EOB with use of belt.    Transfers Overall transfer level: Needs assistance Equipment used: Rolling walker (2 wheels) Transfers: Sit to/from Stand Sit to Stand: Min assist           General transfer comment: cues  for hand placement for power up from EOB, no assist required.    Ambulation/Gait Ambulation/Gait assistance: Min guard, Min assist Gait Distance (Feet): 50 Feet Assistive device: Rolling walker (2 wheels) Gait Pattern/deviations: Step-to pattern, Antalgic, Trunk flexed Gait velocity: decr     General Gait Details: Cues for safety, technique, sequence. pt with good step to pattern and maintained safe proximity to RW. denied increased dizziness or nausea with mobiltiy.   Stairs             Wheelchair Mobility    Modified Rankin (Stroke Patients Only)       Balance Overall balance assessment: Needs assistance Sitting-balance support: Feet supported Sitting balance-Leahy Scale: Good     Standing balance support: Bilateral upper extremity supported, During functional activity, Reliant on assistive device for balance Standing balance-Leahy Scale: Poor                              Cognition Arousal/Alertness: Awake/alert Behavior During Therapy: WFL for tasks assessed/performed Overall Cognitive Status: Within Functional Limits for tasks assessed                                          Exercises Total Joint Exercises Ankle Circles/Pumps: AROM, Both, 15 reps Quad Sets: AROM, 10 reps, Left Heel Slides: AAROM, Left, 10 reps Hip ABduction/ADduction: AAROM, Left, 10 reps    General Comments        Pertinent Vitals/Pain Pain Assessment Pain Assessment: 0-10  Pain Score: 5  Pain Location: L knee/thigh Pain Descriptors / Indicators: Discomfort, Sore Pain Intervention(s): Limited activity within patient's tolerance, Monitored during session, Repositioned    Home Living                          Prior Function            PT Goals (current goals can now be found in the care plan section) Acute Rehab PT Goals PT Goal Formulation: With patient Time For Goal Achievement: 03/20/22 Potential to Achieve Goals: Good Progress  towards PT goals: Progressing toward goals    Frequency    7X/week      PT Plan Current plan remains appropriate    Co-evaluation              AM-PAC PT "6 Clicks" Mobility   Outcome Measure  Help needed turning from your back to your side while in a flat bed without using bedrails?: A Little Help needed moving from lying on your back to sitting on the side of a flat bed without using bedrails?: A Little Help needed moving to and from a bed to a chair (including a wheelchair)?: A Little Help needed standing up from a chair using your arms (e.g., wheelchair or bedside chair)?: A Little Help needed to walk in hospital room?: A Little Help needed climbing 3-5 steps with a railing? : A Lot 6 Click Score: 17    End of Session Equipment Utilized During Treatment: Gait belt Activity Tolerance: Patient tolerated treatment well Patient left: in chair;with call bell/phone within reach Nurse Communication: Mobility status PT Visit Diagnosis: Other abnormalities of gait and mobility (R26.89);Pain Pain - Right/Left: Left Pain - part of body: Knee     Time: 7741-2878 PT Time Calculation (min) (ACUTE ONLY): 28 min  Charges:  $Gait Training: 8-22 mins $Therapeutic Exercise: 8-22 mins                     Verner Mould, DPT Acute Rehabilitation Services Office [phone] Pager [phone]  03/07/22 11:55 AM

## 2022-03-08 ENCOUNTER — Other Ambulatory Visit (HOSPITAL_COMMUNITY): Payer: Self-pay

## 2022-03-08 LAB — GLUCOSE, CAPILLARY
Glucose-Capillary: 156 mg/dL — ABNORMAL HIGH (ref 70–99)
Glucose-Capillary: 100 mg/dL — ABNORMAL HIGH (ref 70–99)

## 2022-03-08 MED ORDER — ONDANSETRON HCL 4 MG PO TABS
4.0000 mg | ORAL_TABLET | Freq: Four times a day (QID) | ORAL | 0 refills | Status: DC | PRN
  Filled 2022-03-08: qty 20, 5d supply, fill #0

## 2022-03-08 MED ORDER — SODIUM CHLORIDE 0.9 % IV BOLUS
250.0000 mL | Freq: Once | INTRAVENOUS | Status: AC
  Administered 2022-03-08: 250 mL via INTRAVENOUS

## 2022-03-08 NOTE — Progress Notes (Addendum)
Physical Therapy Treatment Patient Details Name: Rhonda Patton MRN: [id number] DOB: 03-20-1961 Today's Date: 03/08/2022   History of Present Illness 61 yo female s/p L TK revision 5/17. Hx of L TKA 2018    PT Comments    Patient making excellent progress with mobility today and no c/o nausea and dizziness this session. Patient ambulated ~120' with min guard and demonstrated good carryover for safe management of RW. Initiated stair training with side step technique and single rail. No buckling at Lt knee and no LOB. EOS reviewed exercises for ROM and circulation. Will follow up to review remaining exercises for HEP stair mobility for safe discharge home.    Recommendations for follow up therapy are one component of a multi-disciplinary discharge planning process, led by the attending physician.  Recommendations may be updated based on patient status, additional functional criteria and insurance authorization.  Follow Up Recommendations  Follow physician's recommendations for discharge plan and follow up therapies     Assistance Recommended at Discharge Intermittent Supervision/Assistance  Patient can return home with the following A little help with walking and/or transfers;Assist for transportation;Help with stairs or ramp for entrance;A little help with bathing/dressing/bathroom   Equipment Recommendations  None recommended by PT    Recommendations for Other Services       Precautions / Restrictions Precautions Precautions: Knee Restrictions Weight Bearing Restrictions: No LLE Weight Bearing: Weight bearing as tolerated     Mobility  Bed Mobility Overal bed mobility: Needs Assistance Bed Mobility: Supine to Sit     Supine to sit: Min guard, HOB elevated     General bed mobility comments: cues to use belt for Lt LE off EOB, guarding for safety and pt taking extra time    Transfers Overall transfer level: Needs assistance Equipment used: Rolling walker (2  wheels) Transfers: Sit to/from Stand Sit to Stand: Min guard           General transfer comment: pt using bil UE for power up and extra effort to rise. guarding for safety, no assist required.    Ambulation/Gait Ambulation/Gait assistance: Min guard, Min assist Gait Distance (Feet): 120 Feet Assistive device: Rolling walker (2 wheels) Gait Pattern/deviations: Step-to pattern, Antalgic, Trunk flexed Gait velocity: decr     General Gait Details: pt ambulated greater distance with no LOB noted and good carryover for safe position of RW. No c/o nausea or dizziness.  Stairs  Stairs: Yes Stairs assistance: Min guard, min assist Stair Management: One rail Left, Step to pattern, Sideways Number of Stairs: 2 General stair comments: min guard with cues for step pattern "up with good, down with bad" and assist for walker management.   Wheelchair Mobility    Modified Rankin (Stroke Patients Only)       Balance Overall balance assessment: Needs assistance Sitting-balance support: Feet supported Sitting balance-Leahy Scale: Good     Standing balance support: Bilateral upper extremity supported, During functional activity, Reliant on assistive device for balance Standing balance-Leahy Scale: Poor                              Cognition Arousal/Alertness: Awake/alert Behavior During Therapy: WFL for tasks assessed/performed Overall Cognitive Status: Within Functional Limits for tasks assessed  Exercises Total Joint Exercises Ankle Circles/Pumps: AROM, Both, 10 reps Quad Sets: AROM, Left, 5 reps Short Arc Quad: AROM, Left, 5 reps Heel Slides: AROM, Left, 5 reps Hip ABduction/ADduction: AROM, Left, 5 reps    General Comments        Pertinent Vitals/Pain Pain Assessment Pain Assessment: Faces Faces Pain Scale: Hurts little more Pain Location: L knee/thigh Pain Descriptors / Indicators: Discomfort,  Sore Pain Intervention(s): Limited activity within patient's tolerance, Monitored during session, Repositioned    Home Living                          Prior Function            PT Goals (current goals can now be found in the care plan section) Acute Rehab PT Goals PT Goal Formulation: With patient Time For Goal Achievement: 03/20/22 Potential to Achieve Goals: Good Progress towards PT goals: Progressing toward goals    Frequency    7X/week      PT Plan Current plan remains appropriate    Co-evaluation              AM-PAC PT "6 Clicks" Mobility   Outcome Measure  Help needed turning from your back to your side while in a flat bed without using bedrails?: A Little Help needed moving from lying on your back to sitting on the side of a flat bed without using bedrails?: A Little Help needed moving to and from a bed to a chair (including a wheelchair)?: A Little Help needed standing up from a chair using your arms (e.g., wheelchair or bedside chair)?: A Little Help needed to walk in hospital room?: A Little Help needed climbing 3-5 steps with a railing? : A Lot 6 Click Score: 17    End of Session Equipment Utilized During Treatment: Gait belt Activity Tolerance: Patient tolerated treatment well Patient left: with call bell/phone within reach;in bed;with bed alarm set Nurse Communication: Mobility status PT Visit Diagnosis: Other abnormalities of gait and mobility (R26.89);Pain Pain - Right/Left: Left Pain - part of body: Knee     Time: 5366-4403 PT Time Calculation (min) (ACUTE ONLY): 23 min  Charges:  $Gait Training: 8-22 mins $Therapeutic Exercise: 8-22 mins                     Verner Mould, DPT Acute Rehabilitation Services Office [phone] Pager [phone]  03/08/22 1:30 PM

## 2022-03-08 NOTE — Progress Notes (Signed)
   Subjective: 3 Days Post-Op Procedure(s) (LRB): Left knee polyethylene revision (Left) Patient reports pain as mild.   Patient seen in rounds for Dr. Wynelle Link. Patient is well, and has had no acute complaints or problems. No acute events overnight. She was limited yesterday by nausea and vomiting. She tells me she has been up to the bathroom this morning without N/V.  We will continue therapy today.   Objective: Vital signs in last 24 hours: Temp:  [98.6 F (37 C)-98.9 F (37.2 C)] 98.6 F (37 C) (05/20 0545) Pulse Rate:  [86-89] 86 (05/20 0545) Resp:  [18] 18 (05/20 0545) BP: (121-127)/(64-72) 127/72 (05/20 0545) SpO2:  [98 %] 98 % (05/20 0545)  Intake/Output from previous day:  Intake/Output Summary (Last 24 hours) at 03/08/2022 1100 Last data filed at 03/08/2022 0600 Gross per 24 hour  Intake 960 ml  Output 0 ml  Net 960 ml     Intake/Output this shift: No intake/output data recorded.  Labs: Recent Labs    03/06/22 0321 03/07/22 0333  HGB 13.0 12.2   Recent Labs    03/06/22 0321 03/07/22 0333  WBC 7.2 8.1  RBC 4.61 4.20  HCT 39.3 36.0  PLT 200 171   Recent Labs    03/06/22 0321  NA 140  K 3.8  CL 108  CO2 23  BUN 11  CREATININE 0.97  GLUCOSE 139*  CALCIUM 8.7*   No results for input(s): LABPT, INR in the last 72 hours.  Exam: General - Patient is Alert and Oriented Extremity - Neurologically intact Sensation intact distally Intact pulses distally Dorsiflexion/Plantar flexion intact Dressing - dressing C/D/I Motor Function - intact, moving foot and toes well on exam.   Past Medical History:  Diagnosis Date   Arthritis    Carpal tunnel syndrome of right wrist    Chronic pain    Diabetes (HCC)    GERD (gastroesophageal reflux disease)    Hypercholesteremia    Hypertension    PONV (postoperative nausea and vomiting)    Sleep apnea    diagnosed but doesn't use CPAP;     Assessment/Plan: 3 Days Post-Op Procedure(s) (LRB): Left knee  polyethylene revision (Left) Principal Problem:   Failed total knee arthroplasty (HCC) Active Problems:   Failed total left knee replacement (HCC)  Estimated body mass index is 42.69 kg/m as calculated from the following:   Height as of this encounter: '5\' 3"'$  (1.6 m).   Weight as of this encounter: 109.3 kg. Advance diet Up with therapy   DVT Prophylaxis -  Eliquis Weight bearing as tolerated.  Plan is to go Home after hospital stay. Plan for possible discharge today following 1-2 sessions of PT if she is doing well with PT. Will send with nausea medicine.   Patient is scheduled for OPPT. Follow up in the office in 2 weeks.   Griffith Citron, PA-C Orthopedic Surgery [phone] 03/08/2022, 11:00 AM

## 2022-03-08 NOTE — Progress Notes (Signed)
Physical Therapy Treatment Patient Details Name: Rhonda Patton MRN: [id number] DOB: 04-26-1961 Today's Date: 03/08/2022   History of Present Illness 61 yo female s/p L TK revision 5/17. Hx of L TKA 2018    PT Comments    Patient continues to demonstrate good carryover for safe mobility and management of RW. PT completed stairs with good recall and cues for safe family guarding. EOS pt was able to wash up in sitting and don clothing with min assist. Reviewed remaining exercises in HEP and addressed questions for discharge. Pt is at safe level for return home with assist from family.     Recommendations for follow up therapy are one component of a multi-disciplinary discharge planning process, led by the attending physician.  Recommendations may be updated based on patient status, additional functional criteria and insurance authorization.  Follow Up Recommendations  Follow physician's recommendations for discharge plan and follow up therapies     Assistance Recommended at Discharge Intermittent Supervision/Assistance  Patient can return home with the following A little help with walking and/or transfers;Assist for transportation;Help with stairs or ramp for entrance;A little help with bathing/dressing/bathroom   Equipment Recommendations  None recommended by PT    Recommendations for Other Services       Precautions / Restrictions Precautions Precautions: Knee Restrictions Weight Bearing Restrictions: No LLE Weight Bearing: Weight bearing as tolerated     Mobility  Bed Mobility Overal bed mobility: Needs Assistance Bed Mobility: Supine to Sit     Supine to sit: Min guard, HOB elevated     General bed mobility comments: pt OOB in recliner    Transfers Overall transfer level: Needs assistance Equipment used: Rolling walker (2 wheels) Transfers: Sit to/from Stand Sit to Stand: Supervision           General transfer comment: sueprvision to rise from recliner  and commode, no cues or assist needed.    Ambulation/Gait Ambulation/Gait assistance: Min guard, Supervision Gait Distance (Feet): 50 Feet Assistive device: Rolling walker (2 wheels) Gait Pattern/deviations: Step-through pattern, Decreased weight shift to left Gait velocity: decr     General Gait Details: pt ambulated to hall and completed stair training no LOB noted throughout. good management of RW. pt amb into bathroom and then back to recliner, negotiated doorways safely with RW.   Stairs Stairs: Yes Stairs assistance: Min guard, Supervision Stair Management: One rail Left, Step to pattern, Sideways Number of Stairs: 3 General stair comments: VC's for sequencing at start, pt completed with min guard/superrvision and verbalized safe guarding for family to provide.   Wheelchair Mobility    Modified Rankin (Stroke Patients Only)       Balance Overall balance assessment: Needs assistance Sitting-balance support: Feet supported Sitting balance-Leahy Scale: Good Sitting balance - Comments: pt able to wash up sitting in bathroom   Standing balance support: Bilateral upper extremity supported, During functional activity, Reliant on assistive device for balance Standing balance-Leahy Scale: Fair Standing balance comment: pt able to complete lower body dressing to pull up bottoms with no external support                            Cognition Arousal/Alertness: Awake/alert Behavior During Therapy: WFL for tasks assessed/performed Overall Cognitive Status: Within Functional Limits for tasks assessed  Exercises Total Joint Exercises Ankle Circles/Pumps: AROM, Both, 10 reps Quad Sets: AROM, Left, 5 reps Short Arc Quad: AROM, Left, 5 reps Heel Slides: AROM, Left, 5 reps Hip ABduction/ADduction: AROM, Left, 5 reps Long Arc Quad: AROM, Left, Other reps (comment) (3) Knee Flexion: AROM, AAROM, Other reps  (comment) (2)    General Comments        Pertinent Vitals/Pain Pain Assessment Pain Assessment: Faces Faces Pain Scale: Hurts little more Pain Location: L knee/thigh Pain Descriptors / Indicators: Discomfort, Sore Pain Intervention(s): Limited activity within patient's tolerance, Monitored during session, Repositioned    Home Living                          Prior Function            PT Goals (current goals can now be found in the care plan section) Acute Rehab PT Goals PT Goal Formulation: With patient Time For Goal Achievement: 03/20/22 Potential to Achieve Goals: Good Progress towards PT goals: Progressing toward goals    Frequency    7X/week      PT Plan Current plan remains appropriate    Co-evaluation              AM-PAC PT "6 Clicks" Mobility   Outcome Measure  Help needed turning from your back to your side while in a flat bed without using bedrails?: A Little Help needed moving from lying on your back to sitting on the side of a flat bed without using bedrails?: A Little Help needed moving to and from a bed to a chair (including a wheelchair)?: A Little Help needed standing up from a chair using your arms (e.g., wheelchair or bedside chair)?: A Little Help needed to walk in hospital room?: A Little Help needed climbing 3-5 steps with a railing? : A Little 6 Click Score: 18    End of Session Equipment Utilized During Treatment: Gait belt Activity Tolerance: Patient tolerated treatment well Patient left: with call bell/phone within reach;in bed;with bed alarm set Nurse Communication: Mobility status PT Visit Diagnosis: Other abnormalities of gait and mobility (R26.89);Pain Pain - Right/Left: Left Pain - part of body: Knee     Time: 1245-1310 PT Time Calculation (min) (ACUTE ONLY): 25 min  Charges:  $Gait Training: 8-22 mins $Therapeutic Activity: 8-22 mins                     Verner Mould, DPT Acute Rehabilitation  Services Office [phone] Pager [phone]  03/08/22 2:07 PM

## 2022-03-08 NOTE — Progress Notes (Signed)
RN reviewed discharge instructions with patient. All questions answered.   Paperwork given. Prescriptions delivered from [facility].   NT rolled patient down with all belongings to family car.    Benedetto Goad, RN

## 2022-03-10 NOTE — Discharge Summary (Signed)
Physician Discharge Summary   Patient ID: Rhonda Patton MRN: [id number] DOB/AGE: June 02, 1961 61 y.o.  Admit date: 03/05/2022 Discharge date: 03/08/2022  Primary Diagnosis: Failed left total knee arthroplasty   Admission Diagnoses:  Past Medical History:  Diagnosis Date   Arthritis    Carpal tunnel syndrome of right wrist    Chronic pain    Diabetes (HCC)    GERD (gastroesophageal reflux disease)    Hypercholesteremia    Hypertension    PONV (postoperative nausea and vomiting)    Sleep apnea    diagnosed but doesn't use CPAP;    Discharge Diagnoses:   Principal Problem:   Failed total knee arthroplasty ([place]) Active Problems:   Failed total left knee replacement ([place])  Estimated body mass index is 42.69 kg/m as calculated from the following:   Height as of this encounter: '5\' 3"'$  (1.6 m).   Weight as of this encounter: 109.3 kg.  Procedure:  Procedure(s) (LRB): Left knee polyethylene revision (Left)   Consults: None  HPI: The patient is a 61 year old female who had a left total knee arthroplasty done several years ago.  She did well for the first year or two and then started having progressively worsening pain and instability symptoms.  I saw her in consultation approximately 2 months ago.  She was noted to have an unstable total knee arthroplasty.  Plain radiographs showed no signs of loosening and bone scan was negative.  Lab workup showed no sign of infection that included an aspiration as well  as blood tests.  She was felt to have an unstable total knee, which is causing the symptoms that she experiences.  She presents now for polyethylene versus total knee arthroplasty revision.  Laboratory Data: Admission on 03/05/2022, Discharged on 03/08/2022  Component Date Value Ref Range Status   Glucose-Capillary 03/05/2022 121 (H)  70 - 99 mg/dL Final   Glucose reference range applies only to samples taken after fasting for at least 8 hours.   Glucose-Capillary 03/05/2022  88  70 - 99 mg/dL Final   Glucose reference range applies only to samples taken after fasting for at least 8 hours.   Glucose-Capillary 03/05/2022 99  70 - 99 mg/dL Final   Glucose reference range applies only to samples taken after fasting for at least 8 hours.   WBC 03/06/2022 7.2  4.0 - 10.5 K/uL Final   RBC 03/06/2022 4.61  3.87 - 5.11 MIL/uL Final   Hemoglobin 03/06/2022 13.0  12.0 - 15.0 g/dL Final   HCT 03/06/2022 39.3  36.0 - 46.0 % Final   MCV 03/06/2022 85.2  80.0 - 100.0 fL Final   MCH 03/06/2022 28.2  26.0 - 34.0 pg Final   MCHC 03/06/2022 33.1  30.0 - 36.0 g/dL Final   RDW 03/06/2022 14.2  11.5 - 15.5 % Final   Platelets 03/06/2022 200  150 - 400 K/uL Final   nRBC 03/06/2022 0.0  0.0 - 0.2 % Final   Performed at [facility], [place] [address] [place], [place] [address]   Sodium 03/06/2022 140  135 - 145 mmol/L Final   Potassium 03/06/2022 3.8  3.5 - 5.1 mmol/L Final   Chloride 03/06/2022 108  98 - 111 mmol/L Final   CO2 03/06/2022 23  22 - 32 mmol/L Final   Glucose, Bld 03/06/2022 139 (H)  70 - 99 mg/dL Final   Glucose reference range applies only to samples taken after fasting for at least 8 hours.   BUN 03/06/2022 11  8 - 23 mg/dL Final   Creatinine, Ser 03/06/2022 0.97  0.44 - 1.00 mg/dL Final   Calcium 03/06/2022 8.7 (L)  8.9 - 10.3 mg/dL Final   GFR, Estimated 03/06/2022 >60  >60 mL/min Final   Comment: (NOTE) Calculated using the CKD-EPI Creatinine Equation (2021)    Anion gap 03/06/2022 9  5 - 15 Final   Performed at [facility], [place] [address] [place], [place] [address]   Glucose-Capillary 03/05/2022 140 (H)  70 - 99 mg/dL Final   Glucose reference range applies only to samples taken after fasting for at least 8 hours.   Glucose-Capillary 03/06/2022 127 (H)  70 - 99 mg/dL Final   Glucose reference range applies only to samples taken after fasting for at least 8 hours.   Glucose-Capillary 03/06/2022 124 (H)  70 - 99 mg/dL  Final   Glucose reference range applies only to samples taken after fasting for at least 8 hours.   WBC 03/07/2022 8.1  4.0 - 10.5 K/uL Final   RBC 03/07/2022 4.20  3.87 - 5.11 MIL/uL Final   Hemoglobin 03/07/2022 12.2  12.0 - 15.0 g/dL Final   HCT 03/07/2022 36.0  36.0 - 46.0 % Final   MCV 03/07/2022 85.7  80.0 - 100.0 fL Final   MCH 03/07/2022 29.0  26.0 - 34.0 pg Final   MCHC 03/07/2022 33.9  30.0 - 36.0 g/dL Final   RDW 03/07/2022 14.4  11.5 - 15.5 % Final   Platelets 03/07/2022 171  150 - 400 K/uL Final   nRBC 03/07/2022 0.0  0.0 - 0.2 % Final   Performed at [facility], [place] [address], [place], [place] [address]   Glucose-Capillary 03/06/2022 169 (H)  70 - 99 mg/dL Final   Glucose reference range applies only to samples taken after fasting for at least 8 hours.   Glucose-Capillary 03/06/2022 112 (H)  70 - 99 mg/dL Final   Glucose reference range applies only to samples taken after fasting for at least 8 hours.   Glucose-Capillary 03/07/2022 107 (H)  70 - 99 mg/dL Final   Glucose reference range applies only to samples taken after fasting for at least 8 hours.   Glucose-Capillary 03/07/2022 117 (H)  70 - 99 mg/dL Final   Glucose reference range applies only to samples taken after fasting for at least 8 hours.   Glucose-Capillary 03/07/2022 97  70 - 99 mg/dL Final   Glucose reference range applies only to samples taken after fasting for at least 8 hours.   Glucose-Capillary 03/07/2022 134 (H)  70 - 99 mg/dL Final   Glucose reference range applies only to samples taken after fasting for at least 8 hours.   Glucose-Capillary 03/08/2022 156 (H)  70 - 99 mg/dL Final   Glucose reference range applies only to samples taken after fasting for at least 8 hours.   Glucose-Capillary 03/08/2022 100 (H)  70 - 99 mg/dL Final   Glucose reference range applies only to samples taken after fasting for at least 8 hours.  Hospital Outpatient Visit on 02/27/2022  Component Date Value  Ref Range Status   MRSA, PCR 02/27/2022 NEGATIVE  NEGATIVE Final   Staphylococcus aureus 02/27/2022 NEGATIVE  NEGATIVE Final   Comment: (NOTE) The Xpert SA Assay (FDA approved for NASAL specimens in patients 25 years of age and older), is one component of a comprehensive surveillance program. It is not intended to diagnose infection nor to guide or monitor treatment. Performed at [facility], [place] [address] [place], [place] [address]    Hgb A1c MFr Bld 02/27/2022 6.0 (H)  4.8 - 5.6 % Final   Comment: (NOTE) Pre diabetes:          5.7%-6.4%  Diabetes:              >6.4%  Glycemic control for   <7.0% adults with diabetes    Mean Plasma Glucose 02/27/2022 125.5  mg/dL Final   Performed at [facility], [place] [address] [place], [place] [address]   Sodium 02/27/2022 138  135 - 145 mmol/L Final   Potassium 02/27/2022 3.4 (L)  3.5 - 5.1 mmol/L Final   Chloride 02/27/2022 106  98 - 111 mmol/L Final   CO2 02/27/2022 24  22 - 32 mmol/L Final   Glucose, Bld 02/27/2022 96  70 - 99 mg/dL Final   Glucose reference range applies only to samples taken after fasting for at least 8 hours.   BUN 02/27/2022 15  8 - 23 mg/dL Final   Creatinine, Ser 02/27/2022 1.01 (H)  0.44 - 1.00 mg/dL Final   Calcium 02/27/2022 9.0  8.9 - 10.3 mg/dL Final   GFR, Estimated 02/27/2022 >60  >60 mL/min Final   Comment: (NOTE) Calculated using the CKD-EPI Creatinine Equation (2021)    Anion gap 02/27/2022 8  5 - 15 Final   Performed at [facility], [place] [address] [place], [place] [address]   WBC 02/27/2022 4.3  4.0 - 10.5 K/uL Final   RBC 02/27/2022 4.78  3.87 - 5.11 MIL/uL Final   Hemoglobin 02/27/2022 13.8  12.0 - 15.0 g/dL Final   HCT 02/27/2022 40.9  36.0 - 46.0 % Final   MCV 02/27/2022 85.6  80.0 - 100.0 fL Final   MCH 02/27/2022 28.9  26.0 - 34.0 pg Final   MCHC 02/27/2022 33.7  30.0 - 36.0 g/dL Final   RDW 02/27/2022 14.2  11.5 - 15.5 % Final   Platelets  02/27/2022 213  150 - 400 K/uL Final   nRBC 02/27/2022 0.0  0.0 - 0.2 % Final   Performed at [facility], [place] [address], [place], [place] [address]   ABO/RH(D) 02/27/2022 A POS   Final   Antibody Screen 02/27/2022 NEG   Final   Sample Expiration 02/27/2022 03/08/2022,2359   Final   Extend sample reason 02/27/2022    Final                   Value:NO TRANSFUSIONS OR PREGNANCY IN THE PAST 3 MONTHS Performed at [place] [address], [place], [place] [address]    Glucose-Capillary 02/27/2022 89  70 - 99 mg/dL Final   Glucose reference range applies only to samples taken after fasting for at least 8 hours.     X-Rays:No results found.  EKG: Orders placed or performed during the hospital encounter of 04/18/21   ED EKG   ED EKG   EKG 12-Lead   EKG 12-Lead   EKG     Hospital Course: Rhonda Patton is a 61 y.o. who was admitted to [facility]. They were brought to the operating room on 03/05/2022 and underwent Procedure(s): Left knee polyethylene revision.  Patient tolerated the procedure well and was later transferred to the recovery room and then to the orthopaedic floor for postoperative care. They were given PO and IV analgesics for pain control following their surgery. They were given 24 hours of postoperative antibiotics of  Anti-infectives (From admission, onward)    Start     Dose/Rate Route Frequency Ordered Stop   03/06/22 0600  ceFAZolin (ANCEF) IVPB 2g/100 mL premix        2 g 200 mL/hr over 30 Minutes Intravenous On call to O.R. 03/05/22 1218 03/06/22 0608   03/05/22 2000  ceFAZolin (ANCEF) IVPB 2g/100 mL premix        2 g 200 mL/hr over 30 Minutes Intravenous Every 6 hours 03/05/22 1641 03/06/22 0131      and started on DVT prophylaxis in the form of  Eliquis .   PT and OT were ordered for total joint protocol. Discharge planning consulted to help with postop disposition and equipment needs. Patient had a fair  night on the evening of surgery. They started to get up OOB with therapy on POD #1. On POD #1, started to experience nausea which was likely a side effect from oxycodone. Switched to dilaudid and monitored. Nausea was more intermittent but by POD #3 had resolved. Continued to work with therapy into POD #3. Patient was seen during rounds on day three and was ready to go home. Dressing was changed and the incision was C/D/I. Patient worked with therapy for 5 additional sessions and was meeting their goals. She was discharged to home later that day in stable condition.  Diet: Regular diet Activity: WBAT Follow-up: in 2 weeks Disposition: Home Discharged Condition: stable   Discharge Instructions     Call MD / Call 911   Complete by: As directed    If you experience chest pain or shortness of breath, CALL 911 and be transported to the hospital emergency room.  If you develope a fever above 101 F, pus (white drainage) or increased drainage or redness at the wound, or calf pain, call your surgeon's office.   Change dressing   Complete by: As directed    You may remove the bulky bandage (ACE wrap and gauze) two days after surgery. You will have an adhesive waterproof bandage underneath. Leave this in place until your first follow-up appointment.   Constipation Prevention   Complete by: As directed    Drink plenty of fluids.  Prune juice may be helpful.  You may use a stool softener, such as Colace (over the counter) 100 mg twice a day.  Use MiraLax (over the counter) for constipation as needed.   Diet - low sodium heart healthy   Complete by: As directed    Do not put a pillow under the knee. Place it under the heel.   Complete by: As directed    Driving restrictions   Complete by: As directed    No driving for two weeks   Post-operative opioid taper instructions:   Complete by: As directed    POST-OPERATIVE OPIOID TAPER INSTRUCTIONS: It is important to wean off of your opioid medication as  soon as possible. If you do not need pain medication after your surgery it is ok to stop day one. Opioids include: Codeine, Hydrocodone(Norco, Vicodin), Oxycodone(Percocet, oxycontin) and hydromorphone amongst others.  Long term and even short term use of opiods can cause: Increased pain response Dependence Constipation Depression Respiratory depression And more.  Withdrawal symptoms can include Flu like symptoms Nausea, vomiting And more Techniques to manage these symptoms Hydrate well Eat regular healthy meals Stay active Use relaxation techniques(deep breathing, meditating, yoga) Do Not substitute Alcohol to help with tapering If you have been on opioids for less than two weeks and do not have  pain than it is ok to stop all together.  Plan to wean off of opioids This plan should start within one week post op of your joint replacement. Maintain the same interval or time between taking each dose and first decrease the dose.  Cut the total daily intake of opioids by one tablet each day Next start to increase the time between doses. The last dose that should be eliminated is the evening dose.      TED hose   Complete by: As directed    Use stockings (TED hose) for three weeks on both leg(s).  You may remove them at night for sleeping.   Weight bearing as tolerated   Complete by: As directed       Allergies as of 03/08/2022       Reactions   Codeine Shortness Of Breath, Nausea And Vomiting   Aspirin Nausea Only        Medication List     STOP taking these medications    meloxicam 7.5 MG tablet Commonly known as: MOBIC       TAKE these medications    acetaminophen 325 MG tablet Commonly known as: TYLENOL Take 650 mg by mouth every 6 (six) hours as needed for moderate pain.   Eliquis 2.5 MG Tabs tablet Generic drug: apixaban Take 1 tablet (2.5 mg total) by mouth 2 (two) times daily for 21 days.   famotidine 40 MG tablet Commonly known as: PEPCID Take 40  mg by mouth at bedtime.   hydrochlorothiazide 25 MG tablet Commonly known as: HYDRODIURIL Take 25 mg by mouth daily.   HYDROmorphone 2 MG tablet Commonly known as: DILAUDID Take 1 tablet by mouth every 6 hours as needed for severe pain.   Levemir FlexPen 100 UNIT/ML FlexPen Generic drug: insulin detemir Inject 10 Units into the skin at bedtime.   levocetirizine 5 MG tablet Commonly known as: XYZAL Take 5 mg by mouth at bedtime.   methocarbamol 500 MG tablet Commonly known as: ROBAXIN Take 1 tablet (500 mg total) by mouth every 6 (six) hours as needed for muscle spasms. What changed:  medication strength how much to take   ondansetron 4 MG tablet Commonly known as: ZOFRAN Take 1 tablet (4 mg total) by mouth every 6 (six) hours as needed for nausea.   pantoprazole 40 MG tablet Commonly known as: PROTONIX TAKE 1 TABLET BY MOUTH EVERY DAY   rosuvastatin 40 MG tablet Commonly known as: CRESTOR Take 40 mg by mouth daily.   traMADol 50 MG tablet Commonly known as: ULTRAM Take 50 mg by mouth every 6 (six) hours as needed for pain.               Discharge Care Instructions  (From admission, onward)           Start     Ordered   03/06/22 0000  Weight bearing as tolerated        03/06/22 0731   03/06/22 0000  Change dressing       Comments: You may remove the bulky bandage (ACE wrap and gauze) two days after surgery. You will have an adhesive waterproof bandage underneath. Leave this in place until your first follow-up appointment.   03/06/22 0731            Follow-up Information     Gaynelle Arabian, MD. Schedule an appointment as soon as possible for a visit in 2 week(s).   Specialty: Orthopedic Surgery Contact information: [address] [place] [address] [phone]                 Signed: R. Jaynie Bream, PA-C Orthopedic Surgery 03/10/2022, 1:27 PM

## 2022-03-19 DIAGNOSIS — M25562 Pain in left knee: Secondary | ICD-10-CM | POA: Diagnosis not present

## 2022-03-24 ENCOUNTER — Other Ambulatory Visit (HOSPITAL_COMMUNITY): Payer: Self-pay

## 2022-03-24 MED ORDER — HYDROMORPHONE HCL 2 MG PO TABS
ORAL_TABLET | ORAL | 0 refills | Status: DC
  Filled 2022-03-24: qty 42, 7d supply, fill #0

## 2022-03-25 ENCOUNTER — Other Ambulatory Visit (HOSPITAL_COMMUNITY): Payer: Self-pay

## 2022-03-25 DIAGNOSIS — M25562 Pain in left knee: Secondary | ICD-10-CM | POA: Diagnosis not present

## 2022-03-26 ENCOUNTER — Other Ambulatory Visit (HOSPITAL_COMMUNITY): Payer: Self-pay

## 2022-03-27 DIAGNOSIS — M25562 Pain in left knee: Secondary | ICD-10-CM | POA: Diagnosis not present

## 2022-04-01 DIAGNOSIS — M25562 Pain in left knee: Secondary | ICD-10-CM | POA: Diagnosis not present

## 2022-04-03 DIAGNOSIS — M25562 Pain in left knee: Secondary | ICD-10-CM | POA: Diagnosis not present

## 2022-04-08 DIAGNOSIS — T84023D Instability of internal left knee prosthesis, subsequent encounter: Secondary | ICD-10-CM | POA: Diagnosis not present

## 2022-04-08 DIAGNOSIS — Z96652 Presence of left artificial knee joint: Secondary | ICD-10-CM | POA: Diagnosis not present

## 2022-04-10 DIAGNOSIS — M25562 Pain in left knee: Secondary | ICD-10-CM | POA: Diagnosis not present

## 2022-04-11 ENCOUNTER — Encounter (HOSPITAL_COMMUNITY): Admission: RE | Admit: 2022-04-11 | Payer: BC Managed Care – PPO | Source: Ambulatory Visit

## 2022-04-15 ENCOUNTER — Other Ambulatory Visit (HOSPITAL_COMMUNITY): Payer: Self-pay | Admitting: Family Medicine

## 2022-04-15 DIAGNOSIS — Z13820 Encounter for screening for osteoporosis: Secondary | ICD-10-CM

## 2022-04-15 DIAGNOSIS — Z1231 Encounter for screening mammogram for malignant neoplasm of breast: Secondary | ICD-10-CM

## 2022-05-05 ENCOUNTER — Ambulatory Visit (HOSPITAL_COMMUNITY): Payer: BC Managed Care – PPO

## 2022-05-11 ENCOUNTER — Other Ambulatory Visit (HOSPITAL_COMMUNITY): Payer: Self-pay

## 2022-05-12 ENCOUNTER — Other Ambulatory Visit (HOSPITAL_COMMUNITY): Payer: Self-pay

## 2022-05-12 ENCOUNTER — Ambulatory Visit (HOSPITAL_COMMUNITY)
Admission: RE | Admit: 2022-05-12 | Discharge: 2022-05-12 | Disposition: A | Discharge status: Home / Self Care | Payer: BC Managed Care – PPO | Source: Ambulatory Visit | Attending: Family Medicine | Admitting: Family Medicine

## 2022-05-12 DIAGNOSIS — Z1231 Encounter for screening mammogram for malignant neoplasm of breast: Secondary | ICD-10-CM | POA: Insufficient documentation

## 2022-05-12 DIAGNOSIS — Z13820 Encounter for screening for osteoporosis: Secondary | ICD-10-CM | POA: Insufficient documentation

## 2022-05-12 DIAGNOSIS — Z780 Asymptomatic menopausal state: Secondary | ICD-10-CM | POA: Diagnosis not present

## 2022-05-13 DIAGNOSIS — E119 Type 2 diabetes mellitus without complications: Secondary | ICD-10-CM | POA: Diagnosis not present

## 2022-05-13 DIAGNOSIS — E785 Hyperlipidemia, unspecified: Secondary | ICD-10-CM | POA: Diagnosis not present

## 2022-05-14 ENCOUNTER — Other Ambulatory Visit (HOSPITAL_COMMUNITY): Payer: Self-pay

## 2022-05-14 DIAGNOSIS — M1711 Unilateral primary osteoarthritis, right knee: Secondary | ICD-10-CM | POA: Diagnosis not present

## 2022-05-19 DIAGNOSIS — Z0001 Encounter for general adult medical examination with abnormal findings: Secondary | ICD-10-CM | POA: Diagnosis not present

## 2022-06-18 DIAGNOSIS — R6889 Other general symptoms and signs: Secondary | ICD-10-CM | POA: Diagnosis not present

## 2022-06-18 DIAGNOSIS — R059 Cough, unspecified: Secondary | ICD-10-CM | POA: Diagnosis not present

## 2022-06-18 DIAGNOSIS — R0981 Nasal congestion: Secondary | ICD-10-CM | POA: Diagnosis not present

## 2022-06-18 DIAGNOSIS — E1165 Type 2 diabetes mellitus with hyperglycemia: Secondary | ICD-10-CM | POA: Diagnosis not present

## 2022-07-03 DIAGNOSIS — M1711 Unilateral primary osteoarthritis, right knee: Secondary | ICD-10-CM | POA: Diagnosis not present

## 2022-07-10 DIAGNOSIS — M1711 Unilateral primary osteoarthritis, right knee: Secondary | ICD-10-CM | POA: Diagnosis not present

## 2022-08-21 DIAGNOSIS — E1165 Type 2 diabetes mellitus with hyperglycemia: Secondary | ICD-10-CM | POA: Diagnosis not present

## 2022-08-21 DIAGNOSIS — I10 Essential (primary) hypertension: Secondary | ICD-10-CM | POA: Diagnosis not present

## 2022-08-22 DIAGNOSIS — M1711 Unilateral primary osteoarthritis, right knee: Secondary | ICD-10-CM | POA: Diagnosis not present

## 2022-08-22 DIAGNOSIS — Z96652 Presence of left artificial knee joint: Secondary | ICD-10-CM | POA: Diagnosis not present

## 2022-08-26 DIAGNOSIS — E1165 Type 2 diabetes mellitus with hyperglycemia: Secondary | ICD-10-CM | POA: Diagnosis not present

## 2022-08-26 DIAGNOSIS — E785 Hyperlipidemia, unspecified: Secondary | ICD-10-CM | POA: Diagnosis not present

## 2022-08-26 DIAGNOSIS — I10 Essential (primary) hypertension: Secondary | ICD-10-CM | POA: Diagnosis not present

## 2022-08-26 DIAGNOSIS — N189 Chronic kidney disease, unspecified: Secondary | ICD-10-CM | POA: Diagnosis not present

## 2022-08-30 DIAGNOSIS — N6452 Nipple discharge: Secondary | ICD-10-CM | POA: Diagnosis not present

## 2022-09-02 ENCOUNTER — Other Ambulatory Visit (HOSPITAL_COMMUNITY): Payer: Self-pay | Admitting: Family Medicine

## 2022-09-02 ENCOUNTER — Encounter (HOSPITAL_COMMUNITY): Payer: Self-pay | Admitting: Family Medicine

## 2022-09-02 DIAGNOSIS — N6452 Nipple discharge: Secondary | ICD-10-CM

## 2022-09-09 ENCOUNTER — Ambulatory Visit (HOSPITAL_COMMUNITY)
Admission: RE | Admit: 2022-09-09 | Discharge: 2022-09-09 | Disposition: A | Discharge status: Home / Self Care | Payer: BC Managed Care – PPO | Source: Ambulatory Visit | Attending: Family Medicine | Admitting: Family Medicine

## 2022-09-09 ENCOUNTER — Other Ambulatory Visit (HOSPITAL_COMMUNITY): Payer: Self-pay | Admitting: Family Medicine

## 2022-09-09 DIAGNOSIS — R928 Other abnormal and inconclusive findings on diagnostic imaging of breast: Secondary | ICD-10-CM

## 2022-09-09 DIAGNOSIS — N6452 Nipple discharge: Secondary | ICD-10-CM | POA: Insufficient documentation

## 2022-09-09 DIAGNOSIS — R92323 Mammographic fibroglandular density, bilateral breasts: Secondary | ICD-10-CM | POA: Diagnosis not present

## 2022-09-16 ENCOUNTER — Other Ambulatory Visit (HOSPITAL_COMMUNITY): Payer: Self-pay | Admitting: Internal Medicine

## 2022-09-16 ENCOUNTER — Ambulatory Visit (HOSPITAL_COMMUNITY)
Admission: RE | Admit: 2022-09-16 | Discharge: 2022-09-16 | Disposition: A | Discharge status: Home / Self Care | Payer: BC Managed Care – PPO | Source: Ambulatory Visit | Attending: Internal Medicine | Admitting: Internal Medicine

## 2022-09-16 ENCOUNTER — Ambulatory Visit (HOSPITAL_COMMUNITY)
Admission: RE | Admit: 2022-09-16 | Discharge: 2022-09-16 | Disposition: A | Discharge status: Home / Self Care | Payer: BC Managed Care – PPO | Source: Ambulatory Visit | Attending: Family Medicine | Admitting: Family Medicine

## 2022-09-16 DIAGNOSIS — N6322 Unspecified lump in the left breast, upper inner quadrant: Secondary | ICD-10-CM | POA: Diagnosis not present

## 2022-09-16 DIAGNOSIS — R928 Other abnormal and inconclusive findings on diagnostic imaging of breast: Secondary | ICD-10-CM | POA: Diagnosis not present

## 2022-09-16 DIAGNOSIS — D242 Benign neoplasm of left breast: Secondary | ICD-10-CM | POA: Diagnosis not present

## 2022-09-16 HISTORY — PX: BREAST BIOPSY: SHX20

## 2022-09-16 MED ORDER — LIDOCAINE HCL (PF) 2 % IJ SOLN
INTRAMUSCULAR | Status: AC
  Filled 2022-09-16: qty 10

## 2022-09-17 LAB — SURGICAL PATHOLOGY

## 2022-09-24 ENCOUNTER — Other Ambulatory Visit: Payer: Self-pay

## 2022-09-24 ENCOUNTER — Ambulatory Visit: Payer: BC Managed Care – PPO | Admitting: Surgery

## 2022-09-24 ENCOUNTER — Encounter: Payer: Self-pay | Admitting: Surgery

## 2022-09-24 ENCOUNTER — Other Ambulatory Visit (HOSPITAL_COMMUNITY): Payer: Self-pay | Admitting: Surgery

## 2022-09-24 ENCOUNTER — Encounter: Payer: Self-pay | Admitting: *Deleted

## 2022-09-24 VITALS — BP 110/70 | HR 72 | Temp 97.8°F | Resp 16 | Ht 63.0 in | Wt 256.0 lb

## 2022-09-24 DIAGNOSIS — D242 Benign neoplasm of left breast: Secondary | ICD-10-CM | POA: Diagnosis not present

## 2022-09-24 DIAGNOSIS — R928 Other abnormal and inconclusive findings on diagnostic imaging of breast: Secondary | ICD-10-CM

## 2022-09-24 NOTE — H&P (Signed)
[facility] History and Physical  Reason for Referral: Intraductal papilloma Referring Physician: Cecile Sheerer, NP  Chief Complaint   New Patient (Initial Visit)     Rhonda Patton is a 61 y.o. female.  HPI: Patient presents for evaluation of intraductal papilloma.  She underwent her screening mammogram on 05/13/2022, and no abnormalities were noted at that time.  Beginning in mid November, she noted that she had initially orange/yellow nipple discharge from her left breast which then progressed to a bloody discharge.  At that time, her primary care doctor sent her over for mammographic evaluation of her left breast.  She was noted to have a new concerning lesion at the 9:00 retroareolar position.  This was subsequently biopsied, and pathology returned with intraductal papilloma.  She confirms some tenderness just superior to her nipple and posterior to her nipple since this discharge started. The patient has no history of any masses, lumps, and bumps. She had menarche at age 28, and her first pregnancy at age 18. She is G3P3, but her eldest son died from leukemia.  She has a possible history of breast cancer in her mother, and she believes she was diagnosed in her 47s.  Her sister has also been diagnosed with a bone cancer.  She denies any known history of ovarian or uterine cancers.  She underwent menopause at 69.  She has never had any previous biopsies or concerning areas on mammogram.  She has not had any chest radiation.  Her past medical history is significant for hypertension and diabetes on Mounjaro.  She was taking Eliquis after one of her knee replacements, but is no longer taking it.  She has a past surgical history significant for hysterectomy, and cholecystectomy.  She denies use of tobacco products, alcohol, and illicit drugs.   Past Medical History:  Diagnosis Date   Arthritis    Carpal tunnel syndrome of right wrist    Chronic pain    Diabetes (HCC)     GERD (gastroesophageal reflux disease)    Hypercholesteremia    Hypertension    PONV (postoperative nausea and vomiting)    Sleep apnea    diagnosed but doesn't use CPAP;     Past Surgical History:  Procedure Laterality Date   ABDOMINAL HYSTERECTOMY     BALLOON DILATION N/A 10/09/2020   Procedure: BALLOON DILATION;  Surgeon: Eloise Harman, DO;  [facility];  Service: Endoscopy;  Laterality: N/A;   BIOPSY  08/25/2017   Procedure: BIOPSY;  Surgeon: Danie Binder, MD;  [facility];  Service: Endoscopy;;  gastric biopsy, gastric polyp   BIOPSY  10/09/2020   Procedure: BIOPSY;  Surgeon: Eloise Harman, DO;  [facility];  Service: Endoscopy;;   BREAST BIOPSY Left 09/16/2022   [place] LT BREAST BX W LOC DEV 1ST LESION IMG BX SPEC US GUIDE 09/16/2022 [facility]   CESAREAN SECTION     x 3   CHOLECYSTECTOMY     COLONOSCOPY WITH PROPOFOL N/A 08/25/2017   TI appeared normal. 5 mm polyp in rectum. Sessile. Redundant colon. Internal hemorrhoids. Hyperplastic.    ESOPHAGOGASTRODUODENOSCOPY (EGD) WITH PROPOFOL N/A 08/25/2017   benign-apearing esophageal stenosis, dilation, small hiatal hernia, mild gastritis due to NSAIDs. Negative H.pylori.    ESOPHAGOGASTRODUODENOSCOPY (EGD) WITH PROPOFOL N/A 10/09/2020   Z-line irregular, benign-appearing esophageal stenosis, small hiatal hernia, gastritis, normal duodenum. +Barrett's esophagus, reactive gastropathy with erosions, negative celiac. Surveillance EGD in 2024.    FOOT SURGERY Right  x 2-bone spur   GASTROCNEMIUS RECESSION Right 12/18/2020   Procedure: RIGHT GASTROCNEMIUS RECSSION AND PLANTAR FASCIA RELEASE;  Surgeon: [name], MD;  Location: [facility];  Service: Orthopedics;  Laterality: Right;   HAND SURGERY Right    x 2-carpal tunnel   KNEE ARTHROSCOPY Left    KNEE ARTHROSCOPY WITH MEDIAL MENISECTOMY Left 05/07/2017   Procedure: KNEE ARTHROSCOPY WITH MEDIAL MENISECTOMY AND LATERAL  MENISECTOMY, limited debriedment;  Surgeon: Carole Civil, MD;  Location: [facility];  Service: Orthopedics;  Laterality: Left;   PARTIAL HYSTERECTOMY  2001   total abdominal   POLYPECTOMY  08/25/2017   Procedure: POLYPECTOMY;  Surgeon: Danie Binder, MD;  [facility];  Service: Endoscopy;;  rectal polyp cs   SAVORY DILATION N/A 08/25/2017   Procedure: SAVORY DILATION;  Surgeon: Danie Binder, MD;  [facility];  Service: Endoscopy;  Laterality: N/A;   TOTAL KNEE ARTHROPLASTY Left 09/24/2017   Procedure: TOTAL KNEE ARTHROPLASTY;  Surgeon: Carole Civil, MD;  [facility];  Service: Orthopedics;  Laterality: Left;   TOTAL KNEE REVISION Left 03/05/2022   Procedure: Left knee polyethylene revision;  Surgeon: Gaynelle Arabian, MD;  Location: [facility];  Service: Orthopedics;  Laterality: Left;   TRIGGER FINGER RELEASE Right 11/27/2016   Procedure: RELEASE TRIGGER FINGER/A-1 PULLEY RIGHT LONG FINGER;  Surgeon: Carole Civil, MD;  Location: [facility];  Service: Orthopedics;  Laterality: Right;  pt knows to arrive at 8:45    Family History  Problem Relation Age of Onset   Heart disease Other    Arthritis Other    Cancer Other    Diabetes Other    Colon cancer Mother 31   Gastric cancer Neg Hx    Esophageal cancer Neg Hx     Social History   Tobacco Use   Smoking status: Never   Smokeless tobacco: Never  Vaping Use   Vaping Use: Never used  Substance Use Topics   Alcohol use: No   Drug use: No    Medications: I have reviewed the patient's current medications. Allergies as of 09/24/2022       Reactions   Codeine Shortness Of Breath, Nausea And Vomiting   Aspirin Nausea Only        Medication List        Accurate as of September 24, 2022  1:47 PM. If you have any questions, ask your nurse or doctor.          acetaminophen 325 MG tablet Commonly known as: TYLENOL Take 650 mg by mouth every 6 (six) hours as needed for moderate pain.    Eliquis 2.5 MG Tabs tablet Generic drug: apixaban Take 1 tablet (2.5 mg total) by mouth 2 (two) times daily for 21 days.   ezetimibe 10 MG tablet Commonly known as: ZETIA Take 1 tablet by mouth daily.   famotidine 40 MG tablet Commonly known as: PEPCID Take 40 mg by mouth at bedtime.   hydrochlorothiazide 25 MG tablet Commonly known as: HYDRODIURIL Take 25 mg by mouth daily.   HYDROmorphone 2 MG tablet Commonly known as: DILAUDID Take 1-2 tablets by mouth every 6 hours as needed for severe pain What changed: Another medication with the same name was removed. Continue taking this medication, and follow the directions you see here. Changed by: Flint Melter Jari Dipasquale, DO   Levemir FlexPen 100 UNIT/ML FlexPen Generic drug: insulin detemir Inject 10 Units into the skin at bedtime.   levocetirizine 5  MG tablet Commonly known as: XYZAL Take 5 mg by mouth at bedtime.   meloxicam 7.5 MG tablet Commonly known as: MOBIC Take 7.5 mg by mouth daily.   methocarbamol 500 MG tablet Commonly known as: ROBAXIN Take 1 tablet (500 mg total) by mouth every 6 (six) hours as needed for muscle spasms.   Mounjaro 5 MG/0.5ML Pen Generic drug: tirzepatide Inject 5 mg into the skin once a week.   ondansetron 4 MG tablet Commonly known as: ZOFRAN Take 1 tablet (4 mg total) by mouth every 6 (six) hours as needed for nausea.   pantoprazole 40 MG tablet Commonly known as: PROTONIX TAKE 1 TABLET BY MOUTH EVERY DAY   rosuvastatin 40 MG tablet Commonly known as: CRESTOR Take 40 mg by mouth daily.   traMADol 50 MG tablet Commonly known as: ULTRAM Take 50 mg by mouth every 6 (six) hours as needed for pain.         ROS:  Constitutional: negative for chills, fatigue, and fevers Eyes: positive for visual disturbance, negative for pain Ears, nose, mouth, throat, and face: positive for sinus problems, negative for ear drainage and sore throat Respiratory: negative for cough, wheezing, and  shortness of breath Cardiovascular: negative for chest pain and palpitations Gastrointestinal: positive for reflux symptoms, negative for abdominal pain, nausea, and vomiting Genitourinary:negative for dysuria, frequency, and urinary retention Integument/breast: negative for dryness and rash Hematologic/lymphatic: negative for bleeding and lymphadenopathy Musculoskeletal:positive for back pain, neck pain, and joint pain Neurological: positive for dizziness, negative for tremors and numbness Endocrine: negative for temperature intolerance  There were no vitals taken for this visit. Physical Exam Vitals reviewed.  Constitutional:      Appearance: Normal appearance.  HENT:     Head: Normocephalic and atraumatic.  Eyes:     Extraocular Movements: Extraocular movements intact.     Pupils: Pupils are equal, round, and reactive to light.  Cardiovascular:     Rate and Rhythm: Normal rate and regular rhythm.  Pulmonary:     Effort: Pulmonary effort is normal.     Breath sounds: Normal breath sounds.  Chest:  Breasts:    Right: Normal. No swelling, bleeding, inverted nipple, mass, nipple discharge, skin change or tenderness.     Left: No swelling, bleeding, inverted nipple, nipple discharge, skin change or tenderness.     Comments: Left breast with bruising at biopsy site, palpable fullness near biopsy site without discrete mass palpable Abdominal:     General: There is no distension.     Palpations: Abdomen is soft.     Tenderness: There is no abdominal tenderness.  Musculoskeletal:        General: Normal range of motion.     Cervical back: Normal range of motion.  Lymphadenopathy:     Upper Body:     Right upper body: No supraclavicular or axillary adenopathy.     Left upper body: No supraclavicular or axillary adenopathy.  Skin:    General: Skin is warm and dry.  Neurological:     General: No focal deficit present.     Mental Status: She is alert and oriented to person, place,  and time.  Psychiatric:        Mood and Affect: Mood normal.        Behavior: Behavior normal.     Results: Bilateral screening mammogram (05/12/2022): IMPRESSION: No mammographic evidence of malignancy. A result letter of this screening mammogram will be mailed directly to the patient.   RECOMMENDATION: Screening mammogram in  one year. (Code:SM-B-01Y)   BI-RADS CATEGORY  1: Negative.  Left breast diagnostic mammogram (09/09/2022): IMPRESSION: Focally dilated duct with 0.7 cm intraductal mass in the left breast at 9 o'clock subareolar, with findings suspicious for papilloma given new onset spontaneous bloody left nipple discharge.   RECOMMENDATION: Recommend biopsy mass in the left breast at 9 o'clock retroareolar.   I have discussed the findings and recommendations with the patient. If applicable, a reminder letter will be sent to the patient regarding the next appointment.   BI-RADS CATEGORY  4: Suspicious.  Left breast ultrasound including axilla (09/09/2022): IMPRESSION: Focally dilated duct with 0.7 cm intraductal mass in the left breast at 9 o'clock subareolar, with findings suspicious for papilloma given new onset spontaneous bloody left nipple discharge.   RECOMMENDATION: Recommend biopsy mass in the left breast at 9 o'clock retroareolar.   I have discussed the findings and recommendations with the patient. If applicable, a reminder letter will be sent to the patient regarding the next appointment.   BI-RADS CATEGORY  4: Suspicious.  Left breast clip placement (09/16/2022): IMPRESSION: Ribbon shaped biopsy marking clip at site biopsied mass in the left breast at 9 o'clock retroareolar.   Final Assessment: Post Procedure Mammograms for Marker Placement  Ultrasound left breast needlework with clip placement (09/16/2022): ADDENDUM: PATHOLOGY revealed: A. BREAST, LEFT, 9:00 O'CLOCK, BIOPSY: - Intraductal papilloma.   Pathology results are CONCORDANT with  imaging findings, per Dr. Everlean Alstrom with possible excision recommended.   Pathology results and recommendations were discussed with patient via telephone on 09/17/2022. Patient reported biopsy site doing well with no adverse symptoms, and only slight tenderness at the site. Post biopsy care instructions were reviewed, questions were answered and my direct phone number was provided. Patient was instructed to call [facility] Mammography Department for any additional questions or concerns related to biopsy site.   RECOMMENDATION: Surgical consultation for possible excision given patient's recent onset of LEFT nipple discharge. Request for surgical consultation was relayed to Kathi Der RT at [facility] Mammography Department by Electa Sniff RN on 09/18/2022.   Pathology results reported by Electa Sniff RN on 09/18/2022.   Assessment & Plan:  Rhonda Patton is a 61 y.o. female who presents for evaluation of intraductal papilloma.  -I explained the pathophysiology of intraductal papilloma.  I explained that this is a benign lesion, however it can mask underlying atypia, DCIS, or cancer.  For this reason, we recommend excision of this area -The risk and benefits of left breast excisional biopsy via radiofrequency tag were discussed including but not limited to bleeding, infection, injury to surrounding structures, seroma formation, poor wound healing, and need for additional procedures.  After careful consideration, Rhonda Patton has decided to proceed with surgery.  -I further explained that if her final pathology comes back with either cancer or DCIS positive at a margin, that she may need reexcision of the margin or sentinel lymph node biopsy -Patient scheduled for radiofrequency tag placement on 10/21/22 and surgery on 10/29/2022 -Information provided to the patient regarding intraductal papilloma and lumpectomy -Advised her that she will need  to hold off on her Mounjaro injection the Saturday prior to surgery  All questions were answered to the satisfaction of the patient and family.  Graciella Freer, DO [facility] [address] [place], [place] [id number] [phone] (office)

## 2022-09-24 NOTE — Progress Notes (Signed)
[facility] History and Physical  Reason for Referral: Intraductal papilloma Referring Physician: Cecile Sheerer, NP  Chief Complaint   New Patient (Initial Visit)     Rhonda Patton is a 61 y.o. female.  HPI: Patient presents for evaluation of intraductal papilloma.  She underwent her screening mammogram on 05/13/2022, and no abnormalities were noted at that time.  Beginning in mid November, she noted that she had initially orange/yellow nipple discharge from her left breast which then progressed to a bloody discharge.  At that time, her primary care doctor sent her over for mammographic evaluation of her left breast.  She was noted to have a new concerning lesion at the 9:00 retroareolar position.  This was subsequently biopsied, and pathology returned with intraductal papilloma.  She confirms some tenderness just superior to her nipple and posterior to her nipple since this discharge started. The patient has no history of any masses, lumps, and bumps. She had menarche at age 41, and her first pregnancy at age 28. She is G3P3, but her eldest son died from leukemia.  She has a possible history of breast cancer in her mother, and she believes she was diagnosed in her 72s.  Her sister has also been diagnosed with a bone cancer.  She denies any known history of ovarian or uterine cancers.  She underwent menopause at 60.  She has never had any previous biopsies or concerning areas on mammogram.  She has not had any chest radiation.  Her past medical history is significant for hypertension and diabetes on Mounjaro.  She was taking Eliquis after one of her knee replacements, but is no longer taking it.  She has a past surgical history significant for hysterectomy, and cholecystectomy.  She denies use of tobacco products, alcohol, and illicit drugs.   Past Medical History:  Diagnosis Date   Arthritis    Carpal tunnel syndrome of right wrist    Chronic pain    Diabetes (HCC)     GERD (gastroesophageal reflux disease)    Hypercholesteremia    Hypertension    PONV (postoperative nausea and vomiting)    Sleep apnea    diagnosed but doesn't use CPAP;     Past Surgical History:  Procedure Laterality Date   ABDOMINAL HYSTERECTOMY     BALLOON DILATION N/A 10/09/2020   Procedure: BALLOON DILATION;  Surgeon: Eloise Harman, DO;  [facility];  Service: Endoscopy;  Laterality: N/A;   BIOPSY  08/25/2017   Procedure: BIOPSY;  Surgeon: Danie Binder, MD;  [facility];  Service: Endoscopy;;  gastric biopsy, gastric polyp   BIOPSY  10/09/2020   Procedure: BIOPSY;  Surgeon: Eloise Harman, DO;  [facility];  Service: Endoscopy;;   BREAST BIOPSY Left 09/16/2022   [place] LT BREAST BX W LOC DEV 1ST LESION IMG BX SPEC US GUIDE 09/16/2022 [facility]   CESAREAN SECTION     x 3   CHOLECYSTECTOMY     COLONOSCOPY WITH PROPOFOL N/A 08/25/2017   TI appeared normal. 5 mm polyp in rectum. Sessile. Redundant colon. Internal hemorrhoids. Hyperplastic.    ESOPHAGOGASTRODUODENOSCOPY (EGD) WITH PROPOFOL N/A 08/25/2017   benign-apearing esophageal stenosis, dilation, small hiatal hernia, mild gastritis due to NSAIDs. Negative H.pylori.    ESOPHAGOGASTRODUODENOSCOPY (EGD) WITH PROPOFOL N/A 10/09/2020   Z-line irregular, benign-appearing esophageal stenosis, small hiatal hernia, gastritis, normal duodenum. +Barrett's esophagus, reactive gastropathy with erosions, negative celiac. Surveillance EGD in 2024.    FOOT SURGERY Right  x 2-bone spur   GASTROCNEMIUS RECESSION Right 12/18/2020   Procedure: RIGHT GASTROCNEMIUS RECSSION AND PLANTAR FASCIA RELEASE;  Surgeon: [name], MD;  Location: [facility];  Service: Orthopedics;  Laterality: Right;   HAND SURGERY Right    x 2-carpal tunnel   KNEE ARTHROSCOPY Left    KNEE ARTHROSCOPY WITH MEDIAL MENISECTOMY Left 05/07/2017   Procedure: KNEE ARTHROSCOPY WITH MEDIAL MENISECTOMY AND LATERAL  MENISECTOMY, limited debriedment;  Surgeon: Carole Civil, MD;  Location: [facility];  Service: Orthopedics;  Laterality: Left;   PARTIAL HYSTERECTOMY  2001   total abdominal   POLYPECTOMY  08/25/2017   Procedure: POLYPECTOMY;  Surgeon: Danie Binder, MD;  [facility];  Service: Endoscopy;;  rectal polyp cs   SAVORY DILATION N/A 08/25/2017   Procedure: SAVORY DILATION;  Surgeon: Danie Binder, MD;  [facility];  Service: Endoscopy;  Laterality: N/A;   TOTAL KNEE ARTHROPLASTY Left 09/24/2017   Procedure: TOTAL KNEE ARTHROPLASTY;  Surgeon: Carole Civil, MD;  [facility];  Service: Orthopedics;  Laterality: Left;   TOTAL KNEE REVISION Left 03/05/2022   Procedure: Left knee polyethylene revision;  Surgeon: Gaynelle Arabian, MD;  Location: [facility];  Service: Orthopedics;  Laterality: Left;   TRIGGER FINGER RELEASE Right 11/27/2016   Procedure: RELEASE TRIGGER FINGER/A-1 PULLEY RIGHT LONG FINGER;  Surgeon: Carole Civil, MD;  Location: [facility];  Service: Orthopedics;  Laterality: Right;  pt knows to arrive at 8:45    Family History  Problem Relation Age of Onset   Heart disease Other    Arthritis Other    Cancer Other    Diabetes Other    Colon cancer Mother 28   Gastric cancer Neg Hx    Esophageal cancer Neg Hx     Social History   Tobacco Use   Smoking status: Never   Smokeless tobacco: Never  Vaping Use   Vaping Use: Never used  Substance Use Topics   Alcohol use: No   Drug use: No    Medications: I have reviewed the patient's current medications. Allergies as of 09/24/2022       Reactions   Codeine Shortness Of Breath, Nausea And Vomiting   Aspirin Nausea Only        Medication List        Accurate as of September 24, 2022  1:47 PM. If you have any questions, ask your nurse or doctor.          acetaminophen 325 MG tablet Commonly known as: TYLENOL Take 650 mg by mouth every 6 (six) hours as needed for moderate pain.    Eliquis 2.5 MG Tabs tablet Generic drug: apixaban Take 1 tablet (2.5 mg total) by mouth 2 (two) times daily for 21 days.   ezetimibe 10 MG tablet Commonly known as: ZETIA Take 1 tablet by mouth daily.   famotidine 40 MG tablet Commonly known as: PEPCID Take 40 mg by mouth at bedtime.   hydrochlorothiazide 25 MG tablet Commonly known as: HYDRODIURIL Take 25 mg by mouth daily.   HYDROmorphone 2 MG tablet Commonly known as: DILAUDID Take 1-2 tablets by mouth every 6 hours as needed for severe pain What changed: Another medication with the same name was removed. Continue taking this medication, and follow the directions you see here. Changed by: Flint Melter Christofer Shen, DO   Levemir FlexPen 100 UNIT/ML FlexPen Generic drug: insulin detemir Inject 10 Units into the skin at bedtime.   levocetirizine 5  MG tablet Commonly known as: XYZAL Take 5 mg by mouth at bedtime.   meloxicam 7.5 MG tablet Commonly known as: MOBIC Take 7.5 mg by mouth daily.   methocarbamol 500 MG tablet Commonly known as: ROBAXIN Take 1 tablet (500 mg total) by mouth every 6 (six) hours as needed for muscle spasms.   Mounjaro 5 MG/0.5ML Pen Generic drug: tirzepatide Inject 5 mg into the skin once a week.   ondansetron 4 MG tablet Commonly known as: ZOFRAN Take 1 tablet (4 mg total) by mouth every 6 (six) hours as needed for nausea.   pantoprazole 40 MG tablet Commonly known as: PROTONIX TAKE 1 TABLET BY MOUTH EVERY DAY   rosuvastatin 40 MG tablet Commonly known as: CRESTOR Take 40 mg by mouth daily.   traMADol 50 MG tablet Commonly known as: ULTRAM Take 50 mg by mouth every 6 (six) hours as needed for pain.         ROS:  Constitutional: negative for chills, fatigue, and fevers Eyes: positive for visual disturbance, negative for pain Ears, nose, mouth, throat, and face: positive for sinus problems, negative for ear drainage and sore throat Respiratory: negative for cough, wheezing, and  shortness of breath Cardiovascular: negative for chest pain and palpitations Gastrointestinal: positive for reflux symptoms, negative for abdominal pain, nausea, and vomiting Genitourinary:negative for dysuria, frequency, and urinary retention Integument/breast: negative for dryness and rash Hematologic/lymphatic: negative for bleeding and lymphadenopathy Musculoskeletal:positive for back pain, neck pain, and joint pain Neurological: positive for dizziness, negative for tremors and numbness Endocrine: negative for temperature intolerance  There were no vitals taken for this visit. Physical Exam Vitals reviewed.  Constitutional:      Appearance: Normal appearance.  HENT:     Head: Normocephalic and atraumatic.  Eyes:     Extraocular Movements: Extraocular movements intact.     Pupils: Pupils are equal, round, and reactive to light.  Cardiovascular:     Rate and Rhythm: Normal rate and regular rhythm.  Pulmonary:     Effort: Pulmonary effort is normal.     Breath sounds: Normal breath sounds.  Chest:  Breasts:    Right: Normal. No swelling, bleeding, inverted nipple, mass, nipple discharge, skin change or tenderness.     Left: No swelling, bleeding, inverted nipple, nipple discharge, skin change or tenderness.     Comments: Left breast with bruising at biopsy site, palpable fullness near biopsy site without discrete mass palpable Abdominal:     General: There is no distension.     Palpations: Abdomen is soft.     Tenderness: There is no abdominal tenderness.  Musculoskeletal:        General: Normal range of motion.     Cervical back: Normal range of motion.  Lymphadenopathy:     Upper Body:     Right upper body: No supraclavicular or axillary adenopathy.     Left upper body: No supraclavicular or axillary adenopathy.  Skin:    General: Skin is warm and dry.  Neurological:     General: No focal deficit present.     Mental Status: She is alert and oriented to person, place,  and time.  Psychiatric:        Mood and Affect: Mood normal.        Behavior: Behavior normal.     Results: Bilateral screening mammogram (05/12/2022): IMPRESSION: No mammographic evidence of malignancy. A result letter of this screening mammogram will be mailed directly to the patient.   RECOMMENDATION: Screening mammogram in  one year. (Code:SM-B-01Y)   BI-RADS CATEGORY  1: Negative.  Left breast diagnostic mammogram (09/09/2022): IMPRESSION: Focally dilated duct with 0.7 cm intraductal mass in the left breast at 9 o'clock subareolar, with findings suspicious for papilloma given new onset spontaneous bloody left nipple discharge.   RECOMMENDATION: Recommend biopsy mass in the left breast at 9 o'clock retroareolar.   I have discussed the findings and recommendations with the patient. If applicable, a reminder letter will be sent to the patient regarding the next appointment.   BI-RADS CATEGORY  4: Suspicious.  Left breast ultrasound including axilla (09/09/2022): IMPRESSION: Focally dilated duct with 0.7 cm intraductal mass in the left breast at 9 o'clock subareolar, with findings suspicious for papilloma given new onset spontaneous bloody left nipple discharge.   RECOMMENDATION: Recommend biopsy mass in the left breast at 9 o'clock retroareolar.   I have discussed the findings and recommendations with the patient. If applicable, a reminder letter will be sent to the patient regarding the next appointment.   BI-RADS CATEGORY  4: Suspicious.  Left breast clip placement (09/16/2022): IMPRESSION: Ribbon shaped biopsy marking clip at site biopsied mass in the left breast at 9 o'clock retroareolar.   Final Assessment: Post Procedure Mammograms for Marker Placement  Ultrasound left breast needlework with clip placement (09/16/2022): ADDENDUM: PATHOLOGY revealed: A. BREAST, LEFT, 9:00 O'CLOCK, BIOPSY: - Intraductal papilloma.   Pathology results are CONCORDANT with  imaging findings, per Dr. Everlean Alstrom with possible excision recommended.   Pathology results and recommendations were discussed with patient via telephone on 09/17/2022. Patient reported biopsy site doing well with no adverse symptoms, and only slight tenderness at the site. Post biopsy care instructions were reviewed, questions were answered and my direct phone number was provided. Patient was instructed to call [facility] Mammography Department for any additional questions or concerns related to biopsy site.   RECOMMENDATION: Surgical consultation for possible excision given patient's recent onset of LEFT nipple discharge. Request for surgical consultation was relayed to Kathi Der RT at [facility] Mammography Department by Electa Sniff RN on 09/18/2022.   Pathology results reported by Electa Sniff RN on 09/18/2022.   Assessment & Plan:  LEYA PAIGE is a 61 y.o. female who presents for evaluation of intraductal papilloma.  -I explained the pathophysiology of intraductal papilloma.  I explained that this is a benign lesion, however it can mask underlying atypia, DCIS, or cancer.  For this reason, we recommend excision of this area -The risk and benefits of left breast excisional biopsy via radiofrequency tag were discussed including but not limited to bleeding, infection, injury to surrounding structures, seroma formation, poor wound healing, and need for additional procedures.  After careful consideration, Kienna TAKIYA BELMARES has decided to proceed with surgery.  -I further explained that if her final pathology comes back with either cancer or DCIS positive at a margin, that she may need reexcision of the margin or sentinel lymph node biopsy -Patient scheduled for radiofrequency tag placement on 10/21/22 and surgery on 10/29/2022 -Information provided to the patient regarding intraductal papilloma and lumpectomy -Advised her that she will need  to hold off on her Mounjaro injection the Saturday prior to surgery  All questions were answered to the satisfaction of the patient and family.  Graciella Freer, DO [facility] [address], [place] [address] [phone] (office)

## 2022-10-02 DIAGNOSIS — M1711 Unilateral primary osteoarthritis, right knee: Secondary | ICD-10-CM | POA: Diagnosis not present

## 2022-10-21 ENCOUNTER — Ambulatory Visit (HOSPITAL_COMMUNITY)
Admission: RE | Admit: 2022-10-21 | Discharge: 2022-10-21 | Disposition: A | Discharge status: Home / Self Care | Payer: BC Managed Care – PPO | Source: Ambulatory Visit | Attending: Surgery | Admitting: Surgery

## 2022-10-21 DIAGNOSIS — R928 Other abnormal and inconclusive findings on diagnostic imaging of breast: Secondary | ICD-10-CM

## 2022-10-21 MED ORDER — LIDOCAINE HCL (PF) 2 % IJ SOLN
INTRAMUSCULAR | Status: AC
  Filled 2022-10-21: qty 10

## 2022-10-24 ENCOUNTER — Other Ambulatory Visit (HOSPITAL_COMMUNITY): Payer: Self-pay

## 2022-10-24 NOTE — Patient Instructions (Signed)
Rhonda Patton  10/24/2022     '@[email or web address]'$ @   Your procedure is scheduled on  10/29/2022.   Report to [facility] at  0600  A.M.   Call this number if you have problems the morning of surgery:  [phone]  If you experience any cold or flu symptoms such as cough, fever, chills, shortness of breath, etc. between now and your scheduled surgery, please notify us at the above number.   Remember:  Do not eat or drink after midnight.        Your last dose of mounjaro should have been on 10/21/2022.      DO NOT take any medications for diabetes the morning of your procedure.     Take these medicines the morning of surgery with A SIP OF WATER                  xyzal, protonix, tramadol(if needed).     Do not wear jewelry, make-up or nail polish.  Do not wear lotions, powders, or perfumes, or deodorant.  Do not shave 48 hours prior to surgery.  Men may shave face and neck.  Do not bring valuables to the hospital.  [facility] is not responsible for any belongings or valuables.  Contacts, dentures or bridgework may not be worn into surgery.  Leave your suitcase in the car.  After surgery it may be brought to your room.  For patients admitted to the hospital, discharge time will be determined by your treatment team.  Patients discharged the day of surgery will not be allowed to drive home and must have someone with them for 24 hours.    Special instructions:   DO NOT smoke tobacco or vape for 24 hours before your procedure.  Please read over the following fact sheets that you were given. Coughing and Deep Breathing, Surgical Site Infection Prevention, Anesthesia Post-op Instructions, and Care and Recovery After Surgery      Breast Biopsy, Care After The following information offers guidance on how to care for yourself after your breast biopsy. Your doctor may also give you more specific instructions. If you have problems or questions, contact your  doctor. What can I expect after the procedure? After a breast biopsy, it is common to have: Bruising on your breast. Breast swelling. Numbness, tingling, or pain near your biopsy site. This site is where tissue was taken out for study. Follow these instructions at home: Medicines Take over-the-counter and prescription medicines only as told by your doctor. If you were given a sedative during your procedure, do not drive or use machines until your doctor says that it is safe. A sedative is a medicine that helps you relax. Do not drink alcohol while taking pain medicine. Ask your doctor if you should avoid driving or using machines while you are taking your medicine. Biopsy site care     Follow instructions from your doctor about how to take care of your cut from surgery (incision) or your puncture site. Make sure you: Wash your hands with soap and water for at least 20 seconds before and after you change your bandage. If you cannot use soap and water, use hand sanitizer. Change your bandage. Leave stitches or skin glue in place for at least 2 weeks. Leave tape strips alone unless you are told to take them off. You may trim the edges of the tape strips if they curl up. If you have stitches, keep them  dry when you take a bath or a shower. Check your cut or puncture site every day for signs of infection. Look for: More redness, swelling, or pain. More fluid or blood. Warmth. Pus or a bad smell. Protect the biopsy site. Do not let the site get bumped. Managing pain If told, put ice on the biopsy site. To do this: Put ice in a plastic bag. Place a towel between your skin and the bag. Leave the ice on for 20 minutes, 2-3 times a day. Take off the ice if your skin turns bright red. This is very important. If you cannot feel pain, heat, or cold, you have a greater risk of damage to the area. Activity If a cut was made in your skin to do the biopsy, avoid activities that could pull your cut  open. These include: Stretching. Reaching over your head. Exercise. Sports. Lifting anything that weighs more than 3 lb (1.4 kg). Return to your normal activities when your doctor says that it is safe. General instructions Follow your normal diet. Wear a good support bra for as long as told by your doctor. Get checked for extra fluid around your lymph nodes (lymphedema) as often as told. Do not smoke or use any products that contain nicotine or tobacco. If you need help quitting, ask your doctor. Keep all follow-up visits. Contact a doctor if: You notice any of these at or near the biopsy site: More redness, swelling, or pain. More fluid or blood. Warmth. Pus or a bad smell. The site breaking open after the stitches or skin tape strips have been removed. You have a rash or a fever. Get help right away if: You have trouble breathing. You have red streaks around the biopsy site. Summary After a breast biopsy, it is common to have bruising, numbness, tingling, or pain near your biopsy site. Ask your doctor if you should avoid driving or using machines while you are taking your medicine. If you had a cut made in your skin to do the biopsy, avoid activities that may pull the cut open. Return to your normal activities when your doctor says that it is safe. Wear a good support bra for as long as told by your doctor. This information is not intended to replace advice given to you by your health care provider. Make sure you discuss any questions you have with your health care provider. Document Revised: 07/31/2021 Document Reviewed: 07/31/2021 Elsevier Patient Education  [place] Anesthesia, Adult, Care After The following information offers guidance on how to care for yourself after your procedure. Your health care provider may also give you more specific instructions. If you have problems or questions, contact your health care provider. What can I expect after the  procedure? After the procedure, it is common for people to: Have pain or discomfort at the IV site. Have nausea or vomiting. Have a sore throat or hoarseness. Have trouble concentrating. Feel cold or chills. Feel weak, sleepy, or tired (fatigue). Have soreness and body aches. These can affect parts of the body that were not involved in surgery. Follow these instructions at home: For the time period you were told by your health care provider:  Rest. Do not participate in activities where you could fall or become injured. Do not drive or use machinery. Do not drink alcohol. Do not take sleeping pills or medicines that cause drowsiness. Do not make important decisions or sign legal documents. Do not take care of children on your own.  General instructions Drink enough fluid to keep your urine pale yellow. If you have sleep apnea, surgery and certain medicines can increase your risk for breathing problems. Follow instructions from your health care provider about wearing your sleep device: Anytime you are sleeping, including during daytime naps. While taking prescription pain medicines, sleeping medicines, or medicines that make you drowsy. Return to your normal activities as told by your health care provider. Ask your health care provider what activities are safe for you. Take over-the-counter and prescription medicines only as told by your health care provider. Do not use any products that contain nicotine or tobacco. These products include cigarettes, chewing tobacco, and vaping devices, such as e-cigarettes. These can delay incision healing after surgery. If you need help quitting, ask your health care provider. Contact a health care provider if: You have nausea or vomiting that does not get better with medicine. You vomit every time you eat or drink. You have pain that does not get better with medicine. You cannot urinate or have bloody urine. You develop a skin rash. You have a  fever. Get help right away if: You have trouble breathing. You have chest pain. You vomit blood. These symptoms may be an emergency. Get help right away. Call 911. Do not wait to see if the symptoms will go away. Do not drive yourself to the hospital. Summary After the procedure, it is common to have a sore throat, hoarseness, nausea, vomiting, or to feel weak, sleepy, or fatigue. For the time period you were told by your health care provider, do not drive or use machinery. Get help right away if you have difficulty breathing, have chest pain, or vomit blood. These symptoms may be an emergency. This information is not intended to replace advice given to you by your health care provider. Make sure you discuss any questions you have with your health care provider. Document Revised: 01/03/2022 Document Reviewed: 01/03/2022 Elsevier Patient Education  [place]. How to Use Chlorhexidine Before Surgery Chlorhexidine gluconate (CHG) is a germ-killing (antiseptic) solution that is used to clean the skin. It can get rid of the bacteria that normally live on the skin and can keep them away for about 24 hours. To clean your skin with CHG, you may be given: A CHG solution to use in the shower or as part of a sponge bath. A prepackaged cloth that contains CHG. Cleaning your skin with CHG may help lower the risk for infection: While you are staying in the intensive care unit of the hospital. If you have a vascular access, such as a central line, to provide short-term or long-term access to your veins. If you have a catheter to drain urine from your bladder. If you are on a ventilator. A ventilator is a machine that helps you breathe by moving air in and out of your lungs. After surgery. What are the risks? Risks of using CHG include: A skin reaction. Hearing loss, if CHG gets in your ears and you have a perforated eardrum. Eye injury, if CHG gets in your eyes and is not rinsed out. The CHG  product catching fire. Make sure that you avoid smoking and flames after applying CHG to your skin. Do not use CHG: If you have a chlorhexidine allergy or have previously reacted to chlorhexidine. On babies younger than 29 months of age. How to use CHG solution Use CHG only as told by your health care provider, and follow the instructions on the label. Use the full  amount of CHG as directed. Usually, this is one bottle. During a shower Follow these steps when using CHG solution during a shower (unless your health care provider gives you different instructions): Start the shower. Use your normal soap and shampoo to wash your face and hair. Turn off the shower or move out of the shower stream. Pour the CHG onto a clean washcloth. Do not use any type of brush or rough-edged sponge. Starting at your neck, lather your body down to your toes. Make sure you follow these instructions: If you will be having surgery, pay special attention to the part of your body where you will be having surgery. Scrub this area for at least 1 minute. Do not use CHG on your head or face. If the solution gets into your ears or eyes, rinse them well with water. Avoid your genital area. Avoid any areas of skin that have broken skin, cuts, or scrapes. Scrub your back and under your arms. Make sure to wash skin folds. Let the lather sit on your skin for 1-2 minutes or as long as told by your health care provider. Thoroughly rinse your entire body in the shower. Make sure that all body creases and crevices are rinsed well. Dry off with a clean towel. Do not put any substances on your body afterward--such as powder, lotion, or perfume--unless you are told to do so by your health care provider. Only use lotions that are recommended by the manufacturer. Put on clean clothes or pajamas. If it is the night before your surgery, sleep in clean sheets.  During a sponge bath Follow these steps when using CHG solution during a  sponge bath (unless your health care provider gives you different instructions): Use your normal soap and shampoo to wash your face and hair. Pour the CHG onto a clean washcloth. Starting at your neck, lather your body down to your toes. Make sure you follow these instructions: If you will be having surgery, pay special attention to the part of your body where you will be having surgery. Scrub this area for at least 1 minute. Do not use CHG on your head or face. If the solution gets into your ears or eyes, rinse them well with water. Avoid your genital area. Avoid any areas of skin that have broken skin, cuts, or scrapes. Scrub your back and under your arms. Make sure to wash skin folds. Let the lather sit on your skin for 1-2 minutes or as long as told by your health care provider. Using a different clean, wet washcloth, thoroughly rinse your entire body. Make sure that all body creases and crevices are rinsed well. Dry off with a clean towel. Do not put any substances on your body afterward--such as powder, lotion, or perfume--unless you are told to do so by your health care provider. Only use lotions that are recommended by the manufacturer. Put on clean clothes or pajamas. If it is the night before your surgery, sleep in clean sheets. How to use CHG prepackaged cloths Only use CHG cloths as told by your health care provider, and follow the instructions on the label. Use the CHG cloth on clean, dry skin. Do not use the CHG cloth on your head or face unless your health care provider tells you to. When washing with the CHG cloth: Avoid your genital area. Avoid any areas of skin that have broken skin, cuts, or scrapes. Before surgery Follow these steps when using a CHG cloth to clean before surgery (  unless your health care provider gives you different instructions): Using the CHG cloth, vigorously scrub the part of your body where you will be having surgery. Scrub using a back-and-forth motion  for 3 minutes. The area on your body should be completely wet with CHG when you are done scrubbing. Do not rinse. Discard the cloth and let the area air-dry. Do not put any substances on the area afterward, such as powder, lotion, or perfume. Put on clean clothes or pajamas. If it is the night before your surgery, sleep in clean sheets.  For general bathing Follow these steps when using CHG cloths for general bathing (unless your health care provider gives you different instructions). Use a separate CHG cloth for each area of your body. Make sure you wash between any folds of skin and between your fingers and toes. Wash your body in the following order, switching to a new cloth after each step: The front of your neck, shoulders, and chest. Both of your arms, under your arms, and your hands. Your stomach and groin area, avoiding the genitals. Your right leg and foot. Your left leg and foot. The back of your neck, your back, and your buttocks. Do not rinse. Discard the cloth and let the area air-dry. Do not put any substances on your body afterward--such as powder, lotion, or perfume--unless you are told to do so by your health care provider. Only use lotions that are recommended by the manufacturer. Put on clean clothes or pajamas. Contact a health care provider if: Your skin gets irritated after scrubbing. You have questions about using your solution or cloth. You swallow any chlorhexidine. Call your local poison control center (1-(914)687-6761 in the [place] Get help right away if: Your eyes itch badly, or they become very red or swollen. Your skin itches badly and is red or swollen. Your hearing changes. You have trouble seeing. You have swelling or tingling in your mouth or throat. You have trouble breathing. These symptoms may represent a serious problem that is an emergency. Do not wait to see if the symptoms will go away. Get medical help right away. Call your local emergency services  (911 in the [place] Do not drive yourself to the hospital. Summary Chlorhexidine gluconate (CHG) is a germ-killing (antiseptic) solution that is used to clean the skin. Cleaning your skin with CHG may help to lower your risk for infection. You may be given CHG to use for bathing. It may be in a bottle or in a prepackaged cloth to use on your skin. Carefully follow your health care provider's instructions and the instructions on the product label. Do not use CHG if you have a chlorhexidine allergy. Contact your health care provider if your skin gets irritated after scrubbing. This information is not intended to replace advice given to you by your health care provider. Make sure you discuss any questions you have with your health care provider. Document Revised: 02/03/2022 Document Reviewed: 12/17/2020 Elsevier Patient Education  [place].

## 2022-10-27 ENCOUNTER — Encounter (HOSPITAL_COMMUNITY): Payer: Self-pay

## 2022-10-27 ENCOUNTER — Encounter (HOSPITAL_COMMUNITY)
Admission: RE | Admit: 2022-10-27 | Discharge: 2022-10-27 | Disposition: A | Discharge status: Home / Self Care | Payer: BC Managed Care – PPO | Source: Ambulatory Visit | Attending: Surgery | Admitting: Surgery

## 2022-10-27 VITALS — BP 121/73 | HR 76 | Temp 97.8°F | Resp 18 | Ht 63.0 in | Wt 256.0 lb

## 2022-10-27 DIAGNOSIS — Z794 Long term (current) use of insulin: Secondary | ICD-10-CM | POA: Diagnosis not present

## 2022-10-27 DIAGNOSIS — I10 Essential (primary) hypertension: Secondary | ICD-10-CM | POA: Diagnosis not present

## 2022-10-27 DIAGNOSIS — K219 Gastro-esophageal reflux disease without esophagitis: Secondary | ICD-10-CM | POA: Diagnosis not present

## 2022-10-27 DIAGNOSIS — R9431 Abnormal electrocardiogram [ECG] [EKG]: Secondary | ICD-10-CM | POA: Insufficient documentation

## 2022-10-27 DIAGNOSIS — Z96652 Presence of left artificial knee joint: Secondary | ICD-10-CM | POA: Diagnosis not present

## 2022-10-27 DIAGNOSIS — Z7985 Long-term (current) use of injectable non-insulin antidiabetic drugs: Secondary | ICD-10-CM | POA: Diagnosis not present

## 2022-10-27 DIAGNOSIS — E119 Type 2 diabetes mellitus without complications: Secondary | ICD-10-CM | POA: Insufficient documentation

## 2022-10-27 DIAGNOSIS — Z01818 Encounter for other preprocedural examination: Secondary | ICD-10-CM | POA: Insufficient documentation

## 2022-10-27 DIAGNOSIS — D242 Benign neoplasm of left breast: Secondary | ICD-10-CM | POA: Diagnosis not present

## 2022-10-27 DIAGNOSIS — N6489 Other specified disorders of breast: Secondary | ICD-10-CM | POA: Diagnosis not present

## 2022-10-27 DIAGNOSIS — G4730 Sleep apnea, unspecified: Secondary | ICD-10-CM | POA: Diagnosis not present

## 2022-10-27 DIAGNOSIS — N6082 Other benign mammary dysplasias of left breast: Secondary | ICD-10-CM | POA: Diagnosis not present

## 2022-10-27 DIAGNOSIS — N6042 Mammary duct ectasia of left breast: Secondary | ICD-10-CM | POA: Diagnosis not present

## 2022-10-27 LAB — BASIC METABOLIC PANEL
Anion gap: 11 (ref 5–15)
BUN: 16 mg/dL (ref 8–23)
CO2: 24 mmol/L (ref 22–32)
Calcium: 9.2 mg/dL (ref 8.9–10.3)
Chloride: 102 mmol/L (ref 98–111)
Creatinine, Ser: 1.21 mg/dL — ABNORMAL HIGH (ref 0.44–1.00)
GFR, Estimated: 50 mL/min — ABNORMAL LOW (ref >60)
Glucose, Bld: 81 mg/dL (ref 70–99)
Potassium: 3.5 mmol/L (ref 3.5–5.1)
Sodium: 137 mmol/L (ref 135–145)

## 2022-10-28 LAB — HEMOGLOBIN A1C
Hgb A1c MFr Bld: 5.8 % — ABNORMAL HIGH (ref 4.8–5.6)
Mean Plasma Glucose: 119.76 mg/dL

## 2022-10-29 ENCOUNTER — Ambulatory Visit (HOSPITAL_COMMUNITY): Payer: BC Managed Care – PPO | Admitting: Certified Registered"

## 2022-10-29 ENCOUNTER — Encounter (HOSPITAL_COMMUNITY): Payer: Self-pay | Admitting: Surgery

## 2022-10-29 ENCOUNTER — Ambulatory Visit (HOSPITAL_COMMUNITY)
Admission: RE | Admit: 2022-10-29 | Discharge: 2022-10-29 | Disposition: A | Discharge status: Home / Self Care | Payer: BC Managed Care – PPO | Attending: Surgery | Admitting: Surgery

## 2022-10-29 ENCOUNTER — Ambulatory Visit (HOSPITAL_COMMUNITY): Payer: BC Managed Care – PPO

## 2022-10-29 ENCOUNTER — Encounter (HOSPITAL_COMMUNITY)
Admission: RE | Disposition: A | Discharge status: Home / Self Care | Payer: Self-pay | Source: Home / Self Care | Attending: Surgery

## 2022-10-29 ENCOUNTER — Other Ambulatory Visit: Payer: Self-pay

## 2022-10-29 DIAGNOSIS — N6042 Mammary duct ectasia of left breast: Secondary | ICD-10-CM | POA: Diagnosis not present

## 2022-10-29 DIAGNOSIS — N6489 Other specified disorders of breast: Secondary | ICD-10-CM | POA: Insufficient documentation

## 2022-10-29 DIAGNOSIS — I10 Essential (primary) hypertension: Secondary | ICD-10-CM | POA: Diagnosis not present

## 2022-10-29 DIAGNOSIS — K219 Gastro-esophageal reflux disease without esophagitis: Secondary | ICD-10-CM | POA: Insufficient documentation

## 2022-10-29 DIAGNOSIS — D369 Benign neoplasm, unspecified site: Secondary | ICD-10-CM

## 2022-10-29 DIAGNOSIS — N62 Hypertrophy of breast: Secondary | ICD-10-CM | POA: Diagnosis not present

## 2022-10-29 DIAGNOSIS — G4730 Sleep apnea, unspecified: Secondary | ICD-10-CM | POA: Insufficient documentation

## 2022-10-29 DIAGNOSIS — R928 Other abnormal and inconclusive findings on diagnostic imaging of breast: Secondary | ICD-10-CM | POA: Diagnosis not present

## 2022-10-29 DIAGNOSIS — E119 Type 2 diabetes mellitus without complications: Secondary | ICD-10-CM | POA: Diagnosis not present

## 2022-10-29 DIAGNOSIS — Z7985 Long-term (current) use of injectable non-insulin antidiabetic drugs: Secondary | ICD-10-CM | POA: Diagnosis not present

## 2022-10-29 DIAGNOSIS — Z96652 Presence of left artificial knee joint: Secondary | ICD-10-CM | POA: Insufficient documentation

## 2022-10-29 DIAGNOSIS — Z794 Long term (current) use of insulin: Secondary | ICD-10-CM | POA: Diagnosis not present

## 2022-10-29 DIAGNOSIS — N6082 Other benign mammary dysplasias of left breast: Secondary | ICD-10-CM | POA: Diagnosis not present

## 2022-10-29 DIAGNOSIS — N6012 Diffuse cystic mastopathy of left breast: Secondary | ICD-10-CM | POA: Diagnosis not present

## 2022-10-29 DIAGNOSIS — D242 Benign neoplasm of left breast: Secondary | ICD-10-CM | POA: Diagnosis not present

## 2022-10-29 HISTORY — PX: BREAST BIOPSY WITH RADIO FREQUENCY LOCALIZER: SHX6895

## 2022-10-29 LAB — GLUCOSE, CAPILLARY
Glucose-Capillary: 108 mg/dL — ABNORMAL HIGH (ref 70–99)
Glucose-Capillary: 105 mg/dL — ABNORMAL HIGH (ref 70–99)

## 2022-10-29 SURGERY — BREAST BIOPSY WITH RADIO FREQUENCY LOCALIZER
Anesthesia: General | Site: Breast | Laterality: Left

## 2022-10-29 MED ORDER — LIDOCAINE HCL (PF) 2 % IJ SOLN
INTRAMUSCULAR | Status: AC
  Filled 2022-10-29: qty 5

## 2022-10-29 MED ORDER — BUPIVACAINE HCL (PF) 0.5 % IJ SOLN
INTRAMUSCULAR | Status: AC
  Filled 2022-10-29: qty 30

## 2022-10-29 MED ORDER — PROPOFOL 10 MG/ML IV BOLUS
INTRAVENOUS | Status: AC
  Filled 2022-10-29: qty 20

## 2022-10-29 MED ORDER — ROCURONIUM BROMIDE 10 MG/ML (PF) SYRINGE
PREFILLED_SYRINGE | INTRAVENOUS | Status: AC
  Filled 2022-10-29: qty 10

## 2022-10-29 MED ORDER — CHLORHEXIDINE GLUCONATE CLOTH 2 % EX PADS: 6.0000 | MEDICATED_PAD | Freq: Once | CUTANEOUS | Status: DC

## 2022-10-29 MED ORDER — MIDAZOLAM HCL 2 MG/2ML IJ SOLN
INTRAMUSCULAR | Status: AC
  Filled 2022-10-29: qty 2

## 2022-10-29 MED ORDER — MIDAZOLAM HCL 5 MG/5ML IJ SOLN
INTRAMUSCULAR | Status: DC | PRN
  Administered 2022-10-29: 2 mg via INTRAVENOUS

## 2022-10-29 MED ORDER — BUPIVACAINE HCL (PF) 0.5 % IJ SOLN
INTRAMUSCULAR | Status: DC | PRN
  Administered 2022-10-29: 30 mL

## 2022-10-29 MED ORDER — SCOPOLAMINE 1 MG/3DAYS TD PT72
MEDICATED_PATCH | TRANSDERMAL | Status: AC
  Filled 2022-10-29: qty 1

## 2022-10-29 MED ORDER — ACETAMINOPHEN 325 MG PO TABS: 1000.0000 mg | ORAL_TABLET | Freq: Four times a day (QID) | ORAL | 0 refills | Status: AC

## 2022-10-29 MED ORDER — SODIUM CHLORIDE 0.9 % IR SOLN
Status: DC | PRN
  Administered 2022-10-29: 1000 mL

## 2022-10-29 MED ORDER — DEXAMETHASONE SODIUM PHOSPHATE 10 MG/ML IJ SOLN
INTRAMUSCULAR | Status: AC
  Filled 2022-10-29: qty 1

## 2022-10-29 MED ORDER — OXYCODONE HCL 5 MG PO TABS: 5.0000 mg | ORAL_TABLET | Freq: Four times a day (QID) | ORAL | 0 refills | Status: DC | PRN

## 2022-10-29 MED ORDER — DEXAMETHASONE SODIUM PHOSPHATE 10 MG/ML IJ SOLN
INTRAMUSCULAR | Status: DC | PRN
  Administered 2022-10-29: 10 mg via INTRAVENOUS

## 2022-10-29 MED ORDER — METHYLENE BLUE 1 % INJ SOLN
INTRAVENOUS | Status: AC
  Filled 2022-10-29: qty 10

## 2022-10-29 MED ORDER — FENTANYL CITRATE PF 50 MCG/ML IJ SOSY
25.0000 ug | PREFILLED_SYRINGE | INTRAMUSCULAR | Status: DC | PRN
  Administered 2022-10-29 (×2): 50 ug via INTRAVENOUS
  Filled 2022-10-29 (×2): qty 1

## 2022-10-29 MED ORDER — FENTANYL CITRATE (PF) 100 MCG/2ML IJ SOLN
INTRAMUSCULAR | Status: DC | PRN
  Administered 2022-10-29 (×4): 25 ug via INTRAVENOUS

## 2022-10-29 MED ORDER — ONDANSETRON HCL 4 MG/2ML IJ SOLN
INTRAMUSCULAR | Status: AC
  Filled 2022-10-29: qty 2

## 2022-10-29 MED ORDER — LACTATED RINGERS IV SOLN: INTRAVENOUS | Status: DC | PRN

## 2022-10-29 MED ORDER — ONDANSETRON HCL 4 MG/2ML IJ SOLN
4.0000 mg | Freq: Once | INTRAMUSCULAR | Status: AC
  Administered 2022-10-29: 4 mg via INTRAVENOUS

## 2022-10-29 MED ORDER — LIDOCAINE 2% (20 MG/ML) 5 ML SYRINGE
INTRAMUSCULAR | Status: DC | PRN
  Administered 2022-10-29: 100 mg via INTRAVENOUS

## 2022-10-29 MED ORDER — OXYCODONE HCL 5 MG/5ML PO SOLN: 5.0000 mg | Freq: Once | ORAL | Status: DC | PRN

## 2022-10-29 MED ORDER — CEFAZOLIN SODIUM-DEXTROSE 2-4 GM/100ML-% IV SOLN
2.0000 g | INTRAVENOUS | Status: AC
  Administered 2022-10-29: 2 g via INTRAVENOUS
  Filled 2022-10-29: qty 100

## 2022-10-29 MED ORDER — PROPOFOL 10 MG/ML IV BOLUS
INTRAVENOUS | Status: DC | PRN
  Administered 2022-10-29: 180 mg via INTRAVENOUS

## 2022-10-29 MED ORDER — DOCUSATE SODIUM 100 MG PO CAPS: 100.0000 mg | ORAL_CAPSULE | Freq: Two times a day (BID) | ORAL | 2 refills | Status: DC

## 2022-10-29 MED ORDER — ONDANSETRON HCL 4 MG/2ML IJ SOLN
4.0000 mg | Freq: Once | INTRAMUSCULAR | Status: AC | PRN
  Administered 2022-10-29: 4 mg via INTRAVENOUS

## 2022-10-29 MED ORDER — OXYCODONE HCL 5 MG PO TABS: 5.0000 mg | ORAL_TABLET | Freq: Once | ORAL | Status: DC | PRN

## 2022-10-29 MED ORDER — FENTANYL CITRATE (PF) 100 MCG/2ML IJ SOLN
INTRAMUSCULAR | Status: AC
  Filled 2022-10-29: qty 2

## 2022-10-29 SURGICAL SUPPLY — 33 items
ADH SKN CLS APL DERMABOND .7 (GAUZE/BANDAGES/DRESSINGS) ×1
APL PRP STRL LF DISP 70% ISPRP (MISCELLANEOUS) ×1
BINDER BREAST XLRG (GAUZE/BANDAGES/DRESSINGS) IMPLANT
BLADE SURG 15 STRL LF DISP TIS (BLADE) ×1 IMPLANT
BLADE SURG 15 STRL SS (BLADE) ×1
CHLORAPREP W/TINT 26 (MISCELLANEOUS) ×1 IMPLANT
CLOTH BEACON ORANGE TIMEOUT ST (SAFETY) ×1 IMPLANT
COVER LIGHT HANDLE STERIS (MISCELLANEOUS) ×2 IMPLANT
DERMABOND ADVANCED .7 DNX12 (GAUZE/BANDAGES/DRESSINGS) IMPLANT
DEVICE DUBIN W/COMP PLATE 8390 (MISCELLANEOUS) ×1 IMPLANT
ELECT REM PT RETURN 9FT ADLT (ELECTROSURGICAL) ×1
ELECTRODE REM PT RTRN 9FT ADLT (ELECTROSURGICAL) ×1 IMPLANT
GAUZE 4X4 16PLY ~~LOC~~+RFID DBL (SPONGE) IMPLANT
GLOVE BIO SURGEON STRL SZ 6.5 (GLOVE) IMPLANT
GLOVE BIOGEL PI IND STRL 6.5 (GLOVE) ×1 IMPLANT
GLOVE BIOGEL PI IND STRL 7.0 (GLOVE) ×2 IMPLANT
GOWN STRL REUS W/TWL LRG LVL3 (GOWN DISPOSABLE) ×3 IMPLANT
KIT MARKER MARGIN INK (KITS) ×1 IMPLANT
KIT TURNOVER KIT A (KITS) ×1 IMPLANT
MANIFOLD NEPTUNE II (INSTRUMENTS) ×1 IMPLANT
NDL HYPO 25X1 1.5 SAFETY (NEEDLE) ×1 IMPLANT
NEEDLE HYPO 25X1 1.5 SAFETY (NEEDLE) ×1 IMPLANT
NS IRRIG 1000ML POUR BTL (IV SOLUTION) ×1 IMPLANT
PACK MINOR (CUSTOM PROCEDURE TRAY) ×1 IMPLANT
PAD ABD 5X9 TENDERSORB (GAUZE/BANDAGES/DRESSINGS) IMPLANT
PAD ARMBOARD 7.5X6 YLW CONV (MISCELLANEOUS) ×1 IMPLANT
SET BASIN LINEN APH (SET/KITS/TRAYS/PACK) ×1 IMPLANT
SET LOCALIZER 20 PROBE US (MISCELLANEOUS) ×1 IMPLANT
SUT MNCRL AB 4-0 PS2 18 (SUTURE) ×1 IMPLANT
SUT SILK 2 0 SH (SUTURE) ×1 IMPLANT
SUT VIC AB 3-0 SH 27 (SUTURE) ×2
SUT VIC AB 3-0 SH 27X BRD (SUTURE) ×1 IMPLANT
SYR CONTROL 10ML LL (SYRINGE) ×1 IMPLANT

## 2022-10-29 NOTE — Transfer of Care (Signed)
Immediate Anesthesia Transfer of Care Note  Patient: Rhonda Patton  Procedure(s) Performed: BREAST BIOPSY WITH RADIO FREQUENCY LOCALIZER (Left: Breast)  Patient Location: PACU  Anesthesia Type:General  Level of Consciousness: awake, alert , oriented, and patient cooperative  Airway & Oxygen Therapy: Patient Spontanous Breathing and Patient connected to face mask oxygen  Post-op Assessment: Report given to RN, Post -op Vital signs reviewed and stable, and Patient moving all extremities  Post vital signs: Reviewed and stable  Last Vitals:  Vitals Value Taken Time  BP 129/82 10/29/22 0930  Temp 36.5 C 10/29/22 0917  Pulse 86 10/29/22 0930  Resp 21 10/29/22 0930  SpO2 100 % 10/29/22 0930  Vitals shown include unvalidated device data.  Last Pain:  Vitals:   10/29/22 0917  TempSrc:   PainSc: Asleep         Complications: No notable events documented.

## 2022-10-29 NOTE — Anesthesia Preprocedure Evaluation (Signed)
Anesthesia Evaluation  Patient identified by MRN, date of birth, ID band Patient awake    Reviewed: Allergy & Precautions, H&P , NPO status , Patient's Chart, lab work & pertinent test results, reviewed documented beta blocker date and time   History of Anesthesia Complications (+) PONV and history of anesthetic complications  Airway Mallampati: II  TM Distance: >3 FB Neck ROM: full    Dental no notable dental hx.    Pulmonary neg pulmonary ROS, shortness of breath, sleep apnea    Pulmonary exam normal breath sounds clear to auscultation       Cardiovascular Exercise Tolerance: Good hypertension, negative cardio ROS  Rhythm:regular Rate:Normal     Neuro/Psych  Headaches  Neuromuscular disease negative neurological ROS  negative psych ROS   GI/Hepatic negative GI ROS, Neg liver ROS,GERD  ,,  Endo/Other  negative endocrine ROSdiabetes, Type 2    Renal/GU Renal diseasenegative Renal ROS  negative genitourinary   Musculoskeletal   Abdominal   Peds  Hematology negative hematology ROS (+)   Anesthesia Other Findings   Reproductive/Obstetrics negative OB ROS                             Anesthesia Physical Anesthesia Plan  ASA: 3  Anesthesia Plan: General and General LMA   Post-op Pain Management:    Induction:   PONV Risk Score and Plan: Ondansetron  Airway Management Planned:   Additional Equipment:   Intra-op Plan:   Post-operative Plan:   Informed Consent: I have reviewed the patients History and Physical, chart, labs and discussed the procedure including the risks, benefits and alternatives for the proposed anesthesia with the patient or authorized representative who has indicated his/her understanding and acceptance.     Dental Advisory Given  Plan Discussed with: CRNA  Anesthesia Plan Comments:        Anesthesia Quick Evaluation

## 2022-10-29 NOTE — Interval H&P Note (Signed)
History and Physical Interval Note:  10/29/2022 7:25 AM  Rhonda Patton  has presented today for surgery, with the diagnosis of INTRADUCTAL PAPILLOMA, LEFT BREAST.  The various methods of treatment have been discussed with the patient and family. After consideration of risks, benefits and other options for treatment, the patient has consented to  Procedure(s): BREAST BIOPSY WITH RADIO FREQUENCY LOCALIZER (Left) as a surgical intervention.  The patient's history has been reviewed, patient examined, no change in status, stable for surgery.  I have reviewed the patient's chart and labs.  Questions were answered to the patient's satisfaction.     [place]

## 2022-10-29 NOTE — Discharge Instructions (Signed)
Ambulatory Surgery Discharge Instructions  General Anesthesia or Sedation Do not drive or operate heavy machinery for 24 hours.  Do not consume alcohol, tranquilizers, sleeping medications, or any non-prescribed medications for 24 hours. Do not make important decisions or sign any important papers in the next 24 hours. You should have someone with you tonight at home.  Activity  You are advised to go directly home from the hospital.  Restrict your activities and rest for a day.  Resume light activity tomorrow. No heavy lifting over 10 lbs or strenuous exercise. Wear a compression bra (sports bra or wrap from hospital) at all times except while bathing for the first week after surgery.  Fluids and Diet Begin with clear liquids, bouillon, dry toast, soda crackers.  If not nauseated, you may go to a regular diet when you desire.  Greasy and spicy foods are not advised.  Medications  If you have not had a bowel movement in 24 hours, take 2 tablespoons over the counter Milk of mag.             You May resume your blood thinners tomorrow (Aspirin, coumadin, or other).  You are being discharged with prescriptions for Opioid/Narcotic Medications: There are some specific considerations for these medications that you should know. Opioid Meds have risks & benefits. Addiction to these meds is always a concern with prolonged use Take medication only as directed Do not drive while taking narcotic pain medication Do not crush tablets or capsules Do not use a different container than medication was dispensed in Lock the container of medication in a cool, dry place out of reach of children and pets. Opioid medication can cause addiction Do not share with anyone else (this is a felony) Do not store medications for future use. Dispose of them properly.     Disposal:  Find a [employer] household drug take back site near you.  If you can't get to a drug take back site, use the recipe below as a last  resort to dispose of expired, unused or unwanted drugs. Disposal  (Do not dispose chemotherapy drugs this way, talk to your prescribing doctor instead.) Step 1: Mix drugs (do not crush) with dirt, kitty litter, or used coffee grounds and add a small amount of water to dissolve any solid medications. Step 2: Seal drugs in plastic bag. Step 3: Place plastic bag in trash. Step 4: Take prescription container and scratch out personal information, then recycle or throw away.  Operative Site  You have a liquid bandage over your incisions, this will begin to flake off in about a week. Ok to Games developer. Keep wound clean and dry. No baths or swimming. No lifting more than 10 pounds.  Contact Information: If you have questions or concerns, please call our office, [phone], Monday- Thursday 8AM-5PM and Friday 8AM-12Noon.  If it is after hours or on the weekend, please call [facility] Main Number, [phone], and ask to speak to the surgeon on call for Dr. Okey Dupre at [facility].   SPECIFIC COMPLICATIONS TO WATCH FOR: Inability to urinate Fever over 101? F by mouth Nausea and vomiting lasting longer than 24 hours. Pain not relieved by medication ordered Swelling around the operative site Increased redness, warmth, hardness, around operative area Numbness, tingling, or cold fingers or toes Blood -soaked dressing, (small amounts of oozing may be normal) Increasing and progressive drainage from surgical area or exam site

## 2022-10-29 NOTE — Op Note (Signed)
[facility] Operative Note  10/29/22  Preoperative Diagnosis: Intraductal papilloma   Postoperative Diagnosis: Same   Procedure(s) Performed: Excisional biopsy of left breast lesion with radiofrequency localizer   Surgeon: Graciella Freer, DO    Assistants: Annia Belt, MS3   Anesthesia: General endotracheal   Anesthesiologist: Louann Sjogren, MD    Specimens: Left breast mass   Estimated Blood Loss: Minimal   Blood Replacement: None    Complications: None   Wound Class: Clean   Operative Indications: Patient is a 62 year old female who presents for left excisional breast biopsy secondary to intraductal papilloma noted on mammography and core needle biopsy.  Patient is agreeable to surgery.  All risks and benefits of performing this procedure were discussed with the patient including pain, infection, bleeding, damage to the surrounding structures, and need for more procedures or surgery. The patient voiced understanding of the procedure, all questions were sought and answered, and consent was obtained.  Findings: Left breast specimen containing both radiofrequency tag and biopsy clip   Procedure: The patient was taken to the operating room and placed supine. General endotracheal anesthesia was induced. Intravenous antibiotics were administered per protocol. Preoperative localization was performed by the department of radiology via a Faxitron clip.  The left chest, breast, and axilla were prepared and draped in the usual sterile fashion.  Timeout was performed.  The Faxitron probe was used to help determine the best location for incision, and a curvilinear incision along the superior edge of the nipple was made and flaps were raised.  The Faxitron probe was placed within the depths of the wounds and upon dissection, the clip was identified.  A ball of tissue was taken surrounding this area.  The probe was then used to demonstrate that the clip was removed  with the specimen.  The specimen was oriented using our painting system.  The cement was first sent to radiology, and both the radiofrequency tag and clip were noted to be within the specimen.  This specimen was then sent to pathology for evaluation.  Electrocautery was used to achieve hemostasis.  The cavity was localized. Hemostasis was again noted.  The subcutaneous tissue was reapproximated with interrupted 3-0 Vicryl sutures in the dermis, followed by skin closure with a subcuticular 4-0 monocryl suture. Dermabond was placed over the incision.  A compression bra was placed.  Final inspection revealed acceptable hemostasis. All counts were correct at the end of the case. The patient was awakened from anesthesia and extubated without complication.  The patient went to the PACU in stable condition.   Graciella Freer, DO  [facility] [address] [place], [place] [address] [phone] (office)

## 2022-10-29 NOTE — Progress Notes (Signed)
Patient was sitting on the side of the bed to transfer to wheelchair to go to Phase II. She stated I am starting to feel nauseated. Verbal order obtained from Dr Adalberto Ill for zofran 4 mg once. Zofran given IV. Patient is eating peanut butter crackers and drinking ginger ale. States nausea has gone.

## 2022-10-29 NOTE — Progress Notes (Signed)
[facility]  Spoke with the patient's husband and daughter in the consultation room.  I explained that she tolerated the procedure without difficulty.  She has dissolvable stitches under the skin with overlying skin glue.  This will flake off in 10 to 14 days.  I discharged her home with a prescription for narcotic pain medication that they should take as needed for pain.  I also want her taking scheduled Tylenol.  If they take the narcotic pain medication, they should take a stool softener as well.  The patient will follow-up with me in 2 weeks.  All questions were answered to their expressed satisfaction.  Graciella Freer, DO [facility] [address] [place], [place] [address] [phone] (office)

## 2022-10-29 NOTE — Anesthesia Procedure Notes (Signed)
Procedure Name: LMA Insertion Date/Time: 10/29/2022 7:38 AM  Performed by: Myna Bright, CRNAPre-anesthesia Checklist: Patient identified, Emergency Drugs available, Suction available and Patient being monitored Patient Re-evaluated:Patient Re-evaluated prior to induction Oxygen Delivery Method: Circle system utilized Preoxygenation: Pre-oxygenation with 100% oxygen Induction Type: IV induction Ventilation: Mask ventilation without difficulty LMA: LMA inserted LMA Size: 4.0 Tube type: Oral Number of attempts: 1 Placement Confirmation: positive ETCO2 and breath sounds checked- equal and bilateral Tube secured with: Tape Dental Injury: Teeth and Oropharynx as per pre-operative assessment

## 2022-10-31 LAB — SURGICAL PATHOLOGY

## 2022-11-01 NOTE — Anesthesia Postprocedure Evaluation (Signed)
Anesthesia Post Note  Patient: Rhonda Patton  Procedure(s) Performed: BREAST BIOPSY WITH RADIO FREQUENCY LOCALIZER (Left: Breast)  Patient location during evaluation: Phase II Anesthesia Type: General Level of consciousness: awake Pain management: pain level controlled Vital Signs Assessment: post-procedure vital signs reviewed and stable Respiratory status: spontaneous breathing and respiratory function stable Cardiovascular status: blood pressure returned to baseline and stable Postop Assessment: no headache and no apparent nausea or vomiting Anesthetic complications: no Comments: Late entry   No notable events documented.   Last Vitals:  Vitals:   10/29/22 1038 10/29/22 1054  BP: 134/78 114/65  Pulse: 80 73  Resp: 16 16  Temp:  36.5 C  SpO2: 96% 98%    Last Pain:  Vitals:   10/30/22 1145  TempSrc:   PainSc: [place]

## 2022-11-03 ENCOUNTER — Telehealth (INDEPENDENT_AMBULATORY_CARE_PROVIDER_SITE_OTHER): Payer: BC Managed Care – PPO | Admitting: Surgery

## 2022-11-03 DIAGNOSIS — D242 Benign neoplasm of left breast: Secondary | ICD-10-CM

## 2022-11-03 NOTE — Telephone Encounter (Signed)
[facility]  Called to speak with the patient regarding her surgical pathology results.  I explained that her pathology demonstrates intraductal papilloma, which is what we were expecting.  She has some pain at her surgical site, but is otherwise doing well.  She has no other complaints at this time.  She will follow-up with me on 1/23 for postoperative follow-up appointment.  All questions were answered to her expressed satisfaction.  Pathology: A. BREAST, LEFT, LUMPECTOMY:  - Residual intraductal papilloma  - Biopsy site changes  - Fibrocystic change with ectatic ducts, usual ductal hyperplasia and  apocrine metaplasia  - Negative for carcinoma   Graciella Freer, DO [facility] [address] [place], [place] [address] [phone] (office)

## 2022-11-04 ENCOUNTER — Encounter (HOSPITAL_COMMUNITY): Payer: Self-pay | Admitting: Surgery

## 2022-11-04 NOTE — Addendum Note (Signed)
Addendum  created 11/04/22 1233 by Ollen Bowl, CRNA   Intraprocedure Staff edited

## 2022-11-11 ENCOUNTER — Encounter: Payer: Self-pay | Admitting: Surgery

## 2022-11-11 ENCOUNTER — Ambulatory Visit (INDEPENDENT_AMBULATORY_CARE_PROVIDER_SITE_OTHER): Payer: BC Managed Care – PPO | Admitting: Surgery

## 2022-11-11 VITALS — BP 127/79 | HR 70 | Temp 98.1°F | Resp 14 | Ht 63.0 in | Wt 256.0 lb

## 2022-11-11 DIAGNOSIS — Z09 Encounter for follow-up examination after completed treatment for conditions other than malignant neoplasm: Secondary | ICD-10-CM

## 2022-11-11 MED ORDER — OXYCODONE HCL 5 MG PO TABS: 5.0000 mg | ORAL_TABLET | Freq: Four times a day (QID) | ORAL | 0 refills | Status: DC | PRN

## 2022-11-11 NOTE — Progress Notes (Signed)
[facility] Note   HPI:  62 y.o. Female presents to clinic for post-op follow-up status post left breast excisional biopsy on 1/10.  She states that she has been doing well since surgery, though she still has pain at her surgical site.  She had a small amount of light pink drainage.  She denies any fevers or chills.  She otherwise is doing well.  Review of Systems:  All other review of systems: otherwise negative   Vital Signs:  BP 127/79   Pulse 70   Temp 98.1 F (36.7 C) (Oral)   Resp 14   Ht '5\' 3"'$  (1.6 m)   Wt 256 lb (116.1 kg)   SpO2 98%   BMI 45.35 kg/m    Physical Exam:  Physical Exam Vitals reviewed.  Constitutional:      Appearance: Normal appearance.  Chest:     Comments: Left breast surgical site healing well with Dermabond still in place, minimal ecchymosis, no erythema or induration Neurological:     Mental Status: She is alert.     Laboratory studies: None   Imaging:  None  Pathology:  A. BREAST, LEFT, LUMPECTOMY:  - Residual intraductal papilloma  - Biopsy site changes  - Fibrocystic change with ectatic ducts, usual ductal hyperplasia and  apocrine metaplasia  - Negative for carcinoma   Assessment:  62 y.o. yo Female who presents for follow-up status post left breast excisional biopsy on 1/10.  Plan:  -Patient overall doing well.  She does complain of persistent pain at her surgical site -Refill of her oxycodone provided to the patient -Advised her to apply antibiotic ointment at the incision site -We discussed her pathology and I reassured her that her incision is healing well without any evidence of significant complication -Call in the next 2 weeks if she still is having persistent pain at her surgical site  All of the above recommendations were discussed with the patient and patient's family, and all of patient's and family's questions were answered to their expressed satisfaction.  Graciella Freer, DO [facility] [address] [place], [place] [address] [phone] (office)

## 2022-11-17 ENCOUNTER — Other Ambulatory Visit (HOSPITAL_BASED_OUTPATIENT_CLINIC_OR_DEPARTMENT_OTHER): Payer: Self-pay

## 2022-11-17 DIAGNOSIS — R5383 Other fatigue: Secondary | ICD-10-CM

## 2022-12-02 DIAGNOSIS — I10 Essential (primary) hypertension: Secondary | ICD-10-CM | POA: Diagnosis not present

## 2022-12-02 DIAGNOSIS — R5383 Other fatigue: Secondary | ICD-10-CM | POA: Diagnosis not present

## 2022-12-02 DIAGNOSIS — E1165 Type 2 diabetes mellitus with hyperglycemia: Secondary | ICD-10-CM | POA: Diagnosis not present

## 2022-12-08 DIAGNOSIS — I129 Hypertensive chronic kidney disease with stage 1 through stage 4 chronic kidney disease, or unspecified chronic kidney disease: Secondary | ICD-10-CM | POA: Diagnosis not present

## 2022-12-08 DIAGNOSIS — E1165 Type 2 diabetes mellitus with hyperglycemia: Secondary | ICD-10-CM | POA: Diagnosis not present

## 2022-12-08 DIAGNOSIS — I10 Essential (primary) hypertension: Secondary | ICD-10-CM | POA: Diagnosis not present

## 2022-12-08 DIAGNOSIS — E1122 Type 2 diabetes mellitus with diabetic chronic kidney disease: Secondary | ICD-10-CM | POA: Diagnosis not present

## 2022-12-08 DIAGNOSIS — E785 Hyperlipidemia, unspecified: Secondary | ICD-10-CM | POA: Diagnosis not present

## 2022-12-08 DIAGNOSIS — N189 Chronic kidney disease, unspecified: Secondary | ICD-10-CM | POA: Diagnosis not present

## 2022-12-12 ENCOUNTER — Ambulatory Visit: Payer: BC Managed Care – PPO | Attending: Family Medicine | Admitting: Pulmonary Disease

## 2022-12-12 DIAGNOSIS — R5383 Other fatigue: Secondary | ICD-10-CM | POA: Diagnosis not present

## 2022-12-13 ENCOUNTER — Other Ambulatory Visit: Payer: Self-pay | Admitting: Gastroenterology

## 2022-12-18 ENCOUNTER — Encounter: Payer: Self-pay | Admitting: Radiology

## 2022-12-24 DIAGNOSIS — R5383 Other fatigue: Secondary | ICD-10-CM | POA: Diagnosis not present

## 2022-12-24 NOTE — Procedures (Signed)
      Patient Name: Rhonda Patton, [place] Date: 12/12/2022 Gender: Female D.O.B: 1961-08-08 Age (years): 51 Referring Provider: Cecile Sheerer NP Height (inches): 15 Interpreting Physician: Chesley Mires MD, ABSM Weight (lbs): 256 RPSGT: Rosebud Poles BMI: 45 MRN: [id number]  CLINICAL INFORMATION Sleep Study Type: HST  Indication for sleep study: snoring, sleep disruption.  SLEEP STUDY TECHNIQUE A multi-channel overnight portable sleep study was performed. The channels recorded were: nasal airflow, thoracic respiratory movement, and oxygen saturation with a pulse oximetry. Snoring was also monitored.  MEDICATIONS Patient self administered medications include: N/A.  SLEEP ARCHITECTURE Patient was studied for 404.4 minutes. The sleep efficiency was 84.3 % and the patient was supine for 0%. The arousal index was 0.0 per hour.  RESPIRATORY PARAMETERS The overall AHI was 1.3 per hour, with a central apnea index of 0 per hour.  The oxygen nadir was 88% during sleep.  CARDIAC DATA Mean heart rate during sleep was 59.1 bpm.  IMPRESSIONS - No significant obstructive sleep apnea occurred during this study (AHI = 1.3/h). - Mild oxygen desaturation was noted during this study (Min O2 = 88%). - No snoring was audible during this study.  DIAGNOSIS - Normal study  RECOMMENDATIONS - Avoid alcohol, sedatives and other CNS depressants that may worsen sleep apnea and disrupt normal sleep architecture. - Sleep hygiene should be reviewed to assess factors that may improve sleep quality. - Weight management and regular exercise should be initiated or continued. - Patient may benefit from in-lab study if there is still significant concern for obstructive sleep apnea.  [Electronically signed] 12/24/2022 08:14 AM  Chesley Mires MD, ABSM Diplomate, American Board of Sleep Medicine NPI: [id number]  [place] PH: [phone]   FX: [phone] [place]

## 2023-01-01 ENCOUNTER — Encounter (HOSPITAL_COMMUNITY): Admission: RE | Admit: 2023-01-01 | Payer: BC Managed Care – PPO | Source: Ambulatory Visit

## 2023-02-17 DIAGNOSIS — Z7984 Long term (current) use of oral hypoglycemic drugs: Secondary | ICD-10-CM | POA: Diagnosis not present

## 2023-02-17 DIAGNOSIS — H2511 Age-related nuclear cataract, right eye: Secondary | ICD-10-CM | POA: Diagnosis not present

## 2023-02-17 DIAGNOSIS — Z961 Presence of intraocular lens: Secondary | ICD-10-CM | POA: Diagnosis not present

## 2023-02-17 DIAGNOSIS — E119 Type 2 diabetes mellitus without complications: Secondary | ICD-10-CM | POA: Diagnosis not present

## 2023-02-24 DIAGNOSIS — H2511 Age-related nuclear cataract, right eye: Secondary | ICD-10-CM | POA: Diagnosis not present

## 2023-02-24 DIAGNOSIS — E119 Type 2 diabetes mellitus without complications: Secondary | ICD-10-CM | POA: Diagnosis not present

## 2023-03-06 DIAGNOSIS — I10 Essential (primary) hypertension: Secondary | ICD-10-CM | POA: Diagnosis not present

## 2023-03-06 DIAGNOSIS — E559 Vitamin D deficiency, unspecified: Secondary | ICD-10-CM | POA: Diagnosis not present

## 2023-03-06 DIAGNOSIS — E1165 Type 2 diabetes mellitus with hyperglycemia: Secondary | ICD-10-CM | POA: Diagnosis not present

## 2023-03-10 DIAGNOSIS — I129 Hypertensive chronic kidney disease with stage 1 through stage 4 chronic kidney disease, or unspecified chronic kidney disease: Secondary | ICD-10-CM | POA: Diagnosis not present

## 2023-03-10 DIAGNOSIS — E785 Hyperlipidemia, unspecified: Secondary | ICD-10-CM | POA: Diagnosis not present

## 2023-03-10 DIAGNOSIS — E1122 Type 2 diabetes mellitus with diabetic chronic kidney disease: Secondary | ICD-10-CM | POA: Diagnosis not present

## 2023-03-10 DIAGNOSIS — I10 Essential (primary) hypertension: Secondary | ICD-10-CM | POA: Diagnosis not present

## 2023-03-10 DIAGNOSIS — E1165 Type 2 diabetes mellitus with hyperglycemia: Secondary | ICD-10-CM | POA: Diagnosis not present

## 2023-03-10 DIAGNOSIS — N189 Chronic kidney disease, unspecified: Secondary | ICD-10-CM | POA: Diagnosis not present

## 2023-03-12 DIAGNOSIS — M1711 Unilateral primary osteoarthritis, right knee: Secondary | ICD-10-CM | POA: Diagnosis not present

## 2023-03-17 DIAGNOSIS — M7541 Impingement syndrome of right shoulder: Secondary | ICD-10-CM | POA: Diagnosis not present

## 2023-03-18 ENCOUNTER — Encounter (HOSPITAL_COMMUNITY): Payer: Self-pay

## 2023-03-27 ENCOUNTER — Encounter (HOSPITAL_COMMUNITY): Admission: RE | Admit: 2023-03-27 | Payer: BC Managed Care – PPO | Source: Ambulatory Visit

## 2023-04-24 DIAGNOSIS — H2511 Age-related nuclear cataract, right eye: Secondary | ICD-10-CM | POA: Diagnosis not present

## 2023-04-27 ENCOUNTER — Encounter (HOSPITAL_COMMUNITY): Payer: Self-pay

## 2023-04-27 ENCOUNTER — Encounter (HOSPITAL_COMMUNITY)
Admission: RE | Admit: 2023-04-27 | Discharge: 2023-04-27 | Disposition: A | Discharge status: Home / Self Care | Payer: BC Managed Care – PPO | Source: Ambulatory Visit | Attending: Optometry | Admitting: Optometry

## 2023-04-27 NOTE — H&P (Signed)
Surgical History & Physical  Patient Name: Rhonda Patton  DOB: 1961-05-08  Surgery: Cataract extraction with intraocular lens implant phacoemulsification; Right Eye Surgeon: Pecolia Ades MD Surgery Date: 05/03/2023 Pre-Op Date: 02/24/2023  HPI: A 89 Yr. old female patient present for cataract eval per Dr. Daphine Deutscher. 1. The patient complains of headlights glare causing poor vision, which began 2 years ago. The right eye is affected. The episode is constant. The condition's severity is worsening. This is negatively affecting the patient's quality of life and the patient is unable to function adequately in life with the current level of vision.  Medical History: Cataracts  Diabetes High Blood Pressure LDL  Review of Systems Cardiovascular High Blood Pressure Endocrine diabetic Neurological weakness All recorded systems are negative except as noted above.  Social Never smoked   Medication Atorvastatin, Famotidine, Hydrochlorothiazide, Oxycodone, Pantoprazole, Mounjaro  Sx/Procedures Phaco c IOL OS,  Breast Biopsy, Knee Replacement  Drug Allergies  Codeine, Aspirin   History & Physical: Heent: cataract OD, PCL OS NECK: supple without bruits LUNGS: lungs clear to auscultation CV: regular rate and rhythm Abdomen: soft and non-tender  Impression & Plan: Assessment: 1.  CATARACT AGE-RELATED NUCLEAR; Right Eye (H25.11) 2.  Diabetes Type 2 No retinopathy; Both Without ME (E11.9) 3.  Hyperopia ; Right Eye (H52.01)  Plan: 1.  Cataract is visually significant and account for the patient's complaints. Discussed all risks, benefits, procedures and recovery, including infection, loss of vision and eye, need for glasses after surgery or additional procedures. Patient understands changing glasses will not improve vision. Patient indicated understanding of procedure. All questions answered. Patient desires to have surgery, recommend phacoemulsification with intraocular lens.  Patient to have preliminary testing necessary (Argos/IOL Master, Mac OCT, TOPO) Educational materials provided:Cataract.  Plan: - Proceed with surgery OD - OS performed by Dr. Elmer Picker with [id number] placement. - target best distance correction - ok with lying flat on back - DM without DR - No fuchs, no prior eye surgery OD  2.  Encouraged good BP and BS control  3.  continue with current

## 2023-05-01 ENCOUNTER — Ambulatory Visit (HOSPITAL_COMMUNITY): Payer: BC Managed Care – PPO | Admitting: Anesthesiology

## 2023-05-01 ENCOUNTER — Ambulatory Visit (HOSPITAL_COMMUNITY)
Admission: RE | Admit: 2023-05-01 | Discharge: 2023-05-01 | Disposition: A | Discharge status: Home / Self Care | Payer: BC Managed Care – PPO | Attending: Optometry | Admitting: Optometry

## 2023-05-01 ENCOUNTER — Encounter (HOSPITAL_COMMUNITY): Payer: Self-pay | Admitting: Optometry

## 2023-05-01 ENCOUNTER — Encounter (HOSPITAL_COMMUNITY)
Admission: RE | Disposition: A | Discharge status: Home / Self Care | Payer: Self-pay | Source: Home / Self Care | Attending: Optometry

## 2023-05-01 DIAGNOSIS — Z6841 Body Mass Index (BMI) 40.0 and over, adult: Secondary | ICD-10-CM | POA: Insufficient documentation

## 2023-05-01 DIAGNOSIS — Z79899 Other long term (current) drug therapy: Secondary | ICD-10-CM | POA: Diagnosis not present

## 2023-05-01 DIAGNOSIS — Z7984 Long term (current) use of oral hypoglycemic drugs: Secondary | ICD-10-CM | POA: Diagnosis not present

## 2023-05-01 DIAGNOSIS — Z7985 Long-term (current) use of injectable non-insulin antidiabetic drugs: Secondary | ICD-10-CM | POA: Diagnosis not present

## 2023-05-01 DIAGNOSIS — H5201 Hypermetropia, right eye: Secondary | ICD-10-CM | POA: Diagnosis not present

## 2023-05-01 DIAGNOSIS — K219 Gastro-esophageal reflux disease without esophagitis: Secondary | ICD-10-CM | POA: Diagnosis not present

## 2023-05-01 DIAGNOSIS — H2511 Age-related nuclear cataract, right eye: Secondary | ICD-10-CM | POA: Diagnosis not present

## 2023-05-01 DIAGNOSIS — M1990 Unspecified osteoarthritis, unspecified site: Secondary | ICD-10-CM | POA: Diagnosis not present

## 2023-05-01 DIAGNOSIS — G709 Myoneural disorder, unspecified: Secondary | ICD-10-CM | POA: Insufficient documentation

## 2023-05-01 DIAGNOSIS — G4730 Sleep apnea, unspecified: Secondary | ICD-10-CM | POA: Diagnosis not present

## 2023-05-01 DIAGNOSIS — E1136 Type 2 diabetes mellitus with diabetic cataract: Secondary | ICD-10-CM | POA: Insufficient documentation

## 2023-05-01 HISTORY — PX: CATARACT EXTRACTION W/PHACO: SHX586

## 2023-05-01 LAB — GLUCOSE, CAPILLARY: Glucose-Capillary: 100 mg/dL — ABNORMAL HIGH (ref 70–99)

## 2023-05-01 SURGERY — PHACOEMULSIFICATION, CATARACT, WITH IOL INSERTION
Anesthesia: Monitor Anesthesia Care | Site: Eye | Laterality: Right

## 2023-05-01 MED ORDER — MIDAZOLAM HCL 5 MG/5ML IJ SOLN
INTRAMUSCULAR | Status: DC | PRN
  Administered 2023-05-01 (×2): 1 mg via INTRAVENOUS

## 2023-05-01 MED ORDER — SIGHTPATH DOSE#1 NA HYALUR & NA CHOND-NA HYALUR IO KIT
PACK | INTRAOCULAR | Status: DC | PRN
  Administered 2023-05-01: 1 via OPHTHALMIC

## 2023-05-01 MED ORDER — PHENYLEPHRINE-KETOROLAC 1-0.3 % IO SOLN
INTRAOCULAR | Status: DC | PRN
  Administered 2023-05-01: 500 mL via OPHTHALMIC

## 2023-05-01 MED ORDER — MIDAZOLAM HCL 2 MG/2ML IJ SOLN
INTRAMUSCULAR | Status: AC
  Filled 2023-05-01: qty 2

## 2023-05-01 MED ORDER — POVIDONE-IODINE 5 % OP SOLN
OPHTHALMIC | Status: DC | PRN
  Administered 2023-05-01: 1 via OPHTHALMIC

## 2023-05-01 MED ORDER — TROPICAMIDE 1 % OP SOLN
1.0000 [drp] | OPHTHALMIC | Status: AC
  Administered 2023-05-01 (×3): 1 [drp] via OPHTHALMIC

## 2023-05-01 MED ORDER — TETRACAINE HCL 0.5 % OP SOLN
1.0000 [drp] | OPHTHALMIC | Status: AC
  Administered 2023-05-01 (×3): 1 [drp] via OPHTHALMIC

## 2023-05-01 MED ORDER — STERILE WATER FOR IRRIGATION IR SOLN
Status: DC | PRN
  Administered 2023-05-01: 250 mL

## 2023-05-01 MED ORDER — PHENYLEPHRINE HCL 2.5 % OP SOLN
1.0000 [drp] | OPHTHALMIC | Status: AC
  Administered 2023-05-01 (×3): 1 [drp] via OPHTHALMIC

## 2023-05-01 MED ORDER — METOCLOPRAMIDE HCL 5 MG/ML IJ SOLN
10.0000 mg | Freq: Once | INTRAMUSCULAR | Status: AC
  Administered 2023-05-01: 10 mg via INTRAVENOUS

## 2023-05-01 MED ORDER — LIDOCAINE HCL 3.5 % OP GEL
1.0000 | Freq: Once | OPHTHALMIC | Status: AC
  Administered 2023-05-01: 1 via OPHTHALMIC

## 2023-05-01 MED ORDER — SODIUM CHLORIDE 0.9% FLUSH
INTRAVENOUS | Status: DC | PRN
  Administered 2023-05-01 (×2): 5 mL via INTRAVENOUS

## 2023-05-01 MED ORDER — NEOMYCIN-POLYMYXIN-DEXAMETH 3.5-10000-0.1 OP SUSP
OPHTHALMIC | Status: DC | PRN
  Administered 2023-05-01: 2 [drp] via OPHTHALMIC

## 2023-05-01 MED ORDER — LIDOCAINE HCL (PF) 1 % IJ SOLN
INTRAMUSCULAR | Status: DC | PRN
  Administered 2023-05-01: 1 mL

## 2023-05-01 MED ORDER — BSS IO SOLN
INTRAOCULAR | Status: DC | PRN
  Administered 2023-05-01: 15 mL via INTRAOCULAR

## 2023-05-01 MED ORDER — METOCLOPRAMIDE HCL 5 MG/ML IJ SOLN
INTRAMUSCULAR | Status: AC
  Filled 2023-05-01: qty 2

## 2023-05-01 SURGICAL SUPPLY — 14 items
CATARACT SUITE SIGHTPATH (MISCELLANEOUS) ×1 IMPLANT
CLOTH BEACON ORANGE TIMEOUT ST (SAFETY) ×1 IMPLANT
DRSG TEGADERM 4X4.75 (GAUZE/BANDAGES/DRESSINGS) ×1 IMPLANT
EYE SHIELD UNIVERSAL CLEAR (GAUZE/BANDAGES/DRESSINGS) IMPLANT
FEE CATARACT SUITE SIGHTPATH (MISCELLANEOUS) ×1 IMPLANT
GLOVE BIOGEL PI IND STRL 7.0 (GLOVE) ×2 IMPLANT
LENS IOL TECNIS EYHANCE 21.0 (Intraocular Lens) IMPLANT
NDL HYPO 18GX1.5 BLUNT FILL (NEEDLE) ×1 IMPLANT
NEEDLE HYPO 18GX1.5 BLUNT FILL (NEEDLE) ×1 IMPLANT
PAD ARMBOARD 7.5X6 YLW CONV (MISCELLANEOUS) ×1 IMPLANT
POSITIONER HEAD 8X9X4 ADT (SOFTGOODS) ×1 IMPLANT
SYR TB 1ML LL NO SAFETY (SYRINGE) ×1 IMPLANT
TAPE SURG TRANSPORE 1 IN (GAUZE/BANDAGES/DRESSINGS) IMPLANT
WATER STERILE IRR 250ML POUR (IV SOLUTION) ×1 IMPLANT

## 2023-05-01 NOTE — Anesthesia Preprocedure Evaluation (Signed)
Anesthesia Evaluation  Patient identified by MRN, date of birth, ID band Patient awake    Reviewed: Allergy & Precautions, H&P , NPO status , Patient's Chart, lab work & pertinent test results  History of Anesthesia Complications (+) PONV and history of anesthetic complications  Airway Mallampati: II  TM Distance: >3 FB Neck ROM: Full    Dental  (+) Dental Advisory Given, Missing   Pulmonary shortness of breath, sleep apnea    Pulmonary exam normal breath sounds clear to auscultation       Cardiovascular hypertension, Pt. on medications Normal cardiovascular exam Rhythm:Regular Rate:Normal     Neuro/Psych  Headaches  Neuromuscular disease  negative psych ROS   GI/Hepatic Neg liver ROS,GERD  Medicated and Controlled,,  Endo/Other  diabetes, Well Controlled, Type 2, Oral Hypoglycemic Agents  Morbid obesity  Renal/GU Renal disease  negative genitourinary   Musculoskeletal  (+) Arthritis , Osteoarthritis,    Abdominal   Peds negative pediatric ROS (+)  Hematology negative hematology ROS (+)   Anesthesia Other Findings   Reproductive/Obstetrics negative OB ROS                             Anesthesia Physical Anesthesia Plan  ASA: 3  Anesthesia Plan: MAC   Post-op Pain Management: Minimal or no pain anticipated   Induction: Intravenous  PONV Risk Score and Plan: 1 and Metaclopromide and Treatment may vary due to age or medical condition  Airway Management Planned:   Additional Equipment:   Intra-op Plan:   Post-operative Plan:   Informed Consent: I have reviewed the patients History and Physical, chart, labs and discussed the procedure including the risks, benefits and alternatives for the proposed anesthesia with the patient or authorized representative who has indicated his/her understanding and acceptance.     Dental advisory given  Plan Discussed with: CRNA and  Surgeon  Anesthesia Plan Comments:         Anesthesia Quick Evaluation

## 2023-05-01 NOTE — Interval H&P Note (Signed)
History and Physical Interval Note:  05/01/2023 9:01 AM  The H and P was reviewed and updated. The patient was examined.  No changes were found after exam.  The surgical eye was marked.  Rhonda Patton

## 2023-05-01 NOTE — Transfer of Care (Signed)
Immediate Anesthesia Transfer of Care Note  Patient: Rhonda Patton  Procedure(s) Performed: CATARACT EXTRACTION PHACO AND INTRAOCULAR LENS PLACEMENT (IOC) (Right: Eye)  Patient Location: Short Stay  Anesthesia Type:MAC  Level of Consciousness: awake  Airway & Oxygen Therapy: Patient Spontanous Breathing  Post-op Assessment: Report given to RN  Post vital signs: Reviewed and stable  Last Vitals:  Vitals Value Taken Time  BP    Temp    Pulse    Resp    SpO2      Last Pain:  Vitals:   05/01/23 0848  TempSrc: Oral  PainSc: 2          Complications: No notable events documented.

## 2023-05-01 NOTE — Anesthesia Postprocedure Evaluation (Signed)
Anesthesia Post Note  Patient: Rhonda Patton  Procedure(s) Performed: CATARACT EXTRACTION PHACO AND INTRAOCULAR LENS PLACEMENT (IOC) (Right: Eye)  Patient location during evaluation: Short Stay Anesthesia Type: MAC Level of consciousness: awake and alert Pain management: pain level controlled Vital Signs Assessment: post-procedure vital signs reviewed and stable Respiratory status: spontaneous breathing Cardiovascular status: blood pressure returned to baseline and stable Postop Assessment: no apparent nausea or vomiting Anesthetic complications: no   No notable events documented.   Last Vitals:  Vitals:   05/01/23 0848  BP: 128/73  Pulse: 65  Resp: (!) 22  Temp: 36.7 C  SpO2: 99%    Last Pain:  Vitals:   05/01/23 0848  TempSrc: Oral  PainSc: 2                  Harlie Buening

## 2023-05-01 NOTE — Discharge Instructions (Signed)
Please discharge patient when stable, will follow up today with Dr. Kemontae Dunklee at the [facility] office immediately following discharge.  Leave shield in place until visit.  All paperwork with discharge instructions will be given at the office.  [facility] Address:  [address]  [place], [place] [address]  Dr. Avigail Pilling's Phone: [phone]  

## 2023-05-01 NOTE — Op Note (Signed)
Date of procedure: 05/01/23  Pre-operative diagnosis: Visually significant age-related nuclear cataract, Right Eye (H25.11)  Post-operative diagnosis: Visually significant age-related nuclear cataract, Right Eye  Procedure: Removal of cataract via phacoemulsification and insertion of intra-ocular lens J&J DIBOO +21.0D into the capsular bag of the Right Eye  Attending surgeon: Pecolia Ades, MD  Anesthesia: MAC, Topical Akten  Complications: None  Estimated Blood Loss: <30mL (minimal)  Specimens: None  Implants:  Implant Name Type Inv. Item Serial No. Manufacturer Lot No. LRB No. Used Action  LENS IOL TECNIS EYHANCE 21.0 - [id number] Intraocular Lens LENS IOL TECNIS EYHANCE 21.0 [id number] [employer]  Right 1 Implanted    Indications:  Visually significant age-related cataract, Right Eye  Procedure:  The patient was seen and identified in the pre-operative area. The operative eye was identified and dilated.  The operative eye was marked.  Topical anesthesia was administered to the operative eye.     The patient was then to the operative suite and placed in the supine position.  A timeout was performed confirming the patient, procedure to be performed, and all other relevant information.   The patient's face was prepped and draped in the usual fashion for intra-ocular surgery.  A lid speculum was placed into the operative eye and the surgical microscope moved into place and focused.  A superotemporal paracentesis was created using a 20 gauge paracentesis blade.  BSS mixed with Omidria, followed by 1% lidocaine was injected into the anterior chamber.  Viscoelastic was injected into the anterior chamber.  A temporal clear-corneal main wound incision was created using a 2.82mm microkeratome.  A continuous curvilinear capsulorrhexis was initiated using an irrigating cystitome and completed using capsulorrhexis forceps.  Hydrodissection and hydrodeliniation were performed.  Viscoelastic was  injected into the anterior chamber.  A phacoemulsification handpiece and a chopper as a second instrument were used to remove the nucleus and epinucleus. The irrigation/aspiration handpiece was used to remove any remaining cortical material.   The capsular bag was reinflated with viscoelastic, checked, and found to be intact.  The intraocular lens was inserted into the capsular bag.  The irrigation/aspiration handpiece was used to remove any remaining viscoelastic.  The clear corneal wound and paracentesis wounds were then hydrated and checked with Weck-Cels to be watertight.  The lid-speculum and drape was removed, and the patient's face was cleaned with a wet and dry 4x4.  Maxitrol drops were instilled onto the eye. A clear shield was taped over the eye. The patient was taken to the post-operative care unit in good condition, having tolerated the procedure well.  Post-Op Instructions: The patient will follow up at [facility] for a same day post-operative evaluation and will receive all other orders and instructions.

## 2023-05-07 ENCOUNTER — Encounter (HOSPITAL_COMMUNITY): Payer: Self-pay | Admitting: Optometry

## 2023-06-09 DIAGNOSIS — H5201 Hypermetropia, right eye: Secondary | ICD-10-CM | POA: Diagnosis not present

## 2023-06-09 DIAGNOSIS — H524 Presbyopia: Secondary | ICD-10-CM | POA: Diagnosis not present

## 2023-06-17 DIAGNOSIS — I10 Essential (primary) hypertension: Secondary | ICD-10-CM | POA: Diagnosis not present

## 2023-06-17 DIAGNOSIS — E1165 Type 2 diabetes mellitus with hyperglycemia: Secondary | ICD-10-CM | POA: Diagnosis not present

## 2023-06-17 DIAGNOSIS — E559 Vitamin D deficiency, unspecified: Secondary | ICD-10-CM | POA: Diagnosis not present

## 2023-06-24 DIAGNOSIS — E1165 Type 2 diabetes mellitus with hyperglycemia: Secondary | ICD-10-CM | POA: Diagnosis not present

## 2023-06-24 DIAGNOSIS — N189 Chronic kidney disease, unspecified: Secondary | ICD-10-CM | POA: Diagnosis not present

## 2023-06-24 DIAGNOSIS — I10 Essential (primary) hypertension: Secondary | ICD-10-CM | POA: Diagnosis not present

## 2023-06-24 DIAGNOSIS — E785 Hyperlipidemia, unspecified: Secondary | ICD-10-CM | POA: Diagnosis not present

## 2023-06-24 DIAGNOSIS — I129 Hypertensive chronic kidney disease with stage 1 through stage 4 chronic kidney disease, or unspecified chronic kidney disease: Secondary | ICD-10-CM | POA: Diagnosis not present

## 2023-07-07 ENCOUNTER — Encounter (HOSPITAL_COMMUNITY): Admission: RE | Admit: 2023-07-07 | Payer: BC Managed Care – PPO | Source: Ambulatory Visit

## 2023-07-14 DIAGNOSIS — L818 Other specified disorders of pigmentation: Secondary | ICD-10-CM | POA: Diagnosis not present

## 2023-07-20 ENCOUNTER — Ambulatory Visit: Admit: 2023-07-20 | Payer: BC Managed Care – PPO | Admitting: Orthopedic Surgery

## 2023-07-20 SURGERY — ARTHROPLASTY, KNEE, TOTAL
Anesthesia: Choice | Site: Knee | Laterality: Right

## 2023-07-29 DIAGNOSIS — E669 Obesity, unspecified: Secondary | ICD-10-CM | POA: Diagnosis not present

## 2023-07-29 DIAGNOSIS — M25561 Pain in right knee: Secondary | ICD-10-CM | POA: Diagnosis not present

## 2023-07-29 DIAGNOSIS — M1711 Unilateral primary osteoarthritis, right knee: Secondary | ICD-10-CM | POA: Diagnosis not present

## 2023-07-29 DIAGNOSIS — G8929 Other chronic pain: Secondary | ICD-10-CM | POA: Diagnosis not present

## 2023-09-02 NOTE — H&P (Signed)
TOTAL KNEE ADMISSION H&P  Patient is being admitted for right total knee arthroplasty.  Subjective:  Chief Complaint: Right knee pain.  HPI: Rhonda Patton, 62 y.o. female has a history of pain and functional disability in the right knee due to arthritis and has failed non-surgical conservative treatments for greater than 12 weeks to include NSAID's and/or analgesics, corticosteriod injections, viscosupplementation injections, use of assistive devices, and activity modification. Onset of symptoms was gradual, starting several years ago with gradually worsening course since that time. The patient noted no past surgery on the right knee.  Patient currently rates pain in the right knee at 9 out of 10 with activity. Patient has night pain, worsening of pain with activity and weight bearing, and pain that interferes with activities of daily living. Patient has evidence of  bone-on-bone arthritis in the medial and patellofemoral compartments of the right knee  by imaging studies. There is no active infection.  Patient Active Problem List   Diagnosis Date Noted   Papilloma 10/29/2022   Failed total knee arthroplasty (HCC) 03/05/2022   Failed total left knee replacement (HCC) 03/05/2022   Allergic rhinitis 01/14/2022   Chronic knee pain after total replacement of right knee joint 10/22/2021   Headache 10/15/2021   Hyperlipidemia 07/12/2021   Chronic kidney disease 07/12/2021   Obesity 06/27/2021   Essential hypertension 06/27/2021   Bilateral swelling of feet and ankles 06/27/2021   Hyperglycemia due to type 2 diabetes mellitus (HCC) 04/18/2021   Acute maxillary sinusitis 03/27/2021   Achilles tendon contracture, right    Plantar fasciitis    S/P total knee replacement, left 09/24/17 09/24/2017   Rectal polyp    Gastritis due to nonsteroidal anti-inflammatory drug    Stricture and stenosis of esophagus    GERD (gastroesophageal reflux disease) 07/28/2017   Rectal bleeding 07/28/2017    Dysphagia 07/28/2017   Abdominal pain, epigastric 07/28/2017   Trigger finger, acquired    OA (osteoarthritis) of knee 10/07/2012   Left leg weakness 03/17/2012   Difficulty walking 03/17/2012   CARPAL TUNNEL SYNDROME 05/01/2010   MITRAL REGURGITATION 07/20/2009   Aortic valve disorder 07/20/2009   SHORTNESS OF BREATH 06/20/2009   CHEST PAIN-UNSPECIFIED 05/31/2009    Past Medical History:  Diagnosis Date   Arthritis    Carpal tunnel syndrome of right wrist    Chronic pain    Diabetes (HCC)    GERD (gastroesophageal reflux disease)    Hypercholesteremia    Hypertension    PONV (postoperative nausea and vomiting)    Sleep apnea    diagnosed but doesn't use CPAP;     Past Surgical History:  Procedure Laterality Date   ABDOMINAL HYSTERECTOMY     BALLOON DILATION N/A 10/09/2020   Procedure: BALLOON DILATION;  Surgeon: Lanelle Bal, DO;  [facility];  Service: Endoscopy;  Laterality: N/A;   BIOPSY  08/25/2017   Procedure: BIOPSY;  Surgeon: West Bali, MD;  [facility]: [facility];  Service: Endoscopy;;  gastric biopsy, gastric polyp   BIOPSY  10/09/2020   Procedure: BIOPSY;  Surgeon: Lanelle Bal, DO;  [facility];  Service: Endoscopy;;   BREAST BIOPSY Left 09/16/2022   [place] LT BREAST BX W LOC DEV 1ST LESION IMG BX SPEC US GUIDE 09/16/2022 [facility]   BREAST BIOPSY WITH RADIO FREQUENCY LOCALIZER Left 10/29/2022   Procedure: BREAST BIOPSY WITH RADIO FREQUENCY LOCALIZER;  Surgeon: Lewie Chamber, DO;  Location: [facility];  Service: General;  Laterality: Left;  CATARACT EXTRACTION W/PHACO Right 05/01/2023   Procedure: CATARACT EXTRACTION PHACO AND INTRAOCULAR LENS PLACEMENT (IOC);  Surgeon: Pecolia Ades, MD;  Location: [facility];  Service: Ophthalmology;  Laterality: Right;  CDE 2.88   CESAREAN SECTION     x 3   CHOLECYSTECTOMY     COLONOSCOPY WITH PROPOFOL N/A 08/25/2017   TI appeared normal. 5 mm polyp in rectum. Sessile. Redundant  colon. Internal hemorrhoids. Hyperplastic.    ESOPHAGOGASTRODUODENOSCOPY (EGD) WITH PROPOFOL N/A 08/25/2017   benign-apearing esophageal stenosis, dilation, small hiatal hernia, mild gastritis due to NSAIDs. Negative H.pylori.    ESOPHAGOGASTRODUODENOSCOPY (EGD) WITH PROPOFOL N/A 10/09/2020   Z-line irregular, benign-appearing esophageal stenosis, small hiatal hernia, gastritis, normal duodenum. +Barrett's esophagus, reactive gastropathy with erosions, negative celiac. Surveillance EGD in 2024.    FOOT SURGERY Right    x 2-bone spur   GASTROCNEMIUS RECESSION Right 12/18/2020   Procedure: RIGHT GASTROCNEMIUS RECSSION AND PLANTAR FASCIA RELEASE;  Surgeon: Nadara Mustard, MD;  Location: [facility];  Service: Orthopedics;  Laterality: Right;   HAND SURGERY Right    x 2-carpal tunnel   KNEE ARTHROSCOPY Left    KNEE ARTHROSCOPY WITH MEDIAL MENISECTOMY Left 05/07/2017   Procedure: KNEE ARTHROSCOPY WITH MEDIAL MENISECTOMY AND LATERAL MENISECTOMY, limited debriedment;  Surgeon: Vickki Hearing, MD;  Location: [facility];  Service: Orthopedics;  Laterality: Left;   PARTIAL HYSTERECTOMY  2001   total abdominal   POLYPECTOMY  08/25/2017   Procedure: POLYPECTOMY;  Surgeon: West Bali, MD;  [facility];  Service: Endoscopy;;  rectal polyp cs   SAVORY DILATION N/A 08/25/2017   Procedure: SAVORY DILATION;  Surgeon: West Bali, MD;  [facility];  Service: Endoscopy;  Laterality: N/A;   TOTAL KNEE ARTHROPLASTY Left 09/24/2017   Procedure: TOTAL KNEE ARTHROPLASTY;  Surgeon: Vickki Hearing, MD;  Location: [facility];  Service: Orthopedics;  Laterality: Left;   TOTAL KNEE REVISION Left 03/05/2022   Procedure: Left knee polyethylene revision;  Surgeon: Ollen Gross, MD;  Location: [facility];  Service: Orthopedics;  Laterality: Left;   TRIGGER FINGER RELEASE Right 11/27/2016   Procedure: RELEASE TRIGGER FINGER/A-1 PULLEY RIGHT LONG FINGER;  Surgeon: Vickki Hearing, MD;   Location: [facility];  Service: Orthopedics;  Laterality: Right;  pt knows to arrive at 8:45    Prior to Admission medications   Medication Sig Start Date End Date Taking? Authorizing Provider  acetaminophen (TYLENOL) 500 MG tablet Take 500-1,000 mg by mouth every 6 (six) hours as needed (pain).    [provider]  atorvastatin (LIPITOR) 40 MG tablet Take 40 mg by mouth at bedtime.    [provider]  Cholecalciferol (VITAMIN D-3 PO) Take 1 tablet by mouth every evening.    [provider]  famotidine (PEPCID) 40 MG tablet Take 40 mg by mouth at bedtime. 06/10/20   [provider]  FARXIGA 10 MG TABS tablet Take 10 mg by mouth daily. 06/24/23   [provider]  hydrochlorothiazide (HYDRODIURIL) 25 MG tablet Take 25 mg by mouth in the morning. 08/21/17   [provider]  levocetirizine (XYZAL) 5 MG tablet Take 5 mg by mouth daily as needed for allergies. 01/14/22   [provider]  MOUNJARO 5 MG/0.5ML Pen Inject 5 mg into the skin every Friday. 09/15/22   [provider]  pantoprazole (PROTONIX) 40 MG tablet TAKE 1 TABLET BY MOUTH EVERY DAY 12/15/22   Gelene Mink, NP    Allergies  Allergen  Reactions   Codeine Shortness Of Breath and Nausea And Vomiting   Aspirin Nausea Only    Social History   Socioeconomic History   Marital status: Divorced    Spouse name: Not on file   Number of children: Not on file   Years of education: 10   Highest education level: Not on file  Occupational History   Not on file  Tobacco Use   Smoking status: Never   Smokeless tobacco: Never  Vaping Use   Vaping status: Never Used  Substance and Sexual Activity   Alcohol use: No   Drug use: No   Sexual activity: Not Currently    Birth control/protection: Surgical  Other Topics Concern   Not on file  Social History Narrative   Not on file   Social Determinants of Health   Financial Resource Strain: Not on file  Food Insecurity: Not on  file  Transportation Needs: Not on file  Physical Activity: Not on file  Stress: Not on file  Social Connections: Not on file  Intimate Partner Violence: Not on file    Tobacco Use: Low Risk  (07/29/2023)   Received from [facility]   Patient History    Smoking Tobacco Use: Never    Smokeless Tobacco Use: Never    Passive Exposure: Not on file   Social History   Substance and Sexual Activity  Alcohol Use No    Family History  Problem Relation Age of Onset   Cancer Mother 20 - 34       breast   Colon cancer Mother 53   Heart disease Other    Arthritis Other    Cancer Other    Diabetes Other    Gastric cancer Neg Hx    Esophageal cancer Neg Hx     ROS  Objective:  Physical Exam: Well nourished and well developed.  General: Alert and oriented x3, cooperative and pleasant, no acute distress.  Head: normocephalic, atraumatic, neck supple.  Eyes: EOMI.  Abdomen: non-tender to palpation and soft, normoactive bowel sounds. Musculoskeletal: The patient has an antalgic gait pattern favoring the right side with the use of a cane.  Right Knee Exam: No effusion present. No swelling present. The range of motion is: 5 to 125 degrees. Moderate crepitus on range of motion of the knee. Positive medial joint line tenderness. No lateral joint line tenderness. The knee is stable.  Calves soft and nontender. Motor function intact in LE. Strength 5/5 LE bilaterally. Neuro: Distal pulses 2+. Sensation to light touch intact in LE.  Vital signs in last 24 hours: BP: ()/()  Arterial Line BP: ()/()   Imaging Review Plain radiographs demonstrate severe degenerative joint disease of the right knee. The overall alignment is neutral. The bone quality appears to be adequate for age and reported activity level.  Assessment/Plan:  End stage arthritis, right knee   The patient history, physical examination, clinical judgment of the provider and imaging studies are consistent with  end stage degenerative joint disease of the right knee and total knee arthroplasty is deemed medically necessary. The treatment options including medical management, injection therapy arthroscopy and arthroplasty were discussed at length. The risks and benefits of total knee arthroplasty were presented and reviewed. The risks due to aseptic loosening, infection, stiffness, patella tracking problems, thromboembolic complications and other imponderables were discussed. The patient acknowledged the explanation, agreed to proceed with the plan and consent was signed. Patient is being admitted for inpatient treatment for surgery, pain control,  PT, OT, prophylactic antibiotics, VTE prophylaxis, progressive ambulation and ADLs and discharge planning. The patient is planning to be discharged  home .  Patient's anticipated LOS is less than 2 midnights, meeting these requirements: - Younger than 12 - Lives within 1 hour of care - Has a competent adult at home to recover with post-op  - NO history of  - Chronic pain requiring opiods  - Coronary Artery Disease  - Heart failure  - Heart attack  - Stroke  - DVT/VTE  - Cardiac arrhythmia  - Respiratory Failure/COPD  - Renal failure  - Anemia  - Advanced Liver disease  Therapy Plans: [facility]) Disposition: Home with Husband Planned DVT Prophylaxis: Xarelto 10 mg DME Needed: None PCP: Catalina Pizza, MD (clearance received) TXA: IV Allergies: aspirin (gastric burning), codeine (N/V) Anesthesia Concerns: N/V BMI: 39.5 Last HgbA1c: unsure - will recheck with pre-op labs  Pharmacy: [facility]  Other: -hydromorphone with last TKA  - Patient was instructed on what medications to stop prior to surgery. - Follow-up visit in 2 weeks with Dr. Lequita Halt - Begin physical therapy following surgery - Pre-operative lab work as pre-surgical testing - Prescriptions will be provided in hospital at time of discharge  R. Arcola Jansky, PA-C Orthopedic  Surgery [facility] [place]

## 2023-09-03 NOTE — Patient Instructions (Addendum)
SURGICAL WAITING ROOM VISITATION  Patients having surgery or a procedure may have no more than 2 support people in the waiting area - these visitors may rotate.    Children under the age of 32 must have an adult with them who is not the patient.  Due to an increase in RSV and influenza rates and associated hospitalizations, children ages 3 and under may not visit patients in [facility] hospitals.  If the patient needs to stay at the hospital during part of their recovery, the visitor guidelines for inpatient rooms apply. Pre-op nurse will coordinate an appropriate time for 1 support person to accompany patient in pre-op.  This support person may not rotate.    Please refer to the  [facility] website for the visitor guidelines for Inpatients (after your surgery is over and you are in a regular room).       Your procedure is scheduled on: 09/14/23   Report to [facility] Main Entrance    Report to admitting at  9 AM   Call this number if you have problems the morning of surgery [phone]   Do not eat food :After Midnight.   After Midnight you may have the following liquids until 9:30 AM DAY OF SURGERY  Water Non-Citrus Juices (without pulp, NO RED-Apple, White grape, White cranberry) Black Coffee (NO MILK/CREAM OR CREAMERS, sugar ok)  Clear Tea (NO MILK/CREAM OR CREAMERS, sugar ok) regular and decaf                             Plain Jell-O (NO RED)                                           Fruit ices (not with fruit pulp, NO RED)                                     Popsicles (NO RED)                                                               Sports drinks like Gatorade (NO RED)                  The day of surgery:  Drink ONE (1) Pre G2 at 9:30 AM the morning of surgery. Drink in one sitting. Do not sip.  This drink was given to you during your hospital  pre-op appointment visit. Nothing else to drink after completing the  Pre-Surgery G2.       Oral Hygiene  is also important to reduce your risk of infection.                                    Remember - BRUSH YOUR TEETH THE MORNING OF SURGERY WITH YOUR REGULAR TOOTHPASTE   Stop all vitamins and herbal supplements 7 days before surgery.   Take these medicines the morning of surgery Tyenol if needed, Atorvastatin, Famotidine, Xyzal, Pantoprazole  DO NOT TAKE ANY  ORAL DIABETIC MEDICATIONS DAY OF YOUR SURGERY HOLD MOUJARO FOR 7 DAYS PRIOR TO SURGERY.              You may not have any metal on your body including hair pins, jewelry, and body piercing             Do not wear make-up, lotions, powders, perfumes/cologne, or deodorant  Do not wear nail polish including gel and S&S, artificial/acrylic nails, or any other type of covering on natural nails including finger and toenails. If you have artificial nails, gel coating, etc. that needs to be removed by a nail salon please have this removed prior to surgery or surgery may need to be canceled/ delayed if the surgeon/ anesthesia feels like they are unable to be safely monitored.   Do not shave  48 hours prior to surgery.    Do not bring valuables to the hospital.  IS NOT             RESPONSIBLE   FOR VALUABLES.   Contacts, glasses, dentures or bridgework may not be worn into surgery.   Bring small overnight bag day of surgery.   DO NOT BRING YOUR HOME MEDICATIONS TO THE HOSPITAL. PHARMACY WILL DISPENSE MEDICATIONS LISTED ON YOUR MEDICATION LIST TO YOU DURING YOUR ADMISSION IN THE HOSPITAL!    Patients discharged on the day of surgery will not be allowed to drive home.  Someone NEEDS to stay with you for the first 24 hours after anesthesia.   Special Instructions: Bring a copy of your healthcare power of attorney and living will documents the day of surgery if you haven't scanned them before.              Please read over the following fact sheets you were given: IF YOU HAVE QUESTIONS ABOUT YOUR PRE-OP INSTRUCTIONS PLEASE CALL  [phone] [facility]   If you received a COVID test during your pre-op visit  it is requested that you wear a mask when out in public, stay away from anyone that may not be feeling well and notify your surgeon if you develop symptoms. If you test positive for Covid or have been in contact with anyone that has tested positive in the last 10 days please notify you surgeon.      Pre-operative 5 CHG Bath Instructions   You can play a key role in reducing the risk of infection after surgery. Your skin needs to be as free of germs as possible. You can reduce the number of germs on your skin by washing with CHG (chlorhexidine gluconate) soap before surgery. CHG is an antiseptic soap that kills germs and continues to kill germs even after washing.   DO NOT use if you have an allergy to chlorhexidine/CHG or antibacterial soaps. If your skin becomes reddened or irritated, stop using the CHG and notify one of our RNs at [phone].   Please shower with the CHG soap starting 4 days before surgery using the following schedule:     Please keep in mind the following:  DO NOT shave, including legs and underarms, starting the day of your first shower.   You may shave your face at any point before/day of surgery.  Place clean sheets on your bed the day you start using CHG soap. Use a clean washcloth (not used since being washed) for each shower. DO NOT sleep with pets once you start using the CHG.   CHG Shower Instructions:  If you choose to wash your  hair and private area, wash first with your normal shampoo/soap.  After you use shampoo/soap, rinse your hair and body thoroughly to remove shampoo/soap residue.  Turn the water OFF and apply about 3 tablespoons (45 ml) of CHG soap to a CLEAN washcloth.  Apply CHG soap ONLY FROM YOUR NECK DOWN TO YOUR TOES (washing for 3-5 minutes)  DO NOT use CHG soap on face, private areas, open wounds, or sores.  Pay special attention to the area where your surgery is  being performed.  If you are having back surgery, having someone wash your back for you may be helpful. Wait 2 minutes after CHG soap is applied, then you may rinse off the CHG soap.  Pat dry with a clean towel  Put on clean clothes/pajamas   If you choose to wear lotion, please use ONLY the CHG-compatible lotions on the back of this paper.     Additional instructions for the day of surgery: DO NOT APPLY any lotions, deodorants, cologne, or perfumes.   Put on clean/comfortable clothes.  Brush your teeth.  Ask your nurse before applying any prescription medications to the skin.      CHG Compatible Lotions   Aveeno Moisturizing lotion  Cetaphil Moisturizing Cream  Cetaphil Moisturizing Lotion  Clairol Herbal Essence Moisturizing Lotion, Dry Skin  Clairol Herbal Essence Moisturizing Lotion, Extra Dry Skin  Clairol Herbal Essence Moisturizing Lotion, Normal Skin  Curel Age Defying Therapeutic Moisturizing Lotion with Alpha Hydroxy  Curel Extreme Care Body Lotion  Curel Soothing Hands Moisturizing Hand Lotion  Curel Therapeutic Moisturizing Cream, Fragrance-Free  Curel Therapeutic Moisturizing Lotion, Fragrance-Free  Curel Therapeutic Moisturizing Lotion, Original Formula  Eucerin Daily Replenishing Lotion  Eucerin Dry Skin Therapy Plus Alpha Hydroxy Crme  Eucerin Dry Skin Therapy Plus Alpha Hydroxy Lotion  Eucerin Original Crme  Eucerin Original Lotion  Eucerin Plus Crme Eucerin Plus Lotion  Eucerin TriLipid Replenishing Lotion  Keri Anti-Bacterial Hand Lotion  Keri Deep Conditioning Original Lotion Dry Skin Formula Softly Scented  Keri Deep Conditioning Original Lotion, Fragrance Free Sensitive Skin Formula  Keri Lotion Fast Absorbing Fragrance Free Sensitive Skin Formula  Keri Lotion Fast Absorbing Softly Scented Dry Skin Formula  Keri Original Lotion  Keri Skin Renewal Lotion Keri Silky Smooth Lotion  Keri Silky Smooth Sensitive Skin Lotion  Nivea Body Creamy  Conditioning Oil  Nivea Body Extra Enriched Lotion  Nivea Body Original Lotion  Nivea Body Sheer Moisturizing Lotion Nivea Crme  Nivea Skin Firming Lotion  NutraDerm 30 Skin Lotion  NutraDerm Skin Lotion  NutraDerm Therapeutic Skin Cream  NutraDerm Therapeutic Skin Lotion  ProShield Protective Hand Cream    Incentive Spirometer (Watch this video at home: [email or web address])  An incentive spirometer is a tool that can help keep your lungs clear and active. This tool measures how well you are filling your lungs with each breath. Taking long deep breaths may help reverse or decrease the chance of developing breathing (pulmonary) problems (especially infection) following: A long period of time when you are unable to move or be active. BEFORE THE PROCEDURE  If the spirometer includes an indicator to show your best effort, your nurse or respiratory therapist will set it to a desired goal. If possible, sit up straight or lean slightly forward. Try not to slouch. Hold the incentive spirometer in an upright position. INSTRUCTIONS FOR USE  Sit on the edge of your bed if possible, or sit up as far as you can in bed or on a chair. Hold the incentive spirometer  in an upright position. Breathe out normally. Place the mouthpiece in your mouth and seal your lips tightly around it. Breathe in slowly and as deeply as possible, raising the piston or the ball toward the top of the column. Hold your breath for 3-5 seconds or for as long as possible. Allow the piston or ball to fall to the bottom of the column. Remove the mouthpiece from your mouth and breathe out normally. Rest for a few seconds and repeat Steps 1 through 7 at least 10 times every 1-2 hours when you are awake. Take your time and take a few normal breaths between deep breaths. The spirometer may include an indicator to show your best effort. Use the indicator as a goal to work toward during each repetition. After  each set of 10 deep breaths, practice coughing to be sure your lungs are clear. If you have an incision (the cut made at the time of surgery), support your incision when coughing by placing a pillow or rolled up towels firmly against it. Once you are able to get out of bed, walk around indoors and cough well. You may stop using the incentive spirometer when instructed by your caregiver.  RISKS AND COMPLICATIONS Take your time so you do not get dizzy or light-headed. If you are in pain, you may need to take or ask for pain medication before doing incentive spirometry. It is harder to take a deep breath if you are having pain. AFTER USE Rest and breathe slowly and easily. It can be helpful to keep track of a log of your progress. Your caregiver can provide you with a simple table to help with this. If you are using the spirometer at home, follow these instructions: SEEK MEDICAL CARE IF:  You are having difficultly using the spirometer. You have trouble using the spirometer as often as instructed. Your pain medication is not giving enough relief while using the spirometer. You develop fever of 100.5 F (38.1 C) or higher. SEEK IMMEDIATE MEDICAL CARE IF:  You cough up bloody sputum that had not been present before. You develop fever of 102 F (38.9 C) or greater. You develop worsening pain at or near the incision site. MAKE SURE YOU:  Understand these instructions. Will watch your condition. Will get help right away if you are not doing well or get worse. Document Released: 02/16/2007 Document Revised: 12/29/2011 Document Reviewed: 04/19/2007 [facility] Patient Information 2014 [place], [place].

## 2023-09-03 NOTE — Progress Notes (Addendum)
COVID Vaccine received:  []  No [x]  Yes Date of any COVID positive Test in last 90 days: no PCP - John HallMD Cardiologist - no  Chest x-ray -  EKG -  10/27/22 Epic Stress Test -  ECHO -  Cardiac Cath -   Bowel Prep - [x]  No  []   Yes ______  Pacemaker / ICD device [x]  No []  Yes   Spinal Cord Stimulator:[x]  No []  Yes       History of Sleep Apnea? [x]  No []  Yes   CPAP used?- [x]  No []  Yes    Does the patient monitor blood sugar?          []  No [x]  Yes  []  N/A  Patient has: []  NO Hx DM   []  Pre-DM                 []  DM1  [x]   DM2 Does patient have a [employer] or Dexacom? []  No []  Yes   Fasting Blood Sugar Ranges- 99-145 Checks Blood Sugar ___2__ times a day  GLP1 agonist / usual dose - Moujaro last dose 08/22/23 GLP1 instructions:  SGLT-2 inhibitors / usual dose -  SGLT-2 instructions:   Blood Thinner / Instructions:no Aspirin Instructions:no  Comments:   Activity level: Patient is able  to climb a flight of stairs without difficulty; [x]  No CP  [x]  No SOB, ___   Patient can  perform ADLs without assistance.   Anesthesia review: HTN, CKD,Prediabetic, Mitral valve regurg, Aortic valve disorder.  Patient denies shortness of breath, fever, cough and chest pain at PAT appointment.  Patient verbalized understanding and agreement to the Pre-Surgical Instructions that were given to them at this PAT appointment. Patient was also educated of the need to review these PAT instructions again prior to his/her surgery.I reviewed the appropriate phone numbers to call if they have any and questions or concerns.

## 2023-09-04 ENCOUNTER — Encounter (HOSPITAL_COMMUNITY)
Admission: RE | Admit: 2023-09-04 | Discharge: 2023-09-04 | Disposition: A | Discharge status: Home / Self Care | Payer: BC Managed Care – PPO | Source: Ambulatory Visit | Attending: Orthopedic Surgery

## 2023-09-04 ENCOUNTER — Other Ambulatory Visit: Payer: Self-pay

## 2023-09-04 VITALS — BP 124/75 | HR 60 | Temp 98.2°F | Resp 16 | Ht 63.0 in | Wt 226.0 lb

## 2023-09-04 DIAGNOSIS — E119 Type 2 diabetes mellitus without complications: Secondary | ICD-10-CM | POA: Insufficient documentation

## 2023-09-04 DIAGNOSIS — Z01812 Encounter for preprocedural laboratory examination: Secondary | ICD-10-CM | POA: Diagnosis not present

## 2023-09-04 DIAGNOSIS — Z01818 Encounter for other preprocedural examination: Secondary | ICD-10-CM

## 2023-09-04 DIAGNOSIS — I10 Essential (primary) hypertension: Secondary | ICD-10-CM | POA: Diagnosis not present

## 2023-09-04 LAB — BASIC METABOLIC PANEL
Anion gap: 10 (ref 5–15)
BUN: 9 mg/dL (ref 8–23)
CO2: 24 mmol/L (ref 22–32)
Calcium: 9.6 mg/dL (ref 8.9–10.3)
Chloride: 104 mmol/L (ref 98–111)
Creatinine, Ser: 0.99 mg/dL (ref 0.44–1.00)
GFR, Estimated: >60 mL/min (ref >60)
Glucose, Bld: 86 mg/dL (ref 70–99)
Potassium: 3.4 mmol/L — ABNORMAL LOW (ref 3.5–5.1)
Sodium: 138 mmol/L (ref 135–145)

## 2023-09-04 LAB — CBC
HCT: 40.8 % (ref 36.0–46.0)
Hemoglobin: 13.9 g/dL (ref 12.0–15.0)
MCH: 29.1 pg (ref 26.0–34.0)
MCHC: 34.1 g/dL (ref 30.0–36.0)
MCV: 85.5 fL (ref 80.0–100.0)
Platelets: 213 10*3/uL (ref 150–400)
RBC: 4.77 MIL/uL (ref 3.87–5.11)
RDW: 13.3 % (ref 11.5–15.5)
WBC: 3.9 10*3/uL — ABNORMAL LOW (ref 4.0–10.5)
nRBC: 0 % (ref 0.0–0.2)

## 2023-09-04 LAB — GLUCOSE, CAPILLARY: Glucose-Capillary: 76 mg/dL (ref 70–99)

## 2023-09-04 LAB — HEMOGLOBIN A1C
Hgb A1c MFr Bld: 5.9 % — ABNORMAL HIGH (ref 4.8–5.6)
Mean Plasma Glucose: 122.63 mg/dL

## 2023-09-05 LAB — SURGICAL PCR SCREEN
MRSA, PCR: NEGATIVE
Staphylococcus aureus: NEGATIVE

## 2023-09-14 ENCOUNTER — Ambulatory Visit (HOSPITAL_COMMUNITY): Payer: BC Managed Care – PPO | Admitting: Physician Assistant

## 2023-09-14 ENCOUNTER — Other Ambulatory Visit: Payer: Self-pay

## 2023-09-14 ENCOUNTER — Ambulatory Visit (HOSPITAL_COMMUNITY): Payer: BC Managed Care – PPO | Admitting: Anesthesiology

## 2023-09-14 ENCOUNTER — Encounter (HOSPITAL_COMMUNITY)
Admission: RE | Disposition: A | Discharge status: Home / Self Care | Payer: Self-pay | Source: Home / Self Care | Attending: Orthopedic Surgery

## 2023-09-14 ENCOUNTER — Encounter (HOSPITAL_COMMUNITY): Payer: Self-pay | Admitting: Orthopedic Surgery

## 2023-09-14 ENCOUNTER — Inpatient Hospital Stay (HOSPITAL_COMMUNITY)
Admission: RE | Admit: 2023-09-14 | Discharge: 2023-09-17 | DRG: 470 | Disposition: A | Discharge status: Home / Self Care | Payer: BC Managed Care – PPO | Attending: Orthopedic Surgery | Admitting: Orthopedic Surgery

## 2023-09-14 DIAGNOSIS — Z833 Family history of diabetes mellitus: Secondary | ICD-10-CM | POA: Diagnosis not present

## 2023-09-14 DIAGNOSIS — Z800 Family history of malignant neoplasm of digestive organs: Secondary | ICD-10-CM | POA: Diagnosis not present

## 2023-09-14 DIAGNOSIS — E119 Type 2 diabetes mellitus without complications: Secondary | ICD-10-CM

## 2023-09-14 DIAGNOSIS — I130 Hypertensive heart and chronic kidney disease with heart failure and stage 1 through stage 4 chronic kidney disease, or unspecified chronic kidney disease: Secondary | ICD-10-CM | POA: Diagnosis present

## 2023-09-14 DIAGNOSIS — Z885 Allergy status to narcotic agent status: Secondary | ICD-10-CM | POA: Diagnosis not present

## 2023-09-14 DIAGNOSIS — Z7985 Long-term (current) use of injectable non-insulin antidiabetic drugs: Secondary | ICD-10-CM

## 2023-09-14 DIAGNOSIS — E7800 Pure hypercholesterolemia, unspecified: Secondary | ICD-10-CM | POA: Diagnosis not present

## 2023-09-14 DIAGNOSIS — Z886 Allergy status to analgesic agent status: Secondary | ICD-10-CM | POA: Diagnosis not present

## 2023-09-14 DIAGNOSIS — Z7984 Long term (current) use of oral hypoglycemic drugs: Secondary | ICD-10-CM

## 2023-09-14 DIAGNOSIS — Z96652 Presence of left artificial knee joint: Secondary | ICD-10-CM | POA: Diagnosis not present

## 2023-09-14 DIAGNOSIS — Z6841 Body Mass Index (BMI) 40.0 and over, adult: Secondary | ICD-10-CM | POA: Diagnosis not present

## 2023-09-14 DIAGNOSIS — E1122 Type 2 diabetes mellitus with diabetic chronic kidney disease: Secondary | ICD-10-CM | POA: Diagnosis present

## 2023-09-14 DIAGNOSIS — I080 Rheumatic disorders of both mitral and aortic valves: Secondary | ICD-10-CM | POA: Diagnosis not present

## 2023-09-14 DIAGNOSIS — E669 Obesity, unspecified: Secondary | ICD-10-CM | POA: Diagnosis present

## 2023-09-14 DIAGNOSIS — M1711 Unilateral primary osteoarthritis, right knee: Secondary | ICD-10-CM | POA: Diagnosis not present

## 2023-09-14 DIAGNOSIS — Z79899 Other long term (current) drug therapy: Secondary | ICD-10-CM | POA: Diagnosis not present

## 2023-09-14 DIAGNOSIS — Z90711 Acquired absence of uterus with remaining cervical stump: Secondary | ICD-10-CM | POA: Diagnosis not present

## 2023-09-14 DIAGNOSIS — K219 Gastro-esophageal reflux disease without esophagitis: Secondary | ICD-10-CM | POA: Diagnosis present

## 2023-09-14 DIAGNOSIS — N189 Chronic kidney disease, unspecified: Secondary | ICD-10-CM | POA: Diagnosis present

## 2023-09-14 DIAGNOSIS — G8929 Other chronic pain: Secondary | ICD-10-CM

## 2023-09-14 DIAGNOSIS — M179 Osteoarthritis of knee, unspecified: Secondary | ICD-10-CM | POA: Diagnosis present

## 2023-09-14 DIAGNOSIS — G8918 Other acute postprocedural pain: Secondary | ICD-10-CM | POA: Diagnosis not present

## 2023-09-14 DIAGNOSIS — I10 Essential (primary) hypertension: Secondary | ICD-10-CM | POA: Diagnosis not present

## 2023-09-14 DIAGNOSIS — Z01818 Encounter for other preprocedural examination: Principal | ICD-10-CM

## 2023-09-14 HISTORY — PX: TOTAL KNEE ARTHROPLASTY: SHX125

## 2023-09-14 LAB — GLUCOSE, CAPILLARY
Glucose-Capillary: 91 mg/dL (ref 70–99)
Glucose-Capillary: 108 mg/dL — ABNORMAL HIGH (ref 70–99)
Glucose-Capillary: 124 mg/dL — ABNORMAL HIGH (ref 70–99)
Glucose-Capillary: 181 mg/dL — ABNORMAL HIGH (ref 70–99)

## 2023-09-14 SURGERY — ARTHROPLASTY, KNEE, TOTAL
Anesthesia: Spinal | Site: Knee | Laterality: Right

## 2023-09-14 MED ORDER — ROPIVACAINE HCL 7.5 MG/ML IJ SOLN
INTRAMUSCULAR | Status: DC | PRN
  Administered 2023-09-14: 20 mL via PERINEURAL

## 2023-09-14 MED ORDER — METOCLOPRAMIDE HCL 5 MG PO TABS
5.0000 mg | ORAL_TABLET | Freq: Three times a day (TID) | ORAL | Status: DC | PRN
Start: 2023-09-14 — End: 2023-09-17
  Administered 2023-09-15: 10 mg via ORAL
  Filled 2023-09-14: qty 2

## 2023-09-14 MED ORDER — BISACODYL 10 MG RE SUPP: 10.0000 mg | Freq: Every day | RECTAL | Status: DC | PRN

## 2023-09-14 MED ORDER — SODIUM CHLORIDE (PF) 0.9 % IJ SOLN
INTRAMUSCULAR | Status: AC
  Filled 2023-09-14: qty 10

## 2023-09-14 MED ORDER — ACETAMINOPHEN 500 MG PO TABS
1000.0000 mg | ORAL_TABLET | Freq: Four times a day (QID) | ORAL | Status: AC
Start: 2023-09-14 — End: 2023-09-15
  Administered 2023-09-14 – 2023-09-15 (×4): 1000 mg via ORAL
  Filled 2023-09-14 (×3): qty 2

## 2023-09-14 MED ORDER — GABAPENTIN 300 MG PO CAPS
300.0000 mg | ORAL_CAPSULE | Freq: Three times a day (TID) | ORAL | Status: DC
  Administered 2023-09-14 – 2023-09-15 (×5): 300 mg via ORAL
  Filled 2023-09-14 (×5): qty 1

## 2023-09-14 MED ORDER — LACTATED RINGERS IV SOLN
INTRAVENOUS | Status: DC
Start: 2023-09-14 — End: 2023-09-14

## 2023-09-14 MED ORDER — PANTOPRAZOLE SODIUM 40 MG PO TBEC
40.0000 mg | DELAYED_RELEASE_TABLET | Freq: Every day | ORAL | Status: DC
  Administered 2023-09-15 – 2023-09-17 (×3): 40 mg via ORAL
  Filled 2023-09-14 (×3): qty 1

## 2023-09-14 MED ORDER — OXYCODONE HCL 5 MG/5ML PO SOLN: 5.0000 mg | Freq: Once | ORAL | Status: DC | PRN

## 2023-09-14 MED ORDER — HYDROMORPHONE HCL 1 MG/ML IJ SOLN: 0.2500 mg | INTRAMUSCULAR | Status: DC | PRN

## 2023-09-14 MED ORDER — INSULIN ASPART 100 UNIT/ML IJ SOLN: 0.0000 [IU] | Freq: Every day | INTRAMUSCULAR | Status: DC

## 2023-09-14 MED ORDER — FAMOTIDINE 20 MG PO TABS
40.0000 mg | ORAL_TABLET | Freq: Every day | ORAL | Status: DC
  Administered 2023-09-14 – 2023-09-16 (×3): 40 mg via ORAL
  Filled 2023-09-14 (×3): qty 2

## 2023-09-14 MED ORDER — CEFAZOLIN SODIUM-DEXTROSE 2-4 GM/100ML-% IV SOLN
2.0000 g | INTRAVENOUS | Status: AC
  Administered 2023-09-14: 2 g via INTRAVENOUS
  Filled 2023-09-14: qty 100

## 2023-09-14 MED ORDER — MIDAZOLAM HCL 2 MG/2ML IJ SOLN: 1.0000 mg | INTRAMUSCULAR | Status: DC

## 2023-09-14 MED ORDER — BUPIVACAINE LIPOSOME 1.3 % IJ SUSP: 20.0000 mL | Freq: Once | INTRAMUSCULAR | Status: DC

## 2023-09-14 MED ORDER — PROPOFOL 500 MG/50ML IV EMUL
INTRAVENOUS | Status: DC | PRN
  Administered 2023-09-14: 80 ug/kg/min via INTRAVENOUS

## 2023-09-14 MED ORDER — METHOCARBAMOL 1000 MG/10ML IJ SOLN: 500.0000 mg | Freq: Four times a day (QID) | INTRAMUSCULAR | Status: DC | PRN

## 2023-09-14 MED ORDER — ACETAMINOPHEN 325 MG PO TABS
325.0000 mg | ORAL_TABLET | Freq: Four times a day (QID) | ORAL | Status: DC | PRN
  Administered 2023-09-16 – 2023-09-17 (×2): 650 mg via ORAL
  Filled 2023-09-14 (×2): qty 2

## 2023-09-14 MED ORDER — SODIUM CHLORIDE 0.9 % IR SOLN
Status: DC | PRN
  Administered 2023-09-14: 1000 mL

## 2023-09-14 MED ORDER — STERILE WATER FOR IRRIGATION IR SOLN
Status: DC | PRN
  Administered 2023-09-14: 1000 mL

## 2023-09-14 MED ORDER — DEXAMETHASONE SODIUM PHOSPHATE 10 MG/ML IJ SOLN
8.0000 mg | Freq: Once | INTRAMUSCULAR | Status: AC
  Administered 2023-09-14: 8 mg via INTRAVENOUS

## 2023-09-14 MED ORDER — SODIUM CHLORIDE (PF) 0.9 % IJ SOLN
INTRAMUSCULAR | Status: AC
  Filled 2023-09-14: qty 50

## 2023-09-14 MED ORDER — SODIUM CHLORIDE (PF) 0.9 % IJ SOLN
INTRAMUSCULAR | Status: DC | PRN
  Administered 2023-09-14: 60 mL

## 2023-09-14 MED ORDER — MENTHOL 3 MG MT LOZG: 1.0000 | LOZENGE | OROMUCOSAL | Status: DC | PRN

## 2023-09-14 MED ORDER — CLONIDINE HCL (ANALGESIA) 100 MCG/ML EP SOLN
EPIDURAL | Status: DC | PRN
  Administered 2023-09-14: 100 ug

## 2023-09-14 MED ORDER — ONDANSETRON HCL 4 MG/2ML IJ SOLN
INTRAMUSCULAR | Status: DC | PRN
  Administered 2023-09-14: 4 mg via INTRAVENOUS

## 2023-09-14 MED ORDER — ACETAMINOPHEN 10 MG/ML IV SOLN
1000.0000 mg | Freq: Four times a day (QID) | INTRAVENOUS | Status: DC
  Filled 2023-09-14: qty 100

## 2023-09-14 MED ORDER — HYDROMORPHONE HCL 2 MG PO TABS
2.0000 mg | ORAL_TABLET | ORAL | Status: DC | PRN
  Administered 2023-09-14 – 2023-09-15 (×3): 3 mg via ORAL
  Filled 2023-09-14 (×4): qty 2

## 2023-09-14 MED ORDER — DIPHENHYDRAMINE HCL 12.5 MG/5ML PO ELIX: 12.5000 mg | ORAL_SOLUTION | ORAL | Status: DC | PRN

## 2023-09-14 MED ORDER — 0.9 % SODIUM CHLORIDE (POUR BTL) OPTIME
TOPICAL | Status: DC | PRN
  Administered 2023-09-14: 1000 mL

## 2023-09-14 MED ORDER — LACTATED RINGERS IV SOLN: INTRAVENOUS | Status: DC

## 2023-09-14 MED ORDER — BUPIVACAINE IN DEXTROSE 0.75-8.25 % IT SOLN
INTRATHECAL | Status: DC | PRN
  Administered 2023-09-14: 1.4 mL via INTRATHECAL

## 2023-09-14 MED ORDER — METHOCARBAMOL 500 MG PO TABS
500.0000 mg | ORAL_TABLET | Freq: Four times a day (QID) | ORAL | Status: DC | PRN
  Administered 2023-09-14 – 2023-09-17 (×5): 500 mg via ORAL
  Filled 2023-09-14 (×6): qty 1

## 2023-09-14 MED ORDER — TRANEXAMIC ACID-NACL 1000-0.7 MG/100ML-% IV SOLN
1000.0000 mg | INTRAVENOUS | Status: AC
  Administered 2023-09-14: 1000 mg via INTRAVENOUS
  Filled 2023-09-14: qty 100

## 2023-09-14 MED ORDER — ONDANSETRON HCL 4 MG/2ML IJ SOLN
4.0000 mg | Freq: Four times a day (QID) | INTRAMUSCULAR | Status: DC | PRN
  Administered 2023-09-15: 4 mg via INTRAVENOUS
  Filled 2023-09-14: qty 2

## 2023-09-14 MED ORDER — PROPOFOL 10 MG/ML IV BOLUS
INTRAVENOUS | Status: DC | PRN
  Administered 2023-09-14 (×2): 10 mg via INTRAVENOUS

## 2023-09-14 MED ORDER — DEXAMETHASONE SODIUM PHOSPHATE 10 MG/ML IJ SOLN
INTRAMUSCULAR | Status: AC
  Filled 2023-09-14: qty 1

## 2023-09-14 MED ORDER — FLEET ENEMA RE ENEM: 1.0000 | ENEMA | Freq: Once | RECTAL | Status: DC | PRN

## 2023-09-14 MED ORDER — POLYETHYLENE GLYCOL 3350 17 G PO PACK: 17.0000 g | PACK | Freq: Every day | ORAL | Status: DC | PRN

## 2023-09-14 MED ORDER — PHENOL 1.4 % MT LIQD: 1.0000 | OROMUCOSAL | Status: DC | PRN

## 2023-09-14 MED ORDER — HYDROCHLOROTHIAZIDE 25 MG PO TABS
25.0000 mg | ORAL_TABLET | Freq: Every day | ORAL | Status: DC
  Administered 2023-09-15 – 2023-09-17 (×2): 25 mg via ORAL
  Filled 2023-09-14 (×2): qty 1

## 2023-09-14 MED ORDER — BUPIVACAINE LIPOSOME 1.3 % IJ SUSP
INTRAMUSCULAR | Status: DC | PRN
  Administered 2023-09-14: 20 mL

## 2023-09-14 MED ORDER — ONDANSETRON HCL 4 MG/2ML IJ SOLN
INTRAMUSCULAR | Status: AC
  Filled 2023-09-14: qty 2

## 2023-09-14 MED ORDER — FENTANYL CITRATE PF 50 MCG/ML IJ SOSY
50.0000 ug | PREFILLED_SYRINGE | INTRAMUSCULAR | Status: AC
  Administered 2023-09-14: 50 ug via INTRAVENOUS
  Filled 2023-09-14: qty 1

## 2023-09-14 MED ORDER — SODIUM CHLORIDE 0.9 % IV SOLN: INTRAVENOUS | Status: DC

## 2023-09-14 MED ORDER — PROPOFOL 500 MG/50ML IV EMUL
INTRAVENOUS | Status: AC
  Filled 2023-09-14: qty 50

## 2023-09-14 MED ORDER — CHLORHEXIDINE GLUCONATE 0.12 % MT SOLN
15.0000 mL | Freq: Once | OROMUCOSAL | Status: AC
  Administered 2023-09-14: 15 mL via OROMUCOSAL

## 2023-09-14 MED ORDER — ORAL CARE MOUTH RINSE: 15.0000 mL | Freq: Once | OROMUCOSAL | Status: AC

## 2023-09-14 MED ORDER — HYDROMORPHONE HCL 2 MG PO TABS
1.0000 mg | ORAL_TABLET | ORAL | Status: DC | PRN
  Administered 2023-09-14: 2 mg via ORAL
  Filled 2023-09-14: qty 1

## 2023-09-14 MED ORDER — DOCUSATE SODIUM 100 MG PO CAPS
100.0000 mg | ORAL_CAPSULE | Freq: Two times a day (BID) | ORAL | Status: DC
  Administered 2023-09-14 – 2023-09-17 (×7): 100 mg via ORAL
  Filled 2023-09-14 (×8): qty 1

## 2023-09-14 MED ORDER — BUPIVACAINE LIPOSOME 1.3 % IJ SUSP
INTRAMUSCULAR | Status: AC
  Filled 2023-09-14: qty 20

## 2023-09-14 MED ORDER — ATORVASTATIN CALCIUM 40 MG PO TABS
40.0000 mg | ORAL_TABLET | Freq: Every day | ORAL | Status: DC
  Administered 2023-09-15 – 2023-09-16 (×2): 40 mg via ORAL
  Filled 2023-09-14 (×2): qty 1

## 2023-09-14 MED ORDER — ONDANSETRON HCL 4 MG/2ML IJ SOLN: 4.0000 mg | Freq: Once | INTRAMUSCULAR | Status: DC | PRN

## 2023-09-14 MED ORDER — OXYCODONE HCL 5 MG PO TABS: 5.0000 mg | ORAL_TABLET | Freq: Once | ORAL | Status: DC | PRN

## 2023-09-14 MED ORDER — ONDANSETRON HCL 4 MG PO TABS
4.0000 mg | ORAL_TABLET | Freq: Four times a day (QID) | ORAL | Status: DC | PRN
  Administered 2023-09-16: 4 mg via ORAL
  Filled 2023-09-14: qty 1

## 2023-09-14 MED ORDER — RIVAROXABAN 10 MG PO TABS
10.0000 mg | ORAL_TABLET | Freq: Every day | ORAL | Status: DC
  Administered 2023-09-15 – 2023-09-17 (×3): 10 mg via ORAL
  Filled 2023-09-14 (×3): qty 1

## 2023-09-14 MED ORDER — HYDROMORPHONE HCL 1 MG/ML IJ SOLN
0.5000 mg | INTRAMUSCULAR | Status: DC | PRN
  Administered 2023-09-14: 0.5 mg via INTRAVENOUS
  Administered 2023-09-15: 1 mg via INTRAVENOUS
  Filled 2023-09-14 (×2): qty 1

## 2023-09-14 MED ORDER — METOCLOPRAMIDE HCL 5 MG/ML IJ SOLN: 5.0000 mg | Freq: Three times a day (TID) | INTRAMUSCULAR | Status: DC | PRN

## 2023-09-14 MED ORDER — INSULIN ASPART 100 UNIT/ML IJ SOLN
0.0000 [IU] | Freq: Three times a day (TID) | INTRAMUSCULAR | Status: DC
  Administered 2023-09-14 – 2023-09-15 (×2): 2 [IU] via SUBCUTANEOUS

## 2023-09-14 MED ORDER — POVIDONE-IODINE 10 % EX SWAB: 2.0000 | Freq: Once | CUTANEOUS | Status: DC

## 2023-09-14 MED ORDER — CEFAZOLIN SODIUM-DEXTROSE 2-4 GM/100ML-% IV SOLN
2.0000 g | Freq: Four times a day (QID) | INTRAVENOUS | Status: AC
  Administered 2023-09-14 (×2): 2 g via INTRAVENOUS
  Filled 2023-09-14 (×2): qty 100

## 2023-09-14 SURGICAL SUPPLY — 44 items
ATTUNE MED DOME PAT 38 KNEE (Knees) IMPLANT
ATTUNE PS FEM RT SZ 5 CEM KNEE (Femur) IMPLANT
ATTUNE PSRP INSE SZ5 7 KNEE (Insert) IMPLANT
BAG COUNTER SPONGE SURGICOUNT (BAG) IMPLANT
BAG ZIPLOCK 12X15 (MISCELLANEOUS) ×1 IMPLANT
BASE TIBIAL ROT PLAT SZ 5 KNEE (Knees) IMPLANT
BLADE SAG 18X100X1.27 (BLADE) ×1 IMPLANT
BLADE SAW SGTL 11.0X1.19X90.0M (BLADE) ×1 IMPLANT
BNDG ELASTIC 6INX 5YD STR LF (GAUZE/BANDAGES/DRESSINGS) ×1 IMPLANT
BOWL SMART MIX CTS (DISPOSABLE) ×1 IMPLANT
CEMENT HV SMART SET (Cement) ×2 IMPLANT
COVER SURGICAL LIGHT HANDLE (MISCELLANEOUS) ×1 IMPLANT
CUFF TRNQT CYL 34X4.125X (TOURNIQUET CUFF) ×1 IMPLANT
DERMABOND ADVANCED .7 DNX12 (GAUZE/BANDAGES/DRESSINGS) ×1 IMPLANT
DRAPE U-SHAPE 47X51 STRL (DRAPES) ×1 IMPLANT
DRSG AQUACEL AG ADV 3.5X10 (GAUZE/BANDAGES/DRESSINGS) ×1 IMPLANT
DURAPREP 26ML APPLICATOR (WOUND CARE) ×1 IMPLANT
ELECT REM PT RETURN 15FT ADLT (MISCELLANEOUS) ×1 IMPLANT
GLOVE BIO SURGEON STRL SZ 6.5 (GLOVE) IMPLANT
GLOVE BIO SURGEON STRL SZ8 (GLOVE) ×1 IMPLANT
GLOVE BIOGEL PI IND STRL 6.5 (GLOVE) IMPLANT
GLOVE BIOGEL PI IND STRL 7.0 (GLOVE) IMPLANT
GLOVE BIOGEL PI IND STRL 8 (GLOVE) ×1 IMPLANT
GOWN STRL REUS W/ TWL LRG LVL3 (GOWN DISPOSABLE) ×1 IMPLANT
HOLDER FOLEY CATH W/STRAP (MISCELLANEOUS) IMPLANT
IMMOBILIZER KNEE 20 (SOFTGOODS) ×1
IMMOBILIZER KNEE 20 THIGH 36 (SOFTGOODS) ×1 IMPLANT
KIT TURNOVER KIT A (KITS) IMPLANT
MANIFOLD NEPTUNE II (INSTRUMENTS) ×1 IMPLANT
NS IRRIG 1000ML POUR BTL (IV SOLUTION) ×1 IMPLANT
PACK TOTAL KNEE CUSTOM (KITS) ×1 IMPLANT
PADDING CAST COTTON 6X4 STRL (CAST SUPPLIES) ×2 IMPLANT
PIN STEINMAN FIXATION KNEE (PIN) IMPLANT
PROTECTOR NERVE ULNAR (MISCELLANEOUS) ×1 IMPLANT
SET HNDPC FAN SPRY TIP SCT (DISPOSABLE) ×1 IMPLANT
SUT MNCRL AB 4-0 PS2 18 (SUTURE) ×1 IMPLANT
SUT STRATAFIX 0 PDS 27 VIOLET (SUTURE) ×1
SUT VIC AB 2-0 CT1 TAPERPNT 27 (SUTURE) ×3 IMPLANT
SUTURE STRATFX 0 PDS 27 VIOLET (SUTURE) ×1 IMPLANT
TIBIAL BASE ROT PLAT SZ 5 KNEE (Knees) ×1 IMPLANT
TRAY FOLEY MTR SLVR 16FR STAT (SET/KITS/TRAYS/PACK) IMPLANT
TUBE SUCTION HIGH CAP CLEAR NV (SUCTIONS) ×1 IMPLANT
WATER STERILE IRR 1000ML POUR (IV SOLUTION) ×2 IMPLANT
WRAP KNEE MAXI GEL POST OP (GAUZE/BANDAGES/DRESSINGS) ×1 IMPLANT

## 2023-09-14 NOTE — Anesthesia Preprocedure Evaluation (Signed)
Anesthesia Evaluation  Patient identified by MRN, date of birth, ID band Patient awake    Reviewed: Allergy & Precautions, H&P , NPO status , Patient's Chart, lab work & pertinent test results  History of Anesthesia Complications (+) PONV and history of anesthetic complications  Airway Mallampati: II  TM Distance: <3 FB Neck ROM: Full    Dental no notable dental hx.    Pulmonary sleep apnea    Pulmonary exam normal breath sounds clear to auscultation       Cardiovascular hypertension, Normal cardiovascular exam Rhythm:Regular Rate:Normal     Neuro/Psych negative neurological ROS  negative psych ROS   GI/Hepatic Neg liver ROS,GERD  Medicated,,  Endo/Other  diabetes  Class 3 obesity  Renal/GU negative Renal ROS  negative genitourinary   Musculoskeletal negative musculoskeletal ROS (+)    Abdominal   Peds negative pediatric ROS (+)  Hematology negative hematology ROS (+)   Anesthesia Other Findings   Reproductive/Obstetrics negative OB ROS                             Anesthesia Physical Anesthesia Plan  ASA: 3  Anesthesia Plan: Spinal   Post-op Pain Management: Regional block*   Induction: Intravenous  PONV Risk Score and Plan: 3 and Ondansetron, Dexamethasone, Propofol infusion and Treatment may vary due to age or medical condition  Airway Management Planned: Simple Face Mask  Additional Equipment:   Intra-op Plan:   Post-operative Plan:   Informed Consent: I have reviewed the patients History and Physical, chart, labs and discussed the procedure including the risks, benefits and alternatives for the proposed anesthesia with the patient or authorized representative who has indicated his/her understanding and acceptance.     Dental advisory given  Plan Discussed with: CRNA and Surgeon  Anesthesia Plan Comments:        Anesthesia Quick Evaluation

## 2023-09-14 NOTE — Transfer of Care (Signed)
Immediate Anesthesia Transfer of Care Note  Patient: Rhonda Patton  Procedure(s) Performed: TOTAL KNEE ARTHROPLASTY (Right: Knee)  Patient Location: PACU  Anesthesia Type:Spinal  Level of Consciousness: sedated  Airway & Oxygen Therapy: Patient Spontanous Breathing and Patient connected to face mask oxygen  Post-op Assessment: Report given to RN and Post -op Vital signs reviewed and stable  Post vital signs: Reviewed and stable  Last Vitals:  Vitals Value Taken Time  BP    Temp    Pulse 50 09/14/23 1316  Resp 12 09/14/23 1316  SpO2 98 % 09/14/23 1316  Vitals shown include unfiled device data.  Last Pain:  Vitals:   09/14/23 1100  TempSrc:   PainSc: Asleep      Patients Stated Pain Goal: 5 (09/14/23 5621)  Complications: No notable events documented.

## 2023-09-14 NOTE — Discharge Instructions (Addendum)
Ollen Gross, MD Total Joint Specialist [facility] [place] [address], [address] [place], [place] [address] [phone]  TOTAL KNEE REPLACEMENT POSTOPERATIVE DIRECTIONS    Knee Rehabilitation, Guidelines Following Surgery  Results after knee surgery are often greatly improved when you follow the exercise, range of motion and muscle strengthening exercises prescribed by your doctor. Safety measures are also important to protect the knee from further injury. If any of these exercises cause you to have increased pain or swelling in your knee joint, decrease the amount until you are comfortable again and slowly increase them. If you have problems or questions, call your caregiver or physical therapist for advice.   BLOOD CLOT PREVENTION Take a 10 mg Xarelto once a day for three weeks following surgery.  You may resume your vitamins/supplements once you have discontinued the Xarelto. Do not take any NSAIDs (Advil, Aleve, Ibuprofen, Meloxicam, etc.) until you have discontinued the Xarelto.   HOME CARE INSTRUCTIONS  Remove items at home which could result in a fall. This includes throw rugs or furniture in walking pathways.  ICE to the affected knee as much as tolerated. Icing helps control swelling. If the swelling is well controlled you will be more comfortable and rehab easier. Continue to use ice on the knee for pain and swelling from surgery. You may notice swelling that will progress down to the foot and ankle. This is normal after surgery. Elevate the leg when you are not up walking on it.    Continue to use the breathing machine which will help keep your temperature down. It is common for your temperature to cycle up and down following surgery, especially at night when you are not up moving around and exerting yourself. The breathing machine keeps your lungs expanded and your temperature down. Do not place pillow under the operative knee, focus on keeping the knee straight  while resting  DIET You may resume your previous home diet once you are discharged from the hospital.  DRESSING / WOUND CARE / SHOWERING Keep your bulky bandage on for 2 days. On the third post-operative day you may remove the Ace bandage and gauze. There is a waterproof adhesive bandage on your skin which will stay in place until your first follow-up appointment. Once you remove this you will not need to place another bandage You may begin showering 3 days following surgery, but do not submerge the incision under water.  ACTIVITY For the first 5 days, the key is rest and control of pain and swelling Do your home exercises twice a day starting on post-operative day 3. On the days you go to physical therapy, just do the home exercises once that day. You should rest, ice and elevate the leg for 50 minutes out of every hour. Get up and walk/stretch for 10 minutes per hour. After 5 days you can increase your activity slowly as tolerated. Walk with your walker as instructed. Use the walker until you are comfortable transitioning to a cane. Walk with the cane in the opposite hand of the operative leg. You may discontinue the cane once you are comfortable and walking steadily. Avoid periods of inactivity such as sitting longer than an hour when not asleep. This helps prevent blood clots.  You may discontinue the knee immobilizer once you are able to perform a straight leg raise while lying down. You may resume a sexual relationship in one month or when given the OK by your doctor.  You may return to work once  you are cleared by your doctor.  Do not drive a car for 6 weeks or until released by your surgeon.  Do not drive while taking narcotics.  TED HOSE STOCKINGS Wear the elastic stockings on both legs for three weeks following surgery during the day. You may remove them at night for sleeping.  WEIGHT BEARING Weight bearing as tolerated with assist device (walker, cane, etc) as directed, use it as  long as suggested by your surgeon or therapist, typically at least 4-6 weeks.  POSTOPERATIVE CONSTIPATION PROTOCOL Constipation - defined medically as fewer than three stools per week and severe constipation as less than one stool per week.  One of the most common issues patients have following surgery is constipation.  Even if you have a regular bowel pattern at home, your normal regimen is likely to be disrupted due to multiple reasons following surgery.  Combination of anesthesia, postoperative narcotics, change in appetite and fluid intake all can affect your bowels.  In order to avoid complications following surgery, here are some recommendations in order to help you during your recovery period.  Colace (docusate) - Pick up an over-the-counter form of Colace or another stool softener and take twice a day as long as you are requiring postoperative pain medications.  Take with a full glass of water daily.  If you experience loose stools or diarrhea, hold the colace until you stool forms back up. If your symptoms do not get better within 1 week or if they get worse, check with your doctor. Dulcolax (bisacodyl) - Pick up over-the-counter and take as directed by the product packaging as needed to assist with the movement of your bowels.  Take with a full glass of water.  Use this product as needed if not relieved by Colace only.  MiraLax (polyethylene glycol) - Pick up over-the-counter to have on hand. MiraLax is a solution that will increase the amount of water in your bowels to assist with bowel movements.  Take as directed and can mix with a glass of water, juice, soda, coffee, or tea. Take if you go more than two days without a movement. Do not use MiraLax more than once per day. Call your doctor if you are still constipated or irregular after using this medication for 7 days in a row.  If you continue to have problems with postoperative constipation, please contact the office for further assistance  and recommendations.  If you experience "the worst abdominal pain ever" or develop nausea or vomiting, please contact the office immediatly for further recommendations for treatment.  ITCHING If you experience itching with your medications, try taking only a single pain pill, or even half a pain pill at a time.  You can also use Benadryl over the counter for itching or also to help with sleep.   MEDICATIONS See your medication summary on the "After Visit Summary" that the nursing staff will review with you prior to discharge.  You may have some home medications which will be placed on hold until you complete the course of blood thinner medication.  It is important for you to complete the blood thinner medication as prescribed by your surgeon.  Continue your approved medications as instructed at time of discharge.  PRECAUTIONS If you experience chest pain or shortness of breath - call 911 immediately for transfer to the hospital emergency department.  If you develop a fever greater that 101 F, purulent drainage from wound, increased redness or drainage from wound, foul odor from the wound/dressing,  or calf pain - CONTACT YOUR SURGEON.                                                   FOLLOW-UP APPOINTMENTS Make sure you keep all of your appointments after your operation with your surgeon and caregivers. You should call the office at the above phone number and make an appointment for approximately two weeks after the date of your surgery or on the date instructed by your surgeon outlined in the "After Visit Summary".  RANGE OF MOTION AND STRENGTHENING EXERCISES  Rehabilitation of the knee is important following a knee injury or an operation. After just a few days of immobilization, the muscles of the thigh which control the knee become weakened and shrink (atrophy). Knee exercises are designed to build up the tone and strength of the thigh muscles and to improve knee motion. Often times heat used for  twenty to thirty minutes before working out will loosen up your tissues and help with improving the range of motion but do not use heat for the first two weeks following surgery. These exercises can be done on a training (exercise) mat, on the floor, on a table or on a bed. Use what ever works the best and is most comfortable for you Knee exercises include:  Leg Lifts - While your knee is still immobilized in a splint or cast, you can do straight leg raises. Lift the leg to 60 degrees, hold for 3 sec, and slowly lower the leg. Repeat 10-20 times 2-3 times daily. Perform this exercise against resistance later as your knee gets better.  Quad and Hamstring Sets - Tighten up the muscle on the front of the thigh (Quad) and hold for 5-10 sec. Repeat this 10-20 times hourly. Hamstring sets are done by pushing the foot backward against an object and holding for 5-10 sec. Repeat as with quad sets.  Leg Slides: Lying on your back, slowly slide your foot toward your buttocks, bending your knee up off the floor (only go as far as is comfortable). Then slowly slide your foot back down until your leg is flat on the floor again. Angel Wings: Lying on your back spread your legs to the side as far apart as you can without causing discomfort.  A rehabilitation program following serious knee injuries can speed recovery and prevent re-injury in the future due to weakened muscles. Contact your doctor or a physical therapist for more information on knee rehabilitation.   POST-OPERATIVE OPIOID TAPER INSTRUCTIONS: It is important to wean off of your opioid medication as soon as possible. If you do not need pain medication after your surgery it is ok to stop day one. Opioids include: Codeine, Hydrocodone(Norco, Vicodin), Oxycodone(Percocet, oxycontin) and hydromorphone amongst others.  Long term and even short term use of opiods can cause: Increased pain response Dependence Constipation Depression Respiratory depression And  more.  Withdrawal symptoms can include Flu like symptoms Nausea, vomiting And more Techniques to manage these symptoms Hydrate well Eat regular healthy meals Stay active Use relaxation techniques(deep breathing, meditating, yoga) Do Not substitute Alcohol to help with tapering If you have been on opioids for less than two weeks and do not have pain than it is ok to stop all together.  Plan to wean off of opioids This plan should start within one week post op of  your joint replacement. Maintain the same interval or time between taking each dose and first decrease the dose.  Cut the total daily intake of opioids by one tablet each day Next start to increase the time between doses. The last dose that should be eliminated is the evening dose.   IF YOU ARE TRANSFERRED TO A SKILLED REHAB FACILITY If the patient is transferred to a skilled rehab facility following release from the hospital, a list of the current medications will be sent to the facility for the patient to continue.  When discharged from the skilled rehab facility, please have the facility set up the patient's Home Health Physical Therapy prior to being released. Also, the skilled facility will be responsible for providing the patient with their medications at time of release from the facility to include their pain medication, the muscle relaxants, and their blood thinner medication. If the patient is still at the rehab facility at time of the two week follow up appointment, the skilled rehab facility will also need to assist the patient in arranging follow up appointment in our office and any transportation needs.  MAKE SURE YOU:  Understand these instructions.  Get help right away if you are not doing well or get worse.   DENTAL ANTIBIOTICS:  In most cases prophylactic antibiotics for Dental procdeures after total joint surgery are not necessary.  Exceptions are as follows:  1. History of prior total joint infection  2.  Severely immunocompromised (Organ Transplant, cancer chemotherapy, Rheumatoid biologic meds such as Humera)  3. Poorly controlled diabetes (A1C &gt; 8.0, blood glucose over 200)  If you have one of these conditions, contact your surgeon for an antibiotic prescription, prior to your dental procedure.    Pick up stool softner and laxative for home use following surgery while on pain medications. Do not submerge incision under water. Please use good hand washing techniques while changing dressing each day. May shower starting three days after surgery. Please use a clean towel to pat the incision dry following showers. Continue to use ice for pain and swelling after surgery. Do not use any lotions or creams on the incision until instructed by your surgeon.  Information on my medicine - XARELTO (Rivaroxaban)  This medication education was reviewed with me or my healthcare representative as part of my discharge preparation.    Why was Xarelto prescribed for you? Xarelto was prescribed for you to reduce the risk of blood clots forming after orthopedic surgery. The medical term for these abnormal blood clots is venous thromboembolism (VTE).  What do you need to know about xarelto ? Take your Xarelto ONCE DAILY at the same time every day. You may take it either with or without food.  If you have difficulty swallowing the tablet whole, you may crush it and mix in applesauce just prior to taking your dose.  Take Xarelto exactly as prescribed by your doctor and DO NOT stop taking Xarelto without talking to the doctor who prescribed the medication.  Stopping without other VTE prevention medication to take the place of Xarelto may increase your risk of developing a clot.  After discharge, you should have regular check-up appointments with your healthcare provider that is prescribing your Xarelto.    What do you do if you miss a dose? If you miss a dose, take it as soon as you remember  on the same day then continue your regularly scheduled once daily regimen the next day. Do not take two doses of Xarelto on  the same day.   Important Safety Information A possible side effect of Xarelto is bleeding. You should call your healthcare provider right away if you experience any of the following: Bleeding from an injury or your nose that does not stop. Unusual colored urine (red or dark brown) or unusual colored stools (red or black). Unusual bruising for unknown reasons. A serious fall or if you hit your head (even if there is no bleeding).  Some medicines may interact with Xarelto and might increase your risk of bleeding while on Xarelto. To help avoid this, consult your healthcare provider or pharmacist prior to using any new prescription or non-prescription medications, including herbals, vitamins, non-steroidal anti-inflammatory drugs (NSAIDs) and supplements.  This website has more information on Xarelto: VisitDestination.com.br.

## 2023-09-14 NOTE — Interval H&P Note (Signed)
History and Physical Interval Note:  09/14/2023 9:55 AM  Rhonda Patton  has presented today for surgery, with the diagnosis of right knee osteoarthritis.  The various methods of treatment have been discussed with the patient and family. After consideration of risks, benefits and other options for treatment, the patient has consented to  Procedure(s): TOTAL KNEE ARTHROPLASTY (Right) as a surgical intervention.  The patient's history has been reviewed, patient examined, no change in status, stable for surgery.  I have reviewed the patient's chart and labs.  Questions were answered to the patient's satisfaction.     Homero Fellers Ladasha Schnackenberg

## 2023-09-14 NOTE — Anesthesia Procedure Notes (Signed)
Anesthesia Regional Block: Adductor canal block   Pre-Anesthetic Checklist: , timeout performed,  Correct Patient, Correct Site, Correct Laterality,  Correct Procedure, Correct Position, site marked,  Risks and benefits discussed,  Surgical consent,  Pre-op evaluation,  At surgeon's request and post-op pain management  Laterality: Right  Prep: chloraprep       Needles:  Injection technique: Single-shot  Needle Type: Echogenic Needle     Needle Length: 9cm      Additional Needles:   Procedures:,,,, ultrasound used (permanent image in chart),,    Narrative:  Start time: 09/14/2023 10:51 AM End time: 09/14/2023 10:59 AM Injection made incrementally with aspirations every 5 mL.  Performed by: Personally  Anesthesiologist: Eilene Ghazi, MD  Additional Notes: Patient tolerated the procedure well without complications

## 2023-09-14 NOTE — Progress Notes (Signed)
Orthopedic Tech Progress Note Patient Details:  Rhonda Patton August 29, 1961 [id number]  CPM Right Knee CPM Right Knee: Off Right Knee Flexion (Degrees): 40 Right Knee Extension (Degrees): 10  Post Interventions Patient Tolerated: Well CPM removed by PT. Darleen Crocker 09/14/2023, 5:13 PM

## 2023-09-14 NOTE — Op Note (Signed)
OPERATIVE REPORT-TOTAL KNEE ARTHROPLASTY   Pre-operative diagnosis- Osteoarthritis  Right knee(s)  Post-operative diagnosis- Osteoarthritis Right knee(s)  Procedure-  Right  Total Knee Arthroplasty  Surgeon- Rhonda Patton. Nayleen Janosik, MD  Assistant- [name], PA-C   Anesthesia-   Adductor canal block and spinal  EBL- 25 ml   Drains None  Tourniquet time-  Total Tourniquet Time Documented: Thigh (Right) - 39 minutes Total: Thigh (Right) - 39 minutes     Complications- None  Condition-PACU - hemodynamically stable.   Brief Clinical Note  Rhonda Patton is a 62 y.o. year old female with end stage OA of her right knee with progressively worsening pain and dysfunction. She has constant pain, with activity and at rest and significant functional deficits with difficulties even with ADLs. She has had extensive non-op management including analgesics, injections of cortisone and viscosupplements, and home exercise program, but remains in significant pain with significant dysfunction.Radiographs show bone on bone arthritis medial and patellofemoral. She presents now for right Total Knee Arthroplasty.     Procedure in detail---   The patient is brought into the operating room and positioned supine on the operating table. After successful administration of  Adductor canal block and spinal,   a tourniquet is placed high on the  Right thigh(s) and the lower extremity is prepped and draped in the usual sterile fashion. Time out is performed by the operating team and then the  Right lower extremity is wrapped in Esmarch, knee flexed and the tourniquet inflated to 300 mmHg.       A midline incision is made with a ten blade through the subcutaneous tissue to the level of the extensor mechanism. A fresh blade is used to make a medial parapatellar arthrotomy. Soft tissue over the proximal medial tibia is subperiosteally elevated to the joint line with a knife and into the semimembranosus bursa  with a Cobb elevator. Soft tissue over the proximal lateral tibia is elevated with attention being paid to avoiding the patellar tendon on the tibial tubercle. The patella is everted, knee flexed 90 degrees and the ACL and PCL are removed. Findings are bone on bone medial and patellofemoral with large global osteophytes        The drill is used to create a starting hole in the distal femur and the canal is thoroughly irrigated with sterile saline to remove the fatty contents. The 5 degree Right  valgus alignment guide is placed into the femoral canal and the distal femoral cutting block is pinned to remove 9 mm off the distal femur. Resection is made with an oscillating saw.      The tibia is subluxed forward and the menisci are removed. The extramedullary alignment guide is placed referencing proximally at the medial aspect of the tibial tubercle and distally along the second metatarsal axis and tibial crest. The block is pinned to remove 2mm off the more deficient medial  side. Resection is made with an oscillating saw. Size 5is the most appropriate size for the tibia and the proximal tibia is prepared with the modular drill and keel punch for that size.      The femoral sizing guide is placed and size 5 is most appropriate. Rotation is marked off the epicondylar axis and confirmed by creating a rectangular flexion gap at 90 degrees. The size 5 cutting block is pinned in this rotation and the anterior, posterior and chamfer cuts are made with the oscillating saw. The intercondylar block is then placed and that cut is  made.      Trial size 5 tibial component, trial size 5 posterior stabilized femur and a 7  mm posterior stabilized rotating platform insert trial is placed. Full extension is achieved with excellent varus/valgus and anterior/posterior balance throughout full range of motion. The patella is everted and thickness measured to be 22  mm. Free hand resection is taken to 12 mm, a 35 template is placed,  lug holes are drilled, trial patella is placed, and it tracks normally. Osteophytes are removed off the posterior femur with the trial in place. All trials are removed and the cut bone surfaces prepared with pulsatile lavage. Cement is mixed and once ready for implantation, the size 5 tibial implant, size  5 posterior stabilized femoral component, and the size 35 patella are cemented in place and the patella is held with the clamp. The trial insert is placed and the knee held in full extension. The Exparel (20 ml mixed with 60 ml saline) is injected into the extensor mechanism, posterior capsule, medial and lateral gutters and subcutaneous tissues.  All extruded cement is removed and once the cement is hard the permanent 7 mm posterior stabilized rotating platform insert is placed into the tibial tray.      The wound is copiously irrigated with saline solution and the extensor mechanism closed with # 0 Stratofix suture. The tourniquet is released for a total tourniquet time of 39  minutes. Flexion against gravity is 140 degrees and the patella tracks normally. Subcutaneous tissue is closed with 2.0 vicryl and subcuticular with running 4.0 Monocryl. The incision is cleaned and dried and steri-strips and a bulky sterile dressing are applied. The limb is placed into a knee immobilizer and the patient is awakened and transported to recovery in stable condition.      Please note that a surgical assistant was a medical necessity for this procedure in order to perform it in a safe and expeditious manner. Surgical assistant was necessary to retract the ligaments and vital neurovascular structures to prevent injury to them and also necessary for proper positioning of the limb to allow for anatomic placement of the prosthesis.   Rhonda Patton Germaine Shenker, MD    09/14/2023, 12:47 PM  '

## 2023-09-14 NOTE — Anesthesia Procedure Notes (Signed)
Spinal  Patient location during procedure: OR Start time: 09/14/2023 11:42 AM End time: 09/14/2023 11:45 AM Reason for block: surgical anesthesia Staffing Performed: resident/CRNA  Anesthesiologist: Eilene Ghazi, MD Resident/CRNA: Doran Clay, CRNA Performed by: Doran Clay, CRNA Authorized by: Doran Clay, CRNA   Preanesthetic Checklist Completed: patient identified, IV checked, site marked, risks and benefits discussed, surgical consent, monitors and equipment checked, pre-op evaluation and timeout performed Spinal Block Patient position: sitting Prep: DuraPrep Patient monitoring: heart rate, cardiac monitor, continuous pulse ox and blood pressure Approach: midline Location: L3-4 Injection technique: single-shot Needle Needle type: Sprotte and Pencan  Needle gauge: 24 G Needle length: 10 cm Needle insertion depth: 7 cm Assessment Sensory level: T4 Events: CSF return Additional Notes Timeout performed. Patient in sitting position. L3-4 identified. Cleansed with Duraprep. SAB without difficulty. To supine position.

## 2023-09-14 NOTE — Anesthesia Procedure Notes (Signed)
Anesthesia Procedure Image       

## 2023-09-14 NOTE — Anesthesia Postprocedure Evaluation (Signed)
Anesthesia Post Note  Patient: Rhonda Patton  Procedure(s) Performed: TOTAL KNEE ARTHROPLASTY (Right: Knee)     Patient location during evaluation: PACU Anesthesia Type: Spinal Level of consciousness: oriented and awake and alert Pain management: pain level controlled Vital Signs Assessment: post-procedure vital signs reviewed and stable Respiratory status: spontaneous breathing, respiratory function stable and patient connected to nasal cannula oxygen Cardiovascular status: blood pressure returned to baseline and stable Postop Assessment: no headache, no backache and no apparent nausea or vomiting Anesthetic complications: no  No notable events documented.  Last Vitals:  Vitals:   09/14/23 1315 09/14/23 1330  BP: 105/62 117/66  Pulse: (!) 55 60  Resp: 12 18  Temp: 36.5 C   SpO2: 98% 100%    Last Pain:  Vitals:   09/14/23 1330  TempSrc:   PainSc: Asleep                 Keymani Mclean S

## 2023-09-14 NOTE — Progress Notes (Signed)
Orthopedic Tech Progress Note Patient Details:  Rhonda Patton [birth year] [id number]  CPM Right Knee CPM Right Knee: On Right Knee Flexion (Degrees): 40 Right Knee Extension (Degrees): 10  Post Interventions Patient Tolerated: Well  Darleen Crocker 09/14/2023, 1:41 PM

## 2023-09-14 NOTE — Evaluation (Signed)
Physical Therapy Evaluation Patient Details Name: Rhonda Patton MRN: [id number] DOB: November 14, 1960 Today's Date: 09/14/2023  History of Present Illness  Pt is 62 yo female s/p R TKA  on 09/14/23.  Pt with hx including but not limited to L TKA, LTKA revision (5/23), HLD, CKD, HTN, R achilles tendon contracture - gastrocnemius recession and PF release 2022, , OA, DM  Clinical Impression  Pt is s/p TKA resulting in the deficits listed below (see PT Problem List). 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 She was using a cane for ambulation.  Today, pt seen on DOS.  She did still have some LE numbness that limited her progression today.  Able to sit EOB with min A but did not attempt standing due to effects of spinal.  Pt expected to progress well as spinal continues to decrease.  Pt will benefit from acute skilled PT to increase their independence and safety with mobility to allow discharge.          If plan is discharge home, recommend the following: A lot of help with bathing/dressing/bathroom;A lot of help with walking and/or transfers;Assistance with cooking/housework;Help with stairs or ramp for entrance   Can travel by private vehicle        Equipment Recommendations None recommended by PT  Recommendations for Other Services       Functional Status Assessment Patient has had a recent decline in their functional status and demonstrates the ability to make significant improvements in function in a reasonable and predictable amount of time.     Precautions / Restrictions Precautions Precautions: Fall;Knee Restrictions Weight Bearing Restrictions: Yes RLE Weight Bearing: Weight bearing as tolerated      Mobility  Bed Mobility Overal bed mobility: Needs Assistance Bed Mobility: Supine to Sit, Sit to Supine     Supine to sit: Min assist Sit to supine: Min assist   General bed mobility comments: min A for bil LE    Transfers                    General transfer comment: unable due to still with effects of spinal;    Ambulation/Gait                  Stairs            Wheelchair Mobility     Tilt Bed    Modified Rankin (Stroke Patients Only)       Balance Overall balance assessment: Needs assistance Sitting-balance support: No upper extremity supported Sitting balance-Leahy Scale: Good                                       Pertinent Vitals/Pain Pain Assessment Pain Assessment: 0-10 Pain Score: 8  Pain Location: R knee Pain Descriptors / Indicators: Discomfort Pain Intervention(s): Limited activity within patient's tolerance, Monitored during session, Premedicated before session, Repositioned, Ice applied, Patient requesting pain meds-RN notified    Home Living Family/patient expects to be discharged to:: Private residence Living Arrangements: Spouse/significant other;Children (husband and daughter) Available Help at Discharge: Family;Available 24 hours/day Type of Home: House Home Access: Stairs to enter Entrance Stairs-Rails: Doctor, general practice of Steps: 5   Home Layout: One level Home Equipment: Cane - single Librarian, academic (2 wheels);BSC/3in1      Prior Function Prior Level of Function : Independent/Modified Independent;Driving  Mobility Comments: Using a cane to ambulate; could ambulate in community 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     Extremity/Trunk Assessment   Upper Extremity Assessment Upper Extremity Assessment: Overall WFL for tasks assessed    Lower Extremity Assessment Lower Extremity Assessment: LLE deficits/detail;RLE deficits/detail RLE Deficits / Details: Expected post op changes; ROM : knee 5 to 60 degrees; MMT: ankle 1/5, knee and hip 2/5; Pt still with significantly decreased sensation lower leg, feet, buttock with decreased DF; had spinal LLE Deficits / Details: ROM WFL; MMT: ankle 1/5, knee ext 4/5, hip  4/5; Pt still with significantly decreased sensation lower leg, feet, buttock with decreased DF; had spinal    Cervical / Trunk Assessment Cervical / Trunk Assessment: Normal  Communication      Cognition Arousal: Alert Behavior During Therapy: WFL for tasks assessed/performed Overall Cognitive Status: Within Functional Limits for tasks assessed                                          General Comments General comments (skin integrity, edema, etc.): VSS; encouraged to perform ankle pumps and quad sets once able    Exercises     Assessment/Plan    PT Assessment Patient needs continued PT services  PT Problem List Decreased strength;Pain;Decreased range of motion;Decreased activity tolerance;Decreased balance;Decreased mobility;Decreased knowledge of use of DME       PT Treatment Interventions Therapeutic exercise;DME instruction;Gait training;Balance training;Stair training;Functional mobility training;Therapeutic activities;Patient/family education;Modalities    PT Goals (Current goals can be found in the Care Plan section)  Acute Rehab PT Goals Patient Stated Goal: return home PT Goal Formulation: With patient Time For Goal Achievement: 09/28/23 Potential to Achieve Goals: Good    Frequency 7X/week     Co-evaluation               AM-PAC PT "6 Clicks" Mobility  Outcome Measure Help needed turning from your back to your side while in a flat bed without using bedrails?: A Little Help needed moving from lying on your back to sitting on the side of a flat bed without using bedrails?: A Little Help needed moving to and from a bed to a chair (including a wheelchair)?: Total Help needed standing up from a chair using your arms (e.g., wheelchair or bedside chair)?: Total Help needed to walk in hospital room?: Total Help needed climbing 3-5 steps with a railing? : Total 6 Click Score: 10    End of Session   Activity Tolerance: Treatment limited  secondary to medical complications (Comment) (still numb; only dangled) Patient left: in bed;with call bell/phone within reach;with SCD's reapplied;with bed alarm set Nurse Communication: Mobility status PT Visit Diagnosis: Other abnormalities of gait and mobility (R26.89);Muscle weakness (generalized) (M62.81);Pain Pain - Right/Left: Right Pain - part of body: Knee    Time: 4098-1191 PT Time Calculation (min) (ACUTE ONLY): 20 min   Charges:   PT Evaluation $PT Eval Low Complexity: 1 Low   PT General Charges $$ ACUTE PT VISIT: 1 Visit       Anise Salvo, PT Acute Rehab [facility] [phone]   Rayetta Humphrey 09/14/2023, 5:05 PM

## 2023-09-14 NOTE — Progress Notes (Signed)
RN pulled 2mg  tablet of PO Dilaudid from medication room, RN entered 1mg  for amount pulled in the Pyxis on accident, RN administered entire 2mg  tablet to patient and documented accordingly in Epic, RN documented 0 waste in [employer] with charge RN Tu.

## 2023-09-15 ENCOUNTER — Encounter (HOSPITAL_COMMUNITY): Payer: Self-pay | Admitting: Orthopedic Surgery

## 2023-09-15 DIAGNOSIS — Z886 Allergy status to analgesic agent status: Secondary | ICD-10-CM | POA: Diagnosis not present

## 2023-09-15 DIAGNOSIS — Z90711 Acquired absence of uterus with remaining cervical stump: Secondary | ICD-10-CM | POA: Diagnosis not present

## 2023-09-15 DIAGNOSIS — N189 Chronic kidney disease, unspecified: Secondary | ICD-10-CM | POA: Diagnosis present

## 2023-09-15 DIAGNOSIS — Z6841 Body Mass Index (BMI) 40.0 and over, adult: Secondary | ICD-10-CM | POA: Diagnosis not present

## 2023-09-15 DIAGNOSIS — M1711 Unilateral primary osteoarthritis, right knee: Secondary | ICD-10-CM | POA: Diagnosis present

## 2023-09-15 DIAGNOSIS — I130 Hypertensive heart and chronic kidney disease with heart failure and stage 1 through stage 4 chronic kidney disease, or unspecified chronic kidney disease: Secondary | ICD-10-CM | POA: Diagnosis present

## 2023-09-15 DIAGNOSIS — I080 Rheumatic disorders of both mitral and aortic valves: Secondary | ICD-10-CM | POA: Diagnosis present

## 2023-09-15 DIAGNOSIS — E7800 Pure hypercholesterolemia, unspecified: Secondary | ICD-10-CM | POA: Diagnosis present

## 2023-09-15 DIAGNOSIS — E669 Obesity, unspecified: Secondary | ICD-10-CM | POA: Diagnosis present

## 2023-09-15 DIAGNOSIS — K219 Gastro-esophageal reflux disease without esophagitis: Secondary | ICD-10-CM | POA: Diagnosis present

## 2023-09-15 DIAGNOSIS — Z885 Allergy status to narcotic agent status: Secondary | ICD-10-CM | POA: Diagnosis not present

## 2023-09-15 DIAGNOSIS — Z800 Family history of malignant neoplasm of digestive organs: Secondary | ICD-10-CM | POA: Diagnosis not present

## 2023-09-15 DIAGNOSIS — E1122 Type 2 diabetes mellitus with diabetic chronic kidney disease: Secondary | ICD-10-CM | POA: Diagnosis present

## 2023-09-15 DIAGNOSIS — Z7985 Long-term (current) use of injectable non-insulin antidiabetic drugs: Secondary | ICD-10-CM | POA: Diagnosis not present

## 2023-09-15 DIAGNOSIS — Z79899 Other long term (current) drug therapy: Secondary | ICD-10-CM | POA: Diagnosis not present

## 2023-09-15 DIAGNOSIS — Z7984 Long term (current) use of oral hypoglycemic drugs: Secondary | ICD-10-CM | POA: Diagnosis not present

## 2023-09-15 DIAGNOSIS — Z833 Family history of diabetes mellitus: Secondary | ICD-10-CM | POA: Diagnosis not present

## 2023-09-15 DIAGNOSIS — Z96652 Presence of left artificial knee joint: Secondary | ICD-10-CM | POA: Diagnosis present

## 2023-09-15 LAB — CBC
HCT: 36.1 % (ref 36.0–46.0)
Hemoglobin: 12.2 g/dL (ref 12.0–15.0)
MCH: 29.5 pg (ref 26.0–34.0)
MCHC: 33.8 g/dL (ref 30.0–36.0)
MCV: 87.2 fL (ref 80.0–100.0)
Platelets: 173 10*3/uL (ref 150–400)
RBC: 4.14 MIL/uL (ref 3.87–5.11)
RDW: 13.9 % (ref 11.5–15.5)
WBC: 8.4 10*3/uL (ref 4.0–10.5)
nRBC: 0 % (ref 0.0–0.2)

## 2023-09-15 LAB — BASIC METABOLIC PANEL
Anion gap: 9 (ref 5–15)
BUN: 12 mg/dL (ref 8–23)
CO2: 23 mmol/L (ref 22–32)
Calcium: 8.8 mg/dL — ABNORMAL LOW (ref 8.9–10.3)
Chloride: 107 mmol/L (ref 98–111)
Creatinine, Ser: 1.03 mg/dL — ABNORMAL HIGH (ref 0.44–1.00)
GFR, Estimated: >60 mL/min (ref >60)
Glucose, Bld: 120 mg/dL — ABNORMAL HIGH (ref 70–99)
Potassium: 4 mmol/L (ref 3.5–5.1)
Sodium: 139 mmol/L (ref 135–145)

## 2023-09-15 LAB — GLUCOSE, CAPILLARY
Glucose-Capillary: 103 mg/dL — ABNORMAL HIGH (ref 70–99)
Glucose-Capillary: 105 mg/dL — ABNORMAL HIGH (ref 70–99)
Glucose-Capillary: 128 mg/dL — ABNORMAL HIGH (ref 70–99)
Glucose-Capillary: 142 mg/dL — ABNORMAL HIGH (ref 70–99)

## 2023-09-15 MED ORDER — OXYCODONE HCL 5 MG PO TABS
5.0000 mg | ORAL_TABLET | ORAL | Status: DC | PRN
  Administered 2023-09-15 (×2): 5 mg via ORAL
  Filled 2023-09-15 (×2): qty 1

## 2023-09-15 MED ORDER — TRAMADOL HCL 50 MG PO TABS
50.0000 mg | ORAL_TABLET | Freq: Four times a day (QID) | ORAL | Status: DC | PRN
Start: 2023-09-15 — End: 2023-09-17
  Administered 2023-09-15 – 2023-09-17 (×5): 100 mg via ORAL
  Filled 2023-09-15 (×5): qty 2

## 2023-09-15 MED ORDER — SODIUM CHLORIDE 0.9 % IV BOLUS
500.0000 mL | Freq: Once | INTRAVENOUS | Status: AC
  Administered 2023-09-15: 500 mL via INTRAVENOUS

## 2023-09-15 NOTE — Progress Notes (Signed)
Physical Therapy Treatment Patient Details Name: Rhonda Patton MRN: [id number] DOB: 07/02/1961 Today's Date: 09/15/2023   History of Present Illness Pt is 62 yo female s/p R TKA  on 09/14/23.  Pt with hx including but not limited to L TKA, LTKA revision (5/23), HLD, CKD, HTN, R achilles tendon contracture - gastrocnemius recession and PF release 2022, , OA, DM    PT Comments  POD # 1 am session General Comments: AxO x 3 pleasant.  Pt recevied in bed with c/o dizziness.  Nursing Student starting a Bolus during session. Performed TKR TE's while in bed following HEP handout. Pt will need another PT session this afternoon.     If plan is discharge home, recommend the following:     Can travel by private vehicle        Equipment Recommendations  None recommended by PT    Recommendations for Other Services       Precautions / Restrictions Precautions Precautions: Fall;Knee Precaution Comments: no pillow under knee Restrictions Weight Bearing Restrictions: No RLE Weight Bearing: Weight bearing as tolerated           Cognition Arousal: Alert Behavior During Therapy: WFL for tasks assessed/performed Overall Cognitive Status: Within Functional Limits for tasks assessed                                 General Comments: AxO x 3 pleasant.  Pt recevied in bed with c/o dizziness.  Nursing Student starting a Bolus during session.        Exercises  Total Knee Replacement TE's following HEP handout 10 reps B LE ankle pumps 05 reps towel squeezes 05 reps knee presses 05 reps heel slides  05 reps SAQ's 05 reps SLR's 05 reps ABD Educated on use of gait belt to assist with TE's Followed by ICE     General Comments        Pertinent Vitals/Pain Pain Assessment Pain Assessment: 0-10 Pain Score: 8  Pain Location: R knee during exercises Pain Descriptors / Indicators: Discomfort, Grimacing, Operative site guarding Pain Intervention(s): Monitored during  session, Premedicated before session, Repositioned, Ice applied    Home Living                          Prior Function            PT Goals (current goals can now be found in the care plan section) Progress towards PT goals: Progressing toward goals    Frequency    7X/week      PT Plan      Co-evaluation              AM-PAC PT "6 Clicks" Mobility   Outcome Measure  Help needed turning from your back to your side while in a flat bed without using bedrails?: A Little Help needed moving from lying on your back to sitting on the side of a flat bed without using bedrails?: A Little Help needed moving to and from a bed to a chair (including a wheelchair)?: A Lot Help needed standing up from a chair using your arms (e.g., wheelchair or bedside chair)?: A Lot Help needed to walk in hospital room?: A Lot Help needed climbing 3-5 steps with a railing? : Total 6 Click Score: 13    End of Session Equipment Utilized During Treatment: Gait belt Activity Tolerance: Other (comment) (limited Tx due  to Bolus soft BP's) Patient left: in bed;with call bell/phone within reach;with SCD's reapplied;with bed alarm set Nurse Communication: Mobility status PT Visit Diagnosis: Other abnormalities of gait and mobility (R26.89);Muscle weakness (generalized) (M62.81);Pain Pain - Right/Left: Right Pain - part of body: Knee     Time: 1610-9604 PT Time Calculation (min) (ACUTE ONLY): 13 min  Charges:    $Therapeutic Exercise: 8-22 mins PT General Charges $$ ACUTE PT VISIT: 1 Visit                     Felecia Shelling  PTA Acute  Rehabilitation Services Office M-F          [phone]

## 2023-09-15 NOTE — TOC Transition Note (Signed)
Transition of Care [facility]) - CM/SW Discharge Note   Patient Details  Name: SHENEKIA REITSMA MRN: [id number] Date of Birth: [birth year]  Transition of Care [facility]) CM/SW Contact:  Amada Jupiter, LCSW Phone Number: 09/15/2023, 10:36 AM   Clinical Narrative:     Met with pt who confirms she has needed DME in the home.  OPPT already set up with [facility] PT [facility]).  No TOC needs.   Final next level of care: OP Rehab Barriers to Discharge: No Barriers Identified   Patient Goals and CMS Choice      Discharge Placement                         Discharge Plan and Services Additional resources added to the After Visit Summary for                  DME Arranged: N/A DME Agency: NA                  Social Determinants of Health (SDOH) Interventions SDOH Screenings   Food Insecurity: Patient Declined (09/14/2023)  Housing: Patient Declined (09/14/2023)  Transportation Needs: Patient Declined (09/14/2023)  Utilities: Patient Declined (09/14/2023)  Tobacco Use: Low Risk  (09/14/2023)     Readmission Risk Interventions     No data to display

## 2023-09-15 NOTE — Progress Notes (Signed)
I have reviewed and concur with this student's documentation. CI was present during medication pass.  Leodis Sias, RN 09/15/2023 12:00 PM

## 2023-09-15 NOTE — Progress Notes (Signed)
Subjective: 1 Day Post-Op Procedure(s) (LRB): TOTAL KNEE ARTHROPLASTY (Right) Patient reports pain as mild.   Patient seen in rounds by Dr. Lequita Halt. Patient having issues with dizziness, 500 mL bolus ordered. No issues overnight. Denies chest pain/SOB.  We will continue therapy today.  Objective: Vital signs in last 24 hours: Temp:  [97.6 F (36.4 C)-99 F (37.2 C)] 97.9 F (36.6 C) (11/26 0658) Pulse Rate:  [46-64] 50 (11/26 0658) Resp:  [12-20] 16 (11/26 0658) BP: (105-160)/(61-125) 113/64 (11/26 0658) SpO2:  [96 %-100 %] 99 % (11/26 0658) Weight:  [102.5 kg] 102.5 kg (11/25 0938)  Intake/Output from previous day:  Intake/Output Summary (Last 24 hours) at 09/15/2023 0837 Last data filed at 09/15/2023 0600 Gross per 24 hour  Intake 2892.5 ml  Output 2825 ml  Net 67.5 ml     Intake/Output this shift: No intake/output data recorded.  Labs: Recent Labs    09/15/23 0339  HGB 12.2   Recent Labs    09/15/23 0339  WBC 8.4  RBC 4.14  HCT 36.1  PLT 173   Recent Labs    09/15/23 0339  NA 139  K 4.0  CL 107  CO2 23  BUN 12  CREATININE 1.03*  GLUCOSE 120*  CALCIUM 8.8*   No results for input(s): "LABPT", "INR" in the last 72 hours.  Exam: General - Patient is Alert and Oriented Extremity - Neurologically intact Neurovascular intact Sensation intact distally Dorsiflexion/Plantar flexion intact Dressing - dressing C/D/I Motor Function - intact, moving foot and toes well on exam.   Past Medical History:  Diagnosis Date   Arthritis    Carpal tunnel syndrome of right wrist    Chronic pain    Diabetes (HCC)    GERD (gastroesophageal reflux disease)    Hypercholesteremia    Hypertension    PONV (postoperative nausea and vomiting)    Sleep apnea    diagnosed but doesn't use CPAP;     Assessment/Plan: 1 Day Post-Op Procedure(s) (LRB): TOTAL KNEE ARTHROPLASTY (Right) Principal Problem:   OA (osteoarthritis) of knee Active Problems:   Primary  osteoarthritis of right knee  Estimated body mass index is 40.03 kg/m as calculated from the following:   Height as of this encounter: 5\' 3"  (1.6 m).   Weight as of this encounter: 102.5 kg. Advance diet Up with therapy   Patient's anticipated LOS is less than 2 midnights, meeting these requirements: - Younger than 36 - Lives within 1 hour of care - Has a competent adult at home to recover with post-op  - NO history of             - Chronic pain requiring opiods             - Coronary Artery Disease             - Heart failure             - Heart attack             - Stroke             - DVT/VTE             - Cardiac arrhythmia             - Respiratory Failure/COPD             - Renal failure             - Anemia             -  Advanced Liver disease  DVT Prophylaxis - Xarelto Weight bearing as tolerated. Continue therapy.  Plan is to go Home after hospital stay.  500 mL bolus ordered for dizziness, BP soft this AM. If does not improve with extra fluids, will switch her pain medications as next step.  Very unlikely that she will be ready for discharge later today, will likely require additional night stay.  [name], PA-C Orthopedic Surgery [phone] 09/15/2023, 8:37 AM

## 2023-09-15 NOTE — Progress Notes (Signed)
Physical Therapy Treatment Patient Details Name: Rhonda Patton MRN: [id number] DOB: Apr 04, 1961 Today's Date: 09/15/2023   History of Present Illness Pt is 62 yo female s/p R TKA  on 09/14/23.  Pt with hx including but not limited to L TKA, LTKA revision (5/23), HLD, CKD, HTN, R achilles tendon contracture - gastrocnemius recession and PF release 2022, , OA, DM    PT Comments  POD #1 PM Session Pt was seen in bed with family present. Pt still had c/o dizziness, she stated it got worse after her bolus this morning. BP was monitored throughout session: supine BP 131/69, sitting BP 162/84, standing BP 159/81 Pt performed supine to sit with HOB raised and gait belt around R foot. Pt performed sit to stand from elevated bed with RW and CGA. Pt complained of increased dizziness. Pt performed step pivot transfer to recliner where she was repositioned and ice was applied. Will see pt tomorrow.    If plan is discharge home, recommend the following: A lot of help with bathing/dressing/bathroom;A lot of help with walking and/or transfers;Assistance with cooking/housework;Help with stairs or ramp for entrance   Can travel by private vehicle        Equipment Recommendations  None recommended by PT    Recommendations for Other Services       Precautions / Restrictions Precautions Precautions: Fall;Knee Precaution Comments: no pillow under knee Restrictions Weight Bearing Restrictions: No RLE Weight Bearing: Weight bearing as tolerated     Mobility  Bed Mobility Overal bed mobility: Modified Independent       Supine to sit: Modified independent (Device/Increase time)     General bed mobility comments: Increasd time to perform, cues to put both feet on floor    Transfers Overall transfer level: Needs assistance   Transfers: Sit to/from Stand, Bed to chair/wheelchair/BSC Sit to Stand: Contact guard assist, From elevated surface Step pivot transfers: Min assist          General transfer comment: Pt required min assist to move RW and cues to position body before sitting in recliner, bed was elevated during sit to stand. Pt took took 5 steps to turn and sit in recliner.    Ambulation/Gait               General Gait Details: N/A... dizziness   Stairs             Wheelchair Mobility     Tilt Bed    Modified Rankin (Stroke Patients Only)       Balance                                            Cognition Arousal: Alert Behavior During Therapy: WFL for tasks assessed/performed Overall Cognitive Status: Within Functional Limits for tasks assessed                                 General Comments: AxO x 3 pleasant.  Pt in bed, c/o dizziness        Exercises      General Comments        Pertinent Vitals/Pain Pain Assessment Pain Assessment: Faces Faces Pain Scale: Hurts little more Pain Location: R knee at end of session Pain Descriptors / Indicators: Discomfort, Grimacing, Operative site guarding Pain Intervention(s): Monitored during session, Repositioned, Ice  applied    Home Living                          Prior Function            PT Goals (current goals can now be found in the care plan section) Acute Rehab PT Goals Patient Stated Goal: return home PT Goal Formulation: With patient Time For Goal Achievement: 09/28/23 Progress towards PT goals: Progressing toward goals    Frequency    7X/week      PT Plan      Co-evaluation              AM-PAC PT "6 Clicks" Mobility   Outcome Measure  Help needed turning from your back to your side while in a flat bed without using bedrails?: A Little Help needed moving from lying on your back to sitting on the side of a flat bed without using bedrails?: A Little Help needed moving to and from a bed to a chair (including a wheelchair)?: A Little Help needed standing up from a chair using your arms (e.g., wheelchair  or bedside chair)?: A Little Help needed to walk in hospital room?: A Lot Help needed climbing 3-5 steps with a railing? : A Lot 6 Click Score: 16    End of Session Equipment Utilized During Treatment: Gait belt Activity Tolerance: Other (comment) Patient left: with call bell/phone within reach;in chair;with chair alarm set;with nursing/sitter in room Nurse Communication: Mobility status PT Visit Diagnosis: Other abnormalities of gait and mobility (R26.89);Muscle weakness (generalized) (M62.81);Pain Pain - Right/Left: Right Pain - part of body: Knee     Time: 1405-1430 PT Time Calculation (min) (ACUTE ONLY): 25 min  Charges:    $Gait Training: 8-22 mins $Therapeutic Exercise: 8-22 mins $Therapeutic Activity: 8-22 mins PT General Charges $$ ACUTE PT VISIT: 1 Visit                    Lazaro Arms, SPTA 09/15/2023, 2:39 PM

## 2023-09-16 LAB — CBC
HCT: 32.8 % — ABNORMAL LOW (ref 36.0–46.0)
Hemoglobin: 10.8 g/dL — ABNORMAL LOW (ref 12.0–15.0)
MCH: 28.6 pg (ref 26.0–34.0)
MCHC: 32.9 g/dL (ref 30.0–36.0)
MCV: 87 fL (ref 80.0–100.0)
Platelets: 161 10*3/uL (ref 150–400)
RBC: 3.77 MIL/uL — ABNORMAL LOW (ref 3.87–5.11)
RDW: 14.2 % (ref 11.5–15.5)
WBC: 7.8 10*3/uL (ref 4.0–10.5)
nRBC: 0 % (ref 0.0–0.2)

## 2023-09-16 LAB — GLUCOSE, CAPILLARY
Glucose-Capillary: 115 mg/dL — ABNORMAL HIGH (ref 70–99)
Glucose-Capillary: 125 mg/dL — ABNORMAL HIGH (ref 70–99)
Glucose-Capillary: 103 mg/dL — ABNORMAL HIGH (ref 70–99)
Glucose-Capillary: 164 mg/dL — ABNORMAL HIGH (ref 70–99)

## 2023-09-16 MED ORDER — ONDANSETRON HCL 4 MG PO TABS: 4.0000 mg | ORAL_TABLET | Freq: Four times a day (QID) | ORAL | 0 refills | Status: DC | PRN

## 2023-09-16 MED ORDER — HYDROMORPHONE HCL 2 MG PO TABS: 2.0000 mg | ORAL_TABLET | Freq: Four times a day (QID) | ORAL | 0 refills | Status: DC | PRN

## 2023-09-16 MED ORDER — RIVAROXABAN 10 MG PO TABS: 10.0000 mg | ORAL_TABLET | Freq: Every day | ORAL | 0 refills | Status: AC

## 2023-09-16 MED ORDER — METHOCARBAMOL 500 MG PO TABS: 500.0000 mg | ORAL_TABLET | Freq: Four times a day (QID) | ORAL | 0 refills | Status: DC | PRN

## 2023-09-16 MED ORDER — TRAMADOL HCL 50 MG PO TABS: 50.0000 mg | ORAL_TABLET | Freq: Four times a day (QID) | ORAL | 0 refills | Status: DC | PRN

## 2023-09-16 MED ORDER — PROMETHAZINE HCL 25 MG PO TABS: 25.0000 mg | ORAL_TABLET | Freq: Four times a day (QID) | ORAL | Status: DC | PRN

## 2023-09-16 MED ORDER — HYDROMORPHONE HCL 2 MG PO TABS
2.0000 mg | ORAL_TABLET | ORAL | Status: DC | PRN
  Administered 2023-09-16: 2 mg via ORAL
  Filled 2023-09-16: qty 1

## 2023-09-16 NOTE — Progress Notes (Signed)
Orthopedic Tech Progress Note Patient Details:  Rhonda Patton May 05, 1961 [id number]  CPM Right Knee CPM Right Knee: On Right Knee Flexion (Degrees): 40 Right Knee Extension (Degrees): 10  Post Interventions Patient Tolerated: Well Instructions Provided: Adjustment of device, Care of device  Kizzie Fantasia 09/16/2023, 3:22 PM

## 2023-09-16 NOTE — Progress Notes (Signed)
Physical Therapy Treatment Patient Details Name: Rhonda Patton MRN: [id number] DOB: [birth year] Today's Date: 09/16/2023   History of Present Illness Pt is 62 yo female s/p R TKA  on 09/14/23.  Pt with hx including but not limited to L TKA, LTKA revision (5/23), HLD, CKD, HTN, R achilles tendon contracture - gastrocnemius recession and PF release 2022, , OA, DM    PT Comments  POD #2  Pt stated she felt drowsy/sleepy this morning. She also stated that she felt dizzy still. Pt performed supine to sit with min assist to move and support her R LE. Pt BP seated: 141/75. Pt performed sit to stand with elevated bed and RW. Pt BP standing: 132/69. Pt ambulated 8 ft into hallway with RW and CGA, she did stair training with two steps and bil rails. She required CGA and cueing for LE sequence and how family can assist. Pt ambulated 22 ft to recliner and sat with R LE supported. Pt was wheeled back to room where ice was applied. Pt dizziness was monitored throughout session and was minimal/tolerable. All mobility was safe and functional. Pt has met mobility goals for D/C.   If plan is discharge home, recommend the following: A lot of help with bathing/dressing/bathroom;A lot of help with walking and/or transfers;Assistance with cooking/housework;Help with stairs or ramp for entrance   Can travel by private vehicle        Equipment Recommendations  None recommended by PT    Recommendations for Other Services       Precautions / Restrictions Precautions Precautions: Fall;Knee Precaution Comments: no pillow under knee Restrictions Weight Bearing Restrictions: No RLE Weight Bearing: Weight bearing as tolerated     Mobility  Bed Mobility Overal bed mobility: Needs Assistance Bed Mobility: Supine to Sit     Supine to sit: Min assist     General bed mobility comments: Assist to move R LE off EOB, manual support of R knee when lowering to ground, use of gait belt around R foot.     Transfers Overall transfer level: Needs assistance Equipment used: Rolling walker (2 wheels) Transfers: Sit to/from Stand Sit to Stand: Contact guard assist, From elevated surface           General transfer comment: Bed elevated, cues to push off bed and put R foot out when standing and sitting    Ambulation/Gait Ambulation/Gait assistance: Contact guard assist Gait Distance (Feet): 30 Feet Assistive device: Rolling walker (2 wheels) Gait Pattern/deviations: Step-through pattern, Decreased stance time - right, Wide base of support Gait velocity: decreased     General Gait Details: Slow, careful but alternating   Stairs Stairs: Yes Stairs assistance: Contact guard assist Stair Management: Two rails, Step to pattern, Forwards Number of Stairs: 2 General stair comments: Slow, cueing for correct LE sequence, education on walker positioning and how family can assist.   Wheelchair Mobility     Tilt Bed    Modified Rankin (Stroke Patients Only)       Balance                                            Cognition Arousal: Alert Behavior During Therapy: WFL for tasks assessed/performed Overall Cognitive Status: Within Functional Limits for tasks assessed  General Comments: AxO x 3 pleasant. a little droggy/sleepy        Exercises      General Comments        Pertinent Vitals/Pain Pain Assessment Pain Score: 8  Faces Pain Scale: Hurts even more Pain Location: R knee Pain Descriptors / Indicators: Discomfort, Grimacing, Operative site guarding, Sore, Guarding Pain Intervention(s): Monitored during session, Repositioned, Ice applied    Home Living                          Prior Function            PT Goals (current goals can now be found in the care plan section) Acute Rehab PT Goals Patient Stated Goal: return home PT Goal Formulation: With patient Time For Goal  Achievement: 09/28/23 Progress towards PT goals: Progressing toward goals    Frequency    7X/week      PT Plan      Co-evaluation              AM-PAC PT "6 Clicks" Mobility   Outcome Measure  Help needed turning from your back to your side while in a flat bed without using bedrails?: A Little Help needed moving from lying on your back to sitting on the side of a flat bed without using bedrails?: A Little Help needed moving to and from a bed to a chair (including a wheelchair)?: A Little Help needed standing up from a chair using your arms (e.g., wheelchair or bedside chair)?: A Little Help needed to walk in hospital room?: A Little Help needed climbing 3-5 steps with a railing? : A Lot 6 Click Score: 17    End of Session Equipment Utilized During Treatment: Gait belt   Patient left: with call bell/phone within reach;in chair;with chair alarm set Nurse Communication: Mobility status PT Visit Diagnosis: Other abnormalities of gait and mobility (R26.89);Muscle weakness (generalized) (M62.81);Pain Pain - Right/Left: Right Pain - part of body: Knee     Time: 8841-6606 PT Time Calculation (min) (ACUTE ONLY): 28 min  Charges:    $Gait Training: 8-22 mins $Therapeutic Activity: 8-22 mins PT General Charges $$ ACUTE PT VISIT: 1 Visit            Lazaro Arms, SPTA 09/16/2023, 12:20 PM

## 2023-09-16 NOTE — Progress Notes (Signed)
   Subjective: 2 Days Post-Op Procedure(s) (LRB): TOTAL KNEE ARTHROPLASTY (Right) Patient seen in rounds by Dr. Lequita Halt. Patient doing fair this AM. Continues to endorse dizziness as well as severe pain. Denies N/V or changes in vision. Denies chest pain or SOB.  Objective: Vital signs in last 24 hours: Temp:  [97.5 F (36.4 C)-99.8 F (37.7 C)] 99.8 F (37.7 C) (11/27 0625) Pulse Rate:  [48-76] 76 (11/27 0625) Resp:  [15-20] 16 (11/27 0625) BP: (110-122)/(57-68) 122/57 (11/27 0625) SpO2:  [97 %-100 %] 97 % (11/27 0625)  Intake/Output from previous day:  Intake/Output Summary (Last 24 hours) at 09/16/2023 0746 Last data filed at 09/15/2023 2112 Gross per 24 hour  Intake 780 ml  Output 1600 ml  Net -820 ml    Intake/Output this shift: No intake/output data recorded.  Labs: Recent Labs    09/15/23 0339 09/16/23 0351  HGB 12.2 10.8*   Recent Labs    09/15/23 0339 09/16/23 0351  WBC 8.4 7.8  RBC 4.14 3.77*  HCT 36.1 32.8*  PLT 173 161   Recent Labs    09/15/23 0339  NA 139  K 4.0  CL 107  CO2 23  BUN 12  CREATININE 1.03*  GLUCOSE 120*  CALCIUM 8.8*   No results for input(s): "LABPT", "INR" in the last 72 hours.  Exam: General - Patient is Alert and Oriented Extremity - Neurologically intact Neurovascular intact Sensation intact distally Dorsiflexion/Plantar flexion intact Dressing/Incision - clean, dry, no drainage Motor Function - intact, moving foot and toes well on exam.  Past Medical History:  Diagnosis Date   Arthritis    Carpal tunnel syndrome of right wrist    Chronic pain    Diabetes (HCC)    GERD (gastroesophageal reflux disease)    Hypercholesteremia    Hypertension    PONV (postoperative nausea and vomiting)    Sleep apnea    diagnosed but doesn't use CPAP;     Assessment/Plan: 2 Days Post-Op Procedure(s) (LRB): TOTAL KNEE ARTHROPLASTY (Right) Principal Problem:   OA (osteoarthritis) of knee Active Problems:   Primary  osteoarthritis of right knee  Estimated body mass index is 40.03 kg/m as calculated from the following:   Height as of this encounter: 5\' 3"  (1.6 m).   Weight as of this encounter: 102.5 kg.  DVT Prophylaxis - Xarelto Weight-bearing as tolerated.  Discontinued gabapentin as this is likely cause of dizziness. Will change back to hydromorphone for pain control.  Continue physical therapy. Possible discharge home today pending progress and symptom management but will likely require additional night(s) in hospital.  R. Arcola Jansky, PA-C Orthopedic Surgery 09/16/2023, 7:46 AM

## 2023-09-16 NOTE — Progress Notes (Signed)
Pt has texted a family member to pick up her discharge medications from [facility]. Pharmacy closes at 7pm tonight & will be closed tomorrow. Primary nurse updated

## 2023-09-16 NOTE — Progress Notes (Signed)
Orthopedic Tech Progress Note Patient Details:  Rhonda Patton 05-Jun-1961 [id number]  CPM Right Knee CPM Right Knee: Off Right Knee Flexion (Degrees): 40 Right Knee Extension (Degrees): 10  Post Interventions Patient Tolerated: Well Instructions Provided: Adjustment of device, Care of device  Kizzie Fantasia 09/16/2023, 5:19 PM

## 2023-09-17 LAB — GLUCOSE, CAPILLARY
Glucose-Capillary: 105 mg/dL — ABNORMAL HIGH (ref 70–99)
Glucose-Capillary: 119 mg/dL — ABNORMAL HIGH (ref 70–99)

## 2023-09-17 NOTE — Discharge Summary (Signed)
Physician Discharge Summary  Patient ID: Rhonda Patton MRN: [id number] DOB/AGE: June 13, 1961 62 y.o.  Admit date: 09/14/2023 Discharge date: 09/17/2023  Admission Diagnoses: OA of R knee; hx of carpal tunnel, mitral regurg, aortic valve disorder, SOB, CP, left leg weakness, difficulty walking, Trigger finger, GERD, rectal bleeding, dysphagia, abd pain, rectal polyp, gastritis due to NSAID use, R achilles tendinopathy, plantar fasciitis, obesity, DM, HA, HTN, CKD, allergic rhinitis, sinusitis, and papilloma.  Discharge Diagnoses:  Principal Problem:   OA (osteoarthritis) of knee Active Problems:   Primary osteoarthritis of right knee Same as above  Discharged Condition: stable  Hospital Course: Patient presented to [facility] OR on 09/14/23 for elective right total knee arthroplasty by Dr. Lequita Halt.  She tolerated the procedure well without difficulty.  She was then admitted to the hospital.  Early she experienced some dizziness that eventually resolved.  Otherwise she tolerated her stay well without difficulty.  She worked well with therapy.  She is to be D/C'd home.  Consults:  PT  Significant Diagnostic Studies: N/A  Treatments: IV hydration, antibiotics: Ancef, analgesia: acetaminophen, Dilaudid, and tramadol, cardiac meds: HCTZ, anticoagulation: xarelto, and surgery: as stated above  Discharge Exam: Blood pressure 131/70, pulse 77, temperature 99.2 F (37.3 C), temperature source Oral, resp. rate 18, height 5\' 3"  (1.6 m), weight 102.5 kg, SpO2 95%. General: WDWN patient in NAD. Psych:  Appropriate mood and affect. Neuro:  A&O x 3, Moving all extremities, sensation intact to light touch HEENT:  EOMs intact Chest:  Even non-labored respirations Skin:  Dressing C/D/I, no rashes or lesions Extremities: warm/dry, mild edema to R knee, no erythema or echymosis.  No lymphadenopathy. Pulses: Popliteus 2+ MSK:  ROM: lacks 5 degrees TKE, MMT: able to perform quad set, (-) Homan's    Disposition: Discharge disposition: 01-Home or Self Care       Discharge Instructions     Call MD / Call 911   Complete by: As directed    If you experience chest pain or shortness of breath, CALL 911 and be transported to the hospital emergency room.  If you develope a fever above 101 F, pus (white drainage) or increased drainage or redness at the wound, or calf pain, call your surgeon's office.   Call MD / Call 911   Complete by: As directed    If you experience chest pain or shortness of breath, CALL 911 and be transported to the hospital emergency room.  If you develope a fever above 101 F, pus (white drainage) or increased drainage or redness at the wound, or calf pain, call your surgeon's office.   Change dressing   Complete by: As directed    You may remove the bulky bandage (ACE wrap and gauze) two days after surgery. You will have an adhesive waterproof bandage underneath. Leave this in place until your first follow-up appointment.   Constipation Prevention   Complete by: As directed    Drink plenty of fluids.  Prune juice may be helpful.  You may use a stool softener, such as Colace (over the counter) 100 mg twice a day.  Use MiraLax (over the counter) for constipation as needed.   Constipation Prevention   Complete by: As directed    Drink plenty of fluids.  Prune juice may be helpful.  You may use a stool softener, such as Colace (over the counter) 100 mg twice a day.  Use MiraLax (over the counter) for constipation as needed.   Diet - low sodium heart healthy  Complete by: As directed    Diet - low sodium heart healthy   Complete by: As directed    Do not put a pillow under the knee. Place it under the heel.   Complete by: As directed    Driving restrictions   Complete by: As directed    No driving for two weeks   Increase activity slowly as tolerated   Complete by: As directed    Post-operative opioid taper instructions:   Complete by: As directed     POST-OPERATIVE OPIOID TAPER INSTRUCTIONS: It is important to wean off of your opioid medication as soon as possible. If you do not need pain medication after your surgery it is ok to stop day one. Opioids include: Codeine, Hydrocodone(Norco, Vicodin), Oxycodone(Percocet, oxycontin) and hydromorphone amongst others.  Long term and even short term use of opiods can cause: Increased pain response Dependence Constipation Depression Respiratory depression And more.  Withdrawal symptoms can include Flu like symptoms Nausea, vomiting And more Techniques to manage these symptoms Hydrate well Eat regular healthy meals Stay active Use relaxation techniques(deep breathing, meditating, yoga) Do Not substitute Alcohol to help with tapering If you have been on opioids for less than two weeks and do not have pain than it is ok to stop all together.  Plan to wean off of opioids This plan should start within one week post op of your joint replacement. Maintain the same interval or time between taking each dose and first decrease the dose.  Cut the total daily intake of opioids by one tablet each day Next start to increase the time between doses. The last dose that should be eliminated is the evening dose.      Post-operative opioid taper instructions:   Complete by: As directed    POST-OPERATIVE OPIOID TAPER INSTRUCTIONS: It is important to wean off of your opioid medication as soon as possible. If you do not need pain medication after your surgery it is ok to stop day one. Opioids include: Codeine, Hydrocodone(Norco, Vicodin), Oxycodone(Percocet, oxycontin) and hydromorphone amongst others.  Long term and even short term use of opiods can cause: Increased pain response Dependence Constipation Depression Respiratory depression And more.  Withdrawal symptoms can include Flu like symptoms Nausea, vomiting And more Techniques to manage these symptoms Hydrate well Eat regular healthy  meals Stay active Use relaxation techniques(deep breathing, meditating, yoga) Do Not substitute Alcohol to help with tapering If you have been on opioids for less than two weeks and do not have pain than it is ok to stop all together.  Plan to wean off of opioids This plan should start within one week post op of your joint replacement. Maintain the same interval or time between taking each dose and first decrease the dose.  Cut the total daily intake of opioids by one tablet each day Next start to increase the time between doses. The last dose that should be eliminated is the evening dose.      TED hose   Complete by: As directed    Use stockings (TED hose) for three weeks on both leg(s).  You may remove them at night for sleeping.   Weight bearing as tolerated   Complete by: As directed    Weight bearing as tolerated   Complete by: As directed    Laterality: right   Extremity: Lower      Allergies as of 09/17/2023       Reactions   Codeine Shortness Of Breath, Nausea And Vomiting  Aspirin Nausea Only        Medication List     TAKE these medications    acetaminophen 500 MG tablet Commonly known as: TYLENOL Take 500-1,000 mg by mouth every 6 (six) hours as needed for moderate pain (pain score 4-6).   atorvastatin 40 MG tablet Commonly known as: LIPITOR Take 40 mg by mouth at bedtime.   famotidine 40 MG tablet Commonly known as: PEPCID Take 40 mg by mouth at bedtime.   hydrochlorothiazide 25 MG tablet Commonly known as: HYDRODIURIL Take 25 mg by mouth in the morning.   HYDROmorphone 2 MG tablet Commonly known as: DILAUDID Take 1-2 tablets (2-4 mg total) by mouth every 6 (six) hours as needed for severe pain (pain score 7-10).   hydroquinone 4 % cream Apply 1 application  topically at bedtime.   levocetirizine 5 MG tablet Commonly known as: XYZAL Take 5 mg by mouth daily as needed for allergies.   methocarbamol 500 MG tablet Commonly known as:  ROBAXIN Take 1 tablet (500 mg total) by mouth every 6 (six) hours as needed for muscle spasms. What changed:  how much to take when to take this   Mounjaro 5 MG/0.5ML Pen Generic drug: tirzepatide Inject 5 mg into the skin every Friday.   ondansetron 4 MG tablet Commonly known as: ZOFRAN Take 1 tablet (4 mg total) by mouth every 6 (six) hours as needed for nausea.   pantoprazole 40 MG tablet Commonly known as: PROTONIX TAKE 1 TABLET BY MOUTH EVERY DAY   rivaroxaban 10 MG Tabs tablet Commonly known as: XARELTO Take 1 tablet (10 mg total) by mouth daily with breakfast for 19 days.   traMADol 50 MG tablet Commonly known as: ULTRAM Take 1-2 tablets (50-100 mg total) by mouth every 6 (six) hours as needed for moderate pain (pain score 4-6).   VITAMIN D-3 PO Take 1 tablet by mouth every evening.               Discharge Care Instructions  (From admission, onward)           Start     Ordered   09/17/23 0000  Weight bearing as tolerated       Question Answer Comment  Laterality right   Extremity Lower      09/17/23 0839   09/16/23 0000  Weight bearing as tolerated        09/16/23 0751   09/16/23 0000  Change dressing       Comments: You may remove the bulky bandage (ACE wrap and gauze) two days after surgery. You will have an adhesive waterproof bandage underneath. Leave this in place until your first follow-up appointment.   09/16/23 0751            Follow-up Information     Ollen Gross, MD. Schedule an appointment as soon as possible for a visit in 2 week(s).   Specialty: Orthopedic Surgery Contact information: [address] [place] [address] [phone]                 Signed: Lolly Mustache Office:  [phone]

## 2023-09-17 NOTE — Progress Notes (Signed)
Physical Therapy Treatment Patient Details Name: Rhonda Patton MRN: [id number] DOB: 05/20/1961 Today's Date: 09/17/2023   History of Present Illness Pt is 62 yo female s/p R TKA  on 09/14/23.  Pt with hx including but not limited to L TKA, LTKA revision (5/23), HLD, CKD, HTN, R achilles tendon contracture - gastrocnemius recession and PF release 2022, , OA, DM    PT Comments  Pt motivated and progressing steadily with mobility.  Pt performed therex program with assist and up to ambulate increased distance in hall.  Pt c/o mild dizziness throughout and with BP as follows: supine 123/72; sit 131/76; stand 133/83; after ambulating 144/84 - RN aware.    If plan is discharge home, recommend the following: Assistance with cooking/housework;Help with stairs or ramp for entrance;A little help with walking and/or transfers;A little help with bathing/dressing/bathroom   Can travel by private vehicle        Equipment Recommendations  None recommended by PT    Recommendations for Other Services       Precautions / Restrictions Precautions Precautions: Fall;Knee Precaution Comments: no pillow under knee Restrictions Weight Bearing Restrictions: No RLE Weight Bearing: Weight bearing as tolerated     Mobility  Bed Mobility Overal bed mobility: Needs Assistance Bed Mobility: Supine to Sit     Supine to sit: Contact guard     General bed mobility comments: Increased time with pt self assisting R LE with gait belt    Transfers Overall transfer level: Needs assistance Equipment used: Rolling walker (2 wheels) Transfers: Sit to/from Stand Sit to Stand: Contact guard assist, From elevated surface           General transfer comment: Bed elevated, cues to push off bed and put R foot out when standing and sitting    Ambulation/Gait Ambulation/Gait assistance: Contact guard assist, Supervision Gait Distance (Feet): 62 Feet Assistive device: Rolling walker (2 wheels) Gait  Pattern/deviations: Step-to pattern, Decreased step length - left, Decreased step length - right, Shuffle, Trunk flexed Gait velocity: decreased     General Gait Details: cues for sequence, posture and position from RW; distance ltd by fatigue   Stairs         General stair comments: Pt declines to attempt stairs - states feels comfortable with training from yesterday   Wheelchair Mobility     Tilt Bed    Modified Rankin (Stroke Patients Only)       Balance Overall balance assessment: Needs assistance Sitting-balance support: No upper extremity supported Sitting balance-Leahy Scale: Good     Standing balance support: Single extremity supported Standing balance-Leahy Scale: Fair                              Cognition Arousal: Alert Behavior During Therapy: WFL for tasks assessed/performed Overall Cognitive Status: Within Functional Limits for tasks assessed                                 General Comments: AxO x 3 pleasant. a little droggy/sleepy        Exercises Total Joint Exercises Ankle Circles/Pumps: AROM, Both, 15 reps, Supine Quad Sets: AROM, Both, 10 reps, Supine Heel Slides: AAROM, Right, 15 reps, Supine Straight Leg Raises: AAROM, Right, 15 reps, Supine    General Comments        Pertinent Vitals/Pain Pain Assessment Pain Assessment: 0-10 Pain Score: 6  Pain Location: R knee Pain Descriptors / Indicators: Aching, Sore, Grimacing Pain Intervention(s): Limited activity within patient's tolerance, Monitored during session, Premedicated before session, Ice applied    Home Living                          Prior Function            PT Goals (current goals can now be found in the care plan section) Acute Rehab PT Goals Patient Stated Goal: return home PT Goal Formulation: With patient Time For Goal Achievement: 09/28/23 Potential to Achieve Goals: Good Progress towards PT goals: Progressing toward  goals    Frequency    7X/week      PT Plan      Co-evaluation              AM-PAC PT "6 Clicks" Mobility   Outcome Measure  Help needed turning from your back to your side while in a flat bed without using bedrails?: A Little Help needed moving from lying on your back to sitting on the side of a flat bed without using bedrails?: A Little Help needed moving to and from a bed to a chair (including a wheelchair)?: A Little Help needed standing up from a chair using your arms (e.g., wheelchair or bedside chair)?: A Little Help needed to walk in hospital room?: A Little Help needed climbing 3-5 steps with a railing? : A Little 6 Click Score: 18    End of Session Equipment Utilized During Treatment: Gait belt Activity Tolerance: Patient tolerated treatment well;Patient limited by fatigue Patient left: with call bell/phone within reach;in chair;with chair alarm set Nurse Communication: Mobility status PT Visit Diagnosis: Other abnormalities of gait and mobility (R26.89);Muscle weakness (generalized) (M62.81);Pain Pain - Right/Left: Right Pain - part of body: Knee     Time: 1022-1055 PT Time Calculation (min) (ACUTE ONLY): 33 min  Charges:    $Gait Training: 8-22 mins $Therapeutic Exercise: 8-22 mins PT General Charges $$ ACUTE PT VISIT: 1 Visit                     Mauro Kaufmann PT Acute Rehabilitation Services Pager [phone] Office [phone]    Rhonda Patton 09/17/2023, 5:05 PM

## 2023-09-17 NOTE — Progress Notes (Signed)
Subjective: 3 Days Post-Op Procedure(s) (LRB): TOTAL KNEE ARTHROPLASTY (Right)  Patient reports pain as mild to moderate.  Reports that she feels much better this morning and dizziness has essentially resolved.  Denies fever, chills, N/V, CP, SOB.  Tolerating POs well.  Admits to flatus.  Believes that she may be ready to go home today.  Objective:   VITALS:  Temp:  [98.7 F (37.1 C)-99.5 F (37.5 C)] 99.2 F (37.3 C) (11/28 0537) Pulse Rate:  [74-77] 77 (11/28 0537) Resp:  [16-18] 18 (11/28 0537) BP: (131-147)/(60-70) 131/70 (11/28 0537) SpO2:  [93 %-95 %] 95 % (11/28 0537)  General: WDWN patient in NAD. Psych:  Appropriate mood and affect. Neuro:  A&O x 3, Moving all extremities, sensation intact to light touch HEENT:  EOMs intact Chest:  Even non-labored respirations Skin:  Dressing C/D/I, no rashes or lesions Extremities: warm/dry, mild edema to R knee, no erythema or echymosis.  No lymphadenopathy. Pulses: Popliteus 2+ MSK:  ROM: lacks 5 degrees TKE, MMT: able to perform quad set, (-) Homan's    LABS Recent Labs    09/15/23 0339 09/16/23 0351  HGB 12.2 10.8*  WBC 8.4 7.8  PLT 173 161   Recent Labs    09/15/23 0339  NA 139  K 4.0  CL 107  CO2 23  BUN 12  CREATININE 1.03*  GLUCOSE 120*   No results for input(s): "LABPT", "INR" in the last 72 hours.   Assessment/Plan: 3 Days Post-Op Procedure(s) (LRB): TOTAL KNEE ARTHROPLASTY (Right)  Patient seen in rounds for Dr. Lysle Morales R LE Up with therapy DVT ppx:  Xarelto Disp:  Home Plan to D/C today after working with therapy.   Alfredo Martinez PA-C [facility] Office:  [phone]

## 2023-09-17 NOTE — Plan of Care (Signed)
  Problem: Education: Goal: Knowledge of General Education information will improve Description: Including pain rating scale, medication(s)/side effects and non-pharmacologic comfort measures Outcome: Adequate for Discharge   Problem: Health Behavior/Discharge Planning: Goal: Ability to manage health-related needs will improve Outcome: Adequate for Discharge   Problem: Clinical Measurements: Goal: Ability to maintain clinical measurements within normal limits will improve Outcome: Adequate for Discharge Goal: Will remain free from infection Outcome: Adequate for Discharge Goal: Diagnostic test results will improve Outcome: Adequate for Discharge Goal: Respiratory complications will improve Outcome: Adequate for Discharge Goal: Cardiovascular complication will be avoided Outcome: Adequate for Discharge   Problem: Activity: Goal: Risk for activity intolerance will decrease 09/17/2023 1308 by Alice Rieger A, LPN Outcome: Adequate for Discharge 09/17/2023 0743 by Delila Spence, LPN Outcome: Progressing   Problem: Nutrition: Goal: Adequate nutrition will be maintained Outcome: Adequate for Discharge   Problem: Coping: Goal: Level of anxiety will decrease Outcome: Adequate for Discharge   Problem: Elimination: Goal: Will not experience complications related to bowel motility Outcome: Adequate for Discharge Goal: Will not experience complications related to urinary retention Outcome: Adequate for Discharge   Problem: Pain Management: Goal: General experience of comfort will improve 09/17/2023 1308 by Delila Spence, LPN Outcome: Adequate for Discharge 09/17/2023 0743 by Delila Spence, LPN Outcome: Progressing   Problem: Safety: Goal: Ability to remain free from injury will improve 09/17/2023 1308 by Delila Spence, LPN Outcome: Adequate for Discharge 09/17/2023 0743 by Delila Spence, LPN Outcome: Progressing   Problem: Skin Integrity: Goal: Risk for  impaired skin integrity will decrease Outcome: Adequate for Discharge   Problem: Education: Goal: Ability to describe self-care measures that may prevent or decrease complications (Diabetes Survival Skills Education) will improve Outcome: Adequate for Discharge Goal: Individualized Educational Video(s) Outcome: Adequate for Discharge   Problem: Coping: Goal: Ability to adjust to condition or change in health will improve Outcome: Adequate for Discharge   Problem: Fluid Volume: Goal: Ability to maintain a balanced intake and output will improve Outcome: Adequate for Discharge   Problem: Health Behavior/Discharge Planning: Goal: Ability to identify and utilize available resources and services will improve Outcome: Adequate for Discharge Goal: Ability to manage health-related needs will improve Outcome: Adequate for Discharge   Problem: Metabolic: Goal: Ability to maintain appropriate glucose levels will improve Outcome: Adequate for Discharge   Problem: Nutritional: Goal: Maintenance of adequate nutrition will improve Outcome: Adequate for Discharge Goal: Progress toward achieving an optimal weight will improve Outcome: Adequate for Discharge   Problem: Skin Integrity: Goal: Risk for impaired skin integrity will decrease Outcome: Adequate for Discharge   Problem: Tissue Perfusion: Goal: Adequacy of tissue perfusion will improve Outcome: Adequate for Discharge   Problem: Education: Goal: Knowledge of the prescribed therapeutic regimen will improve Outcome: Adequate for Discharge Goal: Individualized Educational Video(s) Outcome: Adequate for Discharge   Problem: Activity: Goal: Ability to avoid complications of mobility impairment will improve Outcome: Adequate for Discharge Goal: Range of joint motion will improve Outcome: Adequate for Discharge   Problem: Clinical Measurements: Goal: Postoperative complications will be avoided or minimized Outcome: Adequate for  Discharge   Problem: Pain Management: Goal: Pain level will decrease with appropriate interventions Outcome: Adequate for Discharge   Problem: Skin Integrity: Goal: Will show signs of wound healing Outcome: Adequate for Discharge

## 2023-09-21 DIAGNOSIS — M1711 Unilateral primary osteoarthritis, right knee: Secondary | ICD-10-CM | POA: Diagnosis not present

## 2023-09-28 DIAGNOSIS — M1711 Unilateral primary osteoarthritis, right knee: Secondary | ICD-10-CM | POA: Diagnosis not present

## 2023-09-30 DIAGNOSIS — M1711 Unilateral primary osteoarthritis, right knee: Secondary | ICD-10-CM | POA: Diagnosis not present

## 2023-10-05 DIAGNOSIS — M1711 Unilateral primary osteoarthritis, right knee: Secondary | ICD-10-CM | POA: Diagnosis not present

## 2023-10-06 ENCOUNTER — Encounter (INDEPENDENT_AMBULATORY_CARE_PROVIDER_SITE_OTHER): Payer: Self-pay | Admitting: *Deleted

## 2023-10-12 DIAGNOSIS — M1711 Unilateral primary osteoarthritis, right knee: Secondary | ICD-10-CM | POA: Diagnosis not present

## 2023-10-19 DIAGNOSIS — M1711 Unilateral primary osteoarthritis, right knee: Secondary | ICD-10-CM | POA: Diagnosis not present

## 2023-10-20 DIAGNOSIS — Z4889 Encounter for other specified surgical aftercare: Secondary | ICD-10-CM | POA: Diagnosis not present

## 2023-10-22 DIAGNOSIS — M1711 Unilateral primary osteoarthritis, right knee: Secondary | ICD-10-CM | POA: Diagnosis not present

## 2023-10-29 DIAGNOSIS — M1711 Unilateral primary osteoarthritis, right knee: Secondary | ICD-10-CM | POA: Diagnosis not present

## 2023-12-02 DIAGNOSIS — E559 Vitamin D deficiency, unspecified: Secondary | ICD-10-CM | POA: Diagnosis not present

## 2023-12-02 DIAGNOSIS — I10 Essential (primary) hypertension: Secondary | ICD-10-CM | POA: Diagnosis not present

## 2023-12-02 DIAGNOSIS — E1165 Type 2 diabetes mellitus with hyperglycemia: Secondary | ICD-10-CM | POA: Diagnosis not present

## 2023-12-08 DIAGNOSIS — I10 Essential (primary) hypertension: Secondary | ICD-10-CM | POA: Diagnosis not present

## 2023-12-08 DIAGNOSIS — E1165 Type 2 diabetes mellitus with hyperglycemia: Secondary | ICD-10-CM | POA: Diagnosis not present

## 2023-12-08 DIAGNOSIS — R059 Cough, unspecified: Secondary | ICD-10-CM | POA: Diagnosis not present

## 2023-12-08 DIAGNOSIS — E785 Hyperlipidemia, unspecified: Secondary | ICD-10-CM | POA: Diagnosis not present

## 2023-12-08 DIAGNOSIS — I129 Hypertensive chronic kidney disease with stage 1 through stage 4 chronic kidney disease, or unspecified chronic kidney disease: Secondary | ICD-10-CM | POA: Diagnosis not present

## 2023-12-08 DIAGNOSIS — Z20822 Contact with and (suspected) exposure to covid-19: Secondary | ICD-10-CM | POA: Diagnosis not present

## 2023-12-08 DIAGNOSIS — N189 Chronic kidney disease, unspecified: Secondary | ICD-10-CM | POA: Diagnosis not present

## 2024-01-01 ENCOUNTER — Other Ambulatory Visit: Payer: Self-pay | Admitting: Gastroenterology

## 2024-01-25 ENCOUNTER — Telehealth: Payer: Self-pay

## 2024-01-25 NOTE — Telephone Encounter (Signed)
 Rhonda Patton, This pt called and LMOVM regarding a appt to be seen. Pt cannot have any refills until this happens. She has not been seen since 2022

## 2024-01-28 DIAGNOSIS — H26492 Other secondary cataract, left eye: Secondary | ICD-10-CM | POA: Diagnosis not present

## 2024-02-11 ENCOUNTER — Encounter: Payer: Self-pay | Admitting: Gastroenterology

## 2024-02-11 ENCOUNTER — Ambulatory Visit (INDEPENDENT_AMBULATORY_CARE_PROVIDER_SITE_OTHER): Admitting: Gastroenterology

## 2024-02-11 VITALS — BP 125/83 | HR 78 | Temp 97.8°F | Ht 63.0 in | Wt 241.8 lb

## 2024-02-11 DIAGNOSIS — K5900 Constipation, unspecified: Secondary | ICD-10-CM | POA: Insufficient documentation

## 2024-02-11 DIAGNOSIS — Z800 Family history of malignant neoplasm of digestive organs: Secondary | ICD-10-CM

## 2024-02-11 DIAGNOSIS — K2270 Barrett's esophagus without dysplasia: Secondary | ICD-10-CM

## 2024-02-11 DIAGNOSIS — Z860102 Personal history of hyperplastic colon polyps: Secondary | ICD-10-CM

## 2024-02-11 DIAGNOSIS — R1013 Epigastric pain: Secondary | ICD-10-CM

## 2024-02-11 DIAGNOSIS — Z8719 Personal history of other diseases of the digestive system: Secondary | ICD-10-CM

## 2024-02-11 DIAGNOSIS — K5909 Other constipation: Secondary | ICD-10-CM | POA: Diagnosis not present

## 2024-02-11 DIAGNOSIS — R112 Nausea with vomiting, unspecified: Secondary | ICD-10-CM

## 2024-02-11 DIAGNOSIS — R1310 Dysphagia, unspecified: Secondary | ICD-10-CM | POA: Diagnosis not present

## 2024-02-11 DIAGNOSIS — K219 Gastro-esophageal reflux disease without esophagitis: Secondary | ICD-10-CM

## 2024-02-11 MED ORDER — PANTOPRAZOLE SODIUM 40 MG PO TBEC: 40.0000 mg | DELAYED_RELEASE_TABLET | Freq: Two times a day (BID) | ORAL | 3 refills | Status: DC

## 2024-02-11 MED ORDER — PEG 3350-KCL-NA BICARB-NACL 420 G PO SOLR: 4000.0000 mL | Freq: Once | ORAL | 0 refills | Status: AC

## 2024-02-11 NOTE — Progress Notes (Signed)
 Gastroenterology Office Note    Referring Provider: Omie Bickers, MD Primary Care Physician:  Omie Bickers, MD  Primary GI: Dr. Mordechai April    Chief Complaint   Chief Complaint  Patient presents with   Abdominal Pain    Patient having mid abdominal pain for a couple of months. Hurts after eating. Pain is cramping and sharp shooting pain. Has some nausea. Ran out of pantoprazole  about the same time symptoms started but states she was having some issues when taking the pantoprazole .  Takes famotidine  every night.      History of Present Illness   Rhonda Patton is a 62 y.o. female presenting today with a history of chronic abdominal pain, GERD, Barrett's esophagus diagnosed in 2021 and overdue for surveillance, last seen in Jan 2022.   Notes chronic epigastric pain, all the time underlying and worsened with eating. Mild solid food dysphagia but not as bad as previously (2018 dilation). Intermittent nausea/vomiting, occurring about once a week, usually postprandially. Feels bloated, full, then has to vomit. Feels nauseated after eating. Chronically. Only ate a banana this morning. Feels bloated now. Hx of DM. Last A1c 5.9. Knee surgery in nov 2024, has some hydromorphone  left over that she will take at most every 2 weeks and tries to avoid. Had intentional weight loss so she could have knee surgery. Weight stable. No melena. No NSAIDs, as this causes worsening discomfort. Gallbladder absent.   Pantoprazole  daily but still breakthrough GERD daily. Not eating as much lately. Coffee 1 cup a day, 1 carbonated beverage a day. Notices breakthrough at night.   About a month ago was very constipated. Finally relief with large stool after taking OTC agents. Stomach virus following this and had diarrhea, N/V. Now BM daily but but has sharp pain in lower abdomen. Improvement in lower abdominal pain after bowel movement. Not productive. No rectal bleeding.    CT Jan 2022: thickened gastric antrum  and duodenal bulb suggestive of gastritis/duodenitis, moderate volume of stool.   EGD 2021: +Barrett's, reactive gastropathy, benign-appearing esophageal stenosis, negative celiac, and surveillance due in 2024   EGD in 2018 with benign-appearing esophageal stenosis s/p dilation, small hiatal hernia, mild gastritis due to NSAIDs. Negative H.pylori. She notes improvement with dilation at that time.   Last colonoscopy in 2018 with hyperplastic polyp.    Mother: colon cancer age 23.   Past Medical History:  Diagnosis Date   Arthritis    Carpal tunnel syndrome of right wrist    Chronic pain    Diabetes (HCC)    GERD (gastroesophageal reflux disease)    Hypercholesteremia    Hypertension    PONV (postoperative nausea and vomiting)    Sleep apnea    diagnosed but doesn't use CPAP;     Past Surgical History:  Procedure Laterality Date   ABDOMINAL HYSTERECTOMY     BALLOON DILATION N/A 10/09/2020   Procedure: BALLOON DILATION;  Surgeon: Vinetta Greening, DO;  [facility]: [facility];  Service: Endoscopy;  Laterality: N/A;   BIOPSY  08/25/2017   Procedure: BIOPSY;  Surgeon: Alyce Jubilee, MD;  [facility]: [facility];  Service: Endoscopy;;  gastric biopsy, gastric polyp   BIOPSY  10/09/2020   Procedure: BIOPSY;  Surgeon: Vinetta Greening, DO;  [facility]: [facility];  Service: Endoscopy;;   BREAST BIOPSY Left 09/16/2022   US  LT BREAST BX W LOC DEV 1ST LESION IMG BX SPEC US  GUIDE 09/16/2022 [facility]   BREAST BIOPSY WITH RADIO  FREQUENCY LOCALIZER Left 10/29/2022   Procedure: BREAST BIOPSY WITH RADIO FREQUENCY LOCALIZER;  Surgeon: Marijo Shove, DO;  Location: [facility];  Service: General;  Laterality: Left;   CATARACT EXTRACTION W/PHACO Right 05/01/2023   Procedure: CATARACT EXTRACTION PHACO AND INTRAOCULAR LENS PLACEMENT (IOC);  Surgeon: Ardeth Krabbe, MD;  Location: [facility];  Service: Ophthalmology;  Laterality: Right;  CDE 2.88   CESAREAN SECTION     x 3    CHOLECYSTECTOMY     COLONOSCOPY WITH PROPOFOL  N/A 08/25/2017   TI appeared normal. 5 mm polyp in rectum. Sessile. Redundant colon. Internal hemorrhoids. Hyperplastic.    ESOPHAGOGASTRODUODENOSCOPY (EGD) WITH PROPOFOL  N/A 08/25/2017   benign-apearing esophageal stenosis, dilation, small hiatal hernia, mild gastritis due to NSAIDs. Negative H.pylori.    ESOPHAGOGASTRODUODENOSCOPY (EGD) WITH PROPOFOL  N/A 10/09/2020   Z-line irregular, benign-appearing esophageal stenosis, small hiatal hernia, gastritis, normal duodenum. +Barrett's esophagus, reactive gastropathy with erosions, negative celiac. Surveillance EGD in 2024.    FOOT SURGERY Right    x 2-bone spur   GASTROCNEMIUS RECESSION Right 12/18/2020   Procedure: RIGHT GASTROCNEMIUS RECSSION AND PLANTAR FASCIA RELEASE;  Surgeon: Timothy Ford, MD;  Location: [facility];  Service: Orthopedics;  Laterality: Right;   HAND SURGERY Right    x 2-carpal tunnel   KNEE ARTHROSCOPY Left    KNEE ARTHROSCOPY WITH MEDIAL MENISECTOMY Left 05/07/2017   Procedure: KNEE ARTHROSCOPY WITH MEDIAL MENISECTOMY AND LATERAL MENISECTOMY, limited debriedment;  Surgeon: Darrin Emerald, MD;  Location: [facility];  Service: Orthopedics;  Laterality: Left;   PARTIAL HYSTERECTOMY  2001   total abdominal   POLYPECTOMY  08/25/2017   Procedure: POLYPECTOMY;  Surgeon: Alyce Jubilee, MD;  [facility];  Service: Endoscopy;;  rectal polyp cs   SAVORY DILATION N/A 08/25/2017   Procedure: SAVORY DILATION;  Surgeon: Alyce Jubilee, MD;  [facility];  Service: Endoscopy;  Laterality: N/A;   TOTAL KNEE ARTHROPLASTY Left 09/24/2017   Procedure: TOTAL KNEE ARTHROPLASTY;  Surgeon: Darrin Emerald, MD;  Location: [facility];  Service: Orthopedics;  Laterality: Left;   TOTAL KNEE ARTHROPLASTY Right 09/14/2023   Procedure: TOTAL KNEE ARTHROPLASTY;  Surgeon: Liliane Rei, MD;  Location: [facility];  Service: Orthopedics;  Laterality: Right;   TOTAL KNEE  REVISION Left 03/05/2022   Procedure: Left knee polyethylene revision;  Surgeon: Liliane Rei, MD;  Location: [facility];  Service: Orthopedics;  Laterality: Left;   TRIGGER FINGER RELEASE Right 11/27/2016   Procedure: RELEASE TRIGGER FINGER/A-1 PULLEY RIGHT LONG FINGER;  Surgeon: Darrin Emerald, MD;  Location: [facility];  Service: Orthopedics;  Laterality: Right;  pt knows to arrive at 8:45    Current Outpatient Medications  Medication Sig Dispense Refill   acetaminophen  (TYLENOL ) 500 MG tablet Take 500-1,000 mg by mouth every 6 (six) hours as needed for moderate pain (pain score 4-6).     atorvastatin  (LIPITOR) 40 MG tablet Take 40 mg by mouth at bedtime.     Cholecalciferol (VITAMIN D-3 PO) Take 1 tablet by mouth every evening.     famotidine  (PEPCID ) 40 MG tablet Take 40 mg by mouth at bedtime.     hydrochlorothiazide  (HYDRODIURIL ) 25 MG tablet Take 25 mg by mouth in the morning.  1   HYDROmorphone  (DILAUDID ) 2 MG tablet Take 1-2 tablets (2-4 mg total) by mouth every 6 (six) hours as needed for severe pain (pain score 7-10). 42 tablet 0   hydroquinone 4 % cream Apply 1 application  topically at bedtime.  levocetirizine (XYZAL) 5 MG tablet Take 5 mg by mouth daily as needed for allergies.     pantoprazole  (PROTONIX ) 40 MG tablet TAKE 1 TABLET BY MOUTH EVERY DAY 90 tablet 3   traMADol  (ULTRAM ) 50 MG tablet Take 1-2 tablets (50-100 mg total) by mouth every 6 (six) hours as needed for moderate pain (pain score 4-6). 40 tablet 0   ondansetron  (ZOFRAN ) 4 MG tablet Take 1 tablet (4 mg total) by mouth every 6 (six) hours as needed for nausea. (Patient not taking: Reported on 02/11/2024) 20 tablet 0   No current facility-administered medications for this visit.    Allergies as of 02/11/2024 - Review Complete 02/11/2024  Allergen Reaction Noted   Codeine Shortness Of Breath and Nausea And Vomiting 08/24/2014   Aspirin Nausea Only     Family History  Problem Relation Age of Onset   Cancer  Mother 31 - 45       breast   Colon cancer Mother 102   Heart disease Other    Arthritis Other    Cancer Other    Diabetes Other    Gastric cancer Neg Hx    Esophageal cancer Neg Hx     Social History   Socioeconomic History   Marital status: Divorced    Spouse name: Not on file   Number of children: Not on file   Years of education: 10   Highest education level: Not on file  Occupational History   Not on file  Tobacco Use   Smoking status: Never   Smokeless tobacco: Never  Vaping Use   Vaping status: Never Used  Substance and Sexual Activity   Alcohol use: No   Drug use: No   Sexual activity: Not Currently    Birth control/protection: Surgical  Other Topics Concern   Not on file  Social History Narrative   Not on file   Social Drivers of Health   Financial Resource Strain: Not on file  Food Insecurity: Patient Declined (09/14/2023)   Hunger Vital Sign    Worried About Running Out of Food in the Last Year: Patient declined    Ran Out of Food in the Last Year: Patient declined  Transportation Needs: Patient Declined (09/14/2023)   PRAPARE - Administrator, Civil Service (Medical): Patient declined    Lack of Transportation (Non-Medical): Patient declined  Physical Activity: Not on file  Stress: Not on file  Social Connections: Not on file  Intimate Partner Violence: Patient Declined (09/14/2023)   Humiliation, Afraid, Rape, and Kick questionnaire    Fear of Current or Ex-Partner: Patient declined    Emotionally Abused: Patient declined    Physically Abused: Patient declined    Sexually Abused: Patient declined     Review of Systems   Gen: Denies any fever, chills, fatigue, weight loss, lack of appetite.  CV: Denies chest pain, heart palpitations, peripheral edema, syncope.  Resp: Denies shortness of breath at rest or with exertion. Denies wheezing or cough.  GI: Denies dysphagia or odynophagia. Denies jaundice, hematemesis, fecal  incontinence. GU : Denies urinary burning, urinary frequency, urinary hesitancy MS: Denies joint pain, muscle weakness, cramps, or limitation of movement.  Derm: Denies rash, itching, dry skin Psych: Denies depression, anxiety, memory loss, and confusion Heme: Denies bruising, bleeding, and enlarged lymph nodes.   Physical Exam   BP 125/83   Pulse 78   Temp 97.8 F (36.6 C) (Oral)   Ht 5\' 3"  (1.6 m)   Wt 241 lb  12.8 oz (109.7 kg)   BMI 42.83 kg/m  General:   Alert and oriented. Pleasant and cooperative. Well-nourished and well-developed.  Head:  Normocephalic and atraumatic. Eyes:  Without icterus Ears:  Normal auditory acuity. Lungs:  Clear to auscultation bilaterally.  Heart:  S1, S2 present without murmurs appreciated.  Abdomen:  +BS, soft, non-tender and non-distended. No HSM noted. No guarding or rebound. No masses appreciated.  Rectal:  Deferred  Msk:  Symmetrical without gross deformities. Normal posture. Extremities:  Without edema. Neurologic:  Alert and  oriented x4;  grossly normal neurologically. Skin:  Intact without significant lesions or rashes. Psych:  Alert and cooperative. Normal mood and affect.    Assessment   Rhonda Patton is a 63 y.o. female presenting today with a history of chronic abdominal pain, GERD, Barrett's esophagus diagnosed in 2021, with persistent epigastric pain, N/V.   Dyspepsia: chronic but now always underlying and worsened postprandially, N/V approximately once a week, GERD not controlled despite pantoprazole  once daily, avoiding NSAIDs. Last EGD in 2021 with Barrett's and negative biopsies for celiac. Gallbladder absent. Suspect gastritis, PUD, as culprit for dyspepsia; CT in 2022 with thickened gastric antrum and duodenal bulb suggestive of gastritis/duodenitis, moderate volume of stool. Underlying constipation could be playing a role. Doubt chronic mesenteric ischemia as she has not had unintentional weight loss. Unable to exclude  delayed gastric emptying in setting of diabetes and occasional narcotic use, although A1c controlled.   Constipation: chronic. Lower abdominal pain relieved s/p BM. Will start Linzess 290 mcg daily.  FH colon cancer: overdue for high risk screening. Mother diagnosed at age 82. Last colonoscopy 2018 with hyperplastic polyp. High risk screening to be arranged.   Barrett's: last EGD in 2021. Also noting mild solid food dysphagia and had improvement with dilation in the past. Will arrange EGD/dilation in near future.    EGD in 2018 with benign-appearing esophageal stenosis s/p dilation, small hiatal hernia, mild gastritis due to NSAIDs. Negative H.pylori. She notes improvement with dilation at that time.   Last colonoscopy in 2018 with hyperplastic polyp.    Mother: colon cancer age 54.    PLAN   Increase pantoprazole  to BID  Linzess 290 mcg once daily. Samples provided.   Proceed with colonoscopy/EGD/dilation by Dr. Mordechai April  in near future: the risks, benefits, and alternatives have been discussed with the patient in detail. The patient states understanding and desires to proceed.   Extra 1/2 day of clears  If no significant findings on EGD to explain dyspepsia, recommend CT or CTA  Follow-up after procedures     Delman Ferns, PhD, ANP-BC [facility]

## 2024-02-11 NOTE — H&P (View-Only) (Signed)
 Gastroenterology Office Note    Referring Provider: Omie Bickers, MD Primary Care Physician:  Omie Bickers, MD  Primary GI: Dr. Mordechai April    Chief Complaint   Chief Complaint  Patient presents with   Abdominal Pain    Patient having mid abdominal pain for a couple of months. Hurts after eating. Pain is cramping and sharp shooting pain. Has some nausea. Ran out of pantoprazole  about the same time symptoms started but states she was having some issues when taking the pantoprazole .  Takes famotidine  every night.      History of Present Illness   Rhonda Patton is a 63 y.o. female presenting today with a history of chronic abdominal pain, GERD, Barrett's esophagus diagnosed in 2021 and overdue for surveillance, last seen in Jan 2022.   Notes chronic epigastric pain, all the time underlying and worsened with eating. Mild solid food dysphagia but not as bad as previously (2018 dilation). Intermittent nausea/vomiting, occurring about once a week, usually postprandially. Feels bloated, full, then has to vomit. Feels nauseated after eating. Chronically. Only ate a banana this morning. Feels bloated now. Hx of DM. Last A1c 5.9. Knee surgery in nov 2024, has some hydromorphone  left over that she will take at most every 2 weeks and tries to avoid. Had intentional weight loss so she could have knee surgery. Weight stable. No melena. No NSAIDs, as this causes worsening discomfort. Gallbladder absent.   Pantoprazole  daily but still breakthrough GERD daily. Not eating as much lately. Coffee 1 cup a day, 1 carbonated beverage a day. Notices breakthrough at night.   About a month ago was very constipated. Finally relief with large stool after taking OTC agents. Stomach virus following this and had diarrhea, N/V. Now BM daily but but has sharp pain in lower abdomen. Improvement in lower abdominal pain after bowel movement. Not productive. No rectal bleeding.    CT Jan 2022: thickened gastric antrum  and duodenal bulb suggestive of gastritis/duodenitis, moderate volume of stool.   EGD 2021: +Barrett's, reactive gastropathy, benign-appearing esophageal stenosis, negative celiac, and surveillance due in 2024   EGD in 2018 with benign-appearing esophageal stenosis s/p dilation, small hiatal hernia, mild gastritis due to NSAIDs. Negative H.pylori. She notes improvement with dilation at that time.   Last colonoscopy in 2018 with hyperplastic polyp.    Mother: colon cancer age 23.   Past Medical History:  Diagnosis Date   Arthritis    Carpal tunnel syndrome of right wrist    Chronic pain    Diabetes (HCC)    GERD (gastroesophageal reflux disease)    Hypercholesteremia    Hypertension    PONV (postoperative nausea and vomiting)    Sleep apnea    diagnosed but doesn't use CPAP;     Past Surgical History:  Procedure Laterality Date   ABDOMINAL HYSTERECTOMY     BALLOON DILATION N/A 10/09/2020   Procedure: BALLOON DILATION;  Surgeon: Vinetta Greening, DO;  [facility]: [facility];  Service: Endoscopy;  Laterality: N/A;   BIOPSY  08/25/2017   Procedure: BIOPSY;  Surgeon: Alyce Jubilee, MD;  Location: [facility];  Service: Endoscopy;;  gastric biopsy, gastric polyp   BIOPSY  10/09/2020   Procedure: BIOPSY;  Surgeon: Vinetta Greening, DO;  [facility]: [facility];  Service: Endoscopy;;   BREAST BIOPSY Left 09/16/2022   US  LT BREAST BX W LOC DEV 1ST LESION IMG BX SPEC US  GUIDE 09/16/2022 [facility]   BREAST BIOPSY WITH RADIO  FREQUENCY LOCALIZER Left 10/29/2022   Procedure: BREAST BIOPSY WITH RADIO FREQUENCY LOCALIZER;  Surgeon: Marijo Shove, DO;  Location: [facility];  Service: General;  Laterality: Left;   CATARACT EXTRACTION W/PHACO Right 05/01/2023   Procedure: CATARACT EXTRACTION PHACO AND INTRAOCULAR LENS PLACEMENT (IOC);  Surgeon: Ardeth Krabbe, MD;  Location: [facility];  Service: Ophthalmology;  Laterality: Right;  CDE 2.88   CESAREAN SECTION     x 3    CHOLECYSTECTOMY     COLONOSCOPY WITH PROPOFOL  N/A 08/25/2017   TI appeared normal. 5 mm polyp in rectum. Sessile. Redundant colon. Internal hemorrhoids. Hyperplastic.    ESOPHAGOGASTRODUODENOSCOPY (EGD) WITH PROPOFOL  N/A 08/25/2017   benign-apearing esophageal stenosis, dilation, small hiatal hernia, mild gastritis due to NSAIDs. Negative H.pylori.    ESOPHAGOGASTRODUODENOSCOPY (EGD) WITH PROPOFOL  N/A 10/09/2020   Z-line irregular, benign-appearing esophageal stenosis, small hiatal hernia, gastritis, normal duodenum. +Barrett's esophagus, reactive gastropathy with erosions, negative celiac. Surveillance EGD in 2024.    FOOT SURGERY Right    x 2-bone spur   GASTROCNEMIUS RECESSION Right 12/18/2020   Procedure: RIGHT GASTROCNEMIUS RECSSION AND PLANTAR FASCIA RELEASE;  Surgeon: Timothy Ford, MD;  Location: [facility];  Service: Orthopedics;  Laterality: Right;   HAND SURGERY Right    x 2-carpal tunnel   KNEE ARTHROSCOPY Left    KNEE ARTHROSCOPY WITH MEDIAL MENISECTOMY Left 05/07/2017   Procedure: KNEE ARTHROSCOPY WITH MEDIAL MENISECTOMY AND LATERAL MENISECTOMY, limited debriedment;  Surgeon: Darrin Emerald, MD;  Location: [facility];  Service: Orthopedics;  Laterality: Left;   PARTIAL HYSTERECTOMY  2001   total abdominal   POLYPECTOMY  08/25/2017   Procedure: POLYPECTOMY;  Surgeon: Alyce Jubilee, MD;  [facility];  Service: Endoscopy;;  rectal polyp cs   SAVORY DILATION N/A 08/25/2017   Procedure: SAVORY DILATION;  Surgeon: Alyce Jubilee, MD;  [facility];  Service: Endoscopy;  Laterality: N/A;   TOTAL KNEE ARTHROPLASTY Left 09/24/2017   Procedure: TOTAL KNEE ARTHROPLASTY;  Surgeon: Darrin Emerald, MD;  Location: [facility];  Service: Orthopedics;  Laterality: Left;   TOTAL KNEE ARTHROPLASTY Right 09/14/2023   Procedure: TOTAL KNEE ARTHROPLASTY;  Surgeon: Liliane Rei, MD;  Location: [facility];  Service: Orthopedics;  Laterality: Right;   TOTAL KNEE  REVISION Left 03/05/2022   Procedure: Left knee polyethylene revision;  Surgeon: Liliane Rei, MD;  Location: [facility];  Service: Orthopedics;  Laterality: Left;   TRIGGER FINGER RELEASE Right 11/27/2016   Procedure: RELEASE TRIGGER FINGER/A-1 PULLEY RIGHT LONG FINGER;  Surgeon: Darrin Emerald, MD;  Location: [facility];  Service: Orthopedics;  Laterality: Right;  pt knows to arrive at 8:45    Current Outpatient Medications  Medication Sig Dispense Refill   acetaminophen  (TYLENOL ) 500 MG tablet Take 500-1,000 mg by mouth every 6 (six) hours as needed for moderate pain (pain score 4-6).     atorvastatin  (LIPITOR) 40 MG tablet Take 40 mg by mouth at bedtime.     Cholecalciferol (VITAMIN D-3 PO) Take 1 tablet by mouth every evening.     famotidine  (PEPCID ) 40 MG tablet Take 40 mg by mouth at bedtime.     hydrochlorothiazide  (HYDRODIURIL ) 25 MG tablet Take 25 mg by mouth in the morning.  1   HYDROmorphone  (DILAUDID ) 2 MG tablet Take 1-2 tablets (2-4 mg total) by mouth every 6 (six) hours as needed for severe pain (pain score 7-10). 42 tablet 0   hydroquinone 4 % cream Apply 1 application  topically at bedtime.  levocetirizine (XYZAL) 5 MG tablet Take 5 mg by mouth daily as needed for allergies.     pantoprazole  (PROTONIX ) 40 MG tablet TAKE 1 TABLET BY MOUTH EVERY DAY 90 tablet 3   traMADol  (ULTRAM ) 50 MG tablet Take 1-2 tablets (50-100 mg total) by mouth every 6 (six) hours as needed for moderate pain (pain score 4-6). 40 tablet 0   ondansetron  (ZOFRAN ) 4 MG tablet Take 1 tablet (4 mg total) by mouth every 6 (six) hours as needed for nausea. (Patient not taking: Reported on 02/11/2024) 20 tablet 0   No current facility-administered medications for this visit.    Allergies as of 02/11/2024 - Review Complete 02/11/2024  Allergen Reaction Noted   Codeine Shortness Of Breath and Nausea And Vomiting 08/24/2014   Aspirin Nausea Only     Family History  Problem Relation Age of Onset   Cancer  Mother 41 - 65       breast   Colon cancer Mother 37   Heart disease Other    Arthritis Other    Cancer Other    Diabetes Other    Gastric cancer Neg Hx    Esophageal cancer Neg Hx     Social History   Socioeconomic History   Marital status: Divorced    Spouse name: Not on file   Number of children: Not on file   Years of education: 10   Highest education level: Not on file  Occupational History   Not on file  Tobacco Use   Smoking status: Never   Smokeless tobacco: Never  Vaping Use   Vaping status: Never Used  Substance and Sexual Activity   Alcohol use: No   Drug use: No   Sexual activity: Not Currently    Birth control/protection: Surgical  Other Topics Concern   Not on file  Social History Narrative   Not on file   Social Drivers of Health   Financial Resource Strain: Not on file  Food Insecurity: Patient Declined (09/14/2023)   Hunger Vital Sign    Worried About Running Out of Food in the Last Year: Patient declined    Ran Out of Food in the Last Year: Patient declined  Transportation Needs: Patient Declined (09/14/2023)   PRAPARE - Administrator, Civil Service (Medical): Patient declined    Lack of Transportation (Non-Medical): Patient declined  Physical Activity: Not on file  Stress: Not on file  Social Connections: Not on file  Intimate Partner Violence: Patient Declined (09/14/2023)   Humiliation, Afraid, Rape, and Kick questionnaire    Fear of Current or Ex-Partner: Patient declined    Emotionally Abused: Patient declined    Physically Abused: Patient declined    Sexually Abused: Patient declined     Review of Systems   Gen: Denies any fever, chills, fatigue, weight loss, lack of appetite.  CV: Denies chest pain, heart palpitations, peripheral edema, syncope.  Resp: Denies shortness of breath at rest or with exertion. Denies wheezing or cough.  GI: Denies dysphagia or odynophagia. Denies jaundice, hematemesis, fecal  incontinence. GU : Denies urinary burning, urinary frequency, urinary hesitancy MS: Denies joint pain, muscle weakness, cramps, or limitation of movement.  Derm: Denies rash, itching, dry skin Psych: Denies depression, anxiety, memory loss, and confusion Heme: Denies bruising, bleeding, and enlarged lymph nodes.   Physical Exam   BP 125/83   Pulse 78   Temp 97.8 F (36.6 C) (Oral)   Ht 5\' 3"  (1.6 m)   Wt 241 lb  12.8 oz (109.7 kg)   BMI 42.83 kg/m  General:   Alert and oriented. Pleasant and cooperative. Well-nourished and well-developed.  Head:  Normocephalic and atraumatic. Eyes:  Without icterus Ears:  Normal auditory acuity. Lungs:  Clear to auscultation bilaterally.  Heart:  S1, S2 present without murmurs appreciated.  Abdomen:  +BS, soft, non-tender and non-distended. No HSM noted. No guarding or rebound. No masses appreciated.  Rectal:  Deferred  Msk:  Symmetrical without gross deformities. Normal posture. Extremities:  Without edema. Neurologic:  Alert and  oriented x4;  grossly normal neurologically. Skin:  Intact without significant lesions or rashes. Psych:  Alert and cooperative. Normal mood and affect.    Assessment   Rhonda Patton is a 63 y.o. female presenting today with a history of chronic abdominal pain, GERD, Barrett's esophagus diagnosed in 2021, with persistent epigastric pain, N/V.   Dyspepsia: chronic but now always underlying and worsened postprandially, N/V approximately once a week, GERD not controlled despite pantoprazole  once daily, avoiding NSAIDs. Last EGD in 2021 with Barrett's and negative biopsies for celiac. Gallbladder absent. Suspect gastritis, PUD, as culprit for dyspepsia; CT in 2022 with thickened gastric antrum and duodenal bulb suggestive of gastritis/duodenitis, moderate volume of stool. Underlying constipation could be playing a role. Doubt chronic mesenteric ischemia as she has not had unintentional weight loss. Unable to exclude  delayed gastric emptying in setting of diabetes and occasional narcotic use, although A1c controlled.   Constipation: chronic. Lower abdominal pain relieved s/p BM. Will start Linzess 290 mcg daily.  FH colon cancer: overdue for high risk screening. Mother diagnosed at age 82. Last colonoscopy 2018 with hyperplastic polyp. High risk screening to be arranged.   Barrett's: last EGD in 2021. Also noting mild solid food dysphagia and had improvement with dilation in the past. Will arrange EGD/dilation in near future.    EGD in 2018 with benign-appearing esophageal stenosis s/p dilation, small hiatal hernia, mild gastritis due to NSAIDs. Negative H.pylori. She notes improvement with dilation at that time.   Last colonoscopy in 2018 with hyperplastic polyp.    Mother: colon cancer age 54.    PLAN   Increase pantoprazole  to BID  Linzess 290 mcg once daily. Samples provided.   Proceed with colonoscopy/EGD/dilation by Dr. Mordechai April  in near future: the risks, benefits, and alternatives have been discussed with the patient in detail. The patient states understanding and desires to proceed.   Extra 1/2 day of clears  If no significant findings on EGD to explain dyspepsia, recommend CT or CTA  Follow-up after procedures     Delman Ferns, PhD, ANP-BC [facility]

## 2024-02-11 NOTE — Patient Instructions (Signed)
 We are arranging a colonoscopy, upper endoscopy, and dilation with Dr. Mordechai April in the near future.  We may need to do further imaging like a CT scan if nothing significant is found on the endoscopy.  For constipation, I am starting you on the highest dose of Linzess. Linzess works best when taken once a day every day, on an empty stomach, at least 30 minutes before your first meal of the day.  When Linzess is taken daily as directed:  *Constipation relief is typically felt in about a week *IBS-C patients may begin to experience relief from belly pain and overall abdominal symptoms (pain, discomfort, and bloating) in about 1 week,   with symptoms typically improving over 12 weeks.  Diarrhea may occur in the first 2 weeks -keep taking it.  The diarrhea should go away and you should start having normal, complete, full bowel movements. It may be helpful to start treatment when you can be near the comfort of your own bathroom, such as a weekend.    Let me know how it works for you, so I can send in the prescription!  We will see you in follow-up after the colonoscopy/endoscopy!   I enjoyed seeing you again today! I value our relationship and want to provide genuine, compassionate, and quality care. You may receive a survey regarding your visit with me, and I welcome your feedback! Thanks so much for taking the time to complete this. I look forward to seeing you again.      Delman Ferns, PhD, ANP-BC [facility]

## 2024-02-12 ENCOUNTER — Encounter (INDEPENDENT_AMBULATORY_CARE_PROVIDER_SITE_OTHER): Payer: Self-pay

## 2024-02-22 NOTE — Patient Instructions (Signed)
 Rhonda Patton  02/22/2024     @PREFPERIOPPHARMACY @   Your procedure is scheduled on  02/25/2024.   Report to [facility] at  1115  A.M.   Call this number if you have problems the morning of surgery:  [phone]  If you experience any cold or flu symptoms such as cough, fever, chills, shortness of breath, etc. between now and your scheduled surgery, please notify us  at the above number.   Remember:  Follow the diet and prep instructions given to you by the office.   You may drink clear liquids until 0915 am on 02/25/2024.    Clear liquids allowed are:                    Water , Juice (No red color; non-citric and without pulp; diabetics please choose diet or no sugar options), Carbonated beverages (diabetics please choose diet or no sugar options), Clear Tea (No creamer, milk, or cream, including half & half and powdered creamer), Black Coffee Only (No creamer, milk or cream, including half & half and powdered creamer), and Clear Sports drink (No red color; diabetics please choose diet or no sugar options)    Take these medicines the morning of surgery with A SIP OF WATER                      dilaudid  or tramadol  (if needed), pantoprazole .     Do not wear jewelry, make-up or nail polish, including gel polish,  artificial nails, or any other type of covering on natural nails (fingers and  toes).  Do not wear lotions, powders, or perfumes, or deodorant.  Do not shave 48 hours prior to surgery.  Men may shave face and neck.  Do not bring valuables to the hospital.  [facility] is not responsible for any belongings or valuables.  Contacts, dentures or bridgework may not be worn into surgery.  Leave your suitcase in the car.  After surgery it may be brought to your room.  For patients admitted to the hospital, discharge time will be determined by your treatment team.  Patients discharged the day of surgery will not be allowed to drive home and must have someone  with them for 24 hours.    Special instructions:   DO NOT smoke tobacco or vape for 24 hours before your procedure.  Please read over the following fact sheets that you were given. Anesthesia Post-op Instructions and Care and Recovery After Surgery      Upper Endoscopy, Adult, Care After After the procedure, it is common to have a sore throat. It is also common to have: Mild stomach pain or discomfort. Bloating. Nausea. Follow these instructions at home: The instructions below may help you care for yourself at home. Your health care provider may give you more instructions. If you have questions, ask your health care provider. If you were given a sedative during the procedure, it can affect you for several hours. Do not drive or operate machinery until your health care provider says that it is safe. If you will be going home right after the procedure, plan to have a responsible adult: Take you home from the hospital or clinic. You will not be allowed to drive. Care for you for the time you are told. Follow instructions from your health care provider about what you may eat and drink. Return to your normal activities as told by your health  care provider. Ask your health care provider what activities are safe for you. Take over-the-counter and prescription medicines only as told by your health care provider. Contact a health care provider if you: Have a sore throat that lasts longer than one day. Have trouble swallowing. Have a fever. Get help right away if you: Vomit blood or your vomit looks like coffee grounds. Have bloody, black, or tarry stools. Have a very bad sore throat or you cannot swallow. Have difficulty breathing or very bad pain in your chest or abdomen. These symptoms may be an emergency. Get help right away. Call 911. Do not wait to see if the symptoms will go away. Do not drive yourself to the hospital. Summary After the procedure, it is common to have a sore throat,  mild stomach discomfort, bloating, and nausea. If you were given a sedative during the procedure, it can affect you for several hours. Do not drive until your health care provider says that it is safe. Follow instructions from your health care provider about what you may eat and drink. Return to your normal activities as told by your health care provider. This information is not intended to replace advice given to you by your health care provider. Make sure you discuss any questions you have with your health care provider. Document Revised: 01/15/2022 Document Reviewed: 01/15/2022 Elsevier Patient Education  2024 [employer]Esophageal Dilatation Esophageal dilatation, or dilation, is done to stretch a blocked or narrowed part of your esophagus. The esophagus is the part of your body that moves food from your mouth to your stomach. You may need to have it stretched if: You have a lot of scar tissue and it makes it hard or painful to swallow. You have cancer of the esophagus. There's a problem with how food moves through your esophagus. In some cases, you may need to have this procedure done more than once. Tell a health care provider about: Any allergies you have. All medicines you're taking, including vitamins, herbs, eye drops, creams, and over-the-counter medicines. Any problems you or family members have had with anesthesia. Any bleeding problems you have. Any surgeries you've had. Any medical conditions you have. Whether you're pregnant or may be pregnant. What are the risks? Your health care provider will talk with you about risks. These may include: Bleeding. A hole or tear in your esophagus. What happens before the procedure? When to stop eating and drinking Follow instructions from your provider about what you may eat and drink. These may include: 8 hours before your procedure Stop eating most foods. Do not eat meat, fried foods, or fatty foods. Eat only light foods, such as  toast or crackers. All liquids are okay except energy drinks and alcohol. 6 hours before your procedure Stop eating. Drink only clear liquids, such as water , clear fruit juice, black coffee, plain tea, and sports drinks. Do not drink energy drinks or alcohol. 2 hours before your procedure Stop drinking all liquids. You may be allowed to take medicines with small sips of water . If you don't follow your provider's instructions, your procedure may be delayed or canceled. Medicines Ask your provider about: Changing or stopping your regular medicines. These include any diabetes medicines or blood thinners you take. Taking medicines such as aspirin and ibuprofen . These medicines can thin your blood. Do not take them unless your provider tells you to. Taking over-the-counter medicines, vitamins, herbs, and supplements. General instructions If you'll be going home right after the procedure, plan to have a  responsible adult: Take you home from the hospital or clinic. You won't be allowed to drive. Care for you for the time you're told. What happens during the procedure? You may be given: A sedative. This helps you relax. Anesthesia. This keeps you from feeling pain. It will numb certain areas of your body. The stretching may be done with: Simple dilators. These are tools put in your esophagus to stretch it. Guide wires. These wires are put in using a tube called an endoscope. A dilator is put over the wires to stretch your esophagus. Then the wires are taken out. A balloon. The balloon is on the end of a tube. It's inflated to stretch your esophagus. The procedure may vary among providers and hospitals. What can I expect after the procedure? Your blood pressure, heart rate, breathing rate, and blood oxygen level will be monitored until you leave the hospital or clinic. Your throat may feel sore and numb. This will get better over time. You won't be allowed to eat or drink until your throat is  no longer numb. You may be able to go home when you can: Drink. Pee. Sit on the edge of the bed without nausea or dizziness. Follow these instructions at home: Activity If you were given a sedative during the procedure, it can affect you for several hours. Do not drive or operate machinery until your provider says it's safe. Return to your normal activities as told by your provider. Ask your provider what activities are safe for you. General instructions Take over-the-counter and prescription medicines only as told by your provider. Follow instructions from your provider about what you may eat and drink. Do not use any products that contain nicotine or tobacco. These products include cigarettes, chewing tobacco, and vaping devices, such as e-cigarettes. If you need help quitting, ask your provider. Keep all follow-up visits. Your provider will make sure the procedure worked. Where to find more information American Society for Gastrointestinal Endoscopy (ASGE): asge.org Contact a health care provider if: You have trouble swallowing. You have a fever. Your pain doesn't get better with medicine. Get help right away if: You have chest pain. You have trouble breathing. You vomit blood. Your poop is: Black. Tarry. Bloody. These symptoms may be an emergency. Get help right away. Call 911. Do not wait to see if the symptoms will go away. Do not drive yourself to the hospital. This information is not intended to replace advice given to you by your health care provider. Make sure you discuss any questions you have with your health care provider. Document Revised: 01/02/2023 Document Reviewed: 01/02/2023 Elsevier Patient Education  2024 Else[employer].Colonoscopy, Adult, Care After The following information offers guidance on how to care for yourself after your procedure. Your health care provider may also give you more specific instructions. If you have problems or questions, contact your  health care provider. What can I expect after the procedure? After the procedure, it is common to have: A small amount of blood in your stool for 24 hours after the procedure. Some gas. Mild cramping or bloating of your abdomen. Follow these instructions at home: Eating and drinking  Drink enough fluid to keep your urine pale yellow. Follow instructions from your health care provider about eating or drinking restrictions. Resume your normal diet as told by your health care provider. Avoid heavy or fried foods that are hard to digest. Activity Rest as told by your health care provider. Avoid sitting for a long time without moving.  Get up to take short walks every 1-2 hours. This is important to improve blood flow and breathing. Ask for help if you feel weak or unsteady. Return to your normal activities as told by your health care provider. Ask your health care provider what activities are safe for you. Managing cramping and bloating  Try walking around when you have cramps or feel bloated. If directed, apply heat to your abdomen as told by your health care provider. Use the heat source that your health care provider recommends, such as a moist heat pack or a heating pad. Place a towel between your skin and the heat source. Leave the heat on for 20-30 minutes. Remove the heat if your skin turns bright red. This is especially important if you are unable to feel pain, heat, or cold. You have a greater risk of getting burned. General instructions If you were given a sedative during the procedure, it can affect you for several hours. Do not drive or operate machinery until your health care provider says that it is safe. For the first 24 hours after the procedure: Do not sign important documents. Do not drink alcohol. Do your regular daily activities at a slower pace than normal. Eat soft foods that are easy to digest. Take over-the-counter and prescription medicines only as told by your  health care provider. Keep all follow-up visits. This is important. Contact a health care provider if: You have blood in your stool 2-3 days after the procedure. Get help right away if: You have more than a small spotting of blood in your stool. You have large blood clots in your stool. You have swelling of your abdomen. You have nausea or vomiting. You have a fever. You have increasing pain in your abdomen that is not relieved with medicine. These symptoms may be an emergency. Get help right away. Call 911. Do not wait to see if the symptoms will go away. Do not drive yourself to the hospital. Summary After the procedure, it is common to have a small amount of blood in your stool. You may also have mild cramping and bloating of your abdomen. If you were given a sedative during the procedure, it can affect you for several hours. Do not drive or operate machinery until your health care provider says that it is safe. Get help right away if you have a lot of blood in your stool, nausea or vomiting, a fever, or increased pain in your abdomen. This information is not intended to replace advice given to you by your health care provider. Make sure you discuss any questions you have with your health care provider. Document Revised: 11/18/2022 Document Reviewed: 05/29/2021 Elsevier Patient Education  2024 Elsevier Inc.General Anesthesia, Adult, Care After The following information offers guidance on how to care for yourself after your procedure. Your health care provider may also give you more specific instructions. If you have problems or questions, contact your health care provider. What can I expect after the procedure? After the procedure, it is common for people to: Have pain or discomfort at the IV site. Have nausea or vomiting. Have a sore throat or hoarseness. Have trouble concentrating. Feel cold or chills. Feel weak, sleepy, or tired (fatigue). Have soreness and body aches. These can  affect parts of the body that were not involved in surgery. Follow these instructions at home: For the time period you were told by your health care provider:  Rest. Do not participate in activities where you could fall or  become injured. Do not drive or use machinery. Do not drink alcohol. Do not take sleeping pills or medicines that cause drowsiness. Do not make important decisions or sign legal documents. Do not take care of children on your own. General instructions Drink enough fluid to keep your urine pale yellow. If you have sleep apnea, surgery and certain medicines can increase your risk for breathing problems. Follow instructions from your health care provider about wearing your sleep device: Anytime you are sleeping, including during daytime naps. While taking prescription pain medicines, sleeping medicines, or medicines that make you drowsy. Return to your normal activities as told by your health care provider. Ask your health care provider what activities are safe for you. Take over-the-counter and prescription medicines only as told by your health care provider. Do not use any products that contain nicotine or tobacco. These products include cigarettes, chewing tobacco, and vaping devices, such as e-cigarettes. These can delay incision healing after surgery. If you need help quitting, ask your health care provider. Contact a health care provider if: You have nausea or vomiting that does not get better with medicine. You vomit every time you eat or drink. You have pain that does not get better with medicine. You cannot urinate or have bloody urine. You develop a skin rash. You have a fever. Get help right away if: You have trouble breathing. You have chest pain. You vomit blood. These symptoms may be an emergency. Get help right away. Call 911. Do not wait to see if the symptoms will go away. Do not drive yourself to the hospital. Summary After the procedure, it is  common to have a sore throat, hoarseness, nausea, vomiting, or to feel weak, sleepy, or fatigue. For the time period you were told by your health care provider, do not drive or use machinery. Get help right away if you have difficulty breathing, have chest pain, or vomit blood. These symptoms may be an emergency. This information is not intended to replace advice given to you by your health care provider. Make sure you discuss any questions you have with your health care provider. Document Revised: 01/03/2022 Document Reviewed: 01/03/2022 Elsevier Patient Education  2024 [employer].

## 2024-02-23 ENCOUNTER — Encounter (HOSPITAL_COMMUNITY)
Admission: RE | Admit: 2024-02-23 | Discharge: 2024-02-23 | Disposition: A | Discharge status: Home / Self Care | Source: Ambulatory Visit | Attending: Internal Medicine | Admitting: Internal Medicine

## 2024-02-23 ENCOUNTER — Other Ambulatory Visit: Payer: Self-pay

## 2024-02-23 VITALS — BP 125/83 | HR 78 | Temp 97.8°F | Resp 18 | Ht 63.0 in | Wt 241.8 lb

## 2024-02-23 DIAGNOSIS — Z860102 Personal history of hyperplastic colon polyps: Secondary | ICD-10-CM | POA: Diagnosis not present

## 2024-02-23 DIAGNOSIS — K5909 Other constipation: Secondary | ICD-10-CM | POA: Diagnosis not present

## 2024-02-23 DIAGNOSIS — E6689 Other obesity not elsewhere classified: Secondary | ICD-10-CM | POA: Diagnosis not present

## 2024-02-23 DIAGNOSIS — R1310 Dysphagia, unspecified: Secondary | ICD-10-CM | POA: Diagnosis not present

## 2024-02-23 DIAGNOSIS — N189 Chronic kidney disease, unspecified: Secondary | ICD-10-CM

## 2024-02-23 DIAGNOSIS — I129 Hypertensive chronic kidney disease with stage 1 through stage 4 chronic kidney disease, or unspecified chronic kidney disease: Secondary | ICD-10-CM | POA: Diagnosis not present

## 2024-02-23 DIAGNOSIS — R0602 Shortness of breath: Secondary | ICD-10-CM | POA: Diagnosis not present

## 2024-02-23 DIAGNOSIS — R519 Headache, unspecified: Secondary | ICD-10-CM | POA: Diagnosis not present

## 2024-02-23 DIAGNOSIS — Z79899 Other long term (current) drug therapy: Secondary | ICD-10-CM | POA: Diagnosis not present

## 2024-02-23 DIAGNOSIS — G8929 Other chronic pain: Secondary | ICD-10-CM | POA: Diagnosis not present

## 2024-02-23 DIAGNOSIS — K219 Gastro-esophageal reflux disease without esophagitis: Secondary | ICD-10-CM | POA: Diagnosis not present

## 2024-02-23 DIAGNOSIS — K2270 Barrett's esophagus without dysplasia: Secondary | ICD-10-CM | POA: Diagnosis not present

## 2024-02-23 DIAGNOSIS — Z1211 Encounter for screening for malignant neoplasm of colon: Secondary | ICD-10-CM | POA: Diagnosis not present

## 2024-02-23 DIAGNOSIS — G4730 Sleep apnea, unspecified: Secondary | ICD-10-CM | POA: Diagnosis not present

## 2024-02-23 DIAGNOSIS — Z6841 Body Mass Index (BMI) 40.0 and over, adult: Secondary | ICD-10-CM | POA: Diagnosis not present

## 2024-02-23 DIAGNOSIS — K648 Other hemorrhoids: Secondary | ICD-10-CM | POA: Diagnosis not present

## 2024-02-23 DIAGNOSIS — K449 Diaphragmatic hernia without obstruction or gangrene: Secondary | ICD-10-CM | POA: Diagnosis not present

## 2024-02-23 DIAGNOSIS — K5730 Diverticulosis of large intestine without perforation or abscess without bleeding: Secondary | ICD-10-CM | POA: Diagnosis not present

## 2024-02-23 DIAGNOSIS — I10 Essential (primary) hypertension: Secondary | ICD-10-CM

## 2024-02-23 DIAGNOSIS — Z800 Family history of malignant neoplasm of digestive organs: Secondary | ICD-10-CM | POA: Diagnosis not present

## 2024-02-23 DIAGNOSIS — E1122 Type 2 diabetes mellitus with diabetic chronic kidney disease: Secondary | ICD-10-CM | POA: Diagnosis not present

## 2024-02-23 DIAGNOSIS — Z01818 Encounter for other preprocedural examination: Secondary | ICD-10-CM | POA: Diagnosis not present

## 2024-02-23 DIAGNOSIS — K222 Esophageal obstruction: Secondary | ICD-10-CM | POA: Diagnosis not present

## 2024-02-23 DIAGNOSIS — K31A19 Gastric intestinal metaplasia without dysplasia, unspecified site: Secondary | ICD-10-CM | POA: Diagnosis not present

## 2024-02-23 LAB — BASIC METABOLIC PANEL WITH GFR
Anion gap: 9 (ref 5–15)
BUN: 12 mg/dL (ref 8–23)
CO2: 25 mmol/L (ref 22–32)
Calcium: 9.4 mg/dL (ref 8.9–10.3)
Chloride: 103 mmol/L (ref 98–111)
Creatinine, Ser: 0.93 mg/dL (ref 0.44–1.00)
GFR, Estimated: >60 mL/min (ref >60)
Glucose, Bld: 110 mg/dL — ABNORMAL HIGH (ref 70–99)
Potassium: 3.5 mmol/L (ref 3.5–5.1)
Sodium: 137 mmol/L (ref 135–145)

## 2024-02-25 ENCOUNTER — Ambulatory Visit (HOSPITAL_COMMUNITY): Admitting: Certified Registered"

## 2024-02-25 ENCOUNTER — Encounter (HOSPITAL_COMMUNITY)
Admission: RE | Disposition: A | Discharge status: Home / Self Care | Payer: Self-pay | Source: Home / Self Care | Attending: Internal Medicine

## 2024-02-25 ENCOUNTER — Ambulatory Visit (HOSPITAL_COMMUNITY)
Admission: RE | Admit: 2024-02-25 | Discharge: 2024-02-25 | Disposition: A | Discharge status: Home / Self Care | Attending: Internal Medicine | Admitting: Internal Medicine

## 2024-02-25 ENCOUNTER — Other Ambulatory Visit: Payer: Self-pay

## 2024-02-25 ENCOUNTER — Encounter (HOSPITAL_COMMUNITY): Payer: Self-pay | Admitting: Internal Medicine

## 2024-02-25 DIAGNOSIS — E1122 Type 2 diabetes mellitus with diabetic chronic kidney disease: Secondary | ICD-10-CM | POA: Insufficient documentation

## 2024-02-25 DIAGNOSIS — R1310 Dysphagia, unspecified: Secondary | ICD-10-CM | POA: Insufficient documentation

## 2024-02-25 DIAGNOSIS — R519 Headache, unspecified: Secondary | ICD-10-CM | POA: Diagnosis not present

## 2024-02-25 DIAGNOSIS — G4730 Sleep apnea, unspecified: Secondary | ICD-10-CM | POA: Diagnosis not present

## 2024-02-25 DIAGNOSIS — Z1211 Encounter for screening for malignant neoplasm of colon: Secondary | ICD-10-CM | POA: Diagnosis not present

## 2024-02-25 DIAGNOSIS — Z800 Family history of malignant neoplasm of digestive organs: Secondary | ICD-10-CM | POA: Insufficient documentation

## 2024-02-25 DIAGNOSIS — I10 Essential (primary) hypertension: Secondary | ICD-10-CM | POA: Insufficient documentation

## 2024-02-25 DIAGNOSIS — K5909 Other constipation: Secondary | ICD-10-CM | POA: Diagnosis not present

## 2024-02-25 DIAGNOSIS — K2289 Other specified disease of esophagus: Secondary | ICD-10-CM | POA: Diagnosis not present

## 2024-02-25 DIAGNOSIS — I129 Hypertensive chronic kidney disease with stage 1 through stage 4 chronic kidney disease, or unspecified chronic kidney disease: Secondary | ICD-10-CM | POA: Insufficient documentation

## 2024-02-25 DIAGNOSIS — K2270 Barrett's esophagus without dysplasia: Secondary | ICD-10-CM | POA: Insufficient documentation

## 2024-02-25 DIAGNOSIS — K219 Gastro-esophageal reflux disease without esophagitis: Secondary | ICD-10-CM | POA: Insufficient documentation

## 2024-02-25 DIAGNOSIS — E6689 Other obesity not elsewhere classified: Secondary | ICD-10-CM | POA: Diagnosis not present

## 2024-02-25 DIAGNOSIS — Z860102 Personal history of hyperplastic colon polyps: Secondary | ICD-10-CM | POA: Diagnosis not present

## 2024-02-25 DIAGNOSIS — E119 Type 2 diabetes mellitus without complications: Secondary | ICD-10-CM | POA: Insufficient documentation

## 2024-02-25 DIAGNOSIS — K648 Other hemorrhoids: Secondary | ICD-10-CM | POA: Diagnosis not present

## 2024-02-25 DIAGNOSIS — K449 Diaphragmatic hernia without obstruction or gangrene: Secondary | ICD-10-CM | POA: Diagnosis not present

## 2024-02-25 DIAGNOSIS — Z79899 Other long term (current) drug therapy: Secondary | ICD-10-CM | POA: Diagnosis not present

## 2024-02-25 DIAGNOSIS — Z01818 Encounter for other preprocedural examination: Secondary | ICD-10-CM | POA: Diagnosis not present

## 2024-02-25 DIAGNOSIS — K5730 Diverticulosis of large intestine without perforation or abscess without bleeding: Secondary | ICD-10-CM | POA: Insufficient documentation

## 2024-02-25 DIAGNOSIS — G8929 Other chronic pain: Secondary | ICD-10-CM | POA: Diagnosis not present

## 2024-02-25 DIAGNOSIS — Z6841 Body Mass Index (BMI) 40.0 and over, adult: Secondary | ICD-10-CM | POA: Insufficient documentation

## 2024-02-25 DIAGNOSIS — K31A19 Gastric intestinal metaplasia without dysplasia, unspecified site: Secondary | ICD-10-CM | POA: Insufficient documentation

## 2024-02-25 DIAGNOSIS — N189 Chronic kidney disease, unspecified: Secondary | ICD-10-CM | POA: Diagnosis not present

## 2024-02-25 DIAGNOSIS — K222 Esophageal obstruction: Secondary | ICD-10-CM | POA: Insufficient documentation

## 2024-02-25 DIAGNOSIS — R0602 Shortness of breath: Secondary | ICD-10-CM | POA: Insufficient documentation

## 2024-02-25 DIAGNOSIS — K2100 Gastro-esophageal reflux disease with esophagitis, without bleeding: Secondary | ICD-10-CM

## 2024-02-25 LAB — GLUCOSE, CAPILLARY
Glucose-Capillary: 101 mg/dL — ABNORMAL HIGH (ref 70–99)
Glucose-Capillary: 95 mg/dL (ref 70–99)

## 2024-02-25 SURGERY — COLONOSCOPY
Anesthesia: General

## 2024-02-25 MED ORDER — LACTATED RINGERS IV SOLN: INTRAVENOUS | Status: DC | PRN

## 2024-02-25 MED ORDER — PROPOFOL 10 MG/ML IV BOLUS
INTRAVENOUS | Status: DC | PRN
  Administered 2024-02-25: 50 mg via INTRAVENOUS
  Administered 2024-02-25: 150 ug/kg/min via INTRAVENOUS
  Administered 2024-02-25: 50 mg via INTRAVENOUS

## 2024-02-25 MED ORDER — LACTATED RINGERS IV SOLN: INTRAVENOUS | Status: DC

## 2024-02-25 MED ORDER — LIDOCAINE 2% (20 MG/ML) 5 ML SYRINGE
INTRAMUSCULAR | Status: DC | PRN
  Administered 2024-02-25: 80 mg via INTRAVENOUS

## 2024-02-25 NOTE — Transfer of Care (Signed)
 Immediate Anesthesia Transfer of Care Note  Patient: Rhonda Patton  Procedure(s) Performed: COLONOSCOPY EGD (ESOPHAGOGASTRODUODENOSCOPY) DILATION, ESOPHAGUS  Patient Location: PACU and Endoscopy Unit  Anesthesia Type:MAC  Level of Consciousness: awake and alert   Airway & Oxygen Therapy: Patient Spontanous Breathing and Patient connected to nasal cannula oxygen  Post-op Assessment: Report given to RN and Post -op Vital signs reviewed and stable  Post vital signs: Reviewed and stable  Last Vitals:  Vitals Value Taken Time  BP 112/50 02/25/24 1315  Temp 36.6 C 02/25/24 1315  Pulse 75 02/25/24 1315  Resp 23 02/25/24 1315  SpO2 99 % 02/25/24 1315    Last Pain:  Vitals:   02/25/24 1315  TempSrc: Oral  PainSc: 0-No pain      Patients Stated Pain Goal: (P) 5 (02/25/24 1141)  Complications: No notable events documented.

## 2024-02-25 NOTE — Discharge Instructions (Addendum)
 EGD Discharge instructions Please read the instructions outlined below and refer to this sheet in the next few weeks. These discharge instructions provide you with general information on caring for yourself after you leave the hospital. Your doctor may also give you specific instructions. While your treatment has been planned according to the most current medical practices available, unavoidable complications occasionally occur. If you have any problems or questions after discharge, please call your doctor. ACTIVITY You may resume your regular activity but move at a slower pace for the next 24 hours.  Take frequent rest periods for the next 24 hours.  Walking will help expel (get rid of) the air and reduce the bloated feeling in your abdomen.  No driving for 24 hours (because of the anesthesia (medicine) used during the test).  You may shower.  Do not sign any important legal documents or operate any machinery for 24 hours (because of the anesthesia used during the test).  NUTRITION Drink plenty of fluids.  You may resume your normal diet.  Begin with a light meal and progress to your normal diet.  Avoid alcoholic beverages for 24 hours or as instructed by your caregiver.  MEDICATIONS You may resume your normal medications unless your caregiver tells you otherwise.  WHAT YOU CAN EXPECT TODAY You may experience abdominal discomfort such as a feeling of fullness or "gas" pains.  FOLLOW-UP Your doctor will discuss the results of your test with you.  SEEK IMMEDIATE MEDICAL ATTENTION IF ANY OF THE FOLLOWING OCCUR: Excessive nausea (feeling sick to your stomach) and/or vomiting.  Severe abdominal pain and distention (swelling).  Trouble swallowing.  Temperature over 101 F (37.8 C).  Rectal bleeding or vomiting of blood.     Colonoscopy Discharge Instructions  Read the instructions outlined below and refer to this sheet in the next few weeks. These discharge instructions provide you with  general information on caring for yourself after you leave the hospital. Your doctor may also give you specific instructions. While your treatment has been planned according to the most current medical practices available, unavoidable complications occasionally occur.   ACTIVITY You may resume your regular activity, but move at a slower pace for the next 24 hours.  Take frequent rest periods for the next 24 hours.  Walking will help get rid of the air and reduce the bloated feeling in your belly (abdomen).  No driving for 24 hours (because of the medicine (anesthesia) used during the test).   Do not sign any important legal documents or operate any machinery for 24 hours (because of the anesthesia used during the test).  NUTRITION Drink plenty of fluids.  You may resume your normal diet as instructed by your doctor.  Begin with a light meal and progress to your normal diet. Heavy or fried foods are harder to digest and may make you feel sick to your stomach (nauseated).  Avoid alcoholic beverages for 24 hours or as instructed.  MEDICATIONS You may resume your normal medications unless your doctor tells you otherwise.  WHAT YOU CAN EXPECT TODAY Some feelings of bloating in the abdomen.  Passage of more gas than usual.  Spotting of blood in your stool or on the toilet paper.  IF YOU HAD POLYPS REMOVED DURING THE COLONOSCOPY: No aspirin products for 7 days or as instructed.  No alcohol for 7 days or as instructed.  Eat a soft diet for the next 24 hours.  FINDING OUT THE RESULTS OF YOUR TEST Not all test results are  available during your visit. If your test results are not back during the visit, make an appointment with your caregiver to find out the results. Do not assume everything is normal if you have not heard from your caregiver or the medical facility. It is important for you to follow up on all of your test results.  SEEK IMMEDIATE MEDICAL ATTENTION IF: You have more than a spotting of  blood in your stool.  Your belly is swollen (abdominal distention).  You are nauseated or vomiting.  You have a temperature over 101.  You have abdominal pain or discomfort that is severe or gets worse throughout the day.   Your EGD revealed stable Barrett's esophagus findings.  I took biopsies today.  Await pathology results, my office will contact you.  You also had a tightening of your esophagus called a Schatzki's ring.  This is a benign ring related to acid reflux.  I stretched this out today.  Hopefully this helps with feeling of food getting stuck.  Continue on pantoprazole .  Your colonoscopy was relatively unremarkable.  I did not find any polyps or evidence of colon cancer.  I recommend repeating colonoscopy in 5 years for colon cancer screening purposes given your family history..  You do have diverticulosis and internal hemorrhoids. I would recommend increasing fiber in your diet or adding OTC Benefiber/Metamucil. Be sure to drink at least 4 to 6 glasses of water  daily.   Follow-up with GI in 2 to 3 months. Office will call with appointment     I hope you have a great rest of your week!  Rolando Cliche. Mordechai April, D.O. Gastroenterology and Hepatology [facility]

## 2024-02-25 NOTE — Anesthesia Preprocedure Evaluation (Signed)
 Anesthesia Evaluation  Patient identified by MRN, date of birth, ID band Patient awake    Reviewed: Allergy & Precautions, H&P , NPO status , Patient's Chart, lab work & pertinent test results, reviewed documented beta blocker date and time   History of Anesthesia Complications (+) PONV and history of anesthetic complications  Airway Mallampati: II  TM Distance: >3 FB Neck ROM: full    Dental no notable dental hx.    Pulmonary neg pulmonary ROS, shortness of breath, sleep apnea    Pulmonary exam normal breath sounds clear to auscultation       Cardiovascular Exercise Tolerance: Good hypertension, negative cardio ROS  Rhythm:regular Rate:Normal     Neuro/Psych  Headaches  Neuromuscular disease negative neurological ROS  negative psych ROS   GI/Hepatic negative GI ROS, Neg liver ROS,GERD  ,,  Endo/Other  diabetes  Class 4 obesity  Renal/GU Renal diseasenegative Renal ROS  negative genitourinary   Musculoskeletal   Abdominal   Peds  Hematology negative hematology ROS (+)   Anesthesia Other Findings   Reproductive/Obstetrics negative OB ROS                             Anesthesia Physical Anesthesia Plan  ASA: 3  Anesthesia Plan: General   Post-op Pain Management:    Induction:   PONV Risk Score and Plan: Propofol  infusion  Airway Management Planned:   Additional Equipment:   Intra-op Plan:   Post-operative Plan:   Informed Consent: I have reviewed the patients History and Physical, chart, labs and discussed the procedure including the risks, benefits and alternatives for the proposed anesthesia with the patient or authorized representative who has indicated his/her understanding and acceptance.     Dental Advisory Given  Plan Discussed with: CRNA  Anesthesia Plan Comments:        Anesthesia Quick Evaluation

## 2024-02-25 NOTE — Interval H&P Note (Signed)
 History and Physical Interval Note:  02/25/2024 12:24 PM  Rhonda Patton  has presented today for surgery, with the diagnosis of FAMILY HISTORY COLON CANCER, HISTORY BARRETTS, DYSPHAGIA.  The various methods of treatment have been discussed with the patient and family. After consideration of risks, benefits and other options for treatment, the patient has consented to  Procedure(s) with comments: COLONOSCOPY (N/A) - 1:30PM;ASA 3 EGD (ESOPHAGOGASTRODUODENOSCOPY) (N/A) - 1:30PM;ASA 3 DILATION, ESOPHAGUS (N/A) - 1:30PM;ASA 3 as a surgical intervention.  The patient's history has been reviewed, patient examined, no change in status, stable for surgery.  I have reviewed the patient's chart and labs.  Questions were answered to the patient's satisfaction.     Vinetta Greening

## 2024-02-25 NOTE — Op Note (Signed)
 [facility] Patient Name: Rhonda Patton Procedure Date: 02/25/2024 12:27 PM MRN: [id number] Date of Birth: Mar 19, 1961 Attending MD: Rolando Cliche. Roel Clarity, [id number] CSN: [id number] Age: 63 Admit Type: Outpatient Procedure:                Colonoscopy Indications:              Screening in patient at increased risk: Colorectal                            cancer in mother before age 76 Providers:                Rolando Cliche. Mordechai April, DO, Tammy Vaught, RN, Annell Barrow Referring MD:              Medicines:                See the Anesthesia note for documentation of the                            administered medications Complications:            No immediate complications. Estimated Blood Loss:     Estimated blood loss: none. Procedure:                Pre-Anesthesia Assessment:                           - The anesthesia plan was to use monitored                            anesthesia care (MAC).                           After obtaining informed consent, the colonoscope                            was passed under direct vision. Throughout the                            procedure, the patient's blood pressure, pulse, and                            oxygen saturations were monitored continuously. The                            [id number]-HQ190L ([id number]) scope was introduced through                            the anus and advanced to the the cecum, identified                            by appendiceal orifice and ileocecal valve. The                            colonoscopy was somewhat difficult due  to a                            redundant colon and significant looping. Successful                            completion of the procedure was aided by                            straightening and shortening the scope to obtain                            bowel loop reduction. The patient tolerated the                            procedure well. The quality of the bowel                             preparation was evaluated using the BBPS [facility]                            Bowel Preparation Scale) with scores of: Right                            Colon = 2 (minor amount of residual staining, small                            fragments of stool and/or opaque liquid, but mucosa                            seen well), Transverse Colon = 2 (minor amount of                            residual staining, small fragments of stool and/or                            opaque liquid, but mucosa seen well) and Left Colon                            = 2 (minor amount of residual staining, small                            fragments of stool and/or opaque liquid, but mucosa                            seen well). The total BBPS score equals 6. The                            quality of the bowel preparation was good. Scope In: 12:52:41 PM Scope Out: 1:09:33 PM Scope Withdrawal Time: 0 hours 12 minutes 3 seconds  Total Procedure Duration: 0 hours 16 minutes 52 seconds  Findings:      Non-bleeding internal hemorrhoids were found during endoscopy.  Multiple medium-mouthed and small-mouthed diverticula were found in the       sigmoid colon.      The exam was otherwise without abnormality. Impression:               - Non-bleeding internal hemorrhoids.                           - Diverticulosis in the sigmoid colon.                           - The examination was otherwise normal.                           - No specimens collected. Moderate Sedation:      Per Anesthesia Care Recommendation:           - Patient has a contact number available for                            emergencies. The signs and symptoms of potential                            delayed complications were discussed with the                            patient. Return to normal activities tomorrow.                            Written discharge instructions were provided to the                            patient.                            - Resume previous diet.                           - Continue present medications.                           - Repeat colonoscopy in 5 years for screening                            purposes.                           - Return to GI clinic in 3 months. Procedure Code(s):        --- Professional ---                           N6295, Colorectal cancer screening; colonoscopy on                            individual at high risk Diagnosis Code(s):        --- Professional ---  Z80.0, Family history of malignant neoplasm of                            digestive organs                           K64.8, Other hemorrhoids                           K57.30, Diverticulosis of large intestine without                            perforation or abscess without bleeding CPT copyright 2022 American Medical Association. All rights reserved. The codes documented in this report are preliminary and upon coder review may  be revised to meet current compliance requirements. Rolando Cliche. Mordechai April, DO Rolando Cliche. Mordechai April, DO 02/25/2024 1:13:32 PM This report has been signed electronically. Number of Addenda: 0

## 2024-02-25 NOTE — Op Note (Signed)
 [facility] Patient Name: Rhonda Patton Procedure Date: 02/25/2024 12:28 PM MRN: [id number] Date of Birth: December 18, 1960 Attending MD: Rolando Cliche. Mordechai April , [place], [id number] CSN: [id number] Age: 63 Admit Type: Outpatient Procedure:                Upper GI endoscopy Indications:              Dysphagia, Barrett's esophagus, Follow-up of                            Barrett's esophagus Providers:                Rolando Cliche. Mordechai April, DO, Tammy Vaught, RN, Annell Barrow Referring MD:              Medicines:                See the Anesthesia note for documentation of the                            administered medications Complications:            No immediate complications. Estimated Blood Loss:     Estimated blood loss was minimal. Procedure:                Pre-Anesthesia Assessment:                           - The anesthesia plan was to use monitored                            anesthesia care (MAC).                           After obtaining informed consent, the endoscope was                            passed under direct vision. Throughout the                            procedure, the patient's blood pressure, pulse, and                            oxygen saturations were monitored continuously. The                            [id number]-[id number] ([id number]) scope was introduced through the                            mouth, and advanced to the second part of duodenum.                            The upper GI endoscopy was accomplished without                            difficulty. The patient tolerated the procedure  well. Scope In: 12:41:26 PM Scope Out: 12:47:44 PM Total Procedure Duration: 0 hours 6 minutes 18 seconds  Findings:      There were esophageal mucosal changes secondary to established       short-segment Barrett's disease present at the gastroesophageal       junction. The maximum longitudinal extent of these mucosal changes was 1        cm in length. Mucosa was biopsied with a cold forceps for histology. One       specimen bottle was sent to pathology.      A mild Schatzki ring was found in the lower third of the esophagus. A       TTS dilator was passed through the scope. Dilation with an 18-19-20 mm       balloon dilator was performed to 20 mm. The dilation site was examined       and showed moderate improvement in luminal narrowing.      A small hiatal hernia was present.      The entire examined stomach was normal.      The duodenal bulb, first portion of the duodenum and second portion of       the duodenum were normal. Impression:               - Esophageal mucosal changes secondary to                            established short-segment Barrett's disease.                            Biopsied.                           - Mild Schatzki ring. Dilated.                           - Small hiatal hernia.                           - Normal stomach.                           - Normal duodenal bulb, first portion of the                            duodenum and second portion of the duodenum. Moderate Sedation:      Per Anesthesia Care Recommendation:           - Patient has a contact number available for                            emergencies. The signs and symptoms of potential                            delayed complications were discussed with the                            patient. Return to normal activities tomorrow.  Written discharge instructions were provided to the                            patient.                           - Resume previous diet.                           - Continue present medications.                           - Await pathology results.                           - Repeat upper endoscopy in 5 years for                            surveillance of Barrett's esophagus.                           - Return to GI clinic in 3 months. Procedure Code(s):        ---  Professional ---                           [phone], Esophagogastroduodenoscopy, flexible,                            transoral; with transendoscopic balloon dilation of                            esophagus (less than 30 mm diameter)                           43239, 59, Esophagogastroduodenoscopy, flexible,                            transoral; with biopsy, single or multiple Diagnosis Code(s):        --- Professional ---                           K22.70, Barrett's esophagus without dysplasia                           K22.2, Esophageal obstruction                           K44.9, Diaphragmatic hernia without obstruction or                            gangrene                           R13.10, Dysphagia, unspecified CPT copyright 2022 American Medical Association. All rights reserved. The codes documented in this report are preliminary and upon coder review may  be revised to meet current compliance requirements. Rolando Cliche. Mordechai April, DO Rolando Cliche. Mordechai April, DO 02/25/2024 12:51:24 PM This report has  been signed electronically. Number of Addenda: 0

## 2024-02-26 ENCOUNTER — Encounter (HOSPITAL_COMMUNITY): Payer: Self-pay | Admitting: Internal Medicine

## 2024-02-29 LAB — SURGICAL PATHOLOGY

## 2024-03-02 DIAGNOSIS — K222 Esophageal obstruction: Secondary | ICD-10-CM | POA: Diagnosis not present

## 2024-03-02 DIAGNOSIS — E1165 Type 2 diabetes mellitus with hyperglycemia: Secondary | ICD-10-CM | POA: Diagnosis not present

## 2024-03-02 DIAGNOSIS — K22719 Barrett's esophagus with dysplasia, unspecified: Secondary | ICD-10-CM | POA: Diagnosis not present

## 2024-03-02 DIAGNOSIS — K449 Diaphragmatic hernia without obstruction or gangrene: Secondary | ICD-10-CM | POA: Diagnosis not present

## 2024-03-04 NOTE — Anesthesia Postprocedure Evaluation (Signed)
 Anesthesia Post Note  Patient: Rhonda Patton  Procedure(s) Performed: COLONOSCOPY EGD (ESOPHAGOGASTRODUODENOSCOPY) DILATION, ESOPHAGUS  Patient location during evaluation: Phase II Anesthesia Type: General Level of consciousness: awake Pain management: pain level controlled Vital Signs Assessment: post-procedure vital signs reviewed and stable Respiratory status: spontaneous breathing and respiratory function stable Cardiovascular status: blood pressure returned to baseline and stable Postop Assessment: no headache and no apparent nausea or vomiting Anesthetic complications: no Comments: Late entry   No notable events documented.   Last Vitals:  Vitals:   02/25/24 1315 02/25/24 1324  BP: (!) 112/50 (!) 109/94  Pulse: 75 62  Resp: (!) 23 19  Temp: 36.6 C   SpO2: 99% 100%    Last Pain:  Vitals:   02/26/24 1550  TempSrc:   PainSc: 3                  Coretha Dew

## 2024-03-10 ENCOUNTER — Ambulatory Visit: Payer: Self-pay | Admitting: Internal Medicine

## 2024-03-17 DIAGNOSIS — H524 Presbyopia: Secondary | ICD-10-CM | POA: Diagnosis not present

## 2024-04-12 ENCOUNTER — Encounter: Payer: Self-pay | Admitting: Internal Medicine

## 2024-04-15 ENCOUNTER — Other Ambulatory Visit: Payer: Self-pay

## 2024-04-15 ENCOUNTER — Encounter (HOSPITAL_COMMUNITY): Payer: Self-pay

## 2024-04-15 ENCOUNTER — Emergency Department (HOSPITAL_COMMUNITY)

## 2024-04-15 ENCOUNTER — Emergency Department (HOSPITAL_COMMUNITY)
Admission: EM | Admit: 2024-04-15 | Discharge: 2024-04-15 | Disposition: A | Discharge status: Home / Self Care | Attending: Emergency Medicine | Admitting: Emergency Medicine

## 2024-04-15 DIAGNOSIS — R42 Dizziness and giddiness: Secondary | ICD-10-CM | POA: Diagnosis not present

## 2024-04-15 DIAGNOSIS — R531 Weakness: Secondary | ICD-10-CM | POA: Diagnosis not present

## 2024-04-15 DIAGNOSIS — R935 Abnormal findings on diagnostic imaging of other abdominal regions, including retroperitoneum: Secondary | ICD-10-CM | POA: Diagnosis not present

## 2024-04-15 DIAGNOSIS — R079 Chest pain, unspecified: Secondary | ICD-10-CM | POA: Diagnosis not present

## 2024-04-15 DIAGNOSIS — D71 Functional disorders of polymorphonuclear neutrophils: Secondary | ICD-10-CM | POA: Diagnosis not present

## 2024-04-15 LAB — CBC
HCT: 42.3 % (ref 36.0–46.0)
Hemoglobin: 14 g/dL (ref 12.0–15.0)
MCH: 29 pg (ref 26.0–34.0)
MCHC: 33.1 g/dL (ref 30.0–36.0)
MCV: 87.6 fL (ref 80.0–100.0)
Platelets: 213 10*3/uL (ref 150–400)
RBC: 4.83 MIL/uL (ref 3.87–5.11)
RDW: 13.7 % (ref 11.5–15.5)
WBC: 4.5 10*3/uL (ref 4.0–10.5)
nRBC: 0 % (ref 0.0–0.2)

## 2024-04-15 LAB — BASIC METABOLIC PANEL WITH GFR
Anion gap: 10 (ref 5–15)
BUN: 10 mg/dL (ref 8–23)
CO2: 26 mmol/L (ref 22–32)
Calcium: 9.5 mg/dL (ref 8.9–10.3)
Chloride: 103 mmol/L (ref 98–111)
Creatinine, Ser: 0.98 mg/dL (ref 0.44–1.00)
GFR, Estimated: >60 mL/min (ref >60)
Glucose, Bld: 86 mg/dL (ref 70–99)
Potassium: 3.9 mmol/L (ref 3.5–5.1)
Sodium: 139 mmol/L (ref 135–145)

## 2024-04-15 LAB — D-DIMER, QUANTITATIVE: D-Dimer, Quant: 1.49 ug{FEU}/mL — ABNORMAL HIGH (ref 0.00–0.50)

## 2024-04-15 LAB — TROPONIN I (HIGH SENSITIVITY)
Troponin I (High Sensitivity): 5 ng/L (ref <18)
Troponin I (High Sensitivity): 5 ng/L (ref <18)

## 2024-04-15 LAB — BRAIN NATRIURETIC PEPTIDE: B Natriuretic Peptide: 12 pg/mL (ref 0.0–100.0)

## 2024-04-15 MED ORDER — LACTATED RINGERS IV BOLUS
1000.0000 mL | Freq: Once | INTRAVENOUS | Status: AC
  Administered 2024-04-15: 1000 mL via INTRAVENOUS

## 2024-04-15 MED ORDER — IOHEXOL 350 MG/ML SOLN
75.0000 mL | Freq: Once | INTRAVENOUS | Status: AC | PRN
  Administered 2024-04-15: 75 mL via INTRAVENOUS

## 2024-04-15 NOTE — Discharge Instructions (Signed)
 It is unclear why you are feeling weak.  Your lab work, chest x-ray and CT scan are all reassuring.  Follow-up with your primary care physician.  However if you develop fever, new or worsening symptoms, chest pain, or any other new/concerning symptoms then return to the ER.

## 2024-04-15 NOTE — ED Triage Notes (Signed)
 Pt from home complains of weakness and dizziness for about a week states that it been getting worse. Endorse SOB and episode of CP ,Nausea but denies vomiting. Pt AAOx4, Ambulatory.

## 2024-04-15 NOTE — ED Provider Notes (Signed)
 [place] EMERGENCY DEPARTMENT AT [facility] Provider Note   CSN: [id number] Arrival date & time: 04/15/24  1451     Patient presents with: Weakness, Dizziness, and Chest Pain   [place] KAMMY KLETT is a 63 y.o. female.   HPI 63 year old female presents with shortness of breath and dizziness.  Symptoms have been ongoing for about a week but she woke up this morning and it was a lot worse.  Shortness of breath is getting to the point where it is all the time rather than just exertion.  She also has some on and off chest tightness and on and off headaches.  The lightheadedness is whenever she does anything or turns her head to the side.  It feels like things are getting dark and she is going to pass out rather than a room spinning sensation or gait disturbance.  She feels like she has to walk slowly however so that she does not fall or pass out.  She feels like her right ankle has been swollen for the last couple days, this is the same leg that she had her knee replacement on about 7 months ago.  She also has had some mild diarrhea without blood for the last few days.  Some nausea but no vomiting. No new meds.  Prior to Admission medications   Medication Sig Start Date End Date Taking? Authorizing Provider  acetaminophen  (TYLENOL ) 500 MG tablet Take 500-1,000 mg by mouth every 6 (six) hours as needed for moderate pain (pain score 4-6).    [provider]  atorvastatin  (LIPITOR) 40 MG tablet Take 40 mg by mouth at bedtime.    [provider]  Cholecalciferol (VITAMIN D-3 PO) Take 1 tablet by mouth every evening.    [provider]  famotidine  (PEPCID ) 40 MG tablet Take 40 mg by mouth at bedtime. 06/10/20   [provider]  hydrochlorothiazide  (HYDRODIURIL ) 25 MG tablet Take 25 mg by mouth in the morning. 08/21/17   [provider]  HYDROmorphone  (DILAUDID ) 2 MG tablet Take 1-2 tablets (2-4 mg total) by mouth every 6 (six) hours as needed for  severe pain (pain score 7-10). 09/16/23   Kristian Stabs, PA  hydroquinone 4 % cream Apply 1 application  topically at bedtime. 07/14/23   [provider]  levocetirizine (XYZAL) 5 MG tablet Take 5 mg by mouth daily as needed for allergies. 01/14/22   [provider]  pantoprazole  (PROTONIX ) 40 MG tablet Take 1 tablet (40 mg total) by mouth 2 (two) times daily before a meal. 02/11/24 03/12/24  Shirlean Therisa ORN, NP  traMADol  (ULTRAM ) 50 MG tablet Take 1-2 tablets (50-100 mg total) by mouth every 6 (six) hours as needed for moderate pain (pain score 4-6). 09/16/23   Kristian Stabs, PA    Allergies: Codeine and Aspirin    Review of Systems  Constitutional:  Negative for fever.  Eyes:  Negative for visual disturbance.  Respiratory:  Positive for shortness of breath.   Cardiovascular:  Positive for chest pain and leg swelling (right ankle).  Gastrointestinal:  Positive for diarrhea and nausea. Negative for abdominal pain, blood in stool and vomiting.  Neurological:  Positive for weakness, light-headedness and headaches. Negative for numbness.    Updated Vital Signs BP (!) 147/88   Pulse 70   Temp 97.9 F (36.6 C) (Oral)   Resp 20   SpO2 96%   Physical Exam Vitals and nursing note reviewed.  Constitutional:      General: She  is not in acute distress.    Appearance: She is well-developed. She is obese. She is not ill-appearing or diaphoretic.  HENT:     Head: Normocephalic and atraumatic.   Eyes:     Extraocular Movements: Extraocular movements intact.     Pupils: Pupils are equal, round, and reactive to light.    Cardiovascular:     Rate and Rhythm: Normal rate and regular rhythm.     Heart sounds: Normal heart sounds.  Pulmonary:     Effort: Pulmonary effort is normal.     Breath sounds: Normal breath sounds. No wheezing.  Abdominal:     Palpations: Abdomen is soft.     Tenderness: There is no abdominal tenderness.   Musculoskeletal:     Cervical back: No  rigidity.     Right lower leg: No edema.     Left lower leg: No edema.   Skin:    General: Skin is warm and dry.   Neurological:     Mental Status: She is alert.     Comments: CN 3-12 grossly intact. 5/5 strength in all 4 extremities. Grossly normal sensation. Normal finger to nose. Normal but slow gait, no ataxia.    (all labs ordered are listed, but only abnormal results are displayed) Labs Reviewed  D-DIMER, QUANTITATIVE - Abnormal; Notable for the following components:      Result Value   D-Dimer, Quant 1.49 (*)    All other components within normal limits  BASIC METABOLIC PANEL WITH GFR  CBC  BRAIN NATRIURETIC PEPTIDE  TROPONIN I (HIGH SENSITIVITY)  TROPONIN I (HIGH SENSITIVITY)    EKG: EKG Interpretation Date/Time:  Friday April 15 2024 15:06:38 EDT Ventricular Rate:  86 PR Interval:  134 QRS Duration:  74 QT Interval:  370 QTC Calculation: 442 R Axis:   21  Text Interpretation: Normal sinus rhythm Low voltage QRS ST/T changes similar to Jan 2024 Confirmed by Freddi Hamilton [phone]) on 04/15/2024 3:10:26 PM  Radiology: CT Angio Chest PE W and/or Wo Contrast Result Date: 04/15/2024 CLINICAL DATA:  Pulmonary embolism (PE) suspected, low to intermediate prob, positive D-dimer Weakness and dizziness.  Shortness of breath. EXAM: CT ANGIOGRAPHY CHEST WITH CONTRAST TECHNIQUE: Multidetector CT imaging of the chest was performed using the standard protocol during bolus administration of intravenous contrast. Multiplanar CT image reconstructions and MIPs were obtained to evaluate the vascular anatomy. RADIATION DOSE REDUCTION: This exam was performed according to the departmental dose-optimization program which includes automated exposure control, adjustment of the mA and/or kV according to patient size and/or use of iterative reconstruction technique. CONTRAST:  75mL OMNIPAQUE  IOHEXOL  350 MG/ML SOLN COMPARISON:  Radiograph earlier today. Report from chest CT 07/15/2017, images  unavailable FINDINGS: Cardiovascular: There are no filling defects within the pulmonary arteries to suggest pulmonary embolus. The heart is normal in size. The thoracic aorta is normal in caliber without acute aortic findings. No pericardial effusion. Mediastinum/Nodes: Calcified mediastinal and left hilar lymph nodes consistent with prior granulomatous disease. No noncalcified adenopathy. Small hiatal hernia. Lungs/Pleura: Clear lungs. No focal airspace disease. No features of pulmonary edema. No pleural effusion. No suspicious pulmonary nodule. Upper Abdomen: Calcified granuloma in the spleen. No acute upper abdominal findings. Musculoskeletal: There are no acute or suspicious osseous abnormalities. Thoracic spondylosis. Review of the MIP images confirms the above findings. IMPRESSION: 1. No pulmonary embolus or acute intrathoracic abnormality. 2. Sequela of prior granulomatous disease. 3. Small hiatal hernia. Electronically Signed   By: Andrea Gasman M.D.   On: 04/15/2024  19:34   DG Chest 2 View Result Date: 04/15/2024 CLINICAL DATA:  Weakness and chest pain EXAM: CHEST - 2 VIEW COMPARISON:  X-ray 04/18/2021 FINDINGS: Underinflation. No consolidation, pneumothorax or effusion. No edema. Normal cardiopericardial silhouette. Degenerative changes along the spine. Surgical clips in the upper abdomen. IMPRESSION: Underinflation.  No acute cardiopulmonary disease. Electronically Signed   By: Ranell Bring M.D.   On: 04/15/2024 15:41     Procedures   Medications Ordered in the ED  lactated ringers  bolus 1,000 mL (0 mLs Intravenous Stopped 04/15/24 2030)  iohexol  (OMNIPAQUE ) 350 MG/ML injection 75 mL (75 mLs Intravenous Contrast Given 04/15/24 1857)                                    Medical Decision Making Amount and/or Complexity of Data Reviewed Labs: ordered.    Details: Normal troponins x 2.  Normal hemoglobin Radiology: ordered and independent interpretation performed.    Details: No  PE ECG/medicine tests: ordered and independent interpretation performed.    Details: No acute ischemia  Risk Prescription drug management.   Patient presents with generalized weakness and lightheadedness as well as some dyspnea.  Has been ongoing for about a week.  Neuroexam is unremarkable including gait.  Workup included troponins, EKG, and CTA of the chest which were all unremarkable.  No obvious findings for CHF.  Unclear why she is having the symptoms but with an unremarkable neuroexam I do not think CNS imaging is needed.  Will have her follow-up with her PCP.  Given return precautions.  Highly doubt ACS, dissection, etc.     Final diagnoses:  Generalized weakness    ED Discharge Orders     None          Freddi Hamilton, MD 04/15/24 2034

## 2024-04-15 NOTE — ED Notes (Signed)
 Patient transported to CT

## 2024-04-21 DIAGNOSIS — Z5189 Encounter for other specified aftercare: Secondary | ICD-10-CM | POA: Diagnosis not present

## 2024-04-26 ENCOUNTER — Other Ambulatory Visit (HOSPITAL_COMMUNITY): Payer: Self-pay | Admitting: Family Medicine

## 2024-04-26 DIAGNOSIS — R0609 Other forms of dyspnea: Secondary | ICD-10-CM | POA: Diagnosis not present

## 2024-04-26 DIAGNOSIS — R0789 Other chest pain: Secondary | ICD-10-CM | POA: Diagnosis not present

## 2024-04-26 DIAGNOSIS — R197 Diarrhea, unspecified: Secondary | ICD-10-CM | POA: Diagnosis not present

## 2024-04-26 DIAGNOSIS — R0600 Dyspnea, unspecified: Secondary | ICD-10-CM | POA: Diagnosis not present

## 2024-04-26 DIAGNOSIS — R42 Dizziness and giddiness: Secondary | ICD-10-CM | POA: Diagnosis not present

## 2024-05-12 ENCOUNTER — Ambulatory Visit: Attending: Cardiology

## 2024-05-12 DIAGNOSIS — R0600 Dyspnea, unspecified: Secondary | ICD-10-CM

## 2024-05-16 LAB — ECHOCARDIOGRAM COMPLETE
AR max vel: 2.49 cm2
AV Area VTI: 2.71 cm2
AV Area mean vel: 2.68 cm2
AV Mean grad: 5 mmHg
AV Peak grad: 11.6 mmHg
AV Vena cont: 0.6 cm
Ao pk vel: 1.7 m/s
Area-P 1/2: 3.72 cm2
Calc EF: 65.3 %
MV VTI: 3.88 cm2
P 1/2 time: 614 ms
S' Lateral: 2.7 cm
Single Plane A2C EF: 57.4 %
Single Plane A4C EF: 69.4 %

## 2024-06-14 DIAGNOSIS — E669 Obesity, unspecified: Secondary | ICD-10-CM | POA: Diagnosis not present

## 2024-06-14 DIAGNOSIS — J984 Other disorders of lung: Secondary | ICD-10-CM | POA: Diagnosis not present

## 2024-06-14 DIAGNOSIS — E785 Hyperlipidemia, unspecified: Secondary | ICD-10-CM | POA: Diagnosis not present

## 2024-06-14 DIAGNOSIS — Z7984 Long term (current) use of oral hypoglycemic drugs: Secondary | ICD-10-CM | POA: Diagnosis not present

## 2024-06-14 DIAGNOSIS — Z20822 Contact with and (suspected) exposure to covid-19: Secondary | ICD-10-CM | POA: Diagnosis not present

## 2024-06-14 DIAGNOSIS — J45901 Unspecified asthma with (acute) exacerbation: Secondary | ICD-10-CM | POA: Diagnosis not present

## 2024-06-14 DIAGNOSIS — Z8719 Personal history of other diseases of the digestive system: Secondary | ICD-10-CM | POA: Diagnosis not present

## 2024-06-14 DIAGNOSIS — R918 Other nonspecific abnormal finding of lung field: Secondary | ICD-10-CM | POA: Diagnosis not present

## 2024-06-14 DIAGNOSIS — Z862 Personal history of diseases of the blood and blood-forming organs and certain disorders involving the immune mechanism: Secondary | ICD-10-CM | POA: Diagnosis not present

## 2024-06-14 DIAGNOSIS — J209 Acute bronchitis, unspecified: Secondary | ICD-10-CM | POA: Diagnosis not present

## 2024-06-14 DIAGNOSIS — Z604 Social exclusion and rejection: Secondary | ICD-10-CM | POA: Diagnosis not present

## 2024-06-14 DIAGNOSIS — R0602 Shortness of breath: Secondary | ICD-10-CM | POA: Diagnosis not present

## 2024-06-14 DIAGNOSIS — R030 Elevated blood-pressure reading, without diagnosis of hypertension: Secondary | ICD-10-CM | POA: Diagnosis not present

## 2024-06-14 DIAGNOSIS — J189 Pneumonia, unspecified organism: Secondary | ICD-10-CM | POA: Diagnosis not present

## 2024-06-14 DIAGNOSIS — Z885 Allergy status to narcotic agent status: Secondary | ICD-10-CM | POA: Diagnosis not present

## 2024-06-14 DIAGNOSIS — Z7951 Long term (current) use of inhaled steroids: Secondary | ICD-10-CM | POA: Diagnosis not present

## 2024-06-14 DIAGNOSIS — R0682 Tachypnea, not elsewhere classified: Secondary | ICD-10-CM | POA: Diagnosis not present

## 2024-06-14 DIAGNOSIS — I10 Essential (primary) hypertension: Secondary | ICD-10-CM | POA: Diagnosis not present

## 2024-06-14 DIAGNOSIS — K219 Gastro-esophageal reflux disease without esophagitis: Secondary | ICD-10-CM | POA: Diagnosis not present

## 2024-06-14 DIAGNOSIS — R0989 Other specified symptoms and signs involving the circulatory and respiratory systems: Secondary | ICD-10-CM | POA: Diagnosis not present

## 2024-06-14 DIAGNOSIS — R051 Acute cough: Secondary | ICD-10-CM | POA: Diagnosis not present

## 2024-06-14 DIAGNOSIS — R000 Tachycardia, unspecified: Secondary | ICD-10-CM | POA: Diagnosis not present

## 2024-06-14 DIAGNOSIS — Z886 Allergy status to analgesic agent status: Secondary | ICD-10-CM | POA: Diagnosis not present

## 2024-06-14 DIAGNOSIS — E119 Type 2 diabetes mellitus without complications: Secondary | ICD-10-CM | POA: Diagnosis not present

## 2024-06-14 DIAGNOSIS — Z1152 Encounter for screening for COVID-19: Secondary | ICD-10-CM | POA: Diagnosis not present

## 2024-06-14 DIAGNOSIS — R0902 Hypoxemia: Secondary | ICD-10-CM | POA: Diagnosis not present

## 2024-06-14 DIAGNOSIS — Z8739 Personal history of other diseases of the musculoskeletal system and connective tissue: Secondary | ICD-10-CM | POA: Diagnosis not present

## 2024-06-14 DIAGNOSIS — Z6841 Body Mass Index (BMI) 40.0 and over, adult: Secondary | ICD-10-CM | POA: Diagnosis not present

## 2024-06-14 DIAGNOSIS — Z79899 Other long term (current) drug therapy: Secondary | ICD-10-CM | POA: Diagnosis not present

## 2024-06-17 NOTE — Nursing Note (Signed)
 Patient discharged to home. States understanding of discharge instructions as well as education. IV removed. To main entrance via wheelchair and left in private vehicle.

## 2024-06-17 NOTE — Discharge Summary (Signed)
 ------------------------------------------------------------------------------- Attestation signed by Feliz Margart Fallow, DO at 06/19/24 [phone] I was the supervising physician during time of service.  Case was discussed with APP and I agree with management as outlined below. -------------------------------------------------------------------------------   DISCHARGE SUMMARY [facility]   Discharge date:   June 17, 2024 Length of stay:    LOS: 1 day    Discharge Service:   [facility] Hospitalists Discharge Attending Physician: Brutus Franco Shank, FNP Discharge to:    To Home Condition at Discharge:  stable Code status:                         Full Code   ______________________________________   Admission HPI    Patient admitted on: 06/14/2024  1:00 PM  Patient admitted by: Margart Fallow Feliz, DO    CHIEF COMPLAINT: Shortness of breath x 3 days with no relief with albuterol inhaler   Day of admission HPI:  Rhonda Patton  is a 63 y.o. female with a PMH significant for GERD; osteoarthritis; Barrett's esophagus (diagnosed in 2021), aortic valve disease, diabetes, hypertension, hyperlipidemia, obesity BMI 45, asthma who presented with shortness of breath x 3 days with no relief with albuterol inhaler.  She has asthma, has never been a smoker.  Has never seen a pulmonologist, does not have a nebulizer machine at home.  She is short of breath at rest and with any exertion becomes increasingly short of breath with increased heart rate.   PCP is Dr. Christyne Hurst in [place].  Patient was admitted for asthma exacerbation for IV steroids, oxygen if needed, nebulizer treatments.   Patient admitted on Home O2? - no Patient on home anticoagulant? -  no Patient admitted with Chronic home foley catheter? - no Foley catheter placed or replaced by another service prior to admission? - no Central Line Status: NONE   Mental Status on Admission: The patient is Alert and  oriented to PERSON The patient is Alert And oriented to TIME The patient is Alert and oriented to LOCATION   Today; patient is sitting up in bed awake and alert.  She remains short of breath but mostly with exertion.  Isn't sleeping well in the hospital.     Problem List, Assessment & Plan     ASSESSMENT & PLAN (In order of descending acuity)  :   Severe asthma with exacerbation (HHS-HCC) // Hypoxia // Hypoxemia Increased work of breathing; accessory muscle use of the neck and chest initially Initially spoke in short sentences of 3-5 words and has to rest for air No oxygen supplementation necessary at this time Tachypnea with respiratory rate up to 28 Tachycardia with heart rate at up to 113 DuoNeb every 6 hours, nebulizer and neb treatments for home Solu-Medrol  IV, prednisone  to start tomorrow (taper) Exertional oxygen test, did not qualify for home oxygen Breo-Ellipta, albuterol inhaler rx's sent to pharmacy      Bilateral pneumonia, ruled out Received IV Zithromax and ceftriaxone; home with doxycycline Two-view chest x-ray 8/28 shows no evidence of pneumonia Likely COPD exacerbation, continue empiric antibiotics Hycodan for cough, Mucinex    Tachycardia Heart rate of 101-113 with walking yesterday Increases with exertion    Tachypnea Respiratory rate up to 28    Wheezing on inspiration // Wheezing on expiration, resolved Received IV solu medrol , nebs    Diabetes A1c 7.0 Glucose ranging 196-284 in the past 24 hours Received sliding scale insulin  and diabetic diet Home on Glipizide, to restart checking  sugar at home, write down all readings and take to PCP appt   Incidental Findings for ourpatient Follow-Up: No significant incidental findings present    Pt is medically stable and ready for discharge home with rx's, nebulizer, pulmonology referral.  Discussed discharge plan with the pt and with Dr. Feliz attending.  Discharge home.     ADDITIONAL NON-ACUTE FINDINGS,  OBSERVATIONS, FAMILY DISCUSSIONS, ETC. (When present):   General; ill-appearing 63 year old female, obese Cardiovascular; regular rhythm, rate 90s Pulmonary;  coarse, diminished.  Saturating well on room air.  At rest she is mildly short of breath but with any exertion, even minimal, she becomes quickly short of breath Abdomen; soft, nontender, bowel sounds present all 4 quadrants Extremities; moves all extremities spontaneously, no edema Neuro; alert and oriented x 3, no focal weakness   DVT prevention; Lovenox ______________________________________  Timeline of Significant Events: No notes on file   Mental Status On day of Discharge:  The patient is Alert and oriented to PERSON The patient is Alert And oriented to TIME The patient is Alert and oriented to LOCATION  CODE STATUS :                    Full Code   An advanced care planning discussion was  had with patient and/or patient's decisions maker (documented separately).  Patient discharged on Home O2? - no Patient discharged on home anticoagulant? -  no  Foley Catheter status: None Central Line Status: NONE  Time Spent on Discharge I spent greater than 30 minutes counseling and coordinating care for the discharge of this patient. The pt and I discussed the importance of outpatient follow-up as well as concerning signs and symptoms that would require immediate evaluation by a medical professional. The aforementioned conversation participants understand  and did show insight. I did use teachback to ensure understanding. The above participant/s is aware that not following the discussed plan, recommendations, and follow up can lead to severe negative effects on the patient's health, up to and including death.  Discharge Medications     Your Medication List     STOP taking these medications    FARXIGA 10 mg Tab tablet Generic drug: dapagliflozin propanediol   hydroquinone 4 % cream   ondansetron  4 MG  tablet Commonly known as: ZOFRAN        START taking these medications    albuterol 90 mcg/actuation inhaler Commonly known as: PROVENTIL HFA;VENTOLIN HFA Inhale 2 puffs every six (6) hours as needed for wheezing or shortness of breath.   doxycycline 100 MG capsule Commonly known as: VIBRAMYCIN Take 1 capsule (100 mg total) by mouth two (2) times a day for 5 days. Take with food   fluticasone furoate-vilanterol 100-25 mcg/dose inhaler Commonly known as: BREO ELLIPTA Inhale 1 puff daily.   glipiZIDE 5 MG tablet Commonly known as: GLUCOTROL Take 1 tablet (5 mg total) by mouth Two (2) times a day (30 minutes before a meal).   guaiFENesin 600 mg 12 hr tablet Commonly known as: MUCINEX Take 1 tablet (600 mg total) by mouth two (2) times a day for 7 days.   HYDROcodone -homatropine 5-1.5 mg/5 mL (5 mL) syrup Commonly known as: HYCODAN Take 5 mL by mouth every six (6) hours as needed for cough for up to 7 days.   ipratropium-albuterol 0.5-2.5 mg/3 mL nebulizer Commonly known as: DUO-NEB Inhale 3 mL by nebulization every six (6) hours.   predniSONE  20 MG tablet Commonly known as: DELTASONE  Take 2 tablets (40 mg  total) by mouth daily for 3 days, THEN 1 tablet (20 mg total) daily for 3 days, THEN 0.5 tablets (10 mg total) daily for 4 days. Start taking on: June 18, 2024       CONTINUE taking these medications    acetaminophen  500 MG tablet Commonly known as: TYLENOL  Take 1-2 tablets (500-1,000 mg total) by mouth.   famotidine  40 MG tablet Commonly known as: PEPCID  TAKE 1 TABLET BY MOUTH AT BEDTIME FOR gerd   hydroCHLOROthiazide  25 MG tablet Commonly known as: HYDRODIURIL  Take 1 tablet (25 mg total) by mouth daily.   levocetirizine 5 MG tablet Commonly known as: XYZAL Take 1 tablet (5 mg total) by mouth nightly.   lidocaine  5 % patch Commonly known as: LIDODERM  Place 1 patch on the skin every twelve (12) hours as needed. Apply to affected area for 12 hours only  each day (then remove patch)   methocarbamol  500 MG tablet Commonly known as: ROBAXIN  Take 2 tablets (1,000 mg total) by mouth Three (3) times a day as needed.   omeprazole 40 MG capsule Commonly known as: PriLOSEC take one tablet by mouth daily for GERD take one tablet by mouth daily for GERD   pantoprazole  40 MG tablet Commonly known as: Protonix  Take 1 tablet (40 mg total) by mouth daily.   rosuvastatin  40 MG tablet Commonly known as: CRESTOR  Take 1 tablet (40 mg total) by mouth daily.       _____________________________________  Nutrition:                                  Diet Instructions     Discharge diet (specify)     Discharge Nutrition Therapy: Consistent Carb   Consistent Carb Level: Consistent Carb 60/60/60 (4/4/4)                     ___________________________________________  Discharge Instructions   Nutrition:                                  Diet Instructions     Discharge diet (specify)     Discharge Nutrition Therapy: Consistent Carb   Consistent Carb Level: Consistent Carb 60/60/60 (4/4/4)       Activity:                                   Activity Instructions     Activity as tolerated         Appointments:                          Follow Up:                              Follow Up instructions and Outpatient Referrals    Ambulatory Referral to Pulmonology     Reason for referral: Asthma   Requested follow up plan: You would evaluate and manage.   Call MD for:  persistent nausea or vomiting     Call MD for:  severe uncontrolled pain     Call MD for: Temperature > 38.5 Celsius ( > 101.3 Fahrenheit)     Discharge instructions         Allergies  Allergen  Reactions  . Aspirin   . Codeine      Past Medical History[1]  Past Surgical History[2]   Family History[3]   Current Medications[4]  Imaging  XR Chest 2 views Result Date: 06/16/2024 Exam: Chest 2 views  HISTORY: Shortness of breath, congestion  TECHNIQUE: 2 views   COMPARISON: 06/14/2024  FINDINGS: Cardiac and mediastinal contours are normal. Lung volumes are slightly low. No focal consolidation or effusion. No pneumothorax.    No acute cardiopulmonary process.  Signed (Electronic Signature): 06/16/2024 8:21 AM Signed By: Dorothyann Jointer, MD  XR Chest 2 views Result Date: 06/14/2024 EXAMINATION: Chest radiograph  HISTORY:   Wheezing and shortness of breath. Chills.  TECHNIQUE: 2 views.  COMPARISON:  02/09/2021  FINDINGS:  Cardiomediastinal contours are stable. Minimal streaky densities at the perihilar lungs bilaterally are present, nonspecific. Atypical or viral process or bronchitis could give this appearance. No dense consolidation. There is no pneumothorax or pleural effusion. Regional skeleton demonstrates no acute osseous abnormality. Modest thoracic spondylosis.    Minimal streaky perihilar opacities bilaterally as above.  Signed (Electronic Signature): 06/14/2024 1:32 PM Signed By: Jeralyn Moss, MD   Lab Results   Recent Labs    06/17/24 0531  WBC 12.4*  HGB 13.5  HCT 38.2  PLT 231   Recent Labs    06/17/24 0531  NA 139  K 3.6  CL 102  CO2 26.2  BUN 20  CREATININE 0.93  GLU 181*  CALCIUM  9.4  MG 2.3   Recent Labs    06/14/24 1425  TROPONINI 5   No results for input(s): WBCUA, NITRITE, LEUKOCYTESUR, BACTERIA, RBCUA, BLOODU, GLUCOSEU, PROTEINUA, KETONESU, KETUR in the last 72 hours. No results for input(s): OPIAU, BENZU, TRICYCLIC, PCPU, AMPHU, COCAU, CANNAU, BARBU, ETOH, ACETAMIN, SALICYLATE in the last 72 hours. No results for input(s): PREGTESTUR, PREGPOC in the last 72 hours. Recent Labs    06/15/24 0431  A1C 7.0*   Recent Labs    06/14/24 1828  PHART 7.45  PCO2ART 29.2*  PO2ART 101  HCO3ART 20.4  O2SATART 98.0  BEART -2.3*     Home Medications   Prior to Admission medications  Medication Dose, Route, Frequency  acetaminophen  (TYLENOL ) 500 MG tablet 500-1,000  mg, Oral  albuterol HFA 90 mcg/actuation inhaler 2 puffs, Inhalation, Every 6 hours PRN  doxycycline (VIBRAMYCIN) 100 MG capsule 100 mg, Oral, 2 times a day (standard), Take with food  famotidine  (PEPCID ) 40 MG tablet TAKE 1 TABLET BY MOUTH AT BEDTIME FOR gerd  FARXIGA 10 mg Tab tablet TAKE 1 TABLET BY MOUTH EVERY DAY FOR 30 DAYS  fluticasone furoate-vilanterol (BREO ELLIPTA) 100-25 mcg/dose inhaler 1 puff, Inhalation, Daily (standard)  glipiZIDE (GLUCOTROL) 5 MG tablet 5 mg, Oral, 2 times a day (AC)  guaiFENesin (MUCINEX) 600 mg 12 hr tablet 600 mg, Oral, 2 times a day (standard)  hydroCHLOROthiazide  (HYDRODIURIL ) 25 MG tablet 1 tablet, Oral, Daily (standard)  HYDROcodone -homatropine (HYCODAN) 5-1.5 mg/5 mL (5 mL) syrup 5 mL, Oral, Every 6 hours PRN  hydroquinone 4 % cream APPLY TO DARK SPOTS AT BEDTIME AS NEEDED  ipratropium-albuterol (DUO-NEB) 0.5-2.5 mg/3 mL nebulizer 3 mL, Nebulization, Every 6 hours (RT)  levocetirizine (XYZAL) 5 MG tablet 1 tablet, Oral, Nightly  lidocaine  (LIDODERM ) 5 % patch 1 patch, Transdermal, Every 12 hours PRN, Apply to affected area for 12 hours only each day (then remove patch)  methocarbamol  (ROBAXIN ) 500 MG tablet 1,000 mg, Oral, 3 times a day PRN  omeprazole (PRILOSEC) 40 MG capsule take one tablet  by mouth daily for GERD take one tablet by mouth daily for GERD  ondansetron  (ZOFRAN ) 4 MG tablet   pantoprazole  (PROTONIX ) 40 MG tablet 1 tablet, Oral, Daily (standard)  predniSONE  (DELTASONE ) 20 MG tablet Take 2 tablets (40 mg total) by mouth daily for 3 days, THEN 1 tablet (20 mg total) daily for 3 days, THEN 0.5 tablets (10 mg total) daily for 4 days.  rosuvastatin  (CRESTOR ) 40 MG tablet 1 tablet, Oral, Daily (standard)   Brutus FORBES Shank, FNP Hospitalist, [facility] 06/17/24, 10:28 AM      [1] No past medical history on file. [2] Past Surgical History: Procedure Laterality Date  . JOINT REPLACEMENT    [3] History reviewed. No pertinent family  history. [4]  Current Facility-Administered Medications:  .  acetaminophen  (TYLENOL ) tablet 650 mg, 650 mg, Oral, Q4H PRN, Panwala, Naitik Dhansukh, PA, 650 mg at 06/15/24 2144 .  atorvastatin  (LIPITOR) tablet 80 mg, 80 mg, Oral, Nightly, Panwala, Naitik Dhansukh, PA, 80 mg at 06/16/24 2149 .  cefTRIAXone (ROCEPHIN) IVPB 1 g in 50 mL dextrose  (premix), 1 g, Intravenous, Q24H SCH, Stopped at 06/17/24 0927 **AND** azithromycin (ZITHROMAX) 500 mg in sodium chloride  (NS) 0.9 % 250 mL IVPB-vialmate, 500 mg, Intravenous, Q24H SCH, Panwala, Naitik Dhansukh, PA, Stopped at 06/16/24 2249 .  dextrose  (GLUTOSE) 40 % gel 15 g of dextrose , 15 g of dextrose , Oral, Q10 Min PRN, Panwala, Naitik Dhansukh, PA .  dextrose  50 % in water  (D50W) 50 % solution 12.5 g, 12.5 g, Intravenous, Q15 Min PRN, Panwala, Naitik Dhansukh, PA .  docusate sodium  (COLACE) capsule 100 mg, 100 mg, Oral, BID PRN, Panwala, Naitik Dhansukh, PA .  enoxaparin (LOVENOX) syringe 40 mg, 40 mg, Subcutaneous, Q12H, Panwala, Naitik Dhansukh, PA, 40 mg at 06/17/24 0844 .  famotidine  (PEPCID ) tablet 40 mg, 40 mg, Oral, Daily, Panwala, Naitik Dhansukh, PA, 40 mg at 06/17/24 0844 .  glucagon injection 1 mg, 1 mg, Intramuscular, Once PRN, Panwala, Naitik Dhansukh, PA .  guaiFENesin (MUCINEX) 12 hr tablet 600 mg, 600 mg, Oral, BID, Pace, Hagan Eggleston, FNP, 600 mg at 06/17/24 0844 .  hydroCHLOROthiazide  (HYDRODIURIL ) tablet 25 mg, 25 mg, Oral, Daily, Panwala, Naitik Dhansukh, PA, 25 mg at 06/17/24 0844 .  HYDROcodone -homatropine (HYCODAN) 5-1.5 mg/5 mL (5 mL) syrup 5 mg of hydrocodone , 5 mg of hydrocodone , Oral, Q4H PRN, Pace, Hagan Eggleston, FNP, 5 mg of hydrocodone  at 06/17/24 0843 .  insulin  lispro (HumaLOG) injection CORRECTIONAL 0-20 Units, 0-20 Units, Subcutaneous, ACHS, Panwala, [place], [place], 3 Units at 06/17/24 0820 .  ipratropium-albuterol (DUO-NEB) 0.5-2.5 mg/3 mL nebulizer solution 3 mL, 3 mL, Nebulization, Q6H (RT), Shank Brutus Bryant, FNP, 3 mL at 06/17/24 0908 .  lidocaine  (ASPERCREME) 4 % 1 patch, 1 patch, Transdermal, Daily, Panwala, Naitik Dhansukh, PA, 1 patch at 06/16/24 0843 .  loratadine (CLARITIN) tablet 10 mg, 10 mg, Oral, Daily, Panwala, Naitik Dhansukh, PA, 10 mg at 06/17/24 0844 .  methocarbamol  (ROBAXIN ) tablet 1,000 mg, 1,000 mg, Oral, Q8H PRN, Panwala, Naitik Dhansukh, PA .  ondansetron  (ZOFRAN ) injection 4 mg, 4 mg, Intravenous, Q6H PRN, Panwala, Naitik Dhansukh, PA .  pantoprazole  (Protonix ) EC tablet 40 mg, 40 mg, Oral, Daily before breakfast, Panwala, Naitik Dhansukh, PA, 40 mg at 06/17/24 0844 .  phenol (CHLORASEPTIC) 1.4 % spray 2 spray, 2 spray, Mucous Membrane, Q2H PRN, Panwala, Naitik Dhansukh, PA, 2 spray at 06/15/24 1231 .  [START ON 06/18/2024] predniSONE  (DELTASONE ) tablet 40 mg, 40 mg, Oral, Daily, Pace, Hagan Eggleston, FNP

## 2024-06-17 NOTE — Nursing Note (Signed)
   06/17/24 1218  Final Assessment  Patient's Post Acute Contact Information See demo  Has a PCP appointment been made? Yes  Is appointment within 7 days of discharge? No  Has a specialist appointment been made? Yes  Post Acute Facility needed at discharge? No  Home Care/ Home Medical Equipment needed at discharge? Yes  Home Care/ Home Medical Equipment [facility]ME [facility] Medical Supplies) (specify) (Nebulizer)  HME Agency (Name/Phone #) [facility]  Outpatient/Community Referrals needed for discharge? No  Currently receiving outpatient dialysis? No  Discharge Disposition Home w/ Self Care  Transportation Anticipated family or friend will provide  Quality data for continuing care services shared with patient and/or representative? Yes  Patient and/or family were provided with choice of facilities / services that are available and appropriate to meet post hospital care needs? N/A  Final Assessment Complete  Final Assessment Complete Yes

## 2024-06-21 ENCOUNTER — Telehealth: Payer: Self-pay

## 2024-06-21 DIAGNOSIS — J159 Unspecified bacterial pneumonia: Secondary | ICD-10-CM | POA: Diagnosis not present

## 2024-06-21 NOTE — Transitions of Care (Post Inpatient/ED Visit) (Signed)
   06/21/2024  Name: Rhonda Patton MRN: [id number] DOB: 1961-07-06  Today's TOC FU Call Status: Today's TOC FU Call Status:: Unsuccessful Call (1st Attempt) Unsuccessful Call (1st Attempt) Date: 06/21/24  Attempted to reach the patient regarding the most recent Inpatient/ED visit.  Follow Up Plan: Additional outreach attempts will be made to reach the patient to complete the Transitions of Care (Post Inpatient/ED visit) call.   Beverley Sherrard J. Akosua Constantine RN, MSN [facility], [facility] RN Care Manager Direct Dial: [phone]  Fax: [phone] Website: [email or web address]

## 2024-06-22 ENCOUNTER — Telehealth: Payer: Self-pay

## 2024-06-22 NOTE — Patient Instructions (Signed)
 Visit Information  Thank you for taking time to visit with me today. Please don't hesitate to contact me if I can be of assistance to you before our next scheduled telephone appointment.  Our next appointment is by telephone on 06/30/24 at 1000 am  Following is a copy of your care plan:   Goals Addressed             This Visit's Progress    VBCI Transitions of Care (TOC) Care Plan       Problems:  Recent Hospitalization for treatment of Asthma Knowledge Deficit Related to Asthma and Medication access barrier Unable to afford Breo.    Goal:  Over the next 30 days, the patient will not experience hospital readmission  Interventions:  Transitions of Care: Doctor Visits  - discussed the importance of doctor visits Communication with Ozell Maize re: [place] and affordability.  In basket message sent Patient assistant application mailed to patient for Breo Asthma: Provided patient with basic written and verbal Asthma education on self care/management/and exacerbation prevention Provided instruction about proper use of medications used for management of Asthma including inhalers Provided education about and advised patient to utilize infection prevention strategies to reduce risk of respiratory infection Discussed the importance of adequate rest and management of fatigue with Asthma Assessed social determinant of health barriers  Advised when to seek Emergency care:  Symptoms do not improve after using the quick-relief inhaler as directed.  The patient is experiencing severe symptoms or difficulty breathing.  The patient needs to use their rescue inhaler frequently.   Patient Self Care Activities:  Attend all scheduled provider appointments Call pharmacy for medication refills 3-7 days in advance of running out of medications Call provider office for new concerns or questions  Notify RN Care Manager of TOC call rescheduling needs Participate in Transition of Care Program/Attend TOC  scheduled calls Take medications as prescribed   Pace self with activities Stay away from things that trigger your asthma. Stay away from sick contacts.   Plan:  The patient has been provided with contact information for the care management team and has been advised to call with any health related questions or concerns.         Patient verbalizes understanding of instructions and care plan provided today and agrees to view in MyChart. Active MyChart status and patient understanding of how to access instructions and care plan via MyChart confirmed with patient.     The patient has been provided with contact information for the care management team and has been advised to call with any health related questions or concerns.   Please call the care guide team at [phone] if you need to cancel or reschedule your appointment.   Please call the Suicide and Crisis Lifeline: 988 if you are experiencing a Mental Health or Behavioral Health Crisis or need someone to talk to.  Shana Zavaleta J. Lilienne Weins RN, MSN [facility], [facility] RN Care Manager Direct Dial: [phone]  Fax: [phone] Website: [email or web address]

## 2024-06-22 NOTE — Transitions of Care (Post Inpatient/ED Visit) (Signed)
 06/22/2024  Name: Rhonda Patton MRN: [id number] DOB: 10-28-1960  Today's TOC FU Call Status: Today's TOC FU Call Status:: Successful TOC FU Call Completed TOC FU Call Complete Date: 06/22/24 Patient's Name and Date of Birth confirmed.  Transition Care Management Follow-up Telephone Call Date of Discharge: 06/17/24 Discharge Facility: Other ([facility]) Name of Other ([facility]) Discharge Facility: [facility] Type of Discharge: Inpatient Admission Primary Inpatient Discharge Diagnosis:: Severe asthma with exacerbation How have you been since you were released from the hospital?: Better Any questions or concerns?: Yes Patient Questions/Concerns:: Unable to afford Breo- patient has commerical insurance-advised to go to [email or web address] for discount card. Patient Questions/Concerns Addressed: Other:  Items Reviewed: Did you receive and understand the discharge instructions provided?: Yes Medications obtained,verified, and reconciled?: Yes (Medications Reviewed) Any new allergies since your discharge?: No Dietary orders reviewed?: Yes Type of Diet Ordered:: Carbohydrate modified  Medications Reviewed Today: Medications Reviewed Today     Reviewed by Quianna Avery, RN (Case Manager) on 06/22/24 at 1310  Med List Status: <None>   Medication Order Taking? Sig Documenting Provider Last Dose Status Informant  acetaminophen  (TYLENOL ) 500 MG tablet [id number] Yes Take 500-1,000 mg by mouth every 6 (six) hours as needed for moderate pain (pain score 4-6). [provider]  Active Self  atorvastatin  (LIPITOR) 40 MG tablet [id number]  Take 40 mg by mouth at bedtime.  Patient not taking: Reported on 06/22/2024   [provider]  Active Self  Cholecalciferol (VITAMIN D-3 PO) [id number] Yes Take 1 tablet by mouth every evening. [provider]  Active Self  famotidine  (PEPCID ) 40 MG tablet [id number] Yes Take 40 mg by mouth at bedtime. [provider]  Active Self  hydrochlorothiazide  (HYDRODIURIL ) 25 MG tablet [id number] Yes Take 25 mg by mouth in the morning. [provider]  Active Self  HYDROmorphone  (DILAUDID ) 2 MG tablet [id number]  Take 1-2 tablets (2-4 mg total) by mouth every 6 (six) hours as needed for severe pain (pain score 7-10).  Patient not taking: Reported on 06/22/2024   Kristian Stabs, [place]  Active   hydroquinone 4 % cream [id number] Yes Apply 1 application  topically at bedtime. [provider]  Active Self  levocetirizine (XYZAL) 5 MG tablet [id number] Yes Take 5 mg by mouth daily as needed for allergies.  Patient taking differently: Take 5 mg by mouth every evening.   [provider]  Active Self  pantoprazole  (PROTONIX ) 40 MG tablet [id number] Yes Take 1 tablet (40 mg total) by mouth 2 (two) times daily before a meal. Shirlean Therisa ORN, NP  Active   traMADol  (ULTRAM ) 50 MG tablet [id number]  Take 1-2 tablets (50-100 mg total) by mouth every 6 (six) hours as needed for moderate pain (pain score 4-6).  Patient not taking: Reported on 06/22/2024   Kristian Stabs, PA  Active             Home Care and Equipment/Supplies: Were Home Health Services Ordered?: No Any new equipment or medical supplies ordered?: Yes Name of Medical supply agency?: [employer] Were you able to get the equipment/medical supplies?: Yes Do you have any questions related to the use of the equipment/supplies?: No  Functional Questionnaire: Do you need assistance with bathing/showering or dressing?: Yes Do you need assistance with meal preparation?: Yes Do you need assistance with eating?: Yes Do you have difficulty maintaining continence: Yes Do you need assistance with getting out of bed/getting out of a chair/moving?: Yes Do you have difficulty managing  or taking your medications?: Yes  Follow up appointments reviewed: PCP Follow-up appointment confirmed?: Yes Date of PCP follow-up appointment?: 06/29/24 Follow-up  Provider: Dr. Milford office Specialist [facility] Follow-up appointment confirmed?: NA (Pulmonlogy referral pending) Do you need transportation to your follow-up appointment?: No Do you understand care options if your condition(s) worsen?: Yes-patient verbalized understanding  SDOH Interventions Today    Flowsheet Row Most Recent Value  SDOH Interventions   Food Insecurity Interventions Intervention Not Indicated  Housing Interventions Intervention Not Indicated  Transportation Interventions Intervention Not Indicated  Utilities Interventions Intervention Not Indicated    Alphonsus Doyel J. Reeder Brisby RN, MSN [facility]  [facility], [facility] RN Care Manager Direct Dial: [phone]  Fax: [phone] Website: [email or web address]

## 2024-06-29 DIAGNOSIS — Z8701 Personal history of pneumonia (recurrent): Secondary | ICD-10-CM | POA: Diagnosis not present

## 2024-06-29 DIAGNOSIS — J45909 Unspecified asthma, uncomplicated: Secondary | ICD-10-CM | POA: Diagnosis not present

## 2024-06-29 DIAGNOSIS — K449 Diaphragmatic hernia without obstruction or gangrene: Secondary | ICD-10-CM | POA: Diagnosis not present

## 2024-06-30 ENCOUNTER — Other Ambulatory Visit: Payer: Self-pay

## 2024-06-30 NOTE — Patient Instructions (Signed)
/  Visit Information/  Thank you for taking time to visit with me today. Please don't hesitate to contact me if I can be of assistance to you before our next scheduled telephone appointment.  Our next appointment is by telephone on 07/07/24 at 1100 am  Following is a copy of your care plan:   Goals Addressed             This Visit's Progress    [facility]I Transitions of Care (TOC) Care Plan       Problems:  Recent Hospitalization for treatment of Asthma Knowledge Deficit Related to Asthma and Medication access barrier Now has symbicort.    Goal:  Over the next 30 days, the patient will not experience hospital readmission  Interventions:  Transitions of Care: Doctor Visits  - discussed the importance of doctor visits Pharmacist messaged back stating he tried to reach patient but was unsuccessful but he will try to outreach again.    Asthma: Provided instruction about proper use of medications used for management of Asthma including inhalers Provided education about and advised patient to utilize infection prevention strategies to reduce risk of respiratory infection Discussed the importance of adequate rest and management of fatigue with Asthma Advised when to seek Emergency care:  Symptoms do not improve after using the quick-relief inhaler as directed.  The patient is experiencing severe symptoms or difficulty breathing.  The patient needs to use their rescue inhaler frequently.   Patient Self Care Activities:  Attend all scheduled provider appointments Call pharmacy for medication refills 3-7 days in advance of running out of medications Call provider office for new concerns or questions  Notify RN Care Manager of TOC call rescheduling needs Participate in Transition of Care Program/Attend TOC scheduled calls Take medications as prescribed   Pace self with activities Stay away from things that trigger your asthma. Stay away from sick contacts.   Plan:  The patient has been  provided with contact information for the care management team and has been advised to call with any health related questions or concerns.         Patient verbalizes understanding of instructions and care plan provided today and agrees to view in MyChart. Active MyChart status and patient understanding of how to access instructions and care plan via MyChart confirmed with patient.     The patient has been provided with contact information for the care management team and has been advised to call with any health related questions or concerns.   Please call the care guide team at [phone] if you need to cancel or reschedule your appointment.   Please call the Suicide and Crisis Lifeline: 988 if you are experiencing a Mental Health or Behavioral Health Crisis or need someone to talk to.  Dayshon Roback J. Chaynce Schafer RN, MSN [facility], [facility] RN Care Manager Direct Dial: [phone]  Fax: [phone] Website: [email or web address]

## 2024-06-30 NOTE — Transitions of Care (Post Inpatient/ED Visit) (Signed)
 Transition of Care week 2  Visit Note  06/30/2024  Name: Rhonda Patton MRN: [id number]          DOB: 07-Oct-1961  Situation: Patient enrolled in [facility] 30-day program. Visit completed with patient by telephone.   Background:   Initial Transition Care Management Follow-up Telephone Call    Past Medical History:  Diagnosis Date   Arthritis    Barrett's esophagus    Carpal tunnel syndrome of right wrist    Chronic pain    Diabetes (HCC)    GERD (gastroesophageal reflux disease)    Hypercholesteremia    Hypertension    PONV (postoperative nausea and vomiting)    Sleep apnea    diagnosed but doesn't use CPAP;     Assessment: Patient Reported Symptoms: Cognitive Cognitive Status: Alert and oriented to person, place, and time, Normal speech and language skills      Neurological Neurological Review of Symptoms: No symptoms reported    HEENT HEENT Symptoms Reported: No symptoms reported      Cardiovascular Cardiovascular Symptoms Reported: No symptoms reported    Respiratory Respiratory Symptoms Reported: Shortness of breath Other Respiratory Symptoms: Shortness of breath with exertion Additional Respiratory Details: Asthma- Recently got symbicort and airspura.  Reviewed usage and importance of rinsing mouth after use.  Pulmonary referral pending. Respiratory Management Strategies: Medication therapy, Routine screening Respiratory Self-Management Outcome: 4 (good)  Endocrine Endocrine Symptoms Reported: No symptoms reported Is patient diabetic?: Yes Is patient checking blood sugars at home?: Yes List most recent blood sugar readings, include date and time of day: Blood sugars ower in the 150-170 range per patient.  Continues on glipzide.  Follow up lab work with PCP in a few weeks.  Reiterated carb modified diet Endocrine Self-Management Outcome: 4 (good)  Gastrointestinal Gastrointestinal Symptoms Reported: No symptoms reported      Genitourinary Genitourinary  Symptoms Reported: No symptoms reported    Integumentary Integumentary Symptoms Reported: No symptoms reported    Musculoskeletal Musculoskelatal Symptoms Reviewed: Weakness Additional Musculoskeletal Details: Reports doing better daily, encouraged to pace self with activitiy Musculoskeletal Management Strategies: Exercise, Adequate rest, Routine screening, Activity Musculoskeletal Self-Management Outcome: 4 (good)      Psychosocial Psychosocial Symptoms Reported: No symptoms reported         There were no vitals filed for this visit.  Medications Reviewed Today     Reviewed by Meron Bocchino, RN (Case Manager) on 06/30/24 at 1019  Med List Status: <None>   Medication Order Taking? Sig Documenting Provider Last Dose Status Informant  acetaminophen  (TYLENOL ) 500 MG tablet [id number]  Take 500-1,000 mg by mouth every 6 (six) hours as needed for moderate pain (pain score 4-6). [provider]  Active Self  albuterol (VENTOLIN HFA) 108 (90 Base) MCG/ACT inhaler [id number]  Inhale 2 puffs into the lungs every 6 (six) hours as needed for wheezing or shortness of breath. [provider]  Active   atorvastatin  (LIPITOR) 40 MG tablet [id number]  Take 40 mg by mouth at bedtime.  Patient not taking: Reported on 06/22/2024   [provider]  Active Self  Cholecalciferol (VITAMIN D-3 PO) [id number]  Take 1 tablet by mouth every evening. [provider]  Active Self  famotidine  (PEPCID ) 40 MG tablet [id number]  Take 40 mg by mouth at bedtime. [provider]  Active Self  fluticasone furoate-vilanterol (BREO ELLIPTA) 100-25 MCG/ACT AEPB [id number]  Inhale 1 puff into the lungs daily.  Patient not taking: Reported on 06/30/2024   [provider]  Active   glipiZIDE (GLUCOTROL) 5 MG tablet [id number] Yes Take 5 mg by mouth 2 (two) times daily before a meal. [provider]  Active   hydrochlorothiazide  (HYDRODIURIL ) 25 MG tablet [id number]  Take 25  mg by mouth in the morning. [provider]  Active Self  HYDROcodone  bit-homatropine (HYCODAN) 5-1.5 MG/5ML syrup [id number]  Take 5 mLs by mouth every 6 (six) hours as needed for cough. [provider]  Active   HYDROmorphone  (DILAUDID ) 2 MG tablet [id number]  Take 1-2 tablets (2-4 mg total) by mouth every 6 (six) hours as needed for severe pain (pain score 7-10).  Patient not taking: Reported on 06/30/2024   Kristian Stabs, PA  Active   hydroquinone 4 % cream [id number]  Apply 1 application  topically at bedtime.  Patient not taking: Reported on 06/30/2024   [provider]  Active Self  ipratropium-albuterol (DUONEB) 0.5-2.5 (3) MG/3ML SOLN [id number]  Take 3 mLs by nebulization every 6 (six) hours. [provider]  Active   levocetirizine (XYZAL) 5 MG tablet [id number] Yes Take 5 mg by mouth daily as needed for allergies.  Patient taking differently: Take 5 mg by mouth every evening.   [provider]  Active Self  pantoprazole  (PROTONIX ) 40 MG tablet [id number] Yes Take 1 tablet (40 mg total) by mouth 2 (two) times daily before a meal. Shirlean Therisa ORN, NP  Active   predniSONE  (DELTASONE ) 20 MG tablet [id number] Yes Take 20 mg by mouth. Take 2 tablets (40 mg total) by mouth daily for 3 days, THEN 1 tablet (20 mg total) daily for 3 days, THEN 0.5 tablets (10 mg total) daily for 4 days. [provider]  Active   traMADol  (ULTRAM ) 50 MG tablet [id number] Yes Take 1-2 tablets (50-100 mg total) by mouth every 6 (six) hours as needed for moderate pain (pain score 4-6). Kristian Stabs, PA  Active             Goals Addressed             This Visit's Progress    VBCI Transitions of Care (TOC) Care Plan       Problems:  Recent Hospitalization for treatment of Asthma Knowledge Deficit Related to Asthma and Medication access barrier Now has symbicort.    Goal:  Over the next 30 days, the patient will not experience hospital  readmission  Interventions:  Transitions of Care: Doctor Visits  - discussed the importance of doctor visits Pharmacist messaged back stating he tried to reach patient but was unsuccessful but he will try to outreach again.    Asthma: Provided instruction about proper use of medications used for management of Asthma including inhalers Provided education about and advised patient to utilize infection prevention strategies to reduce risk of respiratory infection Discussed the importance of adequate rest and management of fatigue with Asthma Advised when to seek Emergency care:  Symptoms do not improve after using the quick-relief inhaler as directed.  The patient is experiencing severe symptoms or difficulty breathing.  The patient needs to use their rescue inhaler frequently.   Patient Self Care Activities:  Attend all scheduled provider appointments Call pharmacy for medication refills 3-7 days in advance of running out of medications Call provider office for new concerns or questions  Notify RN Care Manager of TOC call rescheduling needs Participate in Transition of Care Program/Attend TOC scheduled calls Take medications as prescribed   Pace self with activities Stay away from things that trigger your asthma.  Stay away from sick contacts.   Plan:  The patient has been provided with contact information for the care management team and has been advised to call with any health related questions or concerns.         Recommendation:   Continue Current Plan of Care  Follow Up Plan:   Telephone follow-up in 1 week  Carrick Rijos J. Laster Appling RN, MSN [facility], [facility] RN Care Manager Direct Dial: [phone]  Fax: [phone] Website: [email or web address]

## 2024-07-06 ENCOUNTER — Other Ambulatory Visit: Payer: Self-pay | Admitting: Gastroenterology

## 2024-07-06 NOTE — Progress Notes (Unsigned)
 [address]       [place] [address]             [phone]                    [name] [facility] Medical Record #[id number] Date of Birth: [birth year]  Referring: Hyacinth Honey, NP Primary Care: Shona Norleen PEDLAR, MD Primary Cardiologist: None  Chief Complaint:    Chief Complaint  Patient presents with   Hiatal Hernia    New patient consult, CTA chest 6/27, upper Endo 5/8    History of Present Illness:    Rhonda Patton 63 y.o. female presents for surgical evaluation of a hiatal hernia.  She has a long history of medically controlled reflux.  Recently, she has been experiencing significant dyphagia with most meals.  She underwent an upper endoscopy which identified short- segment Barrett' s disease, and a  Schatzki ring.  She was also recently diagnosed with adult onset asthma.       GERD symptoms: yes Dysphagia: yes Odynophagia: no Voice changes: no Respiratory symptoms: yes Weight changes: no Hx of Barrett's Esphagus: yes    Past Medical History:  Diagnosis Date   Arthritis    Barrett's esophagus    Carpal tunnel syndrome of right wrist    Chronic pain    Diabetes (HCC)    GERD (gastroesophageal reflux disease)    Hypercholesteremia    Hypertension    PONV (postoperative nausea and vomiting)    Sleep apnea    diagnosed but doesn't use CPAP;     Past Surgical History:  Procedure Laterality Date   ABDOMINAL HYSTERECTOMY     BALLOON DILATION N/A 10/09/2020   Procedure: BALLOON DILATION;  Surgeon: Cindie Carlin POUR, DO;  [facility];  Service: Endoscopy;  Laterality: N/A;   BIOPSY  08/25/2017   Procedure: BIOPSY;  Surgeon: Harvey Margo CROME, MD;  [facility];  Service: Endoscopy;;  gastric biopsy, gastric polyp   BIOPSY  10/09/2020   Procedure: BIOPSY;  Surgeon: Cindie Carlin POUR, DO;  [facility];  Service: Endoscopy;;   BREAST BIOPSY Left 09/16/2022   US  LT BREAST BX W LOC DEV 1ST  LESION IMG BX SPEC US  GUIDE 09/16/2022 [facility]   BREAST BIOPSY WITH RADIO FREQUENCY LOCALIZER Left 10/29/2022   Procedure: BREAST BIOPSY WITH RADIO FREQUENCY LOCALIZER;  Surgeon: Evonnie Dorothyann LABOR, DO;  [facility]: [facility];  Service: General;  Laterality: Left;   CATARACT EXTRACTION W/PHACO Right 05/01/2023   Procedure: CATARACT EXTRACTION PHACO AND INTRAOCULAR LENS PLACEMENT (IOC);  Surgeon: Juli Blunt, MD;  [facility]: [facility];  Service: Ophthalmology;  Laterality: Right;  CDE 2.88   CESAREAN SECTION     x 3   CHOLECYSTECTOMY     COLONOSCOPY N/A 02/25/2024   Procedure: COLONOSCOPY;  Surgeon: Cindie Carlin POUR, DO;  [facility];  Service: Endoscopy;  Laterality: N/A;  1:30PM;ASA 3   COLONOSCOPY WITH PROPOFOL  N/A 08/25/2017   TI appeared normal. 5 mm polyp in rectum. Sessile. Redundant colon. Internal hemorrhoids. Hyperplastic.    ESOPHAGEAL DILATION N/A 02/25/2024   Procedure: DILATION, ESOPHAGUS;  Surgeon: Cindie Carlin POUR, DO;  [facility];  Service: Endoscopy;  Laterality: N/A;  1:30PM;ASA 3   ESOPHAGOGASTRODUODENOSCOPY N/A 02/25/2024   Procedure: EGD (ESOPHAGOGASTRODUODENOSCOPY);  Surgeon: Cindie Carlin POUR, DO;  [facility];  Service: Endoscopy;  Laterality: N/A;  1:30PM;ASA 3   ESOPHAGOGASTRODUODENOSCOPY (EGD)  WITH PROPOFOL  N/A 08/25/2017   benign-apearing esophageal stenosis, dilation, small hiatal hernia, mild gastritis due to NSAIDs. Negative H.pylori.    ESOPHAGOGASTRODUODENOSCOPY (EGD) WITH PROPOFOL  N/A 10/09/2020   Z-line irregular, benign-appearing esophageal stenosis, small hiatal hernia, gastritis, normal duodenum. +Barrett's esophagus, reactive gastropathy with erosions, negative celiac. Surveillance EGD in 2024.    FOOT SURGERY Right    x 2-bone spur   GASTROCNEMIUS RECESSION Right 12/18/2020   Procedure: RIGHT GASTROCNEMIUS RECSSION AND PLANTAR FASCIA RELEASE;  Surgeon: Harden Jerona GAILS, MD;  Location: [facility];  Service:  Orthopedics;  Laterality: Right;   HAND SURGERY Right    x 2-carpal tunnel   KNEE ARTHROSCOPY Left    KNEE ARTHROSCOPY WITH MEDIAL MENISECTOMY Left 05/07/2017   Procedure: KNEE ARTHROSCOPY WITH MEDIAL MENISECTOMY AND LATERAL MENISECTOMY, limited debriedment;  Surgeon: Margrette Taft BRAVO, MD;  [facility];  Service: Orthopedics;  Laterality: Left;   PARTIAL HYSTERECTOMY  2001   total abdominal   POLYPECTOMY  08/25/2017   Procedure: POLYPECTOMY;  Surgeon: Harvey Margo CROME, MD;  [facility];  Service: Endoscopy;;  rectal polyp cs   SAVORY DILATION N/A 08/25/2017   Procedure: SAVORY DILATION;  Surgeon: Harvey Margo CROME, MD;  [facility];  Service: Endoscopy;  Laterality: N/A;   TOTAL KNEE ARTHROPLASTY Left 09/24/2017   Procedure: TOTAL KNEE ARTHROPLASTY;  Surgeon: Margrette Taft BRAVO, MD;  [facility];  Service: Orthopedics;  Laterality: Left;   TOTAL KNEE ARTHROPLASTY Right 09/14/2023   Procedure: TOTAL KNEE ARTHROPLASTY;  Surgeon: Melodi Lerner, MD;  Location: [facility];  Service: Orthopedics;  Laterality: Right;   TOTAL KNEE REVISION Left 03/05/2022   Procedure: Left knee polyethylene revision;  Surgeon: Melodi Lerner, MD;  Location: [facility];  Service: Orthopedics;  Laterality: Left;   TRIGGER FINGER RELEASE Right 11/27/2016   Procedure: RELEASE TRIGGER FINGER/A-1 PULLEY RIGHT LONG FINGER;  Surgeon: Taft BRAVO Margrette, MD;  Location: [facility];  Service: Orthopedics;  Laterality: Right;  pt knows to arrive at 8:45    Family History  Problem Relation Age of Onset   Cancer Mother 51 - 44       breast   Colon cancer Mother 51   Heart disease Other    Arthritis Other    Cancer Other    Diabetes Other    Gastric cancer Neg Hx    Esophageal cancer Neg Hx      Social History   Tobacco Use  Smoking Status Never  Smokeless Tobacco Never    Social History   Substance and Sexual Activity  Alcohol Use No     Allergies  Allergen Reactions   Codeine Shortness  Of Breath and Nausea And Vomiting   Aspirin Nausea Only    Current Outpatient Medications  Medication Sig Dispense Refill   acetaminophen  (TYLENOL ) 500 MG tablet Take 500-1,000 mg by mouth every 6 (six) hours as needed for moderate pain (pain score 4-6).     albuterol (VENTOLIN HFA) 108 (90 Base) MCG/ACT inhaler Inhale 2 puffs into the lungs every 6 (six) hours as needed for wheezing or shortness of breath.     atorvastatin  (LIPITOR) 40 MG tablet Take 40 mg by mouth at bedtime.     Cholecalciferol (VITAMIN D-3 PO) Take 1 tablet by mouth every evening.     famotidine  (PEPCID ) 40 MG tablet Take 40 mg by mouth at bedtime.     fluticasone furoate-vilanterol (BREO ELLIPTA) 100-25 MCG/ACT AEPB Inhale 1 puff into the lungs daily.  glipiZIDE (GLUCOTROL) 5 MG tablet Take 5 mg by mouth 2 (two) times daily before a meal.     hydrochlorothiazide  (HYDRODIURIL ) 25 MG tablet Take 25 mg by mouth in the morning.  1   hydroquinone 4 % cream Apply 1 application  topically at bedtime.     ipratropium-albuterol (DUONEB) 0.5-2.5 (3) MG/3ML SOLN Take 3 mLs by nebulization every 6 (six) hours.     levocetirizine (XYZAL) 5 MG tablet Take 5 mg by mouth daily as needed for allergies. (Patient taking differently: Take 5 mg by mouth every evening.)     pantoprazole  (PROTONIX ) 40 MG tablet TAKE 1 TABLET BY MOUTH TWICE DAILY BEFORE MEALS 60 tablet 1   predniSONE  (DELTASONE ) 20 MG tablet Take 20 mg by mouth. Take 2 tablets (40 mg total) by mouth daily for 3 days, THEN 1 tablet (20 mg total) daily for 3 days, THEN 0.5 tablets (10 mg total) daily for 4 days.     traMADol  (ULTRAM ) 50 MG tablet Take 1-2 tablets (50-100 mg total) by mouth every 6 (six) hours as needed for moderate pain (pain score 4-6). 40 tablet 0   No current facility-administered medications for this visit.    Review of Systems  Constitutional:  Negative for malaise/fatigue.  Respiratory:  Positive for cough and shortness of breath.   Cardiovascular:   Positive for chest pain.  Gastrointestinal:  Positive for heartburn.  Neurological: Negative.      PHYSICAL EXAMINATION: BP 135/80 (BP Location: Left Arm, Patient Position: Sitting, Cuff Size: Large)   Pulse 74   Resp 20   Ht 5' 3 (1.6 m)   Wt 253 lb 14.4 oz (115.2 kg)   SpO2 97% Comment: RA  BMI 44.98 kg/m  Physical Exam Constitutional:      General: She is not in acute distress.    Appearance: She is not ill-appearing.  Eyes:     Extraocular Movements: Extraocular movements intact.  Cardiovascular:     Rate and Rhythm: Normal rate.  Musculoskeletal:        General: Normal range of motion.     Cervical back: Normal range of motion.  Skin:    General: Skin is warm and dry.  Neurological:     General: No focal deficit present.     Mental Status: She is alert and oriented to person, place, and time.     Diagnostic Studies & Laboratory data:    EGD: 2025 Esophageal mucosal changes secondary to established short- segment Barrett' s disease. Biopsied. - Mild Schatzki ring. Dilated. - Small hiatal hernia.      I have independently reviewed the above radiology studies  and reviewed the findings with the patient.   Recent Lab Findings: Lab Results  Component Value Date   WBC 4.5 04/15/2024   HGB 14.0 04/15/2024   HCT 42.3 04/15/2024   PLT 213 04/15/2024   GLUCOSE 86 04/15/2024   ALT 31 04/18/2021   AST 23 04/18/2021   NA 139 04/15/2024   K 3.9 04/15/2024   CL 103 04/15/2024   CREATININE 0.98 04/15/2024   BUN 10 04/15/2024   CO2 26 04/15/2024   INR 1.08 09/22/2017   HGBA1C 5.9 (H) 09/04/2023      Assessment / Plan:   63yo female with small hiatal hernia, dysphagia, short- segment Barrett' s disease, a mild Schatzki ring and shortness of breath.  She is also obese with a BMI of 45.  We discussed several options for treatment including a bariatric surgery along with  her hiatal hernia repair.  I advised this given her higher risk of recurrence, but she stated  that she did not want to delay things by seeing another surgeon.  She would like to expidite this given her symptoms and anxiety whenever she has chest pain.  In that regard, it may be due to her ring, which may still persists after the operation.  I explained this to her as well.  She stated that she would like to proceed with a hiatal hernia repair.  I will also perform an EGD and a Toupet fundoplication.  She will need an esophagram       I  spent 40 minutes with  the patient face to face and greater then 50% of the time was spent in counseling and coordination of care.    Rhonda Patton 07/08/2024 10:40 AM

## 2024-07-06 NOTE — H&P (View-Only) (Signed)
 [address]       [place] [address]             [phone]                    [name] [facility] Medical Record #[id number] Date of Birth: [birth year]  Referring: Hyacinth Honey, NP Primary Care: Shona Norleen PEDLAR, MD Primary Cardiologist: None  Chief Complaint:    Chief Complaint  Patient presents with   Hiatal Hernia    New patient consult, CTA chest 6/27, upper Endo 5/8    History of Present Illness:    Rhonda Patton 63 y.o. female presents for surgical evaluation of a hiatal hernia.  She has a long history of medically controlled reflux.  Recently, she has been experiencing significant dyphagia with most meals.  She underwent an upper endoscopy which identified short- segment Barrett' s disease, and a  Schatzki ring.  She was also recently diagnosed with adult onset asthma.       GERD symptoms: yes Dysphagia: yes Odynophagia: no Voice changes: no Respiratory symptoms: yes Weight changes: no Hx of Barrett's Esphagus: yes    Past Medical History:  Diagnosis Date   Arthritis    Barrett's esophagus    Carpal tunnel syndrome of right wrist    Chronic pain    Diabetes (HCC)    GERD (gastroesophageal reflux disease)    Hypercholesteremia    Hypertension    PONV (postoperative nausea and vomiting)    Sleep apnea    diagnosed but doesn't use CPAP;     Past Surgical History:  Procedure Laterality Date   ABDOMINAL HYSTERECTOMY     BALLOON DILATION N/A 10/09/2020   Procedure: BALLOON DILATION;  Surgeon: Cindie Carlin POUR, DO;  [facility];  Service: Endoscopy;  Laterality: N/A;   BIOPSY  08/25/2017   Procedure: BIOPSY;  Surgeon: Harvey Margo CROME, MD;  [facility];  Service: Endoscopy;;  gastric biopsy, gastric polyp   BIOPSY  10/09/2020   Procedure: BIOPSY;  Surgeon: Cindie Carlin POUR, DO;  [facility];  Service: Endoscopy;;   BREAST BIOPSY Left 09/16/2022   US  LT BREAST BX W LOC DEV 1ST  LESION IMG BX SPEC US  GUIDE 09/16/2022 [facility]   BREAST BIOPSY WITH RADIO FREQUENCY LOCALIZER Left 10/29/2022   Procedure: BREAST BIOPSY WITH RADIO FREQUENCY LOCALIZER;  Surgeon: Evonnie Dorothyann LABOR, DO;  [facility];  Service: General;  Laterality: Left;   CATARACT EXTRACTION W/PHACO Right 05/01/2023   Procedure: CATARACT EXTRACTION PHACO AND INTRAOCULAR LENS PLACEMENT (IOC);  Surgeon: Juli Blunt, MD;  [facility];  Service: Ophthalmology;  Laterality: Right;  CDE 2.88   CESAREAN SECTION     x 3   CHOLECYSTECTOMY     COLONOSCOPY N/A 02/25/2024   Procedure: COLONOSCOPY;  Surgeon: Cindie Carlin POUR, DO;  [facility];  Service: Endoscopy;  Laterality: N/A;  1:30PM;ASA 3   COLONOSCOPY WITH PROPOFOL  N/A 08/25/2017   TI appeared normal. 5 mm polyp in rectum. Sessile. Redundant colon. Internal hemorrhoids. Hyperplastic.    ESOPHAGEAL DILATION N/A 02/25/2024   Procedure: DILATION, ESOPHAGUS;  Surgeon: Cindie Carlin POUR, DO;  [facility];  Service: Endoscopy;  Laterality: N/A;  1:30PM;ASA 3   ESOPHAGOGASTRODUODENOSCOPY N/A 02/25/2024   Procedure: EGD (ESOPHAGOGASTRODUODENOSCOPY);  Surgeon: Cindie Carlin POUR, DO;  [facility];  Service: Endoscopy;  Laterality: N/A;  1:30PM;ASA 3   ESOPHAGOGASTRODUODENOSCOPY (EGD)  WITH PROPOFOL  N/A 08/25/2017   benign-apearing esophageal stenosis, dilation, small hiatal hernia, mild gastritis due to NSAIDs. Negative H.pylori.    ESOPHAGOGASTRODUODENOSCOPY (EGD) WITH PROPOFOL  N/A 10/09/2020   Z-line irregular, benign-appearing esophageal stenosis, small hiatal hernia, gastritis, normal duodenum. +Barrett's esophagus, reactive gastropathy with erosions, negative celiac. Surveillance EGD in 2024.    FOOT SURGERY Right    x 2-bone spur   GASTROCNEMIUS RECESSION Right 12/18/2020   Procedure: RIGHT GASTROCNEMIUS RECSSION AND PLANTAR FASCIA RELEASE;  Surgeon: Harden Jerona GAILS, MD;  Location: [facility];  Service:  Orthopedics;  Laterality: Right;   HAND SURGERY Right    x 2-carpal tunnel   KNEE ARTHROSCOPY Left    KNEE ARTHROSCOPY WITH MEDIAL MENISECTOMY Left 05/07/2017   Procedure: KNEE ARTHROSCOPY WITH MEDIAL MENISECTOMY AND LATERAL MENISECTOMY, limited debriedment;  Surgeon: Margrette Taft BRAVO, MD;  [facility];  Service: Orthopedics;  Laterality: Left;   PARTIAL HYSTERECTOMY  2001   total abdominal   POLYPECTOMY  08/25/2017   Procedure: POLYPECTOMY;  Surgeon: Harvey Margo CROME, MD;  [facility];  Service: Endoscopy;;  rectal polyp cs   SAVORY DILATION N/A 08/25/2017   Procedure: SAVORY DILATION;  Surgeon: Harvey Margo CROME, MD;  [facility];  Service: Endoscopy;  Laterality: N/A;   TOTAL KNEE ARTHROPLASTY Left 09/24/2017   Procedure: TOTAL KNEE ARTHROPLASTY;  Surgeon: Margrette Taft BRAVO, MD;  [facility];  Service: Orthopedics;  Laterality: Left;   TOTAL KNEE ARTHROPLASTY Right 09/14/2023   Procedure: TOTAL KNEE ARTHROPLASTY;  Surgeon: Melodi Lerner, MD;  Location: [facility];  Service: Orthopedics;  Laterality: Right;   TOTAL KNEE REVISION Left 03/05/2022   Procedure: Left knee polyethylene revision;  Surgeon: Melodi Lerner, MD;  Location: [facility];  Service: Orthopedics;  Laterality: Left;   TRIGGER FINGER RELEASE Right 11/27/2016   Procedure: RELEASE TRIGGER FINGER/A-1 PULLEY RIGHT LONG FINGER;  Surgeon: Taft BRAVO Margrette, MD;  Location: [facility];  Service: Orthopedics;  Laterality: Right;  pt knows to arrive at 8:45    Family History  Problem Relation Age of Onset   Cancer Mother 51 - 44       breast   Colon cancer Mother 51   Heart disease Other    Arthritis Other    Cancer Other    Diabetes Other    Gastric cancer Neg Hx    Esophageal cancer Neg Hx      Social History   Tobacco Use  Smoking Status Never  Smokeless Tobacco Never    Social History   Substance and Sexual Activity  Alcohol Use No     Allergies  Allergen Reactions   Codeine Shortness  Of Breath and Nausea And Vomiting   Aspirin Nausea Only    Current Outpatient Medications  Medication Sig Dispense Refill   acetaminophen  (TYLENOL ) 500 MG tablet Take 500-1,000 mg by mouth every 6 (six) hours as needed for moderate pain (pain score 4-6).     albuterol (VENTOLIN HFA) 108 (90 Base) MCG/ACT inhaler Inhale 2 puffs into the lungs every 6 (six) hours as needed for wheezing or shortness of breath.     atorvastatin  (LIPITOR) 40 MG tablet Take 40 mg by mouth at bedtime.     Cholecalciferol (VITAMIN D-3 PO) Take 1 tablet by mouth every evening.     famotidine  (PEPCID ) 40 MG tablet Take 40 mg by mouth at bedtime.     fluticasone furoate-vilanterol (BREO ELLIPTA) 100-25 MCG/ACT AEPB Inhale 1 puff into the lungs daily.  glipiZIDE (GLUCOTROL) 5 MG tablet Take 5 mg by mouth 2 (two) times daily before a meal.     hydrochlorothiazide  (HYDRODIURIL ) 25 MG tablet Take 25 mg by mouth in the morning.  1   hydroquinone 4 % cream Apply 1 application  topically at bedtime.     ipratropium-albuterol (DUONEB) 0.5-2.5 (3) MG/3ML SOLN Take 3 mLs by nebulization every 6 (six) hours.     levocetirizine (XYZAL) 5 MG tablet Take 5 mg by mouth daily as needed for allergies. (Patient taking differently: Take 5 mg by mouth every evening.)     pantoprazole  (PROTONIX ) 40 MG tablet TAKE 1 TABLET BY MOUTH TWICE DAILY BEFORE MEALS 60 tablet 1   predniSONE  (DELTASONE ) 20 MG tablet Take 20 mg by mouth. Take 2 tablets (40 mg total) by mouth daily for 3 days, THEN 1 tablet (20 mg total) daily for 3 days, THEN 0.5 tablets (10 mg total) daily for 4 days.     traMADol  (ULTRAM ) 50 MG tablet Take 1-2 tablets (50-100 mg total) by mouth every 6 (six) hours as needed for moderate pain (pain score 4-6). 40 tablet 0   No current facility-administered medications for this visit.    Review of Systems  Constitutional:  Negative for malaise/fatigue.  Respiratory:  Positive for cough and shortness of breath.   Cardiovascular:   Positive for chest pain.  Gastrointestinal:  Positive for heartburn.  Neurological: Negative.      PHYSICAL EXAMINATION: BP 135/80 (BP Location: Left Arm, Patient Position: Sitting, Cuff Size: Large)   Pulse 74   Resp 20   Ht 5' 3 (1.6 m)   Wt 253 lb 14.4 oz (115.2 kg)   SpO2 97% Comment: RA  BMI 44.98 kg/m  Physical Exam Constitutional:      General: She is not in acute distress.    Appearance: She is not ill-appearing.  Eyes:     Extraocular Movements: Extraocular movements intact.  Cardiovascular:     Rate and Rhythm: Normal rate.  Musculoskeletal:        General: Normal range of motion.     Cervical back: Normal range of motion.  Skin:    General: Skin is warm and dry.  Neurological:     General: No focal deficit present.     Mental Status: She is alert and oriented to person, place, and time.     Diagnostic Studies & Laboratory data:    EGD: 2025 Esophageal mucosal changes secondary to established short- segment Barrett' s disease. Biopsied. - Mild Schatzki ring. Dilated. - Small hiatal hernia.      I have independently reviewed the above radiology studies  and reviewed the findings with the patient.   Recent Lab Findings: Lab Results  Component Value Date   WBC 4.5 04/15/2024   HGB 14.0 04/15/2024   HCT 42.3 04/15/2024   PLT 213 04/15/2024   GLUCOSE 86 04/15/2024   ALT 31 04/18/2021   AST 23 04/18/2021   NA 139 04/15/2024   K 3.9 04/15/2024   CL 103 04/15/2024   CREATININE 0.98 04/15/2024   BUN 10 04/15/2024   CO2 26 04/15/2024   INR 1.08 09/22/2017   HGBA1C 5.9 (H) 09/04/2023      Assessment / Plan:   63yo female with small hiatal hernia, dysphagia, short- segment Barrett' s disease, a mild Schatzki ring and shortness of breath.  She is also obese with a BMI of 45.  We discussed several options for treatment including a bariatric surgery along with  her hiatal hernia repair.  I advised this given her higher risk of recurrence, but she stated  that she did not want to delay things by seeing another surgeon.  She would like to expidite this given her symptoms and anxiety whenever she has chest pain.  In that regard, it may be due to her ring, which may still persists after the operation.  I explained this to her as well.  She stated that she would like to proceed with a hiatal hernia repair.  I will also perform an EGD and a Toupet fundoplication.  She will need an esophagram       I  spent 40 minutes with  the patient face to face and greater then 50% of the time was spent in counseling and coordination of care.    [name] 07/08/2024 10:40 AM

## 2024-07-06 NOTE — Telephone Encounter (Signed)
 Pt needs an ov

## 2024-07-07 ENCOUNTER — Other Ambulatory Visit: Payer: Self-pay

## 2024-07-07 NOTE — Transitions of Care (Post Inpatient/ED Visit) (Signed)
 Transition of Care week 3  Visit Note  07/07/2024  Name: Rhonda Patton MRN: [id number]          DOB: [birth year]  Situation: Patient enrolled in [facility] 30-day program. Visit completed with patient by telephone.   Background:   Initial Transition Care Management Follow-up Telephone Call    Past Medical History:  Diagnosis Date   Arthritis    Barrett's esophagus    Carpal tunnel syndrome of right wrist    Chronic pain    Diabetes (HCC)    GERD (gastroesophageal reflux disease)    Hypercholesteremia    Hypertension    PONV (postoperative nausea and vomiting)    Sleep apnea    diagnosed but doesn't use CPAP;     Assessment: Patient Reported Symptoms: Cognitive Cognitive Status: Alert and oriented to person, place, and time, Normal speech and language skills      Neurological Neurological Review of Symptoms: No symptoms reported    HEENT HEENT Symptoms Reported: No symptoms reported      Cardiovascular Cardiovascular Symptoms Reported: No symptoms reported    Respiratory Respiratory Symptoms Reported: Shortness of breath Other Respiratory Symptoms: shortness of breath with exertion Additional Respiratory Details: Pulmonary Referral 07-25-24 Respiratory Management Strategies: Medication therapy, Routine screening Respiratory Self-Management Outcome: 4 (good)  Endocrine Endocrine Symptoms Reported: No symptoms reported Is patient diabetic?: Yes Is patient checking blood sugars at home?: Yes List most recent blood sugar readings, include date and time of day: Blood sugar around 160 Endocrine Self-Management Outcome: 4 (good)  Gastrointestinal Gastrointestinal Symptoms Reported: No symptoms reported      Genitourinary Genitourinary Symptoms Reported: No symptoms reported    Integumentary Integumentary Symptoms Reported: No symptoms reported    Musculoskeletal Musculoskelatal Symptoms Reviewed: Weakness Additional Musculoskeletal Details: Encouraged to pace self  with activitiy Musculoskeletal Management Strategies: Exercise, Adequate rest, Routine screening, Activity Musculoskeletal Self-Management Outcome: 4 (good)      Psychosocial Psychosocial Symptoms Reported: No symptoms reported         There were no vitals filed for this visit.  Medications Reviewed Today     Reviewed by Temitope Griffing, RN (Case Manager) on 07/07/24 at 1106  Med List Status: <None>   Medication Order Taking? Sig Documenting Provider Last Dose Status Informant  acetaminophen  (TYLENOL ) 500 MG tablet [id number] Yes Take 500-1,000 mg by mouth every 6 (six) hours as needed for moderate pain (pain score 4-6). [provider]  Active Self  albuterol (VENTOLIN HFA) 108 (90 Base) MCG/ACT inhaler [id number] Yes Inhale 2 puffs into the lungs every 6 (six) hours as needed for wheezing or shortness of breath. [provider]  Active   atorvastatin  (LIPITOR) 40 MG tablet [id number]  Take 40 mg by mouth at bedtime.  Patient not taking: Reported on 07/07/2024   [provider]  Active Self  Cholecalciferol (VITAMIN D-3 PO) [id number] Yes Take 1 tablet by mouth every evening. [provider]  Active Self  famotidine  (PEPCID ) 40 MG tablet [id number] Yes Take 40 mg by mouth at bedtime. [provider]  Active Self  fluticasone furoate-vilanterol (BREO ELLIPTA) 100-25 MCG/ACT AEPB [id number]  Inhale 1 puff into the lungs daily.  Patient not taking: Reported on 07/07/2024   [provider]  Active   glipiZIDE (GLUCOTROL) 5 MG tablet [id number] Yes Take 5 mg by mouth 2 (two) times daily before a meal. [provider]  Active   hydrochlorothiazide  (HYDRODIURIL ) 25 MG tablet [id number] Yes Take 25 mg by mouth in the morning. [provider]  Active Self  HYDROcodone  bit-homatropine (HYCODAN) 5-1.5 MG/5ML syrup [id number] Yes Take 5 mLs by mouth every 6 (six) hours as needed for cough. [provider]  Active   HYDROmorphone   (DILAUDID ) 2 MG tablet [id number] Yes Take 1-2 tablets (2-4 mg total) by mouth every 6 (six) hours as needed for severe pain (pain score 7-10). Kristian Stabs, PA  Active   hydroquinone 4 % cream [id number]  Apply 1 application  topically at bedtime.  Patient not taking: Reported on 07/07/2024   [provider]  Active Self  ipratropium-albuterol (DUONEB) 0.5-2.5 (3) MG/3ML SOLN [id number] Yes Take 3 mLs by nebulization every 6 (six) hours. [provider]  Active   levocetirizine (XYZAL) 5 MG tablet [id number] Yes Take 5 mg by mouth daily as needed for allergies.  Patient taking differently: Take 5 mg by mouth every evening.   [provider]  Active Self  pantoprazole  (PROTONIX ) 40 MG tablet [id number] Yes TAKE 1 TABLET BY MOUTH TWICE DAILY BEFORE MEALS Shirlean Therisa ORN, NP  Active   traMADol  (ULTRAM ) 50 MG tablet [id number] Yes Take 1-2 tablets (50-100 mg total) by mouth every 6 (six) hours as needed for moderate pain (pain score 4-6). Kristian Stabs, PA  Active             Recommendation:   Continue Current Plan of Care  Follow Up Plan:   Telephone follow-up in 1 week  Leolia Vinzant J. Whitten Andreoni RN, MSN [facility], [facility] RN Care Manager Direct Dial: [phone]  Fax: [phone] Website: [email or web address]

## 2024-07-07 NOTE — Patient Instructions (Signed)
 Visit Information  Thank you for taking time to visit with me today. Please don't hesitate to contact me if I can be of assistance to you before our next scheduled telephone appointment.  Our next appointment is by telephone on 07/14/24 at 1100 am  Following is a copy of your care plan:   Goals Addressed             This Visit's Progress    VBCI Transitions of Care (TOC) Care Plan       Problems:  Recent Hospitalization for treatment of Asthma Knowledge Deficit Related to Asthma  Goal:  Over the next 30 days, the patient will not experience hospital readmission  Interventions:  Transitions of Care: Doctor Visits  - discussed the importance of doctor visits    Asthma: Provided instruction about proper use of medications used for management of Asthma including inhalers Provided education about and advised patient to utilize infection prevention strategies to reduce risk of respiratory infection Discussed the importance of adequate rest and management of fatigue with Asthma Advised when to seek Emergency care:  Symptoms do not improve after using the quick-relief inhaler as directed.  The patient is experiencing severe symptoms or difficulty breathing.  The patient needs to use their rescue inhaler frequently.   Patient Self Care Activities:  Attend all scheduled provider appointments Call pharmacy for medication refills 3-7 days in advance of running out of medications Call provider office for new concerns or questions  Notify RN Care Manager of TOC call rescheduling needs Participate in Transition of Care Program/Attend TOC scheduled calls Take medications as prescribed   Pace self with activities Stay away from things that trigger your asthma. Stay away from sick contacts.  Pulmonary Appointment 07/25/24  Plan:  The patient has been provided with contact information for the care management team and has been advised to call with any health related questions or concerns.          Patient verbalizes understanding of instructions and care plan provided today and agrees to view in MyChart. Active MyChart status and patient understanding of how to access instructions and care plan via MyChart confirmed with patient.     The patient has been provided with contact information for the care management team and has been advised to call with any health related questions or concerns.   Please call the care guide team at [phone] if you need to cancel or reschedule your appointment.   Please call the Suicide and Crisis Lifeline: 988 if you are experiencing a Mental Health or Behavioral Health Crisis or need someone to talk to.  Salah Burlison J. Jackline Castilla RN, MSN [facility], [facility] RN Care Manager Direct Dial: [phone]  Fax: [phone] Website: [email or web address]

## 2024-07-08 ENCOUNTER — Encounter: Payer: Self-pay | Admitting: Thoracic Surgery (Cardiothoracic Vascular Surgery)

## 2024-07-08 ENCOUNTER — Encounter: Payer: Self-pay | Admitting: *Deleted

## 2024-07-08 ENCOUNTER — Other Ambulatory Visit: Payer: Self-pay

## 2024-07-08 ENCOUNTER — Other Ambulatory Visit: Payer: Self-pay | Admitting: *Deleted

## 2024-07-08 ENCOUNTER — Ambulatory Visit
Attending: Thoracic Surgery (Cardiothoracic Vascular Surgery) | Admitting: Thoracic Surgery (Cardiothoracic Vascular Surgery)

## 2024-07-08 VITALS — BP 135/80 | HR 74 | Resp 20 | Ht 63.0 in | Wt 253.9 lb

## 2024-07-08 DIAGNOSIS — K449 Diaphragmatic hernia without obstruction or gangrene: Secondary | ICD-10-CM

## 2024-07-13 ENCOUNTER — Ambulatory Visit (HOSPITAL_COMMUNITY)
Admission: RE | Admit: 2024-07-13 | Discharge: 2024-07-13 | Disposition: A | Discharge status: Home / Self Care | Source: Ambulatory Visit | Attending: Thoracic Surgery (Cardiothoracic Vascular Surgery) | Admitting: Thoracic Surgery (Cardiothoracic Vascular Surgery)

## 2024-07-13 ENCOUNTER — Other Ambulatory Visit: Payer: Self-pay | Admitting: Thoracic Surgery (Cardiothoracic Vascular Surgery)

## 2024-07-13 DIAGNOSIS — R1310 Dysphagia, unspecified: Secondary | ICD-10-CM | POA: Diagnosis not present

## 2024-07-13 DIAGNOSIS — K449 Diaphragmatic hernia without obstruction or gangrene: Secondary | ICD-10-CM | POA: Insufficient documentation

## 2024-07-14 ENCOUNTER — Other Ambulatory Visit: Payer: Self-pay

## 2024-07-14 NOTE — Transitions of Care (Post Inpatient/ED Visit) (Signed)
 Transition of Care week 4  Visit Note  07/14/2024  Name: Rhonda Patton MRN: [id number]          DOB: [birth year]  Situation: Patient enrolled in [facility] 30-day program. Visit completed with patient by telephone.   Background:   Initial Transition Care Management Follow-up Telephone Call    Past Medical History:  Diagnosis Date   Arthritis    Barrett's esophagus    Carpal tunnel syndrome of right wrist    Chronic pain    Diabetes (HCC)    GERD (gastroesophageal reflux disease)    Hypercholesteremia    Hypertension    PONV (postoperative nausea and vomiting)    Sleep apnea    diagnosed but doesn't use CPAP;     Assessment: Patient Reported Symptoms: Cognitive Cognitive Status: Alert and oriented to person, place, and time, Normal speech and language skills      Neurological Neurological Review of Symptoms: No symptoms reported    HEENT HEENT Symptoms Reported: No symptoms reported      Cardiovascular Cardiovascular Symptoms Reported: No symptoms reported    Respiratory Respiratory Symptoms Reported: Shortness of breath Other Respiratory Symptoms: Shortness of breath with exertion.  Uses rescue inhlaer as needed Additional Respiratory Details: Pulmonary Referral 07-25-24 Respiratory Management Strategies: Medication therapy, Routine screening  Endocrine Endocrine Symptoms Reported: No symptoms reported Is patient diabetic?: Yes Is patient checking blood sugars at home?: Yes List most recent blood sugar readings, include date and time of day: Blood sugar around 140 Endocrine Self-Management Outcome: 4 (good)  Gastrointestinal Gastrointestinal Symptoms Reported: No symptoms reported      Genitourinary Genitourinary Symptoms Reported: No symptoms reported    Integumentary Integumentary Symptoms Reported: No symptoms reported    Musculoskeletal Musculoskelatal Symptoms Reviewed: Weakness Additional Musculoskeletal Details: Pace self with activity Musculoskeletal  Management Strategies: Exercise, Adequate rest, Routine screening, Activity Musculoskeletal Self-Management Outcome: 4 (good)      Psychosocial Psychosocial Symptoms Reported: No symptoms reported         There were no vitals filed for this visit.  Medications Reviewed Today     Reviewed by Tiena Manansala, RN (Case Manager) on 07/14/24 at 1107  Med List Status: <None>   Medication Order Taking? Sig Documenting Provider Last Dose Status Informant  acetaminophen  (TYLENOL ) 500 MG tablet [id number] Yes Take 500-1,000 mg by mouth every 6 (six) hours as needed for moderate pain (pain score 4-6). [provider]  Active Self  albuterol (VENTOLIN HFA) 108 (90 Base) MCG/ACT inhaler [id number] Yes Inhale 2 puffs into the lungs every 6 (six) hours as needed for wheezing or shortness of breath. [provider]  Active   atorvastatin  (LIPITOR) 40 MG tablet [id number] Yes Take 40 mg by mouth at bedtime. [provider]  Active Self  Cholecalciferol (VITAMIN D-3 PO) [id number] Yes Take 1 tablet by mouth every evening. [provider]  Active Self  famotidine  (PEPCID ) 40 MG tablet [id number] Yes Take 40 mg by mouth at bedtime. [provider]  Active Self  fluticasone furoate-vilanterol (BREO ELLIPTA) 100-25 MCG/ACT AEPB [id number]  Inhale 1 puff into the lungs daily. [provider]  Active   glipiZIDE (GLUCOTROL) 5 MG tablet [id number] Yes Take 5 mg by mouth 2 (two) times daily before a meal. [provider]  Active   hydrochlorothiazide  (HYDRODIURIL ) 25 MG tablet [id number] Yes Take 25 mg by mouth in the morning. [provider]  Active Self  hydroquinone 4 % cream [id number] Yes Apply 1 application  topically at bedtime.  [provider]  Active Self  ipratropium-albuterol (DUONEB) 0.5-2.5 (3) MG/3ML SOLN [id number] Yes Take 3 mLs by nebulization every 6 (six) hours. [provider]  Active   levocetirizine (XYZAL) 5 MG  tablet [id number] Yes Take 5 mg by mouth daily as needed for allergies.  Patient taking differently: Take 5 mg by mouth every evening.   [provider]  Active Self  pantoprazole  (PROTONIX ) 40 MG tablet [id number] Yes TAKE 1 TABLET BY MOUTH TWICE DAILY BEFORE MEALS Shirlean Therisa ORN, NP  Active   traMADol  (ULTRAM ) 50 MG tablet [id number] Yes Take 1-2 tablets (50-100 mg total) by mouth every 6 (six) hours as needed for moderate pain (pain score 4-6). Kristian Stabs, PA  Active             Goals Addressed             This Visit's Progress    VBCI Transitions of Care (TOC) Care Plan       Problems:  Recent Hospitalization for treatment of Asthma Knowledge Deficit Related to Asthma  Goal:  Over the next 30 days, the patient will not experience hospital readmission  Interventions:  Transitions of Care: Doctor Visits  - discussed the importance of doctor visits    Asthma: Provided instruction about proper use of medications used for management of Asthma including inhalers Provided education about and advised patient to utilize infection prevention strategies to reduce risk of respiratory infection Discussed the importance of adequate rest and management of fatigue with Asthma Advised when to seek Emergency care:  Symptoms do not improve after using the quick-relief inhaler as directed.  The patient is experiencing severe symptoms or difficulty breathing.  The patient needs to use their rescue inhaler frequently.   Patient Self Care Activities:  Attend all scheduled provider appointments Call pharmacy for medication refills 3-7 days in advance of running out of medications Call provider office for new concerns or questions  Notify RN Care Manager of TOC call rescheduling needs Participate in Transition of Care Program/Attend TOC scheduled calls Take medications as prescribed   Pace self with activities Stay away from things that trigger your asthma. Stay away from sick  contacts.    Plan:  The patient has been provided with contact information for the care management team and has been advised to call with any health related questions or concerns.         Recommendation:   Continue Current Plan of Care  Follow Up Plan:   Telephone follow-up in 1 week  Aziah Brostrom J. Alanna Storti RN, MSN [facility], [facility] RN Care Manager Direct Dial: [phone]  Fax: [phone] Website: [email or web address]

## 2024-07-14 NOTE — Patient Instructions (Signed)
 Visit Information  Thank you for taking time to visit with me today. Please don't hesitate to contact me if I can be of assistance to you before our next scheduled telephone appointment.  Our next appointment is by telephone on 07/21/24 at 1100 am  Following is a copy of your care plan:   Goals Addressed             This Visit's Progress    VBCI Transitions of Care (TOC) Care Plan       Problems:  Recent Hospitalization for treatment of Asthma Knowledge Deficit Related to Asthma  Goal:  Over the next 30 days, the patient will not experience hospital readmission  Interventions:  Transitions of Care: Doctor Visits  - discussed the importance of doctor visits    Asthma: Provided instruction about proper use of medications used for management of Asthma including inhalers Provided education about and advised patient to utilize infection prevention strategies to reduce risk of respiratory infection Discussed the importance of adequate rest and management of fatigue with Asthma Advised when to seek Emergency care:  Symptoms do not improve after using the quick-relief inhaler as directed.  The patient is experiencing severe symptoms or difficulty breathing.  The patient needs to use their rescue inhaler frequently.   Patient Self Care Activities:  Attend all scheduled provider appointments Call pharmacy for medication refills 3-7 days in advance of running out of medications Call provider office for new concerns or questions  Notify RN Care Manager of TOC call rescheduling needs Participate in Transition of Care Program/Attend TOC scheduled calls Take medications as prescribed   Pace self with activities Stay away from things that trigger your asthma. Stay away from sick contacts.    Plan:  The patient has been provided with contact information for the care management team and has been advised to call with any health related questions or concerns.         Patient  verbalizes understanding of instructions and care plan provided today and agrees to view in MyChart. Active MyChart status and patient understanding of how to access instructions and care plan via MyChart confirmed with patient.     The patient has been provided with contact information for the care management team and has been advised to call with any health related questions or concerns.   Please call the care guide team at [phone] if you need to cancel or reschedule your appointment.   Please call the Suicide and Crisis Lifeline: 988 call the [place]  National Suicide Prevention Lifeline: 564-623-3601 or TTY: 548-589-2357 TTY 573-776-4926) to talk to a trained counselor if you are experiencing a Mental Health or Behavioral Health Crisis or need someone to talk to.  Lacy Sofia J. Chadrick Sprinkle RN, MSN [facility], [facility] RN Care Manager Direct Dial: [phone]  Fax: [phone] Website: [email or web address]

## 2024-07-20 DIAGNOSIS — E559 Vitamin D deficiency, unspecified: Secondary | ICD-10-CM | POA: Diagnosis not present

## 2024-07-20 DIAGNOSIS — E1165 Type 2 diabetes mellitus with hyperglycemia: Secondary | ICD-10-CM | POA: Diagnosis not present

## 2024-07-21 ENCOUNTER — Other Ambulatory Visit: Payer: Self-pay

## 2024-07-21 DIAGNOSIS — J159 Unspecified bacterial pneumonia: Secondary | ICD-10-CM | POA: Diagnosis not present

## 2024-07-21 NOTE — Transitions of Care (Post Inpatient/ED Visit) (Signed)
 Transition of Care Week 5  Visit Note  07/21/2024  Name: Rhonda Patton MRN: [id number]          DOB: April 25, 1961  Situation: Patient enrolled in [facility] 30-day program. Visit completed with patient by telephone.   Background:   Initial Transition Care Management Follow-up Telephone Call    Past Medical History:  Diagnosis Date   Arthritis    Barrett's esophagus    Carpal tunnel syndrome of right wrist    Chronic pain    Diabetes (HCC)    GERD (gastroesophageal reflux disease)    Hypercholesteremia    Hypertension    PONV (postoperative nausea and vomiting)    Sleep apnea    diagnosed but doesn't use CPAP;     Assessment: Patient Reported Symptoms: Cognitive Cognitive Status: Alert and oriented to person, place, and time, Normal speech and language skills      Neurological Neurological Review of Symptoms: No symptoms reported    HEENT HEENT Symptoms Reported: No symptoms reported      Cardiovascular Cardiovascular Symptoms Reported: No symptoms reported    Respiratory Respiratory Symptoms Reported: Shortness of breath Other Respiratory Symptoms: Shorntess of breath with exertion.  PRN rescue inhaler Additional Respiratory Details: Pulmonary referral 07/25/24 Respiratory Management Strategies: Medication therapy, Routine screening  Endocrine Endocrine Symptoms Reported: No symptoms reported Is patient diabetic?: Yes Is patient checking blood sugars at home?: Yes List most recent blood sugar readings, include date and time of day: Sugars continue around 140    Gastrointestinal Gastrointestinal Symptoms Reported: No symptoms reported Gastrointestinal Comment: Hernia surgery possible 10-9    Genitourinary Genitourinary Symptoms Reported: No symptoms reported    Integumentary Integumentary Symptoms Reported: No symptoms reported    Musculoskeletal Musculoskelatal Symptoms Reviewed: Weakness Additional Musculoskeletal Details: Continues to pace self with  activitiy. Musculoskeletal Management Strategies: Adequate rest, Exercise, Routine screening Musculoskeletal Self-Management Outcome: 4 (good)      Psychosocial Psychosocial Symptoms Reported: No symptoms reported         There were no vitals filed for this visit.  Medications Reviewed Today     Reviewed by Sherian Valenza, RN (Case Manager) on 07/21/24 at 1105  Med List Status: <None>   Medication Order Taking? Sig Documenting Provider Last Dose Status Informant  acetaminophen  (TYLENOL ) 500 MG tablet [id number] Yes Take 500-1,000 mg by mouth every 6 (six) hours as needed for moderate pain (pain score 4-6). [provider]  Active Self  albuterol (VENTOLIN HFA) 108 (90 Base) MCG/ACT inhaler [id number] Yes Inhale 2 puffs into the lungs every 6 (six) hours as needed for wheezing or shortness of breath. [provider]  Active   atorvastatin  (LIPITOR) 40 MG tablet [id number] Yes Take 40 mg by mouth at bedtime. [provider]  Active Self  Cholecalciferol (VITAMIN D-3 PO) [id number] Yes Take 1 tablet by mouth every evening. [provider]  Active Self  famotidine  (PEPCID ) 40 MG tablet [id number] Yes Take 40 mg by mouth at bedtime. [provider]  Active Self  fluticasone furoate-vilanterol (BREO ELLIPTA) 100-25 MCG/ACT AEPB [id number] Yes Inhale 1 puff into the lungs daily. [provider]  Active   glipiZIDE (GLUCOTROL) 5 MG tablet [id number] Yes Take 5 mg by mouth 2 (two) times daily before a meal. [provider]  Active   hydrochlorothiazide  (HYDRODIURIL ) 25 MG tablet [id number] Yes Take 25 mg by mouth in the morning. [provider]  Active Self  hydroquinone 4 % cream [id number] Yes Apply 1 application  topically at bedtime.  [provider]  Active Self  ipratropium-albuterol (DUONEB) 0.5-2.5 (3) MG/3ML SOLN [id number] Yes Take 3 mLs by nebulization every 6 (six) hours. [provider]  Active    levocetirizine (XYZAL) 5 MG tablet [id number]  Take 5 mg by mouth daily as needed for allergies.  Patient taking differently: Take 5 mg by mouth every evening.   [provider]  Active Self  pantoprazole  (PROTONIX ) 40 MG tablet [id number] Yes TAKE 1 TABLET BY MOUTH TWICE DAILY BEFORE MEALS Shirlean Therisa ORN, NP  Active   traMADol  (ULTRAM ) 50 MG tablet [id number] Yes Take 1-2 tablets (50-100 mg total) by mouth every 6 (six) hours as needed for moderate pain (pain score 4-6). Kristian Stabs, PA  Active             Goals Addressed             This Visit's Progress    COMPLETED: VBCI Transitions of Care (TOC) Care Plan       Problems:  Recent Hospitalization for treatment of Asthma Knowledge Deficit Related to Asthma  Goal:  Over the next 30 days, the patient will not experience hospital readmission  Interventions:  Transitions of Care: Doctor Visits  - discussed the importance of doctor visits    Asthma: Provided instruction about proper use of medications used for management of Asthma including inhalers Provided education about and advised patient to utilize infection prevention strategies to reduce risk of respiratory infection Discussed the importance of adequate rest and management of fatigue with Asthma Advised when to seek Emergency care:  Symptoms do not improve after using the quick-relief inhaler as directed.  The patient is experiencing severe symptoms or difficulty breathing.  The patient needs to use their rescue inhaler frequently.   Patient Self Care Activities:  Attend all scheduled provider appointments Call pharmacy for medication refills 3-7 days in advance of running out of medications Call provider office for new concerns or questions  Notify RN Care Manager of TOC call rescheduling needs Participate in Transition of Care Program/Attend TOC scheduled calls Take medications as prescribed   Pace self with activities Stay away from things that  trigger your asthma. Stay away from sick contacts.    Plan:  The patient has been provided with contact information for the care management team and has been advised to call with any health related questions or concerns.        Recommendation:   Continue Current Plan of Care Follow up with PCP for questions or concerns. Follow Up Plan:   Closing From:  Transitions of Care Program  Alyse Kathan DOROTHA Seeds RN, MSN [facility], [facility] RN Care Manager Direct Dial: [phone]  Fax: [phone] Website: [email or web address]

## 2024-07-21 NOTE — Patient Instructions (Signed)
 Visit Information  Thank you for taking time to visit with me today.     Following is a copy of your care plan:   Goals Addressed             This Visit's Progress    COMPLETED: [facility] Transitions of Care (TOC) Care Plan       Problems:  Recent Hospitalization for treatment of Asthma Knowledge Deficit Related to Asthma  Goal:  Over the next 30 days, the patient will not experience hospital readmission  Interventions:  Transitions of Care: Doctor Visits  - discussed the importance of doctor visits    Asthma: Provided instruction about proper use of medications used for management of Asthma including inhalers Provided education about and advised patient to utilize infection prevention strategies to reduce risk of respiratory infection Discussed the importance of adequate rest and management of fatigue with Asthma Advised when to seek Emergency care:  Symptoms do not improve after using the quick-relief inhaler as directed.  The patient is experiencing severe symptoms or difficulty breathing.  The patient needs to use their rescue inhaler frequently.   Patient Self Care Activities:  Attend all scheduled provider appointments Call pharmacy for medication refills 3-7 days in advance of running out of medications Call provider office for new concerns or questions  Notify RN Care Manager of TOC call rescheduling needs Participate in Transition of Care Program/Attend TOC scheduled calls Take medications as prescribed   Pace self with activities Stay away from things that trigger your asthma. Stay away from sick contacts.    Plan:  The patient has been provided with contact information for the care management team and has been advised to call with any health related questions or concerns.         Patient verbalizes understanding of instructions and care plan provided today and agrees to view in MyChart. Active MyChart status and patient understanding of how to access  instructions and care plan via MyChart confirmed with patient.     The patient has been provided with contact information for the care management team and has been advised to call with any health related questions or concerns.   Please call the care guide team at [phone] if you need to cancel or reschedule your appointment.   Please call the Suicide and Crisis Lifeline: 988 if you are experiencing a Mental Health or Behavioral Health Crisis or need someone to talk to.  Colvin Blatt J. Thurmond Hildebran RN, MSN [facility] RN Care Manager Direct Dial: [phone]  Fax: [phone] Website: [email or web address]

## 2024-07-25 ENCOUNTER — Encounter: Payer: Self-pay | Admitting: Internal Medicine

## 2024-07-25 ENCOUNTER — Ambulatory Visit: Admitting: Internal Medicine

## 2024-07-25 VITALS — BP 117/72 | HR 73 | Ht 63.0 in | Wt 253.4 lb

## 2024-07-25 DIAGNOSIS — Z6841 Body Mass Index (BMI) 40.0 and over, adult: Secondary | ICD-10-CM

## 2024-07-25 DIAGNOSIS — R0609 Other forms of dyspnea: Secondary | ICD-10-CM | POA: Diagnosis not present

## 2024-07-25 DIAGNOSIS — J4530 Mild persistent asthma, uncomplicated: Secondary | ICD-10-CM | POA: Diagnosis not present

## 2024-07-25 NOTE — Progress Notes (Signed)
 Surgical Instructions   Your procedure is scheduled on Thursday, October 9th. Report to [facility] Main Entrance A at 5:30 A.M., then check in with the Admitting office. Any questions or running late day of surgery: call [phone]  Questions prior to your surgery date: call [phone], Monday-Friday, 8am-4pm. If you experience any cold or flu symptoms such as cough, fever, chills, shortness of breath, etc. between now and your scheduled surgery, please notify us  at the above number.     Remember:  Do not eat or drink after midnight the night before your surgery    Take these medicines the morning of surgery with A SIP OF WATER   budesonide-formoterol (SYMBICORT) inhaler  fluticasone furoate-vilanterol (BREO ELLIPTA) inhaler  pantoprazole  (PROTONIX )    May take these medicines IF NEEDED: acetaminophen  (TYLENOL )  Albuterol-Budesonide (AIRSUPRA) inhaler    One week prior to surgery, STOP taking any Aspirin (unless otherwise instructed by your surgeon) Aleve, Naproxen, Ibuprofen , Motrin , Advil , Goody's, BC's, all herbal medications, fish oil, and non-prescription vitamins.  WHAT DO I DO ABOUT MY DIABETES MEDICATION?   Do not take oral diabetes medicines glipiZIDE (GLUCOTROL) the morning of surgery. Last dose should be taken Wednesday, October 8th in the morning.       HOW TO MANAGE YOUR DIABETES BEFORE AND AFTER SURGERY  Why is it important to control my blood sugar before and after surgery? Improving blood sugar levels before and after surgery helps healing and can limit problems. A way of improving blood sugar control is eating a healthy diet by:  Eating less sugar and carbohydrates  Increasing activity/exercise  Talking with your doctor about reaching your blood sugar goals High blood sugars (greater than 180 mg/dL) can raise your risk of infections and slow your recovery, so you will need to focus on controlling your diabetes during the weeks before surgery. Make  sure that the doctor who takes care of your diabetes knows about your planned surgery including the date and location.  How do I manage my blood sugar before surgery? Check your blood sugar at least 4 times a day, starting 2 days before surgery, to make sure that the level is not too high or low.  Check your blood sugar the morning of your surgery when you wake up and every 2 hours until you get to the Short Stay unit.  If your blood sugar is less than 70 mg/dL, you will need to treat for low blood sugar: Do not take insulin . Treat a low blood sugar (less than 70 mg/dL) with  cup of clear juice (cranberry or apple), 4 glucose tablets, OR glucose gel. Recheck blood sugar in 15 minutes after treatment (to make sure it is greater than 70 mg/dL). If your blood sugar is not greater than 70 mg/dL on recheck, call [phone] for further instructions. Report your blood sugar to the short stay nurse when you get to Short Stay.  If you are admitted to the hospital after surgery: Your blood sugar will be checked by the staff and you will probably be given insulin  after surgery (instead of oral diabetes medicines) to make sure you have good blood sugar levels. The goal for blood sugar control after surgery is 80-180 mg/dL.                      Do NOT Smoke (Tobacco/Vaping) for 24 hours prior to your procedure.  If you use a CPAP at night, you may bring your mask/headgear for your overnight stay.  You will be asked to remove any contacts, glasses, piercing's, hearing aid's, dentures/partials prior to surgery. Please bring cases for these items if needed.    Patients discharged the day of surgery will not be allowed to drive home, and someone needs to stay with them for 24 hours.  SURGICAL WAITING ROOM VISITATION Patients may have no more than 2 support people in the waiting area - these visitors may rotate.   Pre-op nurse will coordinate an appropriate time for 1 ADULT support person, who may not  rotate, to accompany patient in pre-op.  Children under the age of 43 must have an adult with them who is not the patient and must remain in the main waiting area with an adult.  If the patient needs to stay at the hospital during part of their recovery, the visitor guidelines for inpatient rooms apply.  Please refer to the [facility] website for the visitor guidelines for any additional information.   If you received a COVID test during your pre-op visit  it is requested that you wear a mask when out in public, stay away from anyone that may not be feeling well and notify your surgeon if you develop symptoms. If you have been in contact with anyone that has tested positive in the last 10 days please notify you surgeon.      Pre-operative CHG Bathing Instructions   You can play a key role in reducing the risk of infection after surgery. Your skin needs to be as free of germs as possible. You can reduce the number of germs on your skin by washing with CHG (chlorhexidine  gluconate) soap before surgery. CHG is an antiseptic soap that kills germs and continues to kill germs even after washing.   DO NOT use if you have an allergy to chlorhexidine /CHG or antibacterial soaps. If your skin becomes reddened or irritated, stop using the CHG and notify one of our RNs at [phone].              TAKE A SHOWER THE NIGHT BEFORE SURGERY   Please keep in mind the following:  DO NOT shave, including legs and underarms, 48 hours prior to surgery.   You may shave your face before/day of surgery.  Place clean sheets on your bed the night before surgery Use a clean washcloth (not used since being washed) for shower. DO NOT sleep with pet's night before surgery.  CHG Shower Instructions:  Wash your face and private area with normal soap. If you choose to wash your hair, wash first with your normal shampoo.  After you use shampoo/soap, rinse your hair and body thoroughly to remove shampoo/soap residue.   Turn the water  OFF and apply half the bottle of CHG soap to a CLEAN washcloth.  Apply CHG soap ONLY FROM YOUR NECK DOWN TO YOUR TOES (washing for 3-5 minutes)  DO NOT use CHG soap on face, private areas, open wounds, or sores.  Pay special attention to the area where your surgery is being performed.  If you are having back surgery, having someone wash your back for you may be helpful. Wait 2 minutes after CHG soap is applied, then you may rinse off the CHG soap.  Pat dry with a clean towel  Put on clean pajamas    Additional instructions for the day of surgery: If you choose, you may shower the morning of surgery with an antibacterial soap.  DO NOT APPLY any lotions, deodorants, cologne, or perfumes.   Do not  wear jewelry or makeup Do not wear nail polish, gel polish, artificial nails, or any other type of covering on natural nails (fingers and toes) Do not bring valuables to the hospital. [facility] is not responsible for valuables/personal belongings. Put on clean/comfortable clothes.  Please brush your teeth.  Ask your nurse before applying any prescription medications to the skin.

## 2024-07-25 NOTE — Assessment & Plan Note (Addendum)
 Onset 2024  - wt in the 1980s p last baby = 150 - 07/25/2024   Walked on RA  x  3  lap(s) =  approx 450  ft  @ mod  pace, stopped due to end of study  with lowest 02 sats 97% with sob at end but no cp  - PFT's should help sort out restrictive component from obesity vs obst component from unresolved asthmatic component  - Doe labs ok x for d dimer= high normal value (seen commonly in the elderly or chronically ill)  may miss small peripheral pe, the clot burden with sob is moderately high and the d dimer  has a very high neg pred value if used in this setting.

## 2024-07-25 NOTE — Assessment & Plan Note (Addendum)
 Onset around 2024  - 07/25/2024  After extensive coaching inhaler device,  effectiveness =    80% (short Ti)  Allergy screen 07/25/2024 >  Eos 0.2 /  IgE 74   I think her asthma is relatively mild but does report improvement in cough and doe on low dose symbicort so should continue it plus approp saba:  Re SABA :  I spent extra time with pt today reviewing appropriate use of albuterol for prn use on exertion with the following points: 1) saba is for relief of sob that does not improve by walking a slower pace or resting but rather if the pt does not improve after trying this first. 2) If the pt is convinced, as many are, that saba helps recover from activity faster then it's easy to tell if this is the case by re-challenging : ie stop, take the inhaler, then p 5 minutes try the exact same activity (intensity of workload) that just caused the symptoms and see if they are substantially diminished or not after saba 3) if there is an activity that reproducibly causes the symptoms, try the saba 15 min before the activity on alternate days   If in fact the saba really does help, then fine to continue to use it prn but advised may need to look closer at the maintenance regimen being used to achieve better control of airways disease with exertion.

## 2024-07-25 NOTE — Progress Notes (Signed)
 Rhonda Patton, female    DOB: [birth year]    MRN: [id number]   Brief patient profile:  33  yobf  never smoker  referred to pulmonary clinic in [place]  07/25/2024 by Carliss Batty  for doe    Baseline wt 150 p last baby 1983    Sleep study done by Dr Shellia 2024 (nl study)  but not an established pt    History of Present Illness  07/25/2024  Pulmonary/ 1st office eval/ [place] / [place] [facility]  Chief Complaint  Patient presents with   Establish Care   Asthma    shob   Dyspnea:  onset was in 2024 and has improved on symbiocrt/air supra  Still able to walk at [employer] slow pace/ pushing cart 1-2 aisles  uses [facility] parking    Cough: better now  Sleep: bed is flat with a lot of pillows under head= baseline  SABA use: once a day  02: none  Has active HB with plans on 07/28/24 for [facility] repair    No obvious day to day or daytime pattern/variability or assoc excess/ purulent sputum or mucus plugs or hemoptysis or cp or chest tightness, subjective wheeze or overt sinus symptoms.    Also denies any obvious fluctuation of symptoms with weather or environmental changes or other aggravating or alleviating factors except as outlined above   No unusual exposure hx or h/o childhood pna/ asthma or knowledge of premature birth.  Current Allergies, Complete Past Medical History, Past Surgical History, Family History, and Social History were reviewed in [employer] record.  ROS  The following are not active complaints unless bolded Hoarseness, sore throat, dysphagia, dental problems, itching, sneezing,  nasal congestion or discharge of excess mucus or purulent secretions, ear ache,   fever, chills, sweats, unintended wt loss or wt gain, classically pleuritic or exertional cp,  orthopnea pnd or arm/hand swelling  or leg swelling, presyncope, palpitations, abdominal pain, anorexia, nausea, vomiting, diarrhea  or change in bowel habits or change in bladder habits, change in  stools or change in urine, dysuria, hematuria,  rash, arthralgias, visual complaints, headache, numbness, weakness or ataxia or problems with walking or coordination,  change in mood or  memory.            Outpatient Medications Prior to Visit  Medication Sig Dispense Refill   acetaminophen  (TYLENOL ) 500 MG tablet Take 500-1,000 mg by mouth every 6 (six) hours as needed for moderate pain (pain score 4-6).     Albuterol-Budesonide (AIRSUPRA) 90-80 MCG/ACT AERO Inhale 1-2 puffs into the lungs every 6 (six) hours as needed (wheezing/shortness of breath).     atorvastatin  (LIPITOR) 20 MG tablet Take 20 mg by mouth at bedtime.     budesonide-formoterol (SYMBICORT) 80-4.5 MCG/ACT inhaler Inhale 1 puff into the lungs 2 (two) times daily.     Cholecalciferol (VITAMIN D-3 PO) Take 1 tablet by mouth every evening.     famotidine  (PEPCID ) 40 MG tablet Take 40 mg by mouth at bedtime.     fluticasone furoate-vilanterol (BREO ELLIPTA) 100-25 MCG/ACT AEPB Inhale 1 puff into the lungs daily.     glipiZIDE (GLUCOTROL) 5 MG tablet Take 5 mg by mouth 2 (two) times daily before a meal.     hydrochlorothiazide  (HYDRODIURIL ) 25 MG tablet Take 25 mg by mouth in the morning.  1   hydroquinone 4 % cream Apply 1 application  topically every other day.     pantoprazole  (PROTONIX ) 40 MG tablet TAKE  1 TABLET BY MOUTH TWICE DAILY BEFORE MEALS 60 tablet 1   No facility-administered medications prior to visit.    Past Medical History:  Diagnosis Date   Arthritis    Barrett's esophagus    Carpal tunnel syndrome of right wrist    Chronic pain    Diabetes (HCC)    GERD (gastroesophageal reflux disease)    Hypercholesteremia    Hypertension    PONV (postoperative nausea and vomiting)    Sleep apnea    diagnosed but doesn't use CPAP;       Objective:     BP 117/72   Pulse 73   Ht 5' 3 (1.6 m)   Wt 253 lb 6.4 oz (114.9 kg)   SpO2 99% Comment: ra  BMI 44.89 kg/m   SpO2: 99 % (ra)  very pleasant MO (by  BMI) amb bf nad    HEENT : Oropharynx  clear    -  Nasal turbinates nl    NECK :  without  apparent JVD/ palpable Nodes/TM    LUNGS: no acc muscle use,  Nl contour chest which is clear to A and P bilaterally without cough on insp or exp maneuvers   CV:  RRR  no s3 or murmur or increase in P2, and no edema   ABD:  obese soft and nontender   MS:  Gait nl   ext warm without deformities Or obvious joint restrictions  calf tenderness, cyanosis or clubbing    SKIN: warm and dry without lesions    NEURO:  alert, approp, nl sensorium with  no motor or cerebellar deficits apparent.    I personally reviewed images and agree with radiology impression as follows:   Chest CTa    04/15/24  1. No pulmonary embolus or acute intrathoracic abnormality. 2. Sequela of prior granulomatous disease. 3. Small hiatal hernia.     Labs ordered/ reviewed:      Chemistry      Component Value Date/Time   NA 141 07/26/2024 1308   K 3.6 07/26/2024 1308   CL 106 07/26/2024 1308   CO2 25 07/26/2024 1308   BUN 11 07/26/2024 1308   CREATININE 1.11 (H) 07/26/2024 1308   CREATININE 1.03 (H) 11/14/2020 1458      Component Value Date/Time   CALCIUM  9.3 07/26/2024 1308   ALKPHOS 66 07/26/2024 1308   AST 27 07/26/2024 1308   ALT 25 07/26/2024 1308   BILITOT 0.7 07/26/2024 1308        Lab Results  Component Value Date   WBC 4.3 07/26/2024   HGB 13.5 07/26/2024   HCT 41.7 07/26/2024   MCV 87.2 07/26/2024   PLT 209 07/26/2024     Lab Results  Component Value Date   DDIMER 1.14 (H) 07/25/2024      Lab Results  Component Value Date   TSH 0.922 07/25/2024            Assessment   Assessment & Plan Chronic asthma, mild persistent, uncomplicated Onset around 2024  - 07/25/2024  After extensive coaching inhaler device,  effectiveness =    80% (short Ti)  Allergy screen 07/25/2024 >  Eos 0.2 /  IgE 74   I think her asthma is relatively mild but does report improvement in cough and doe on  low dose symbicort so should continue it plus approp saba:  Re SABA :  I spent extra time with pt today reviewing appropriate use of albuterol for prn use on exertion with the  following points: 1) saba is for relief of sob that does not improve by walking a slower pace or resting but rather if the pt does not improve after trying this first. 2) If the pt is convinced, as many are, that saba helps recover from activity faster then it's easy to tell if this is the case by re-challenging : ie stop, take the inhaler, then p 5 minutes try the exact same activity (intensity of workload) that just caused the symptoms and see if they are substantially diminished or not after saba 3) if there is an activity that reproducibly causes the symptoms, try the saba 15 min before the activity on alternate days   If in fact the saba really does help, then fine to continue to use it prn but advised may need to look closer at the maintenance regimen being used to achieve better control of airways disease with exertion.    DOE (dyspnea on exertion) Onset 2024  - wt in the 1980s p last baby = 150 - 07/25/2024   Walked on RA  x  3  lap(s) =  approx 450  ft  @ mod  pace, stopped due to end of study  with lowest 02 sats 97% with sob at end but no cp  - PFT's should help sort out restrictive component from obesity vs obst component from unresolved asthmatic component  - Doe labs ok x for d dimer= high normal value (seen commonly in the elderly or chronically ill)  may miss small peripheral pe, the clot burden with sob is moderately high and the d dimer  has a very high neg pred value if used in this setting.    Morbid obesity due to excess calories (HCC) Body mass index is 44.89 kg/m.    Lab Results  Component Value Date   TSH 0.922 07/25/2024      Contributing to doe and risk of GERD/dvt/ PE  >>>   reviewed the need and the process to achieve and maintain neg calorie balance > defer f/u primary care including  intermittently monitoring thyroid status      Each maintenance medication was reviewed in detail including emphasizing most importantly the difference between maintenance and prns and under what circumstances the prns are to be triggered using an action plan format where appropriate.  Total time for H and P, chart review, counseling, reviewing hfa  device(s) , directly observing portions of ambulatory 02 saturation study/ and generating customized AVS unique to this office visit / same day charting = 46 min  with pt new to me               AVS  Patient Instructions  Plan A = Automatic = Always=    Symbicort 80 Take 2 puffs first thing in am and then another 2 puffs about 12 hours later.   Work on inhaler technique:  relax and gently blow all the way out then take a nice smooth full deep breath back in, triggering the inhaler at same time you start breathing in.  Hold breath in for at least  5 seconds if you can. Blow out both medications  thru nose. Rinse and gargle with water  when done.  If mouth or throat bother you at all,  try brushing teeth/gums/tongue with arm and hammer toothpaste/ make a slurry and gargle and spit out.       Plan B = Backup (to supplement plan A, not to replace it) Use your albuterol inhaler as  a rescue medication to be used if you can't catch your breath by resting or slowing your pace  or doing a relaxed purse lip breathing pattern.  - The less you use it, the better it will work when you need it. - Ok to use the inhaler up to 2 puffs  every 4 hours if you must but call for appointment if use goes up over your usual need - Don't leave home without it !!  (think of it like the spare tire or starter fluid for your car)    Please remember to go to the lab department   for your tests - we will call you with the results when they are available.  Please schedule a follow up visit in 3 months but call sooner if needed PFTs on return              Ozell America,  MD 07/25/2024

## 2024-07-25 NOTE — Assessment & Plan Note (Addendum)
 Body mass index is 44.89 kg/m.    No results found for: TSH    Contributing to doe and risk of GERD/dvt/ PE  >>>   reviewed the need and the process to achieve and maintain neg calorie balance > defer f/u primary care including intermittently monitoring thyroid status

## 2024-07-25 NOTE — Patient Instructions (Addendum)
 Plan A = Automatic = Always=    Symbicort 80 Take 2 puffs first thing in am and then another 2 puffs about 12 hours later.   Work on inhaler technique:  relax and gently blow all the way out then take a nice smooth full deep breath back in, triggering the inhaler at same time you start breathing in.  Hold breath in for at least  5 seconds if you can. Blow out both medications  thru nose. Rinse and gargle with water  when done.  If mouth or throat bother you at all,  try brushing teeth/gums/tongue with arm and hammer toothpaste/ make a slurry and gargle and spit out.       Plan B = Backup (to supplement plan A, not to replace it) Use your albuterol inhaler as a rescue medication to be used if you can't catch your breath by resting or slowing your pace  or doing a relaxed purse lip breathing pattern.  - The less you use it, the better it will work when you need it. - Ok to use the inhaler up to 2 puffs  every 4 hours if you must but call for appointment if use goes up over your usual need - Don't leave home without it !!  (think of it like the spare tire or starter fluid for your car)    Please remember to go to the lab department   for your tests - we will call you with the results when they are available.  Please schedule a follow up visit in 3 months but call sooner if needed PFTs on return

## 2024-07-26 ENCOUNTER — Ambulatory Visit (HOSPITAL_COMMUNITY)
Admission: RE | Admit: 2024-07-26 | Discharge: 2024-07-26 | Disposition: A | Discharge status: Home / Self Care | Source: Ambulatory Visit | Attending: Thoracic Surgery (Cardiothoracic Vascular Surgery) | Admitting: Thoracic Surgery (Cardiothoracic Vascular Surgery)

## 2024-07-26 ENCOUNTER — Encounter (HOSPITAL_COMMUNITY)
Admission: RE | Admit: 2024-07-26 | Discharge: 2024-07-26 | Disposition: A | Discharge status: Home / Self Care | Source: Ambulatory Visit | Attending: Thoracic Surgery (Cardiothoracic Vascular Surgery) | Admitting: Thoracic Surgery (Cardiothoracic Vascular Surgery)

## 2024-07-26 ENCOUNTER — Other Ambulatory Visit: Payer: Self-pay

## 2024-07-26 ENCOUNTER — Encounter (HOSPITAL_COMMUNITY): Payer: Self-pay

## 2024-07-26 DIAGNOSIS — Z01818 Encounter for other preprocedural examination: Secondary | ICD-10-CM | POA: Diagnosis not present

## 2024-07-26 DIAGNOSIS — K449 Diaphragmatic hernia without obstruction or gangrene: Secondary | ICD-10-CM | POA: Diagnosis present

## 2024-07-26 HISTORY — DX: Unspecified asthma, uncomplicated: J45.909

## 2024-07-26 LAB — CBC
HCT: 41.7 % (ref 36.0–46.0)
Hemoglobin: 13.5 g/dL (ref 12.0–15.0)
MCH: 28.2 pg (ref 26.0–34.0)
MCHC: 32.4 g/dL (ref 30.0–36.0)
MCV: 87.2 fL (ref 80.0–100.0)
Platelets: 209 K/uL (ref 150–400)
RBC: 4.78 MIL/uL (ref 3.87–5.11)
RDW: 14 % (ref 11.5–15.5)
WBC: 4.3 K/uL (ref 4.0–10.5)
nRBC: 0 % (ref 0.0–0.2)

## 2024-07-26 LAB — URINALYSIS, ROUTINE W REFLEX MICROSCOPIC
Bilirubin Urine: NEGATIVE
Glucose, UA: NEGATIVE mg/dL
Hgb urine dipstick: NEGATIVE
Ketones, ur: NEGATIVE mg/dL
Leukocytes,Ua: NEGATIVE
Nitrite: NEGATIVE
Protein, ur: NEGATIVE mg/dL
Specific Gravity, Urine: 1.009 (ref 1.005–1.030)
pH: 6 (ref 5.0–8.0)

## 2024-07-26 LAB — TYPE AND SCREEN
ABO/RH(D): A POS
Antibody Screen: NEGATIVE

## 2024-07-26 LAB — COMPREHENSIVE METABOLIC PANEL WITH GFR
ALT: 25 U/L (ref 0–44)
AST: 27 U/L (ref 15–41)
Albumin: 4.1 g/dL (ref 3.5–5.0)
Alkaline Phosphatase: 66 U/L (ref 38–126)
Anion gap: 10 (ref 5–15)
BUN: 11 mg/dL (ref 8–23)
CO2: 25 mmol/L (ref 22–32)
Calcium: 9.3 mg/dL (ref 8.9–10.3)
Chloride: 106 mmol/L (ref 98–111)
Creatinine, Ser: 1.11 mg/dL — ABNORMAL HIGH (ref 0.44–1.00)
GFR, Estimated: 56 mL/min — ABNORMAL LOW (ref >60)
Glucose, Bld: 105 mg/dL — ABNORMAL HIGH (ref 70–99)
Potassium: 3.6 mmol/L (ref 3.5–5.1)
Sodium: 141 mmol/L (ref 135–145)
Total Bilirubin: 0.7 mg/dL (ref 0.0–1.2)
Total Protein: 6.6 g/dL (ref 6.5–8.1)

## 2024-07-26 LAB — PROTIME-INR
INR: 1 (ref 0.8–1.2)
Prothrombin Time: 14 s (ref 11.4–15.2)

## 2024-07-26 LAB — APTT: aPTT: 28 s (ref 24–36)

## 2024-07-26 LAB — GLUCOSE, CAPILLARY: Glucose-Capillary: 124 mg/dL — ABNORMAL HIGH (ref 70–99)

## 2024-07-26 NOTE — Progress Notes (Signed)
 PCP - Norleen Hurst Cardiologist - Denies  PPM/ICD - Denies Device Orders - n/a Rep Notified - n/a  Chest x-ray - 07-26-24 EKG - 07-26-24 Stress Test - Denies ECHO - 05-12-24 Cardiac Cath - Denies  Sleep Study - yes CPAP - has never worn a cpap  Fasting Blood Sugar - 110-130 Checks Blood Sugar 2-3 times a day  Last dose of GLP1 agonist- Denies   GLP1 instructions: n/a  Blood Thinner Instructions:Denies Aspirin Instructions:Denies  ERAS Protcol - npo PRE-SURGERY Ensure or G2- n/a  COVID TEST- n/a   Anesthesia review: yes, HTN, DM, oSA,   Patient denies shortness of breath, fever, cough and chest pain at PAT appointment. Patient denies any respiratory issues at this time.    All instructions explained to the patient, with a verbal understanding of the material. Patient agrees to go over the instructions while at home for a better understanding. Patient also instructed to self quarantine after being tested for COVID-19. The opportunity to ask questions was provided.

## 2024-07-27 ENCOUNTER — Encounter (HOSPITAL_COMMUNITY): Payer: Self-pay

## 2024-07-27 DIAGNOSIS — E1122 Type 2 diabetes mellitus with diabetic chronic kidney disease: Secondary | ICD-10-CM | POA: Diagnosis not present

## 2024-07-27 DIAGNOSIS — J45909 Unspecified asthma, uncomplicated: Secondary | ICD-10-CM | POA: Diagnosis not present

## 2024-07-27 DIAGNOSIS — I129 Hypertensive chronic kidney disease with stage 1 through stage 4 chronic kidney disease, or unspecified chronic kidney disease: Secondary | ICD-10-CM | POA: Diagnosis not present

## 2024-07-27 DIAGNOSIS — E1165 Type 2 diabetes mellitus with hyperglycemia: Secondary | ICD-10-CM | POA: Diagnosis not present

## 2024-07-27 DIAGNOSIS — E785 Hyperlipidemia, unspecified: Secondary | ICD-10-CM | POA: Diagnosis not present

## 2024-07-27 DIAGNOSIS — K449 Diaphragmatic hernia without obstruction or gangrene: Secondary | ICD-10-CM | POA: Diagnosis not present

## 2024-07-27 DIAGNOSIS — Z8701 Personal history of pneumonia (recurrent): Secondary | ICD-10-CM | POA: Diagnosis not present

## 2024-07-27 LAB — TSH: TSH: 0.922 u[IU]/mL (ref 0.450–4.500)

## 2024-07-27 LAB — CBC WITH DIFFERENTIAL/PLATELET
Basophils Absolute: 0 x10E3/uL (ref 0.0–0.2)
Basos: 1 %
EOS (ABSOLUTE): 0.2 x10E3/uL (ref 0.0–0.4)
Eos: 4 %
Hematocrit: 42.6 % (ref 34.0–46.6)
Hemoglobin: 14 g/dL (ref 11.1–15.9)
Immature Grans (Abs): 0 x10E3/uL (ref 0.0–0.1)
Immature Granulocytes: 0 %
Lymphocytes Absolute: 1.6 x10E3/uL (ref 0.7–3.1)
Lymphs: 32 %
MCH: 29 pg (ref 26.6–33.0)
MCHC: 32.9 g/dL (ref 31.5–35.7)
MCV: 88 fL (ref 79–97)
Monocytes Absolute: 0.7 x10E3/uL (ref 0.1–0.9)
Monocytes: 14 %
Neutrophils Absolute: 2.4 x10E3/uL (ref 1.4–7.0)
Neutrophils: 49 %
Platelets: 232 x10E3/uL (ref 150–450)
RBC: 4.83 x10E6/uL (ref 3.77–5.28)
RDW: 13.7 % (ref 11.7–15.4)
WBC: 4.8 x10E3/uL (ref 3.4–10.8)

## 2024-07-27 LAB — D-DIMER, QUANTITATIVE: D-DIMER: 1.14 mg{FEU}/L — ABNORMAL HIGH (ref 0.00–0.49)

## 2024-07-27 LAB — IGE: IgE (Immunoglobulin E), Serum: 74 [IU]/mL (ref 6–495)

## 2024-07-28 ENCOUNTER — Other Ambulatory Visit: Payer: Self-pay

## 2024-07-28 ENCOUNTER — Ambulatory Visit (HOSPITAL_COMMUNITY)

## 2024-07-28 ENCOUNTER — Encounter (HOSPITAL_COMMUNITY): Payer: Self-pay | Admitting: Thoracic Surgery (Cardiothoracic Vascular Surgery)

## 2024-07-28 ENCOUNTER — Ambulatory Visit (HOSPITAL_COMMUNITY): Payer: Self-pay | Admitting: Physician Assistant

## 2024-07-28 ENCOUNTER — Encounter (HOSPITAL_COMMUNITY)
Admission: RE | Disposition: A | Discharge status: Home / Self Care | Payer: Self-pay | Source: Home / Self Care | Attending: Thoracic Surgery (Cardiothoracic Vascular Surgery)

## 2024-07-28 ENCOUNTER — Ambulatory Visit: Payer: Self-pay | Admitting: Internal Medicine

## 2024-07-28 ENCOUNTER — Observation Stay (HOSPITAL_COMMUNITY)
Admission: RE | Admit: 2024-07-28 | Discharge: 2024-07-31 | Disposition: A | Discharge status: Home / Self Care | Attending: Thoracic Surgery (Cardiothoracic Vascular Surgery) | Admitting: Thoracic Surgery (Cardiothoracic Vascular Surgery)

## 2024-07-28 ENCOUNTER — Inpatient Hospital Stay (HOSPITAL_COMMUNITY): Payer: Self-pay | Admitting: Anesthesiology

## 2024-07-28 DIAGNOSIS — J45909 Unspecified asthma, uncomplicated: Secondary | ICD-10-CM | POA: Diagnosis not present

## 2024-07-28 DIAGNOSIS — K449 Diaphragmatic hernia without obstruction or gangrene: Principal | ICD-10-CM | POA: Insufficient documentation

## 2024-07-28 DIAGNOSIS — E119 Type 2 diabetes mellitus without complications: Secondary | ICD-10-CM | POA: Insufficient documentation

## 2024-07-28 DIAGNOSIS — Z79899 Other long term (current) drug therapy: Secondary | ICD-10-CM | POA: Diagnosis not present

## 2024-07-28 DIAGNOSIS — I10 Essential (primary) hypertension: Secondary | ICD-10-CM | POA: Diagnosis not present

## 2024-07-28 DIAGNOSIS — Z8719 Personal history of other diseases of the digestive system: Secondary | ICD-10-CM

## 2024-07-28 DIAGNOSIS — Z9889 Other specified postprocedural states: Secondary | ICD-10-CM | POA: Insufficient documentation

## 2024-07-28 LAB — CBC
HCT: 39.1 % (ref 36.0–46.0)
Hemoglobin: 12.9 g/dL (ref 12.0–15.0)
MCH: 28.4 pg (ref 26.0–34.0)
MCHC: 33 g/dL (ref 30.0–36.0)
MCV: 86.1 fL (ref 80.0–100.0)
Platelets: 189 K/uL (ref 150–400)
RBC: 4.54 MIL/uL (ref 3.87–5.11)
RDW: 13.8 % (ref 11.5–15.5)
WBC: 8.4 K/uL (ref 4.0–10.5)
nRBC: 0 % (ref 0.0–0.2)

## 2024-07-28 LAB — GLUCOSE, CAPILLARY
Glucose-Capillary: 154 mg/dL — ABNORMAL HIGH (ref 70–99)
Glucose-Capillary: 186 mg/dL — ABNORMAL HIGH (ref 70–99)
Glucose-Capillary: 152 mg/dL — ABNORMAL HIGH (ref 70–99)
Glucose-Capillary: 152 mg/dL — ABNORMAL HIGH (ref 70–99)

## 2024-07-28 LAB — CREATININE, SERUM
Creatinine, Ser: 1.09 mg/dL — ABNORMAL HIGH (ref 0.44–1.00)
GFR, Estimated: 57 mL/min — ABNORMAL LOW (ref >60)

## 2024-07-28 SURGERY — REPAIR, HERNIA, PARAESOPHAGEAL, ROBOT-ASSISTED
Anesthesia: General | Site: Chest

## 2024-07-28 MED ORDER — ROCURONIUM BROMIDE 10 MG/ML (PF) SYRINGE
PREFILLED_SYRINGE | INTRAVENOUS | Status: AC
  Filled 2024-07-28: qty 10

## 2024-07-28 MED ORDER — PROPOFOL 500 MG/50ML IV EMUL
INTRAVENOUS | Status: DC | PRN
  Administered 2024-07-28 (×2): 125 ug/kg/min via INTRAVENOUS
  Administered 2024-07-28: 150 ug/kg/min via INTRAVENOUS

## 2024-07-28 MED ORDER — ONDANSETRON HCL 4 MG/2ML IJ SOLN
INTRAMUSCULAR | Status: AC
Start: 2024-07-28 — End: 2024-07-28
  Filled 2024-07-28: qty 2

## 2024-07-28 MED ORDER — INSULIN ASPART 100 UNIT/ML IJ SOLN: 0.0000 [IU] | Freq: Every day | INTRAMUSCULAR | Status: DC

## 2024-07-28 MED ORDER — FENTANYL CITRATE (PF) 100 MCG/2ML IJ SOLN
INTRAMUSCULAR | Status: AC
  Filled 2024-07-28: qty 2

## 2024-07-28 MED ORDER — 0.9 % SODIUM CHLORIDE (POUR BTL) OPTIME
TOPICAL | Status: DC | PRN
  Administered 2024-07-28: 1000 mL

## 2024-07-28 MED ORDER — BUPIVACAINE LIPOSOME 1.3 % IJ SUSP
INTRAMUSCULAR | Status: DC | PRN
  Administered 2024-07-28: 50 mL

## 2024-07-28 MED ORDER — ALBUMIN HUMAN 5 % IV SOLN
INTRAVENOUS | Status: AC
  Filled 2024-07-28: qty 250

## 2024-07-28 MED ORDER — KETOROLAC TROMETHAMINE 15 MG/ML IJ SOLN
15.0000 mg | Freq: Four times a day (QID) | INTRAMUSCULAR | Status: DC | PRN
  Administered 2024-07-28 – 2024-07-30 (×2): 15 mg via INTRAVENOUS
  Filled 2024-07-28 (×2): qty 1

## 2024-07-28 MED ORDER — CEFAZOLIN SODIUM-DEXTROSE 2-4 GM/100ML-% IV SOLN
2.0000 g | INTRAVENOUS | Status: AC
  Administered 2024-07-28: 2 g via INTRAVENOUS
  Filled 2024-07-28: qty 100

## 2024-07-28 MED ORDER — DEXAMETHASONE SODIUM PHOSPHATE 10 MG/ML IJ SOLN
INTRAMUSCULAR | Status: DC | PRN
  Administered 2024-07-28: 5 mg via INTRAVENOUS

## 2024-07-28 MED ORDER — PROPOFOL 10 MG/ML IV BOLUS
INTRAVENOUS | Status: DC | PRN
  Administered 2024-07-28: 120 mg via INTRAVENOUS
  Administered 2024-07-28: 40 mg via INTRAVENOUS

## 2024-07-28 MED ORDER — ONDANSETRON HCL 4 MG/2ML IJ SOLN
INTRAMUSCULAR | Status: DC | PRN
  Administered 2024-07-28: 4 mg via INTRAVENOUS

## 2024-07-28 MED ORDER — FENTANYL CITRATE (PF) 250 MCG/5ML IJ SOLN
INTRAMUSCULAR | Status: AC
  Filled 2024-07-28: qty 5

## 2024-07-28 MED ORDER — ACETAMINOPHEN 10 MG/ML IV SOLN
INTRAVENOUS | Status: DC | PRN
Start: 2024-07-28 — End: 2024-07-28
  Administered 2024-07-28: 1000 mg via INTRAVENOUS

## 2024-07-28 MED ORDER — PROPOFOL 10 MG/ML IV BOLUS
INTRAVENOUS | Status: AC
  Filled 2024-07-28: qty 20

## 2024-07-28 MED ORDER — FENTANYL CITRATE (PF) 100 MCG/2ML IJ SOLN
25.0000 ug | INTRAMUSCULAR | Status: DC | PRN
  Administered 2024-07-28 (×2): 25 ug via INTRAVENOUS

## 2024-07-28 MED ORDER — CHLORHEXIDINE GLUCONATE 0.12 % MT SOLN
15.0000 mL | Freq: Once | OROMUCOSAL | Status: AC
  Administered 2024-07-28: 15 mL via OROMUCOSAL
  Filled 2024-07-28: qty 15

## 2024-07-28 MED ORDER — OXYCODONE HCL 5 MG PO TABS: 5.0000 mg | ORAL_TABLET | Freq: Once | ORAL | Status: DC | PRN

## 2024-07-28 MED ORDER — BUPIVACAINE LIPOSOME 1.3 % IJ SUSP
INTRAMUSCULAR | Status: AC
  Filled 2024-07-28: qty 20

## 2024-07-28 MED ORDER — EPHEDRINE SULFATE-NACL 50-0.9 MG/10ML-% IV SOSY
PREFILLED_SYRINGE | INTRAVENOUS | Status: DC | PRN
  Administered 2024-07-28: 5 mg via INTRAVENOUS
  Administered 2024-07-28: 2.5 mg via INTRAVENOUS

## 2024-07-28 MED ORDER — INSULIN ASPART 100 UNIT/ML IJ SOLN: 0.0000 [IU] | INTRAMUSCULAR | Status: DC | PRN

## 2024-07-28 MED ORDER — ONDANSETRON 4 MG PO TBDP
4.0000 mg | ORAL_TABLET | Freq: Four times a day (QID) | ORAL | Status: DC | PRN
  Administered 2024-07-28 – 2024-07-29 (×2): 4 mg via ORAL
  Filled 2024-07-28 (×2): qty 1

## 2024-07-28 MED ORDER — ROCURONIUM BROMIDE 10 MG/ML (PF) SYRINGE
PREFILLED_SYRINGE | INTRAVENOUS | Status: DC | PRN
  Administered 2024-07-28: 20 mg via INTRAVENOUS
  Administered 2024-07-28: 90 mg via INTRAVENOUS
  Administered 2024-07-28: 20 mg via INTRAVENOUS
  Administered 2024-07-28: 30 mg via INTRAVENOUS

## 2024-07-28 MED ORDER — ACETAMINOPHEN 10 MG/ML IV SOLN: 1000.0000 mg | Freq: Once | INTRAVENOUS | Status: DC | PRN

## 2024-07-28 MED ORDER — FENTANYL CITRATE (PF) 50 MCG/ML IJ SOSY
25.0000 ug | PREFILLED_SYRINGE | INTRAMUSCULAR | Status: DC | PRN
  Administered 2024-07-28 (×2): 25 ug via INTRAVENOUS
  Filled 2024-07-28 (×2): qty 1

## 2024-07-28 MED ORDER — ENOXAPARIN SODIUM 40 MG/0.4ML IJ SOSY
40.0000 mg | PREFILLED_SYRINGE | INTRAMUSCULAR | Status: DC
  Administered 2024-07-29 – 2024-07-31 (×3): 40 mg via SUBCUTANEOUS
  Filled 2024-07-28 (×3): qty 0.4

## 2024-07-28 MED ORDER — MIDAZOLAM HCL 2 MG/2ML IJ SOLN
INTRAMUSCULAR | Status: AC
Start: 2024-07-28 — End: 2024-07-28
  Filled 2024-07-28: qty 2

## 2024-07-28 MED ORDER — OXYCODONE HCL 5 MG/5ML PO SOLN: 5.0000 mg | Freq: Once | ORAL | Status: DC | PRN

## 2024-07-28 MED ORDER — PHENYLEPHRINE 80 MCG/ML (10ML) SYRINGE FOR IV PUSH (FOR BLOOD PRESSURE SUPPORT)
PREFILLED_SYRINGE | INTRAVENOUS | Status: DC | PRN
  Administered 2024-07-28: 80 ug via INTRAVENOUS
  Administered 2024-07-28: 120 ug via INTRAVENOUS
  Administered 2024-07-28: 80 ug via INTRAVENOUS

## 2024-07-28 MED ORDER — FLUTICASONE FUROATE-VILANTEROL 100-25 MCG/ACT IN AEPB
1.0000 | INHALATION_SPRAY | Freq: Every day | RESPIRATORY_TRACT | Status: DC
  Administered 2024-07-29 – 2024-07-31 (×3): 1 via RESPIRATORY_TRACT
  Filled 2024-07-28: qty 28

## 2024-07-28 MED ORDER — ACETAMINOPHEN 10 MG/ML IV SOLN
INTRAVENOUS | Status: AC
  Filled 2024-07-28: qty 100

## 2024-07-28 MED ORDER — CEFAZOLIN SODIUM-DEXTROSE 2-4 GM/100ML-% IV SOLN
2.0000 g | Freq: Three times a day (TID) | INTRAVENOUS | Status: AC
  Administered 2024-07-28: 2 g via INTRAVENOUS
  Filled 2024-07-28: qty 100

## 2024-07-28 MED ORDER — ALBUMIN HUMAN 5 % IV SOLN: INTRAVENOUS | Status: DC | PRN

## 2024-07-28 MED ORDER — ALBUMIN HUMAN 5 % IV SOLN
12.5000 g | Freq: Once | INTRAVENOUS | Status: AC
  Administered 2024-07-28: 12.5 g via INTRAVENOUS

## 2024-07-28 MED ORDER — PANTOPRAZOLE SODIUM 40 MG IV SOLR
40.0000 mg | Freq: Every day | INTRAVENOUS | Status: DC
  Administered 2024-07-28 – 2024-07-29 (×2): 40 mg via INTRAVENOUS
  Filled 2024-07-28 (×2): qty 10

## 2024-07-28 MED ORDER — ORAL CARE MOUTH RINSE
15.0000 mL | Freq: Once | OROMUCOSAL | Status: AC
Start: 2024-07-28 — End: 2024-07-28

## 2024-07-28 MED ORDER — LIDOCAINE 2% (20 MG/ML) 5 ML SYRINGE
INTRAMUSCULAR | Status: AC
  Filled 2024-07-28: qty 5

## 2024-07-28 MED ORDER — BUPIVACAINE HCL (PF) 0.5 % IJ SOLN
INTRAMUSCULAR | Status: AC
  Filled 2024-07-28: qty 30

## 2024-07-28 MED ORDER — LACTATED RINGERS IV SOLN: INTRAVENOUS | Status: DC

## 2024-07-28 MED ORDER — HYDROMORPHONE HCL 1 MG/ML IJ SOLN
1.0000 mg | INTRAMUSCULAR | Status: DC | PRN
  Administered 2024-07-28 – 2024-07-30 (×6): 1 mg via INTRAVENOUS
  Filled 2024-07-28 (×6): qty 1

## 2024-07-28 MED ORDER — FLUTICASONE FUROATE-VILANTEROL 100-25 MCG/ACT IN AEPB: 1.0000 | INHALATION_SPRAY | Freq: Every day | RESPIRATORY_TRACT | Status: DC

## 2024-07-28 MED ORDER — INSULIN ASPART 100 UNIT/ML IJ SOLN
0.0000 [IU] | Freq: Three times a day (TID) | INTRAMUSCULAR | Status: DC
  Administered 2024-07-28: 4 [IU] via SUBCUTANEOUS
  Administered 2024-07-29 (×2): 2 [IU] via SUBCUTANEOUS
  Administered 2024-07-30: 3 [IU] via SUBCUTANEOUS
  Administered 2024-07-30 – 2024-07-31 (×3): 2 [IU] via SUBCUTANEOUS

## 2024-07-28 MED ORDER — PROCHLORPERAZINE EDISYLATE 10 MG/2ML IJ SOLN
10.0000 mg | Freq: Four times a day (QID) | INTRAMUSCULAR | Status: DC | PRN
  Administered 2024-07-28 – 2024-07-29 (×2): 10 mg via INTRAVENOUS
  Filled 2024-07-28 (×2): qty 2

## 2024-07-28 MED ORDER — HYDROQUINONE 4 % EX CREA: 1.0000 | TOPICAL_CREAM | CUTANEOUS | Status: DC

## 2024-07-28 MED ORDER — ONDANSETRON HCL 4 MG/2ML IJ SOLN
4.0000 mg | Freq: Four times a day (QID) | INTRAMUSCULAR | Status: DC | PRN
  Administered 2024-07-28: 4 mg via INTRAVENOUS
  Filled 2024-07-28: qty 2

## 2024-07-28 MED ORDER — SUGAMMADEX SODIUM 200 MG/2ML IV SOLN
INTRAVENOUS | Status: DC | PRN
  Administered 2024-07-28: 400 mg via INTRAVENOUS

## 2024-07-28 MED ORDER — KETOROLAC TROMETHAMINE 15 MG/ML IJ SOLN
15.0000 mg | Freq: Four times a day (QID) | INTRAMUSCULAR | Status: AC
  Administered 2024-07-28: 15 mg via INTRAVENOUS
  Filled 2024-07-28: qty 1

## 2024-07-28 MED ORDER — PHENYLEPHRINE HCL-NACL 20-0.9 MG/250ML-% IV SOLN
INTRAVENOUS | Status: DC | PRN
  Administered 2024-07-28: 20 ug/min via INTRAVENOUS

## 2024-07-28 MED ORDER — FENTANYL CITRATE (PF) 250 MCG/5ML IJ SOLN
INTRAMUSCULAR | Status: DC | PRN
  Administered 2024-07-28: 50 ug via INTRAVENOUS
  Administered 2024-07-28: 100 ug via INTRAVENOUS

## 2024-07-28 MED ORDER — MIDAZOLAM HCL 2 MG/2ML IJ SOLN
INTRAMUSCULAR | Status: DC | PRN
  Administered 2024-07-28: 2 mg via INTRAVENOUS

## 2024-07-28 MED ORDER — ALBUTEROL SULFATE (2.5 MG/3ML) 0.083% IN NEBU
2.5000 mg | INHALATION_SOLUTION | Freq: Four times a day (QID) | RESPIRATORY_TRACT | Status: DC | PRN
  Administered 2024-07-30: 2.5 mg via RESPIRATORY_TRACT
  Filled 2024-07-28: qty 3

## 2024-07-28 MED ORDER — KETOROLAC TROMETHAMINE 15 MG/ML IJ SOLN: 15.0000 mg | Freq: Four times a day (QID) | INTRAMUSCULAR | Status: DC | PRN

## 2024-07-28 MED ORDER — LIDOCAINE 2% (20 MG/ML) 5 ML SYRINGE
INTRAMUSCULAR | Status: DC | PRN
  Administered 2024-07-28: 80 mg via INTRAVENOUS

## 2024-07-28 MED ORDER — PHENYLEPHRINE 80 MCG/ML (10ML) SYRINGE FOR IV PUSH (FOR BLOOD PRESSURE SUPPORT)
PREFILLED_SYRINGE | INTRAVENOUS | Status: AC
  Filled 2024-07-28: qty 10

## 2024-07-28 MED ORDER — DEXAMETHASONE SODIUM PHOSPHATE 10 MG/ML IJ SOLN
INTRAMUSCULAR | Status: AC
  Filled 2024-07-28: qty 1

## 2024-07-28 SURGICAL SUPPLY — 67 items
BLADE SURG 11 STRL SS (BLADE) ×3 IMPLANT
BUTTON OLYMPUS DEFENDO 5 PIECE (MISCELLANEOUS) ×3 IMPLANT
CANISTER SUCTION 3000ML PPV (SUCTIONS) ×6 IMPLANT
CNTNR URN SCR LID CUP LEK RST (MISCELLANEOUS) ×3 IMPLANT
DEFOGGER SCOPE WARM SEASHARP (MISCELLANEOUS) ×3 IMPLANT
DERMABOND ADVANCED .7 DNX12 (GAUZE/BANDAGES/DRESSINGS) ×3 IMPLANT
DEVICE SUTURE ENDOST 10MM (ENDOMECHANICALS) IMPLANT
DRAPE ARM DVNC X/XI (DISPOSABLE) ×12 IMPLANT
DRAPE COLUMN DVNC XI (DISPOSABLE) ×3 IMPLANT
DRAPE CV SPLIT W-CLR ANES SCRN (DRAPES) ×3 IMPLANT
DRAPE INCISE IOBAN 66X45 STRL (DRAPES) IMPLANT
DRAPE SURG ORHT 6 SPLT 77X108 (DRAPES) ×3 IMPLANT
DRIVER NDL LRG 8 DVNC XI (INSTRUMENTS) IMPLANT
DRIVER NDL MEGA SUTCUT DVNCXI (INSTRUMENTS) IMPLANT
DRIVER NDLE LRG 8 DVNC XI (INSTRUMENTS) ×3 IMPLANT
DRIVER NDLE MEGA SUTCUT DVNCXI (INSTRUMENTS) IMPLANT
ELECTRODE REM PT RTRN 9FT ADLT (ELECTROSURGICAL) ×3 IMPLANT
FELT TEFLON 1X6 (MISCELLANEOUS) IMPLANT
FORCEPS BPLR LNG DVNC XI (INSTRUMENTS) IMPLANT
FORCEPS CADIERE DVNC XI (FORCEP) IMPLANT
GAUZE SPONGE 4X4 12PLY STRL (GAUZE/BANDAGES/DRESSINGS) ×3 IMPLANT
GLOVE BIO SURGEON STRL SZ7 (GLOVE) ×3 IMPLANT
GLOVE BIO SURGEON STRL SZ7.5 (GLOVE) ×9 IMPLANT
GOWN STRL REUS W/ TWL LRG LVL3 (GOWN DISPOSABLE) ×3 IMPLANT
GOWN STRL REUS W/ TWL XL LVL3 (GOWN DISPOSABLE) ×6 IMPLANT
GOWN STRL REUS W/TWL 2XL LVL3 (GOWN DISPOSABLE) ×3 IMPLANT
GOWN STRL SURGICAL XL XLNG (GOWN DISPOSABLE) ×9 IMPLANT
GRASPER TIP-UP FEN DVNC XI (INSTRUMENTS) IMPLANT
HEMOSTAT SURGICEL 2X14 (HEMOSTASIS) ×3 IMPLANT
IV 0.9% NACL 1000 ML (IV SOLUTION) IMPLANT
KIT BASIN OR (CUSTOM PROCEDURE TRAY) ×3 IMPLANT
KIT TURNOVER KIT B (KITS) ×3 IMPLANT
LIGASURE LAP MARYLAND 5MM 37CM (ELECTROSURGICAL) IMPLANT
MARKER SKIN DUAL TIP RULER LAB (MISCELLANEOUS) ×3 IMPLANT
MESH OVITEX 1S RESORB 6X10 6L (Mesh General) IMPLANT
NDL HYPO 22X1.5 SAFETY MO (MISCELLANEOUS) ×3 IMPLANT
NEEDLE HYPO 22X1.5 SAFETY MO (MISCELLANEOUS) ×3 IMPLANT
OBTURATOR OPTICALSTD 8 DVNC (TROCAR) ×3 IMPLANT
OIL SILICONE PENTAX (PARTS (SERVICE/REPAIRS)) IMPLANT
PACK CHEST (CUSTOM PROCEDURE TRAY) ×3 IMPLANT
PAD ARMBOARD POSITIONER FOAM (MISCELLANEOUS) ×6 IMPLANT
PORT ACCESS TROCAR AIRSEAL 12 (TROCAR) IMPLANT
SEAL UNIV 5-12 XI (MISCELLANEOUS) ×12 IMPLANT
SEALER SYNCHRO 8 IS4000 DVNC (MISCELLANEOUS) IMPLANT
SET TRI-LUMEN FLTR TB AIRSEAL (TUBING) ×3 IMPLANT
SOLN 0.9% NACL 1000 ML (IV SOLUTION) ×6 IMPLANT
SOLN 0.9% NACL POUR BTL 1000ML (IV SOLUTION) ×6 IMPLANT
SOLN STERILE WATER 1000 ML (IV SOLUTION) ×3 IMPLANT
SOLN STERILE WATER BTL 1000 ML (IV SOLUTION) ×3 IMPLANT
SUT ETHIBOND 0 36 GRN (SUTURE) ×6 IMPLANT
SUT SILK 1 MH (SUTURE) ×3 IMPLANT
SUT SURGIDAC NAB ES-9 0 48 120 (SUTURE) IMPLANT
SUT VIC AB 2-0 CT1 18 (SUTURE) ×3 IMPLANT
SUT VIC AB 3-0 SH 27X BRD (SUTURE) ×6 IMPLANT
SUT VICRYL 0 UR6 27IN ABS (SUTURE) ×6 IMPLANT
SYR 20ML ECCENTRIC (SYRINGE) ×3 IMPLANT
SYR BULB IRRIG 60ML STRL (SYRINGE) IMPLANT
SYSTEM SAHARA CHEST DRAIN ATS (WOUND CARE) IMPLANT
TOWEL GREEN STERILE (TOWEL DISPOSABLE) ×3 IMPLANT
TOWEL GREEN STERILE FF (TOWEL DISPOSABLE) ×3 IMPLANT
TRAY FOLEY MTR SLVR 16FR STAT (SET/KITS/TRAYS/PACK) ×3 IMPLANT
TROCAR PORT AIRSEAL 8X120 (TROCAR) IMPLANT
TROCAR XCEL BLADELESS 5X75MML (TROCAR) ×3 IMPLANT
TROCAR XCEL NON-BLD 5MMX100MML (ENDOMECHANICALS) IMPLANT
TUBE CONNECTING 20X1/4 (TUBING) ×3 IMPLANT
TUBING ENDO SMARTCAP (MISCELLANEOUS) ×3 IMPLANT
UNDERPAD 30X36 HEAVY ABSORB (UNDERPADS AND DIAPERS) ×3 IMPLANT

## 2024-07-28 NOTE — Op Note (Signed)
 [address]       [place] [address]             [phone]        07/28/2024  Patient:  Rhonda Patton Pre-Op Dx: hiatal hernia   Post-op Dx:  same Procedure: - Esophagoscopy - Robotic assisted laparoscopy - Paraesophageal hernia repair toupet with Ovitex 1S resorbable pledgets   Surgeon and Role:      * Coreyon Nicotra, Linnie KIDD, MD - Primary  Assistant: W-Gold, PA-C  An experienced assistant was required given the complexity of this surgery and the standard of surgical care. The assistant was needed for exposure, dissection, suctioning, retraction of delicate tissues and sutures, instrument exchange and for overall help during this procedure.   Anesthesia  general EBL:  10ml Blood Administration: none Specimen:  none   Counts: correct   Indications: 63yo female with small hiatal hernia, dysphagia, short- segment Barrett' s disease, a mild Schatzki ring and shortness of breath.  She is also obese with a BMI of 45.  We discussed several options for treatment including a bariatric surgery along with her hiatal hernia repair.  I advised this given her higher risk of recurrence, but she stated that she did not want to delay things by seeing another surgeon.  She would like to expidite this given her symptoms and anxiety whenever she has chest pain.  In that regard, it may be due to her ring, which may still persists after the operation.  I explained this to her as well.  She stated that she would like to proceed with a hiatal hernia repair.  I will also perform an EGD and a Toupet fundoplication.   Findings: 4 posterior 1 anterior stich.  Operative Technique: After the risks, benefits and alternatives were thoroughly discussed, the patient was brought to the operative theatre.  Anesthesia was induced, and the esophagoscope was passed through the oropharynx down to the stomach.  The scope was retroflexed and the hiatal hernia was clearly evident.  The  scope was pulled back and the mucosal surface of the esophagus was visualized.    The scope was then parked at 25 cm from the incisors.  The patient was then prepped and draped in normal sterile fashion.  An appropriate surgical pause was performed, and pre-operative antibiotics were dosed accordingly.  We began with a 1 cm incision 15 cm caudad from the xiphoid and slightly lateral to the umbilicus.  Using an Optiview we entered the peritoneal space.  The abdomen was then insufflated with CO2.  3 other robotic ports were placed to triangulate the hiatus.  Another 12 mm port was placed in place at the level of the umbilicus laterally for an assistant port and another 5 mm trocar was placed in the right lower quadrant for liver retractor.  The patient was then placed in steep reverse Trendelenburg and the liver was elevated to expose the esophageal hiatus.  And then the robot was docked.  We began by dividing the gastrohepatic ligament to expose the right diaphragmatic crus and then dissected the hernia sac in a clockwise fashion to mobilize there the stomach and esophagus.  We then divided the short gastrics and moved towards the right crus and completed our dissection along the esophageal hiatus.  A Penrose drain was then used to encircle the the esophagus and we continued our dissection up into the mediastinum.  Once we had achieved 3 to 4 cm of intra-abdominal esophagus  we then proceeded to reapproximate the crura with 0 Ethibond sutures in an interrupted fashion.  The gastroscope was passed down through the lower esophageal sphincter into the stomach and would act as our bougie during this repair.  Next the stomach was passed posterior to the esophagus and Toupet fundoplication was performed.  An air leak test was performed using the gastroscope.  No leak was evident.  The liver retractor was removed and all ports were removed under direct visualization.  The skin and soft tissue were closed with absorbable  suture    The patient tolerated the procedure without any immediate complications, and was transferred to the PACU in stable condition.  Ashir Kunz MALVA Rayas

## 2024-07-28 NOTE — Anesthesia Procedure Notes (Signed)
 Procedure Name: Intubation Date/Time: 07/28/2024 7:59 AM  Performed by: Thomasina Laurence GRADE, RNPre-anesthesia Checklist: Patient identified, Emergency Drugs available, Suction available and Patient being monitored Patient Re-evaluated:Patient Re-evaluated prior to induction Oxygen Delivery Method: Circle System Utilized Preoxygenation: Pre-oxygenation with 100% oxygen Induction Type: IV induction Ventilation: Two handed mask ventilation required Laryngoscope Size: Mac and 3 Grade View: Grade II Tube type: Oral Tube size: 7.5 mm Number of attempts: 1 Airway Equipment and Method: Stylet and Oral airway Placement Confirmation: ETT inserted through vocal cords under direct vision, positive ETCO2 and breath sounds checked- equal and bilateral Secured at: 22 cm Tube secured with: Tape Dental Injury: Teeth and Oropharynx as per pre-operative assessment

## 2024-07-28 NOTE — Discharge Instructions (Signed)
 Discharge Instructions:  1. You may shower, please wash incisions daily with soap and water  and keep dry.  If you wish to cover wounds with dressing you may do so but please keep clean and change daily.  No tub baths or swimming until incisions have completely healed.  If your incisions become red or develop any drainage please call our office at [phone]  2. No Driving for one week  and you are no longer using narcotic pain medications  3. Monitor your weight daily.. Please use the same scale and weigh at same time... If you gain 5-10 lbs in 48 hours with associated lower extremity swelling, please contact our office at [phone]  4. Fever of 101.5 for at least 24 hours with no source, please contact our office at [phone]  5. Activity- up as tolerated, please walk at least 3 times per day.  Avoid strenuous activity, no lifting, pushing, or pulling with your arms over 8-10 lbs for a minimum of 6 weeks  6. If any questions or concerns arise, please do not hesitate to contact our office at [phone]

## 2024-07-28 NOTE — Brief Op Note (Signed)
 07/28/2024  10:41 AM  PATIENT:  Rhonda Patton  63 y.o. female  PRE-OPERATIVE DIAGNOSIS:  HIATAL HERNIA  POST-OPERATIVE DIAGNOSIS:  HIATAL HERNIA  PROCEDURE:  Procedure(s) with comments: REPAIR, HERNIA, PARAESOPHAGEAL, ROBOT-ASSISTED (N/A) - WITH FUNDOPLICATION EGD (ESOPHAGOGASTRODUODENOSCOPY) (N/A)  SURGEON:  Surgeons and Role:    * Lightfoot, Linnie KIDD, MD - Primary  PHYSICIAN ASSISTANT: May Manrique PA-C  ASSISTANTS: none   ANESTHESIA:   local and general  EBL:   25 ML  BLOOD ADMINISTERED:none  DRAINS: none   LOCAL MEDICATIONS USED:  BUPIVICAINE  and OTHER EXPAREL   SPECIMEN:  No Specimen  DISPOSITION OF SPECIMEN:  N/A  COUNTS:  YES  TOURNIQUET:  * No tourniquets in log *  DICTATION: .Dragon Dictation  PLAN OF CARE: Admit to inpatient   PATIENT DISPOSITION:  PACU - hemodynamically stable.   Delay start of Pharmacological VTE agent (>24hrs) due to surgical blood loss or risk of bleeding: no  COMPLICATIONS: NO KNOWN

## 2024-07-28 NOTE — Hospital Course (Addendum)
  Referring: Hyacinth Honey, NP Primary Care: Shona Norleen PEDLAR, MD Primary Cardiologist: None   History of Present Illness:   At time of thoracic surgical consultation Rhonda Patton 63 y.o. female presents for surgical evaluation of a hiatal hernia.  She has a long history of medically controlled reflux.  Recently, she has been experiencing significant dyphagia with most meals.  She underwent an upper endoscopy which identified short- segment Barrett' s disease, and a  Schatzki ring.  She was also recently diagnosed with adult onset asthma.    Dr. Shyrl discussed all of the treatment options including bariatric surgery along with her hiatal hernia repair.  All the risks and benefits of the procedure were discussed and she agreed to proceed with repair of the hiatal hernia.  Hospital course:  The patient was admitted electively and taken the operating room on 07/28/2024 at which time she underwent robotic assisted repair of paraesophageal hernia.  She tolerated the procedure well was taken to the postanesthesia care unit in stable condition.    Postoperative hospital course:  The patient's vitals have remained stable. She has remained in normal sinus rhythm. Oxygen has been weaned and she maintains good saturations on room air.  Renal function has remained normal.  She does not have any anemia associated with surgery.  A swallow study was done on postop day 1 and showed no sign of leak.  She was advanced to a dysphagia 1 diet and tolerated this well. That would be continued until outpatient follow-up in the office with surgeon. She developed a fever up to 101.1 and leukocytosis but this quickly resolved and there was no clear sign of infection. CXR showed resolved pneumothorax but low lung volumes on CXR, per Dr. Lang instruction IS was not given. She began ambulating well on RA and incisions were healing well without sign of infection. PT evaluation felt she would not require home  health. The patient was instructed to crush ALL medications when discharged and take in applesauce. She was felt stable for discharge home.

## 2024-07-28 NOTE — Interval H&P Note (Signed)
 History and Physical Interval Note:  07/28/2024 7:27 AM  Rhonda Patton  has presented today for surgery, with the diagnosis of HIATAL HERNIA.  The various methods of treatment have been discussed with the patient and family. After consideration of risks, benefits and other options for treatment, the patient has consented to  Procedure(s) with comments: REPAIR, HERNIA, PARAESOPHAGEAL, ROBOT-ASSISTED (N/A) - WITH FUNDOPLICATION EGD (ESOPHAGOGASTRODUODENOSCOPY) (N/A) as a surgical intervention.  The patient's history has been reviewed, patient examined, no change in status, stable for surgery.  I have reviewed the patient's chart and labs.  Questions were answered to the patient's satisfaction.     Kennieth Plotts MALVA Rayas

## 2024-07-28 NOTE — Plan of Care (Signed)
 Discussed with patient plan of care for the evening, pain management and as needed medications for nausea with some teach back displayed  Problem: Education: Goal: Knowledge of General Education information will improve Description: Including pain rating scale, medication(s)/side effects and non-pharmacologic comfort measures Outcome: Progressing   Problem: Health Behavior/Discharge Planning: Goal: Ability to manage health-related needs will improve Outcome: Progressing   Problem: Coping: Goal: Level of anxiety will decrease Outcome: Not Progressing   Problem: Pain Managment: Goal: General experience of comfort will improve and/or be controlled Outcome: Not Progressing

## 2024-07-28 NOTE — Progress Notes (Signed)
 At x2 lmtcb regarding labs

## 2024-07-28 NOTE — Discharge Summary (Cosign Needed Addendum)
 Physician Discharge Summary                [address]               [place], [place] [address]                      [phone]   Patient ID: Rhonda Patton MRN: [id number] DOB/AGE: Mar 11, 1961 63 y.o.  Admit date: 07/28/2024 Discharge date: 07/31/2024  Admission Diagnoses: Hiatal hernia  Discharge Diagnoses:  Principal Problem:   Hiatal hernia Active Problems:   S/P repair of paraesophageal hernia   Consults: None  Procedure (s): Robotic repair of hiatal hernia on 07/28/2024 by Dr. Shyrl   Referring: Hyacinth Honey, NP Primary Care: Shona Norleen PEDLAR, MD Primary Cardiologist: None   History of Present Illness:   At time of thoracic surgical consultation Rhonda Patton 63 y.o. female presents for surgical evaluation of a hiatal hernia.  She has a long history of medically controlled reflux.  Recently, she has been experiencing significant dyphagia with most meals.  She underwent an upper endoscopy which identified short- segment Barrett' s disease, and a  Schatzki ring.  She was also recently diagnosed with adult onset asthma.    Dr. Shyrl discussed all of the treatment options including bariatric surgery along with her hiatal hernia repair.  All the risks and benefits of the procedure were discussed and she agreed to proceed with repair of the hiatal hernia.  Hospital course:  The patient was admitted electively and taken the operating room on 07/28/2024 at which time she underwent robotic assisted repair of paraesophageal hernia.  She tolerated the procedure well was taken to the postanesthesia care unit in stable condition.    Postoperative hospital course:  The patient's vitals have remained stable. She has remained in normal sinus rhythm. Oxygen has been weaned and she maintains good saturations on room air.  Renal function has remained normal.  She does not have any anemia associated with surgery.  A swallow study was done on postop day 1  and showed no sign of leak.  She was advanced to a dysphagia 1 diet and tolerated this well. That would be continued until outpatient follow-up in the office with surgeon. She developed a fever up to 101.1 and leukocytosis but this quickly resolved and there was no clear sign of infection. CXR showed resolved pneumothorax but low lung volumes on CXR, per Dr. Lang instruction IS was not given. She began ambulating well on RA and incisions were healing well without sign of infection. PT evaluation felt she would not require home health. The patient was instructed to crush ALL medications when discharged and take in applesauce. She was felt stable for discharge home.     Latest Vital Signs: Blood pressure 98/76, pulse 71, temperature 97.8 F (36.6 C), temperature source Oral, resp. rate 20, height 5' 3 (1.6 m), weight 104.3 kg, SpO2 93%.  Physical Exam: General appearance: alert, cooperative, and no distress Neurologic: intact Heart: regular rate and rhythm, S1, S2 normal, no murmur, click, rub or gallop Lungs: clear to auscultation bilaterally Abdomen: soft, non-tender; bowel sounds normal; no masses,  no organomegaly Extremities: extremities normal, atraumatic, no cyanosis or edema Wound: Clean and dry without sign of infection  Discharge Condition:stable  Recent laboratory studies:  Lab Results  Component Value Date   WBC 9.4 07/31/2024   HGB 12.2 07/31/2024   HCT 36.6 07/31/2024   MCV 85.5  07/31/2024   PLT 164 07/31/2024   Lab Results  Component Value Date   NA 138 07/31/2024   K 3.4 (L) 07/31/2024   CL 104 07/31/2024   CO2 24 07/31/2024   CREATININE 0.92 07/31/2024   GLUCOSE 128 (H) 07/31/2024      Diagnostic Studies: DG Chest Port 1 View Result Date: 07/31/2024 CLINICAL DATA:  [id number] S/P hernia repair [id number] EXAM: PORTABLE CHEST 1 VIEW COMPARISON:  July 29, 2024 FINDINGS: The cardiomediastinal silhouette is unchanged in contour.Low lung volume radiograph with  perihilar bronchovascular crowding. No pleural effusion. No pneumothorax. No acute pleuroparenchymal abnormality. IMPRESSION: Low lung volume radiograph with bronchovascular crowding. Electronically Signed   By: Corean Salter M.D.   On: 07/31/2024 07:54   DG ESOPHAGUS W SINGLE CM (SOL OR THIN BA) Result Date: 07/29/2024 CLINICAL DATA:  [id number] S/P repair of paraesophageal hernia [id number] 63 year old female status post paraesophageal hernia repair with fundoplication on 07/28/2024. IR was requested for water -soluble contrast esophagram for anastomosis check. EXAM: ESOPHAGUS/BARIUM SWALLOW/TABLET STUDY TECHNIQUE: Single contrast examination was performed using thin liquid barium. This exam was performed by Carlin Griffon, PA-C, and was supervised and interpreted by Dr. Thom Hall, MD. FLUOROSCOPY: Radiation Exposure Index (as provided by the fluoroscopic device): 29.70 mGy Kerma COMPARISON:  CT chest, 04/15/2024.  Esophagram, 07/13/2024. FINDINGS: Swallowing: Appears normal. No vestibular penetration or aspiration seen. Pharynx: Unremarkable. Esophagus: Esophageal diverticula, within the mid thoracic esophagus are present, as noted on prior comparison. Esophageal motility: Moderate dysmotility, with tortuous esophagus, tertiary contractions, and delayed emptying at GE junction with narrow lumen across fundoplication site. Hiatal Hernia: None.  Postsurgical changes of fundoplication Gastroesophageal reflux: None visualized. Ingested 13mm barium tablet: Not given Other: None. IMPRESSION: 1. Postsurgical changes of fundoplication, with relative narrow lumen across the gastroesophageal junction. 2. No extraluminal extravasation of ingested contrast. 3. Moderate esophageal dysmotility, with midthoracic esophageal diverticulae and tertiary contractions. No fluoroscopic evidence of gastroesophageal reflux. Electronically Signed   By: Thom Hall M.D.   On: 07/29/2024 13:00   DG Chest 1 View Result Date:  07/29/2024 CLINICAL DATA:  Status post hiatal hernia repair. EXAM: CHEST  1 VIEW COMPARISON:  July 26, 2024. FINDINGS: Pneumomediastinum is noted consistent with a history of hiatal hernia repair. Minimal right apical pneumothorax may be present, most likely related to pneumomediastinum and or postsurgical status. No significant consolidative process is noted. Bony thorax is unremarkable. IMPRESSION: Pneumomediastinum is noted most likely related to history of hiatal hernia repair. Possible minimal right apical pneumothorax may be present as well. Electronically Signed   By: Lynwood Landy Raddle M.D.   On: 07/29/2024 08:53   DG Chest 2 View Result Date: 07/26/2024 CLINICAL DATA:  Preoperative chest x-ray. Paraesophageal hernia repair. EXAM: CHEST - 2 VIEW COMPARISON:  04/15/2024 FINDINGS: The cardiac silhouette, mediastinal and hilar contours are normal. The lungs are clear. No acute pulmonary process or pulmonary lesions. The bony thorax is intact. IMPRESSION: No acute cardiopulmonary findings. Electronically Signed   By: MYRTIS Stammer M.D.   On: 07/26/2024 14:32   DG ESOPHAGUS W DOUBLE CM (HD) Result Date: 07/13/2024 CLINICAL DATA:  64 year old female. History of gastroesophageal reflux. Endorses dysphagia with solids and liquids. Patient presents for double esophagram for further evaluation EXAM: ESOPHAGUS/BARIUM SWALLOW/TABLET STUDY TECHNIQUE: Combined double and single contrast examination was performed using effervescent crystals, high-density barium, and thin liquid barium. This exam was performed by Delon Beagle NP, and was supervised and interpreted by Dr. Ree Molt FLUOROSCOPY: Radiation Exposure Index (as provided by the  fluoroscopic device): 22.00 mGy Kerma COMPARISON:  CT chest dated April 15, 2024 FINDINGS: Swallowing: Appears normal. No vestibular penetration or aspiration seen. Pharynx: Unremarkable. Esophagus: There is a wide necked approximately 6 x 8 mm diverticulum arising from the  left wall of middle third esophagus. Immediately inferior to it, there is another smaller approximately 3 x 6 mm similar characteristic wide neck diverticulum. There is mucosal nodularity of the esophagus on double contrast views, favoring esophagitis. Esophageal motility: Within normal limits. Hiatal Hernia: None. Gastroesophageal reflux: Gastroesophageal reflux into the lower third of the esophagus Ingested 13mm barium tablet: Passed normally Other: None. IMPRESSION: 1. Findings favoring esophagitis. 2. There are 2 adjacent wide necked midthoracic esophageal diverticula, as described above. 3. Gastroesophageal reflux noted to the lower third of the esophagus. 4. No mass, hernia or significant dysmotility. Electronically Signed   By: Ree Molt M.D.   On: 07/13/2024 11:38   Treatment: Robotic assisted paraesophageal hernia repair with fundoplication and EGD - Dr. Lightfoot      Discharge Medications: Allergies as of 07/31/2024       Reactions   Codeine Shortness Of Breath, Nausea And Vomiting   Aspirin Nausea Only        Medication List     STOP taking these medications    acetaminophen  500 MG tablet Commonly known as: TYLENOL        TAKE these medications    Airsupra 90-80 MCG/ACT Aero Generic drug: Albuterol-Budesonide Inhale 1-2 puffs into the lungs every 6 (six) hours as needed (wheezing/shortness of breath).   atorvastatin  20 MG tablet Commonly known as: LIPITOR Take 20 mg by mouth at bedtime.   Breo Ellipta 100-25 MCG/ACT Aepb Generic drug: fluticasone furoate-vilanterol Inhale 1 puff into the lungs daily.   budesonide-formoterol 80-4.5 MCG/ACT inhaler Commonly known as: SYMBICORT Inhale 1 puff into the lungs 2 (two) times daily.   famotidine  40 MG tablet Commonly known as: PEPCID  Take 40 mg by mouth at bedtime.   glipiZIDE 5 MG tablet Commonly known as: GLUCOTROL Take 5 mg by mouth 2 (two) times daily before a meal.   hydrochlorothiazide  25 MG  tablet Commonly known as: HYDRODIURIL  Take 25 mg by mouth in the morning.   HYDROcodone -acetaminophen  5-325 MG tablet Commonly known as: NORCO/VICODIN Take 1 tablet by mouth every 6 (six) hours as needed for severe pain (pain score 7-10). CRUSH MEDICATION AND TAKE IN APPLESAUCE   hydroquinone 4 % cream Apply 1 application  topically every other day.   pantoprazole  40 MG tablet Commonly known as: PROTONIX  TAKE 1 TABLET BY MOUTH TWICE DAILY BEFORE MEALS   VITAMIN D-3 PO Take 1 tablet by mouth every evening.               Durable Medical Equipment  (From admission, onward)           Start     Ordered   07/31/24 1225  For home use only DME Walker rolling  Once       Question Answer Comment  Walker: With 5 Inch Wheels   Patient needs a walker to treat with the following condition Physical deconditioning   Patient needs a walker to treat with the following condition S/P hernia repair      07/31/24 1225            Follow Up Appointments:  Follow-up Information     Shyrl Linnie KIDD, MD Follow up on 08/12/2024.   Specialty: Cardiothoracic Surgery Why: Follow up appointment is at 11:40AM Contact information:  [address] [place] [address] [phone]                 Signed: Con GORMAN Raguel DEVONNA 07/31/2024, 4:58 PM

## 2024-07-28 NOTE — Transfer of Care (Signed)
 Immediate Anesthesia Transfer of Care Note  Patient: Rhonda Patton  Procedure(s) Performed: REPAIR, HERNIA, PARAESOPHAGEAL, ROBOT-ASSISTED (Chest) EGD (ESOPHAGOGASTRODUODENOSCOPY) LYSIS, ADHESIONS, ROBOT-ASSISTED, LAPAROSCOPIC (Abdomen)  Patient Location: PACU  Anesthesia Type:General  Level of Consciousness: drowsy  Airway & Oxygen Therapy: Patient Spontanous Breathing and Patient connected to face mask oxygen  Post-op Assessment: Report given to RN and Post -op Vital signs reviewed and stable  Post vital signs: Reviewed and stable  Last Vitals:  Vitals Value Taken Time  BP 100/58 07/28/24 10:54  Temp    Pulse 76 07/28/24 10:56  Resp 21 07/28/24 10:56  SpO2 96 % 07/28/24 10:56  Vitals shown include unfiled device data.  Last Pain:  Vitals:   07/28/24 0628  PainSc: 7       Patients Stated Pain Goal: 3 (07/28/24 9371)  Complications: No notable events documented.

## 2024-07-28 NOTE — Plan of Care (Signed)
  Problem: Education: Goal: Knowledge of General Education information will improve Description: Including pain rating scale, medication(s)/side effects and non-pharmacologic comfort measures Outcome: Progressing   Problem: Clinical Measurements: Goal: Ability to maintain clinical measurements within normal limits will improve Outcome: Progressing Goal: Cardiovascular complication will be avoided Outcome: Progressing   Problem: Pain Managment: Goal: General experience of comfort will improve and/or be controlled Outcome: Progressing

## 2024-07-28 NOTE — Anesthesia Preprocedure Evaluation (Signed)
 Anesthesia Evaluation  Patient identified by MRN, date of birth, ID band Patient awake    Reviewed: Allergy & Precautions, NPO status , Patient's Chart, lab work & pertinent test results  History of Anesthesia Complications (+) PONV and history of anesthetic complications  Airway Mallampati: I  TM Distance: >3 FB Neck ROM: Full    Dental  (+) Teeth Intact, Dental Advisory Given   Pulmonary neg shortness of breath, asthma , neg sleep apnea, neg COPD, neg recent URI   breath sounds clear to auscultation       Cardiovascular hypertension, Pt. on medications (-) angina + DOE  (-) Past MI and (-) CHF  Rhythm:Regular  1. Left ventricular ejection fraction, by estimation, is 60 to 65%. The  left ventricle has normal function. The left ventricle has no regional  wall motion abnormalities. There is mild concentric left ventricular  hypertrophy. Left ventricular diastolic  parameters are indeterminate.   2. Right ventricular systolic function is normal. The right ventricular  size is normal. Tricuspid regurgitation signal is inadequate for assessing  PA pressure.   3. The mitral valve is grossly normal. Trivial mitral valve  regurgitation.   4. The aortic valve is tricuspid. Aortic valve regurgitation is mild.  Aortic valve sclerosis is present, with no evidence of aortic valve  stenosis. Aortic valve mean gradient measures 5.0 mmHg.   5. The inferior vena cava is normal in size with greater than 50%  respiratory variability, suggesting right atrial pressure of 3 mmHg.     Neuro/Psych  Headaches, neg Seizures  Neuromuscular disease    GI/Hepatic hiatal hernia,GERD  Medicated,,  Endo/Other  diabetes, Type 2    Renal/GU Renal InsufficiencyRenal disease     Musculoskeletal   Abdominal   Peds  Hematology Lab Results      Component                Value               Date                      WBC                      4.3                  07/26/2024                HGB                      13.5                07/26/2024                HCT                      41.7                07/26/2024                MCV                      87.2                07/26/2024                PLT  209                 07/26/2024              Anesthesia Other Findings   Reproductive/Obstetrics                              Anesthesia Physical Anesthesia Plan  ASA: 2  Anesthesia Plan: General   Post-op Pain Management: Ofirmev  IV (intra-op)* and Ketamine IV*   Induction: Intravenous  PONV Risk Score and Plan: 4 or greater and Ondansetron , Dexamethasone , Propofol  infusion and TIVA  Airway Management Planned: Oral ETT  Additional Equipment: ClearSight  Intra-op Plan:   Post-operative Plan: Extubation in OR  Informed Consent: I have reviewed the patients History and Physical, chart, labs and discussed the procedure including the risks, benefits and alternatives for the proposed anesthesia with the patient or authorized representative who has indicated his/her understanding and acceptance.     Dental advisory given  Plan Discussed with: CRNA  Anesthesia Plan Comments:          Anesthesia Quick Evaluation

## 2024-07-29 ENCOUNTER — Observation Stay (HOSPITAL_COMMUNITY)

## 2024-07-29 ENCOUNTER — Encounter (HOSPITAL_COMMUNITY): Payer: Self-pay | Admitting: Thoracic Surgery (Cardiothoracic Vascular Surgery)

## 2024-07-29 DIAGNOSIS — Z4682 Encounter for fitting and adjustment of non-vascular catheter: Secondary | ICD-10-CM | POA: Diagnosis not present

## 2024-07-29 DIAGNOSIS — K225 Diverticulum of esophagus, acquired: Secondary | ICD-10-CM | POA: Diagnosis not present

## 2024-07-29 DIAGNOSIS — J982 Interstitial emphysema: Secondary | ICD-10-CM | POA: Diagnosis not present

## 2024-07-29 DIAGNOSIS — K449 Diaphragmatic hernia without obstruction or gangrene: Secondary | ICD-10-CM | POA: Diagnosis not present

## 2024-07-29 DIAGNOSIS — K224 Dyskinesia of esophagus: Secondary | ICD-10-CM | POA: Diagnosis not present

## 2024-07-29 DIAGNOSIS — R918 Other nonspecific abnormal finding of lung field: Secondary | ICD-10-CM | POA: Diagnosis not present

## 2024-07-29 LAB — BASIC METABOLIC PANEL WITH GFR
Anion gap: 13 (ref 5–15)
BUN: 11 mg/dL (ref 8–23)
CO2: 23 mmol/L (ref 22–32)
Calcium: 8.9 mg/dL (ref 8.9–10.3)
Chloride: 102 mmol/L (ref 98–111)
Creatinine, Ser: 1.02 mg/dL — ABNORMAL HIGH (ref 0.44–1.00)
GFR, Estimated: >60 mL/min (ref >60)
Glucose, Bld: 134 mg/dL — ABNORMAL HIGH (ref 70–99)
Potassium: 3.6 mmol/L (ref 3.5–5.1)
Sodium: 138 mmol/L (ref 135–145)

## 2024-07-29 LAB — GLUCOSE, CAPILLARY
Glucose-Capillary: 112 mg/dL — ABNORMAL HIGH (ref 70–99)
Glucose-Capillary: 138 mg/dL — ABNORMAL HIGH (ref 70–99)
Glucose-Capillary: 140 mg/dL — ABNORMAL HIGH (ref 70–99)
Glucose-Capillary: 166 mg/dL — ABNORMAL HIGH (ref 70–99)

## 2024-07-29 LAB — CBC
HCT: 39.5 % (ref 36.0–46.0)
Hemoglobin: 12.9 g/dL (ref 12.0–15.0)
MCH: 28.3 pg (ref 26.0–34.0)
MCHC: 32.7 g/dL (ref 30.0–36.0)
MCV: 86.6 fL (ref 80.0–100.0)
Platelets: 180 K/uL (ref 150–400)
RBC: 4.56 MIL/uL (ref 3.87–5.11)
RDW: 14 % (ref 11.5–15.5)
WBC: 10.5 K/uL (ref 4.0–10.5)
nRBC: 0 % (ref 0.0–0.2)

## 2024-07-29 MED ORDER — IOHEXOL 300 MG/ML  SOLN: 100.0000 mL | Freq: Once | INTRAMUSCULAR | Status: DC | PRN

## 2024-07-29 NOTE — Plan of Care (Signed)

## 2024-07-29 NOTE — TOC CM/SW Note (Signed)
 Transition of Care [facility]) - Inpatient Brief Assessment   Patient Details  Name: Rhonda Patton MRN: [id number] Date of Birth: 08/31/1961  Transition of Care [facility]) CM/SW Contact:    Lauraine FORBES Saa, LCSWA Phone Number: 07/29/2024, 8:42 AM   Clinical Narrative:  8:43 AM Per chart review, patient resides at home with spouse. Patient has a PCP and insurance. Patient has SNF history with [facility]. Patient has HH history with [facility]. Patient has DME (BSC, crutches, shower chair, RW, walker) history with [facility]. Patient's preferred pharmacy is [facility] [address]. No TOC needs identified at this time. TOC will continue to follow.  Transition of Care Asessment: Insurance and Status: Insurance coverage has been reviewed Patient has primary care physician: Yes Home environment has been reviewed: Private Residence Prior level of function:: N/A Prior/Current Home Services: No current home services Social Drivers of Health Review: SDOH reviewed no interventions necessary Readmission risk has been reviewed: Yes (Currently Green 10%) Transition of care needs: no transition of care needs at this time

## 2024-07-29 NOTE — Progress Notes (Addendum)
 1 Day Post-Op Procedure(s) (LRB): REPAIR, HERNIA, PARAESOPHAGEAL, ROBOT-ASSISTED (N/A) EGD (ESOPHAGOGASTRODUODENOSCOPY) (N/A) LYSIS, ADHESIONS, ROBOT-ASSISTED, LAPAROSCOPIC (N/A) Subjective: Some pain, good relief w/ meds  Objective: Vital signs in last 24 hours: Temp:  [97.2 F (36.2 C)-99.2 F (37.3 C)] 99.2 F (37.3 C) (10/10 0405) Pulse Rate:  [59-76] 66 (10/09 2256) Cardiac Rhythm: Normal sinus rhythm (10/09 1900) Resp:  [10-21] 16 (10/10 0600) BP: (94-143)/(50-75) 133/71 (10/10 0600) SpO2:  [93 %-98 %] 97 % (10/10 0600)  Hemodynamic parameters for last 24 hours:    Intake/Output from previous day: 10/09 0701 - 10/10 0700 In: 1500 [I.V.:700; IV Piggyback:800] Out: 377 [Urine:377] Intake/Output this shift: No intake/output data recorded.  General appearance: alert, cooperative, fatigued, and no distress Heart: regular rate and rhythm Lungs: clear to auscultation bilaterally Abdomen: + BS, soft, non distended Extremities: no edema or calf tenderness Wound: incis healing well  Lab Results: Recent Labs    07/28/24 1344 07/29/24 0238  WBC 8.4 10.5  HGB 12.9 12.9  HCT 39.1 39.5  PLT 189 180   BMET:  Recent Labs    07/26/24 1308 07/28/24 1344 07/29/24 0238  NA 141  --  138  K 3.6  --  3.6  CL 106  --  102  CO2 25  --  23  GLUCOSE 105*  --  134*  BUN 11  --  11  CREATININE 1.11* 1.09* 1.02*  CALCIUM  9.3  --  8.9    PT/INR:  Recent Labs    07/26/24 1308  LABPROT 14.0  INR 1.0   ABG    Component Value Date/Time   HCO3 22.4 04/18/2021 1630   ACIDBASEDEF 2.7 (H) 04/18/2021 1630   O2SAT 90.8 04/18/2021 1630   CBG (last 3)  Recent Labs    07/28/24 1605 07/28/24 2104 07/29/24 0623  GLUCAP 152* 152* 112*    Meds Scheduled Meds:  enoxaparin (LOVENOX) injection  40 mg Subcutaneous Q24H   fluticasone furoate-vilanterol  1 puff Inhalation Daily   insulin  aspart  0-15 Units Subcutaneous TID WC   insulin  aspart  0-5 Units Subcutaneous QHS    pantoprazole  (PROTONIX ) IV  40 mg Intravenous QHS   Continuous Infusions: PRN Meds:.albuterol, HYDROmorphone  (DILAUDID ) injection, [COMPLETED] ketorolac  **FOLLOWED BY** ketorolac , ondansetron  **OR** [DISCONTINUED] ondansetron  (ZOFRAN ) IV, prochlorperazine  Xrays No results found.  Assessment/Plan: S/P Procedure(s) (LRB): REPAIR, HERNIA, PARAESOPHAGEAL, ROBOT-ASSISTED (N/A) EGD (ESOPHAGOGASTRODUODENOSCOPY) (N/A) LYSIS, ADHESIONS, ROBOT-ASSISTED, LAPAROSCOPIC (N/A) POD#1  1 afeb, VSS, sinus rhythm 2 O2 sats good on 2 liters and RA at times, will get IS, encourage pulm hygiene and mobilize when able 3 UOP- poor, uncertain if all recorded, creat ok at 1,02, GFR >60 4 BS adeq control 5 not anemic 6 swallow study this am, CXR pending  Poss d/c later today if swallow ok and able to mobilize reasonably well    LOS: 1 day    Rhonda FORBES Cera PA-C Pager [phone] 07/29/2024   Agree Dys 1 diet Home tomorrow  Rhonda Patton

## 2024-07-30 DIAGNOSIS — K449 Diaphragmatic hernia without obstruction or gangrene: Secondary | ICD-10-CM | POA: Diagnosis not present

## 2024-07-30 LAB — BASIC METABOLIC PANEL WITH GFR
Anion gap: 9 (ref 5–15)
BUN: 15 mg/dL (ref 8–23)
CO2: 23 mmol/L (ref 22–32)
Calcium: 8.7 mg/dL — ABNORMAL LOW (ref 8.9–10.3)
Chloride: 104 mmol/L (ref 98–111)
Creatinine, Ser: 1.19 mg/dL — ABNORMAL HIGH (ref 0.44–1.00)
GFR, Estimated: 51 mL/min — ABNORMAL LOW (ref >60)
Glucose, Bld: 170 mg/dL — ABNORMAL HIGH (ref 70–99)
Potassium: 3.9 mmol/L (ref 3.5–5.1)
Sodium: 136 mmol/L (ref 135–145)

## 2024-07-30 LAB — GLUCOSE, CAPILLARY
Glucose-Capillary: 133 mg/dL — ABNORMAL HIGH (ref 70–99)
Glucose-Capillary: 145 mg/dL — ABNORMAL HIGH (ref 70–99)
Glucose-Capillary: 161 mg/dL — ABNORMAL HIGH (ref 70–99)
Glucose-Capillary: 104 mg/dL — ABNORMAL HIGH (ref 70–99)

## 2024-07-30 LAB — CBC
HCT: 38.6 % (ref 36.0–46.0)
Hemoglobin: 12.8 g/dL (ref 12.0–15.0)
MCH: 28.4 pg (ref 26.0–34.0)
MCHC: 33.2 g/dL (ref 30.0–36.0)
MCV: 85.8 fL (ref 80.0–100.0)
Platelets: 155 K/uL (ref 150–400)
RBC: 4.5 MIL/uL (ref 3.87–5.11)
RDW: 14.6 % (ref 11.5–15.5)
WBC: 12.9 K/uL — ABNORMAL HIGH (ref 4.0–10.5)
nRBC: 0 % (ref 0.0–0.2)

## 2024-07-30 LAB — URINALYSIS, COMPLETE (UACMP) WITH MICROSCOPIC
Bilirubin Urine: NEGATIVE
Glucose, UA: NEGATIVE mg/dL
Hgb urine dipstick: NEGATIVE
Ketones, ur: NEGATIVE mg/dL
Leukocytes,Ua: NEGATIVE
Nitrite: NEGATIVE
Protein, ur: 100 mg/dL — AB
Specific Gravity, Urine: 1.041 — ABNORMAL HIGH (ref 1.005–1.030)
pH: 5 (ref 5.0–8.0)

## 2024-07-30 MED ORDER — HYDROMORPHONE HCL 1 MG/ML IJ SOLN: 1.0000 mg | INTRAMUSCULAR | Status: DC | PRN

## 2024-07-30 MED ORDER — PANTOPRAZOLE SODIUM 40 MG PO TBEC
40.0000 mg | DELAYED_RELEASE_TABLET | Freq: Every day | ORAL | Status: DC
Start: 2024-07-30 — End: 2024-07-31
  Administered 2024-07-30: 40 mg via ORAL
  Filled 2024-07-30: qty 1

## 2024-07-30 MED ORDER — ACETAMINOPHEN 160 MG/5ML PO SOLN
650.0000 mg | Freq: Four times a day (QID) | ORAL | Status: DC | PRN
  Administered 2024-07-30: 650 mg via ORAL
  Filled 2024-07-30: qty 20.3

## 2024-07-30 MED ORDER — HYDROCODONE-ACETAMINOPHEN 7.5-325 MG/15ML PO SOLN
10.0000 mL | Freq: Four times a day (QID) | ORAL | Status: DC | PRN
Start: 2024-07-30 — End: 2024-07-31
  Administered 2024-07-30 – 2024-07-31 (×3): 10 mL via ORAL
  Filled 2024-07-30 (×3): qty 15

## 2024-07-30 MED ORDER — ACETAMINOPHEN 160 MG/5ML PO SOLN
650.0000 mg | Freq: Four times a day (QID) | ORAL | Status: DC
  Administered 2024-07-30 – 2024-07-31 (×3): 650 mg via ORAL
  Filled 2024-07-30 (×3): qty 20.3

## 2024-07-30 NOTE — Plan of Care (Signed)
   Problem: Education: Goal: Knowledge of General Education information will improve Description Including pain rating scale, medication(s)/side effects and non-pharmacologic comfort measures Outcome: Progressing

## 2024-07-30 NOTE — Progress Notes (Addendum)
 [address] [employer] [address]             [phone]      2 Days Post-Op Procedure(s) (LRB): REPAIR, HERNIA, PARAESOPHAGEAL, ROBOT-ASSISTED (N/A) EGD (ESOPHAGOGASTRODUODENOSCOPY) (N/A) LYSIS, ADHESIONS, ROBOT-ASSISTED, LAPAROSCOPIC (N/A) Subjective: Patient reports she is still not doing very well. She is still having pain and hasn't gotten up and walked at all yesterday or today.   Objective: Vital signs in last 24 hours: Temp:  [98 F (36.7 C)-101.1 F (38.4 C)] 98 F (36.7 C) (10/11 1102) Pulse Rate:  [68-88] 68 (10/11 1102) Cardiac Rhythm: Normal sinus rhythm (10/11 0800) Resp:  [18-20] 20 (10/11 1102) BP: (110-131)/(64-71) 110/69 (10/11 1102) SpO2:  [96 %-98 %] 97 % (10/11 1102)  Hemodynamic parameters for last 24 hours:    Intake/Output from previous day: 10/10 0701 - 10/11 0700 In: 240 [P.O.:240] Out: 850 [Urine:850] Intake/Output this shift: Total I/O In: 237 [P.O.:237] Out: -   General appearance: alert, cooperative, and no distress Neurologic: intact Heart: regular rate and rhythm, S1, S2 normal, no murmur, click, rub or gallop Lungs: diminished bibasilar breath sounds Abdomen: soft, non-tender; bowel sounds normal; no masses,  no organomegaly Extremities: extremities normal, atraumatic, no cyanosis or edema Wound: Clean and dry without sign of infection  Lab Results: Recent Labs    07/28/24 1344 07/29/24 0238  WBC 8.4 10.5  HGB 12.9 12.9  HCT 39.1 39.5  PLT 189 180   BMET:  Recent Labs    07/28/24 1344 07/29/24 0238  NA  --  138  K  --  3.6  CL  --  102  CO2  --  23  GLUCOSE  --  134*  BUN  --  11  CREATININE 1.09* 1.02*  CALCIUM   --  8.9    PT/INR: No results for input(s): LABPROT, INR in the last 72 hours. ABG    Component Value Date/Time   HCO3 22.4 04/18/2021 1630   ACIDBASEDEF 2.7 (H) 04/18/2021 1630   O2SAT 90.8 04/18/2021 1630   CBG (last 3)  Recent Labs    07/29/24 2145  07/30/24 0618 07/30/24 1100  GLUCAP 166* 133* 145*    Assessment/Plan: S/P Procedure(s) (LRB): REPAIR, HERNIA, PARAESOPHAGEAL, ROBOT-ASSISTED (N/A) EGD (ESOPHAGOGASTRODUODENOSCOPY) (N/A) LYSIS, ADHESIONS, ROBOT-ASSISTED, LAPAROSCOPIC (N/A)  Neuro: Still having pain but reports she gets very sleepy with dilaudid  which is why she didn't walk yesterday. Will d/c Dilaudid  and start liquid Hycet.   CV: Stable vital signs. Blood pressure controlled. NSR, HR 60s.   Pulm: Saturating well on 2L [place]. CXR yesterday with pneumomediastinum and possible trace right apical pneumothorax. Likely bibasilar atelectasis as well. No follow up CXR today, will check one tomorrow. No IS per Dr. Shyrl. Encourage ambulation. Wean O2 for SpO2>90%  GI: Tolerating a dysphagia 1 diet, no nausea or vomiting.   Endo: T2DM, Preop A1C 5.9. CBGs controlled on AC/HS SSI.   Renal: Cr 1.02 yesterday. UO 850cc/24hrs, slightly improved with PO liquid intake.   ID: No leukocytosis yesterday, no labs today. Tmax 101.1 overnight, now 98 after given Tylenol  at 5AM. No obvious sign of infection. Will check CBC.   DVT Prophylaxis: Lovenox  Dispo: Patient reports she did not get up at all yesterday or yet today. Reported she needed to walk 3 times today, also relayed this to nursing staff. Will order PT eval as well. Still on 2L [place], wean today.    LOS: 1 day  Con GORMAN Bend, PA-C 07/30/2024   Chart reviewed, patient examined, agree with above.  She had temp to 101.1 overnight but afebrile today. Sitting up in chair eating some dinner slowly. She says she ambulated this afternoon and will go again tonight.

## 2024-07-30 NOTE — Plan of Care (Signed)
  Problem: Education: Goal: Knowledge of General Education information will improve Description: Including pain rating scale, medication(s)/side effects and non-pharmacologic comfort measures Outcome: Progressing   Problem: Health Behavior/Discharge Planning: Goal: Ability to manage health-related needs will improve Outcome: Progressing   Problem: Clinical Measurements: Goal: Ability to maintain clinical measurements within normal limits will improve Outcome: Progressing Goal: Will remain free from infection Outcome: Progressing Goal: Diagnostic test results will improve Outcome: Progressing Goal: Respiratory complications will improve Outcome: Progressing Goal: Cardiovascular complication will be avoided Outcome: Progressing   Problem: Activity: Goal: Risk for activity intolerance will decrease Outcome: Not Progressing   Problem: Nutrition: Goal: Adequate nutrition will be maintained Outcome: Not Progressing   Problem: Coping: Goal: Level of anxiety will decrease Outcome: Not Progressing   Problem: Elimination: Goal: Will not experience complications related to bowel motility Outcome: Progressing Goal: Will not experience complications related to urinary retention Outcome: Not Progressing   Problem: Pain Managment: Goal: General experience of comfort will improve and/or be controlled Outcome: Not Progressing   Problem: Safety: Goal: Ability to remain free from injury will improve Outcome: Progressing   Problem: Skin Integrity: Goal: Risk for impaired skin integrity will decrease Outcome: Progressing   Problem: Education: Goal: Ability to describe self-care measures that may prevent or decrease complications (Diabetes Survival Skills Education) will improve Outcome: Progressing Goal: Individualized Educational Video(s) Outcome: Progressing   Problem: Coping: Goal: Ability to adjust to condition or change in health will improve Outcome: Progressing    Problem: Fluid Volume: Goal: Ability to maintain a balanced intake and output will improve Outcome: Progressing   Problem: Health Behavior/Discharge Planning: Goal: Ability to identify and utilize available resources and services will improve Outcome: Progressing Goal: Ability to manage health-related needs will improve Outcome: Progressing   Problem: Metabolic: Goal: Ability to maintain appropriate glucose levels will improve Outcome: Progressing   Problem: Nutritional: Goal: Maintenance of adequate nutrition will improve Outcome: Progressing Goal: Progress toward achieving an optimal weight will improve Outcome: Progressing   Problem: Skin Integrity: Goal: Risk for impaired skin integrity will decrease Outcome: Progressing   Problem: Tissue Perfusion: Goal: Adequacy of tissue perfusion will improve Outcome: Progressing

## 2024-07-30 NOTE — Progress Notes (Signed)
 Pt ambulated halls with RN. Pt c/o some nausea, dry heaving.  Pt recovered after sitting down and resting.

## 2024-07-30 NOTE — Progress Notes (Signed)
 Pt ambulated halls with assistance, no complaints.

## 2024-07-30 NOTE — Progress Notes (Signed)
 Pt refusing to ambulate in halls this am. Educated about importance of walking post op. Will try again.

## 2024-07-30 NOTE — Progress Notes (Signed)
 Pt refusing to ambulate halls but saying she is agreeable to getting up to chair after lunch. PA notified.

## 2024-07-31 ENCOUNTER — Other Ambulatory Visit (HOSPITAL_COMMUNITY): Payer: Self-pay

## 2024-07-31 ENCOUNTER — Observation Stay (HOSPITAL_COMMUNITY)

## 2024-07-31 DIAGNOSIS — I10 Essential (primary) hypertension: Secondary | ICD-10-CM | POA: Diagnosis not present

## 2024-07-31 DIAGNOSIS — Z9889 Other specified postprocedural states: Secondary | ICD-10-CM | POA: Diagnosis not present

## 2024-07-31 DIAGNOSIS — J45909 Unspecified asthma, uncomplicated: Secondary | ICD-10-CM | POA: Diagnosis not present

## 2024-07-31 DIAGNOSIS — K449 Diaphragmatic hernia without obstruction or gangrene: Secondary | ICD-10-CM | POA: Diagnosis not present

## 2024-07-31 DIAGNOSIS — R0989 Other specified symptoms and signs involving the circulatory and respiratory systems: Secondary | ICD-10-CM | POA: Diagnosis not present

## 2024-07-31 DIAGNOSIS — E119 Type 2 diabetes mellitus without complications: Secondary | ICD-10-CM | POA: Diagnosis not present

## 2024-07-31 DIAGNOSIS — Z79899 Other long term (current) drug therapy: Secondary | ICD-10-CM | POA: Diagnosis not present

## 2024-07-31 LAB — CBC
HCT: 36.6 % (ref 36.0–46.0)
Hemoglobin: 12.2 g/dL (ref 12.0–15.0)
MCH: 28.5 pg (ref 26.0–34.0)
MCHC: 33.3 g/dL (ref 30.0–36.0)
MCV: 85.5 fL (ref 80.0–100.0)
Platelets: 164 K/uL (ref 150–400)
RBC: 4.28 MIL/uL (ref 3.87–5.11)
RDW: 14.3 % (ref 11.5–15.5)
WBC: 9.4 K/uL (ref 4.0–10.5)
nRBC: 0 % (ref 0.0–0.2)

## 2024-07-31 LAB — GLUCOSE, CAPILLARY
Glucose-Capillary: 139 mg/dL — ABNORMAL HIGH (ref 70–99)
Glucose-Capillary: 155 mg/dL — ABNORMAL HIGH (ref 70–99)

## 2024-07-31 LAB — BASIC METABOLIC PANEL WITH GFR
Anion gap: 10 (ref 5–15)
BUN: 14 mg/dL (ref 8–23)
CO2: 24 mmol/L (ref 22–32)
Calcium: 8.7 mg/dL — ABNORMAL LOW (ref 8.9–10.3)
Chloride: 104 mmol/L (ref 98–111)
Creatinine, Ser: 0.92 mg/dL (ref 0.44–1.00)
GFR, Estimated: >60 mL/min (ref >60)
Glucose, Bld: 128 mg/dL — ABNORMAL HIGH (ref 70–99)
Potassium: 3.4 mmol/L — ABNORMAL LOW (ref 3.5–5.1)
Sodium: 138 mmol/L (ref 135–145)

## 2024-07-31 MED ORDER — HYDROCODONE-ACETAMINOPHEN 7.5-325 MG/15ML PO SOLN: 10.0000 mL | Freq: Four times a day (QID) | ORAL | 0 refills | Status: DC | PRN

## 2024-07-31 MED ORDER — HYDROCODONE-ACETAMINOPHEN 5-325 MG PO TABS: 1.0000 | ORAL_TABLET | Freq: Four times a day (QID) | ORAL | 0 refills | Status: DC | PRN

## 2024-07-31 MED ORDER — HYDROCODONE-ACETAMINOPHEN 7.5-325 MG/15ML PO SOLN
10.0000 mL | Freq: Four times a day (QID) | ORAL | 0 refills | Status: DC | PRN
  Filled 2024-07-31: qty 280, 7d supply, fill #0

## 2024-07-31 MED ORDER — POTASSIUM CHLORIDE CRYS ER 20 MEQ PO TBCR
40.0000 meq | EXTENDED_RELEASE_TABLET | Freq: Once | ORAL | Status: AC
Start: 2024-07-31 — End: 2024-07-31
  Administered 2024-07-31: 40 meq via ORAL
  Filled 2024-07-31: qty 2

## 2024-07-31 NOTE — Progress Notes (Signed)
      [address] [place] [address]             [phone]    Patient will require rolling walker at discharge for deconditioning and s/p diaphragmatic hernia repair. She has been using a rolling walker for ambulation while in the hospital. PT note is incorrect and the patient does not have a rolling walker at home and would benefit from one to assist with ambulation at home.   Con GORMAN Bend, PA-C 07/31/24

## 2024-07-31 NOTE — Progress Notes (Addendum)
      [address] [employer] [address]             [phone]      3 Days Post-Op Procedure(s) (LRB): REPAIR, HERNIA, PARAESOPHAGEAL, ROBOT-ASSISTED (N/A) EGD (ESOPHAGOGASTRODUODENOSCOPY) (N/A) LYSIS, ADHESIONS, ROBOT-ASSISTED, LAPAROSCOPIC (N/A) Subjective: Patient reports she is sleepy today and her pain is a 6 but it seems better than yesterday  Objective: Vital signs in last 24 hours: Temp:  [98 F (36.7 C)-98.5 F (36.9 C)] 98.4 F (36.9 C) (10/12 0740) Pulse Rate:  [67-83] 83 (10/12 0740) Cardiac Rhythm: Normal sinus rhythm (10/12 0700) Resp:  [17-20] 20 (10/12 0415) BP: (98-132)/(63-71) 98/63 (10/12 0740) SpO2:  [93 %-97 %] 93 % (10/12 0415)  Hemodynamic parameters for last 24 hours:    Intake/Output from previous day: 10/11 0701 - 10/12 0700 In: 1077 [P.O.:1077] Out: 100 [Urine:100] Intake/Output this shift: Total I/O In: 120 [P.O.:120] Out: -   General appearance: alert, cooperative, and no distress Neurologic: intact Heart: regular rate and rhythm, S1, S2 normal, no murmur, click, rub or gallop Lungs: clear to auscultation bilaterally Abdomen: soft, non-tender; bowel sounds normal; no masses,  no organomegaly Extremities: extremities normal, atraumatic, no cyanosis or edema Wound: Clean and dry without sign of infection  Lab Results: Recent Labs    07/30/24 1405 07/31/24 0314  WBC 12.9* 9.4  HGB 12.8 12.2  HCT 38.6 36.6  PLT 155 164   BMET:  Recent Labs    07/30/24 1405 07/31/24 0314  NA 136 138  K 3.9 3.4*  CL 104 104  CO2 23 24  GLUCOSE 170* 128*  BUN 15 14  CREATININE 1.19* 0.92  CALCIUM  8.7* 8.7*    PT/INR: No results for input(s): LABPROT, INR in the last 72 hours. ABG    Component Value Date/Time   HCO3 22.4 04/18/2021 1630   ACIDBASEDEF 2.7 (H) 04/18/2021 1630   O2SAT 90.8 04/18/2021 1630   CBG (last 3)  Recent Labs    07/30/24 1605 07/30/24 2125 07/31/24 0617  GLUCAP 161* 104* 139*     Assessment/Plan: S/P Procedure(s) (LRB): REPAIR, HERNIA, PARAESOPHAGEAL, ROBOT-ASSISTED (N/A) EGD (ESOPHAGOGASTRODUODENOSCOPY) (N/A) LYSIS, ADHESIONS, ROBOT-ASSISTED, LAPAROSCOPIC (N/A)  Neuro: Pain improved, will continue Hycet at discharge   CV: Stable vital signs. Blood pressure controlled. NSR, HR 70s-80s.    Pulm: Saturating well on RA. CXR today with low lung volumes, no pneumothorax seen. No IS per Dr. Shyrl. Encourage ambulation.    GI: Tolerating a dysphagia 1 diet, no nausea or vomiting.    Endo: T2DM, Preop A1C 5.9. CBGs controlled on AC/HS SSI.    Renal: Cr 0.92, improved when Toradol  was discontinued. Only 100cc/24hrs UO recorded but patient has been using the toilet independently   ID: Leukocytosis resolved, WBC 9.4 today. Has been afebrile for over 24 hours. No obvious sign of infection.   DVT Prophylaxis: Lovenox  Deconditioning: PT recommends outpatient PT. Patient's husband and child will be at home to help out. Will order RW.   Dispo: Plan to d/c home today with husband.    LOS: 1 day    Con GORMAN Bend, PA-C 07/31/2024   Chart reviewed, patient examined, agree with above.  She is ambulating and on RA. Tolerating diet. Plan home today.

## 2024-07-31 NOTE — Progress Notes (Signed)
 Patient has gotten up to ambulate and use the bathroom. With walker and assistance, she was steady.

## 2024-07-31 NOTE — TOC Transition Note (Addendum)
 Transition of Care [facility]) - Discharge Note   Patient Details  Name: Rhonda Patton MRN: [id number] Date of Birth: 02/15/1961  Transition of Care [facility]) CM/SW Contact:  Robynn Eileen Hoose, RN Phone Number: 07/31/2024, 9:12 AM   Clinical Narrative:  Patient is being discharged today. Orders for [facility] PT noted, pt has history of using HH services with [facility] in the past. Referral to [facility] with [facility] and also placed in Epic portal for [facility]. Awaiting response.  0915: [facility] declined at this time.  Referral sent to [facility] in Epic Portal. Awaiting response.  [id number] Bong with [facility] declined HH referral at this time.  [employer] order noted and sent to [employer] through Epic portal.  1233: Phone call to [employer] to follow up on delivery of DME equipment. Per Charlies, unable to see signed order on her end and also request that provider write a narrative on why DME is needed. Provider updated on need. DME order emailed to [employer] at [email or web address].   1500: Secure message from floor nurse regarding RW delivery ETA. Call to [employer] to follow up on DME delivery.  [employer] will send info to logistics and they will call back with an ETA.             Patient Goals and CMS Choice            Discharge Placement                       Discharge Plan and Services Additional resources added to the After Visit Summary for                                       Social Drivers of Health (SDOH) Interventions SDOH Screenings   Food Insecurity: No Food Insecurity (07/28/2024)  Housing: Low Risk  (07/28/2024)  Transportation Needs: No Transportation Needs (07/28/2024)  Utilities: Not At Risk (07/28/2024)  Depression (PHQ2-9): Low Risk  (06/22/2024)  Financial Resource Strain: Low Risk  (06/14/2024)   Received from [facility]  Physical Activity: Insufficiently Active (06/14/2024)   Received from [facility]  Social Connections: Socially Integrated  (07/28/2024)  Stress: No Stress Concern Present (06/14/2024)   Received from [facility]  Tobacco Use: Low Risk  (07/28/2024)  Health Literacy: Low Risk  (06/14/2024)   Received from [facility]     Readmission Risk Interventions     No data to display

## 2024-07-31 NOTE — Evaluation (Signed)
 Physical Therapy Evaluation and Discharge Patient Details Name: Rhonda Patton MRN: [id number] DOB: 02/07/1961 Today's Date: 07/31/2024  History of Present Illness  63 y.o. female admitted 10/9 and underwent paraesophageal hernia repair (Robot assisted,) EGD, and lysis of adhesions. PMH: Arthritis, Barrett's esophagus, Carpal tunnel syndrome of right wrist, Chronic pain, Diabetes, GERD (gastroesophageal reflux disease), Hypercholesteremia, Hypertension, PONV (postoperative nausea and vomiting), Sleep apnea.    Clinical Impression  Patient evaluated by Physical Therapy with no further acute PT needs identified. All education has been completed and the patient has no further questions. Mod I with bed mobility and transfers. RW for very light support, ambulating 110 feet at a supervision level this morning SpO2 91% and greater on RA, HR to 112 max. Mild dyspnea. Educated on frequent mobility at home, symptom awareness, and safety with activity modifications as needed. States she has RW, BSC, and SPC. Would benefit from OPPT follow-up to increase functional capacity after d/c. Requested RN provide incentive spirometer. See below for any follow-up Physical Therapy or equipment needs. PT is signing off. Thank you for this referral.         If plan is discharge home, recommend the following: Assist for transportation;Help with stairs or ramp for entrance   Can travel by private vehicle        Equipment Recommendations None recommended by PT  Recommendations for Other Services       Functional Status Assessment Patient has had a recent decline in their functional status and demonstrates the ability to make significant improvements in function in a reasonable and predictable amount of time.     Precautions / Restrictions Precautions Precautions: Fall Recall of Precautions/Restrictions: Intact Restrictions Weight Bearing Restrictions Per Provider Order: No      Mobility  Bed  Mobility Overal bed mobility: Modified Independent             General bed mobility comments: extra time, no assist.    Transfers Overall transfer level: Modified independent Equipment used: Rolling walker (2 wheels)               General transfer comment: stable upon rising, no physical assist. Performed from bed and toilet. Did not immediately require hand placement on RW.    Ambulation/Gait Ambulation/Gait assistance: Supervision Gait Distance (Feet): 110 Feet Assistive device: Rolling walker (2 wheels) Gait Pattern/deviations: Step-through pattern, Decreased stride length Gait velocity: dec Gait velocity interpretation: <1.8 ft/sec, indicate of risk for recurrent falls   General Gait Details: Grossly stable with very light UE support on RW. Slow and guarded without evidence of buckling or LOB. Supervision for safety, SpO2 91% and greater on RA throughout. HR to 112.  Stairs Stairs:  (Strength appears adequate to navigate steps with supervision. States husband and daughter available to assist at home if needed.)          Wheelchair Mobility     Tilt Bed    Modified Rankin (Stroke Patients Only)       Balance Overall balance assessment: Needs assistance Sitting-balance support: No upper extremity supported, Feet supported Sitting balance-Leahy Scale: Good     Standing balance support: No upper extremity supported, During functional activity Standing balance-Leahy Scale: Fair                               Pertinent Vitals/Pain Pain Assessment Pain Assessment: Faces Faces Pain Scale: Hurts little more Pain Location: abdomen Pain Descriptors / Indicators: Operative site guarding  Pain Intervention(s): Limited activity within patient's tolerance, Monitored during session, Repositioned    Home Living Family/patient expects to be discharged to:: Private residence Living Arrangements: Spouse/significant other Available Help at Discharge:  Family;Available 24 hours/day Type of Home: House Home Access: Stairs to enter Entrance Stairs-Rails: Doctor, general practice of Steps: 5   Home Layout: One level Home Equipment: Cane - single Librarian, academic (2 wheels);BSC/3in1      Prior Function Prior Level of Function : Independent/Modified Independent;Driving             Mobility Comments: Using a cane to ambulate; could ambulate in community 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     Extremity/Trunk Assessment   Upper Extremity Assessment Upper Extremity Assessment: Defer to OT evaluation    Lower Extremity Assessment Lower Extremity Assessment: Generalized weakness       Communication   Communication Communication: No apparent difficulties    Cognition Arousal: Alert Behavior During Therapy: WFL for tasks assessed/performed   PT - Cognitive impairments: No apparent impairments                         Following commands: Intact       Cueing Cueing Techniques: Verbal cues     General Comments General comments (skin integrity, edema, etc.): HR 80, BP 98/63 (MAP 74) 94% on RA.    Exercises General Exercises - Lower Extremity Ankle Circles/Pumps: AROM, Both, 10 reps, Seated   Assessment/Plan    PT Assessment All further PT needs can be met in the next venue of care  PT Problem List Decreased strength;Decreased activity tolerance;Decreased balance;Decreased mobility;Obesity;Pain       PT Treatment Interventions      PT Goals (Current goals can be found in the Care Plan section)  Acute Rehab PT Goals Patient Stated Goal: Go home PT Goal Formulation: All assessment and education complete, DC therapy    Frequency       Co-evaluation               AM-PAC PT 6 Clicks Mobility  Outcome Measure Help needed turning from your back to your side while in a flat bed without using bedrails?: None Help needed moving from lying on your back to sitting on the side of a  flat bed without using bedrails?: None Help needed moving to and from a bed to a chair (including a wheelchair)?: None Help needed standing up from a chair using your arms (e.g., wheelchair or bedside chair)?: None Help needed to walk in hospital room?: A Little Help needed climbing 3-5 steps with a railing? : A Little 6 Click Score: 22    End of Session   Activity Tolerance: Patient tolerated treatment well Patient left: in chair;with call bell/phone within reach Nurse Communication: Mobility status (Request IS) PT Visit Diagnosis: Unsteadiness on feet (R26.81);Other abnormalities of gait and mobility (R26.89);Pain;Muscle weakness (generalized) (M62.81) Pain - part of body:  (abdomen)    Time: 9163-9143 PT Time Calculation (min) (ACUTE ONLY): 20 min   Charges:   PT Evaluation $PT Eval Low Complexity: 1 Low   PT General Charges $$ ACUTE PT VISIT: 1 Visit         Leontine Roads, PT, DPT [facility]  Rehabilitation Services Physical Therapist Office: [phone] Website: [email or web address]   Leontine GORMAN Roads 07/31/2024, 9:08 AM

## 2024-07-31 NOTE — Plan of Care (Signed)
   Problem: Education: Goal: Knowledge of General Education information will improve Description: Including pain rating scale, medication(s)/side effects and non-pharmacologic comfort measures Outcome: Progressing   Problem: Health Behavior/Discharge Planning: Goal: Ability to manage health-related needs will improve Outcome: Progressing   Problem: Activity: Goal: Risk for activity intolerance will decrease Outcome: Progressing

## 2024-08-03 NOTE — Progress Notes (Signed)
 Pt had esophagus and hernia surgery on the 9th since then she has been shob and coughing - when coughing it hurt her incisions - her rescue inhaler helps a little bit but not all the way   Called and informed pt of her labs - pt confirmed understanding

## 2024-08-03 NOTE — Telephone Encounter (Signed)
 Patient returning call to discuss lab results as well as questions about increasing symptoms of asthma post surgery. Please call patient [phone]

## 2024-08-04 NOTE — Anesthesia Postprocedure Evaluation (Signed)
 Anesthesia Post Note  Patient: Rhonda Patton  Procedure(s) Performed: REPAIR, HERNIA, PARAESOPHAGEAL, ROBOT-ASSISTED (Chest) EGD (ESOPHAGOGASTRODUODENOSCOPY) LYSIS, ADHESIONS, ROBOT-ASSISTED, LAPAROSCOPIC (Abdomen)     Patient location during evaluation: PACU Anesthesia Type: General Level of consciousness: awake and patient cooperative Pain management: pain level controlled Vital Signs Assessment: post-procedure vital signs reviewed and stable Respiratory status: spontaneous breathing, nonlabored ventilation, respiratory function stable and patient connected to nasal cannula oxygen Cardiovascular status: blood pressure returned to baseline and stable Postop Assessment: no apparent nausea or vomiting Anesthetic complications: no   No notable events documented.                  Marji Kuehnel

## 2024-08-05 ENCOUNTER — Ambulatory Visit: Payer: Self-pay

## 2024-08-05 NOTE — Telephone Encounter (Signed)
 Copied from CRM [id number]. Topic: Clinical - Lab/Test Results >> Aug 01, 2024  2:56 PM Benton O wrote: Reason for CRM:  patient is returning a call from Thursday she received  Was in hospital but now she Is out and needing to speak with who called her     ----------------------------------------------------------------------- From previous Reason for Contact - Other: Reason for CRM: patient is returning a call from Thursday she received  Was in hospital but now she Is out and needing to speak with who called her  See phone note

## 2024-08-05 NOTE — Telephone Encounter (Signed)
 FYI Only or Action Required?: FYI only for provider.  Patient is followed in Pulmonology for asthma, last seen on 07/25/2024 by Darlean Ozell NOVAK, MD.  Called Nurse Triage reporting Shortness of Breath.  Symptoms began a week ago.  Interventions attempted: Prescription medications: inhalers.  Symptoms are: gradually improving.  Triage Disposition: See PCP When Office is Open (Within 3 Days)  Patient/caregiver understands and will follow disposition?: Yes        Copied from CRM [id number]. Topic: Clinical - Red Word Triage >> Aug 05, 2024  2:54 PM Nathanel DEL wrote: Red Word that prompted transfer to Nurse Triage: SOB  since she came home from the hospital her breathing has gotten worse.  She called earlier this week, but never heard back. Reason for Disposition  [1] MODERATE longstanding difficulty breathing (e.g., speaks in phrases, SOB even at rest, pulse 100-120) AND [2] SAME as normal  Answer Assessment - Initial Assessment Questions Pt states she was discharged on Sunday from hospital for a hernia surgery. She states that she could barely walk through the house and she'd be gasping for breath. She states that it has gotten better since Sunday but that she called and someone said they'd call her back. She states the shortness of breath makes the cough bad. She's used her emergency inhaler, 2x  .     1. RESPIRATORY STATUS: Describe your breathing? (e.g., wheezing, shortness of breath, unable to speak, severe coughing)      She states she has to stop and breath out of her mouth when she gets up and moves 2. ONSET: When did this breathing problem begin?      Sunday 3. PATTERN Does the difficult breathing come and go, or has it been constant since it started?      Constant since Sunday but has gotten better 4. SEVERITY: How bad is your breathing? (e.g., mild, moderate, severe)      Better now 5. RECURRENT SYMPTOM: Have you had difficulty breathing before? If Yes, ask:  When was the last time? and What happened that time?       6. CARDIAC HISTORY: Do you have any history of heart disease? (e.g., heart attack, angina, bypass surgery, angioplasty)      HTN  7. LUNG HISTORY: Do you have any history of lung disease?  (e.g., pulmonary embolus, asthma, emphysema)     asthma 8. CAUSE: What do you think is causing the breathing problem?       9. OTHER SYMPTOMS: Do you have any other symptoms? (e.g., chest pain, cough, dizziness, fever, runny nose)     Cough, dizziness, tightness of chest is coming from operation 10. O2 SATURATION MONITOR:  Do you use an oxygen saturation monitor (pulse oximeter) at home? If Yes, ask: What is your reading (oxygen level) today? What is your usual oxygen saturation reading? (e.g., 95%)  Protocols used: Breathing Difficulty-A-AH

## 2024-08-08 ENCOUNTER — Encounter: Payer: Self-pay | Admitting: Internal Medicine

## 2024-08-08 ENCOUNTER — Ambulatory Visit: Admitting: Internal Medicine

## 2024-08-08 NOTE — Progress Notes (Deleted)
 Rhonda Patton, female    DOB: 06/16/1961    MRN: [id number]   Brief patient profile:  57  yobf  never smoker  referred to pulmonary clinic in [place]  07/25/2024 by Carliss Batty  for doe    Baseline wt 150 p last baby 1983    Sleep study done by Dr Shellia 2024 (nl study)  but not an established pt    History of Present Illness  07/25/2024  Pulmonary/ 1st office eval/ Rhonda / [place] [facility]  Chief Complaint  Patient presents with   Establish Care   Asthma    shob   Dyspnea:  onset was in 2024 and has improved on symbiocrt/air supra  Still able to walk at [employer] slow pace/ pushing cart 1-2 aisles  uses HC parking    Cough: better now  Sleep: bed is flat with a lot of pillows under head= baseline  SABA use: once a day  02: none  Has active HB with plans on 07/28/24 for [facility] repair  Rec Patient Instructions  Plan A = Automatic = Always=    Symbicort 80 Take 2 puffs first thing in am and then another 2 puffs about 12 hours later.  Work on inhaler technique:    Plan B = Backup (to supplement plan A, not to replace it) Use your albuterol inhaler as a rescue medication  Please schedule a follow up visit in 3 months but call sooner if needed PFTs on return     08/08/2024  ACUTE  ov/[place] office/Zakai Gonyea re: *** maint on ***  No chief complaint on file.   Dyspnea:  *** Cough: *** Sleeping: ***   resp cc  SABA use: *** 02: ***  Lung cancer screening: ***   No obvious day to day or daytime variability or assoc excess/ purulent sputum or mucus plugs or hemoptysis or cp or chest tightness, subjective wheeze or overt sinus or hb symptoms.    Also denies any obvious fluctuation of symptoms with weather or environmental changes or other aggravating or alleviating factors except as outlined above   No unusual exposure hx or h/o childhood pna/ asthma or knowledge of premature birth.  Current Allergies, Complete Past Medical History, Past Surgical History, Family  History, and Social History were reviewed in [employer] record.  ROS  The following are not active complaints unless bolded Hoarseness, sore throat, dysphagia, dental problems, itching, sneezing,  nasal congestion or discharge of excess mucus or purulent secretions, ear ache,   fever, chills, sweats, unintended wt loss or wt gain, classically pleuritic or exertional cp,  orthopnea pnd or arm/hand swelling  or leg swelling, presyncope, palpitations, abdominal pain, anorexia, nausea, vomiting, diarrhea  or change in bowel habits or change in bladder habits, change in stools or change in urine, dysuria, hematuria,  rash, arthralgias, visual complaints, headache, numbness, weakness or ataxia or problems with walking or coordination,  change in mood or  memory.        No outpatient medications have been marked as taking for the 08/08/24 encounter (Appointment) with Rhonda Ozell NOVAK, MD.        Past Medical History:  Diagnosis Date   Arthritis    Barrett's esophagus    Carpal tunnel syndrome of right wrist    Chronic pain    Diabetes (HCC)    GERD (gastroesophageal reflux disease)    Hypercholesteremia    Hypertension    PONV (postoperative nausea and vomiting)    Sleep apnea  diagnosed but doesn't use CPAP;       Objective:      Wt Readings from Last 3 Encounters:  07/28/24 230 lb (104.3 kg)  07/26/24 253 lb 12.8 oz (115.1 kg)  07/25/24 253 lb 6.4 oz (114.9 kg)      Vital signs reviewed  08/08/2024  - Note at rest 02 sats  ***% on ***   General appearance:    ***     I personally reviewed images and agree with radiology impression as follows:   Chest CTa    04/15/24  1. No pulmonary embolus or acute intrathoracic abnormality. 2. Sequela of prior granulomatous disease. 3. Small hiatal hernia.             Assessment

## 2024-08-10 NOTE — Telephone Encounter (Signed)
 Pt scheduled for appt and no showed

## 2024-08-12 ENCOUNTER — Ambulatory Visit
Payer: Self-pay | Attending: Thoracic Surgery (Cardiothoracic Vascular Surgery) | Admitting: Thoracic Surgery (Cardiothoracic Vascular Surgery)

## 2024-08-12 VITALS — BP 113/75 | HR 82 | Resp 18 | Ht 63.0 in | Wt 242.0 lb

## 2024-08-12 DIAGNOSIS — Z8719 Personal history of other diseases of the digestive system: Secondary | ICD-10-CM

## 2024-08-12 DIAGNOSIS — K449 Diaphragmatic hernia without obstruction or gangrene: Secondary | ICD-10-CM

## 2024-08-12 DIAGNOSIS — Z9889 Other specified postprocedural states: Secondary | ICD-10-CM

## 2024-08-12 NOTE — Progress Notes (Signed)
      [address]       [place] [address]             [phone]        Rhonda Patton [facility] Medical Record #[id number] Date of Birth: 09-13-1961  Referring: Hyacinth Honey, NP Primary Care: Shona Norleen PEDLAR, MD Primary Cardiologist:None  Reason for visit:   follow-up  History of Present Illness:     Rhonda Patton present for her first follow-up appointment after undergoing a hiatal hernia repair.  She denies any dysphagia or reflux.  She does have some incisional pain.  Physical Exam: There were no vitals taken for this visit.  Alert NAD Abdomen, ND No peripheral edema       Assessment / Plan:   63 y.o. female s/p hiatal hernia repair with fundoplication.  I have instructed her to advance her diet to for tender food, and then she can advance it further after week.  She will follow-up in 1 month with a chest x-ray   Rhonda Patton 08/12/2024 11:32 AM

## 2024-08-22 ENCOUNTER — Other Ambulatory Visit: Payer: Self-pay

## 2024-08-22 MED ORDER — TRAMADOL HCL 50 MG PO TABS: 50.0000 mg | ORAL_TABLET | Freq: Four times a day (QID) | ORAL | 0 refills | Status: AC | PRN

## 2024-08-22 NOTE — Progress Notes (Signed)
-  Sent in tramadol  for pain at this time -Continue to use tylenol  as needed -If pain persists and does not improve please reach out again to our clinic

## 2024-09-01 ENCOUNTER — Encounter: Payer: Self-pay | Admitting: Internal Medicine

## 2024-09-08 ENCOUNTER — Other Ambulatory Visit: Payer: Self-pay | Admitting: Thoracic Surgery (Cardiothoracic Vascular Surgery)

## 2024-09-08 DIAGNOSIS — K449 Diaphragmatic hernia without obstruction or gangrene: Secondary | ICD-10-CM

## 2024-09-09 ENCOUNTER — Ambulatory Visit
Admission: RE | Admit: 2024-09-09 | Discharge: 2024-09-09 | Disposition: A | Discharge status: Home / Self Care | Payer: Self-pay | Source: Ambulatory Visit | Attending: Cardiology | Admitting: Cardiology

## 2024-09-09 ENCOUNTER — Ambulatory Visit (INDEPENDENT_AMBULATORY_CARE_PROVIDER_SITE_OTHER): Payer: Self-pay | Admitting: Thoracic Surgery (Cardiothoracic Vascular Surgery)

## 2024-09-09 VITALS — BP 121/78 | HR 69 | Resp 18 | Ht 63.0 in | Wt 241.0 lb

## 2024-09-09 DIAGNOSIS — Z9889 Other specified postprocedural states: Secondary | ICD-10-CM | POA: Diagnosis present

## 2024-09-09 DIAGNOSIS — Z8719 Personal history of other diseases of the digestive system: Secondary | ICD-10-CM | POA: Insufficient documentation

## 2024-09-09 DIAGNOSIS — K6389 Other specified diseases of intestine: Secondary | ICD-10-CM | POA: Diagnosis not present

## 2024-09-09 DIAGNOSIS — K449 Diaphragmatic hernia without obstruction or gangrene: Secondary | ICD-10-CM | POA: Insufficient documentation

## 2024-09-09 NOTE — Progress Notes (Signed)
      [address]       [place] [address]             [phone]        ANNALYSA MOHAMMAD [facility] Medical Record #[id number] Date of Birth: [birth year]  Referring: Hyacinth Honey, NP Primary Care: Shona Norleen PEDLAR, MD Primary Cardiologist:None  Reason for visit:   follow-up  History of Present Illness:     Rhonda Patton present for her first follow-up appointment after undergoing a hiatal hernia repair.  She does have some dysphagia.  Physical Exam: BP 121/78 (BP Location: Left Arm)   Pulse 69   Resp 18   Ht 5' 3 (1.6 m)   Wt 241 lb (109.3 kg)   SpO2 99%   BMI 42.69 kg/m   Alert NAD Abdomen, ND No peripheral edema       Assessment / Plan:   63 y.o. female s/p hiatal hernia repair with fundoplication.  I explained to her that she had some motility dysfunction on her upper endoscopy and this may take several months to resolve.  Also recommended that she no longer take any of her antiacid since she underwent a fundoplication as well.  I will call her back in 2 months to assess her symptoms.  [name] 09/09/2024 3:43 PM

## 2024-11-11 ENCOUNTER — Ambulatory Visit
Attending: Thoracic Surgery (Cardiothoracic Vascular Surgery) | Admitting: Thoracic Surgery (Cardiothoracic Vascular Surgery)

## 2024-11-11 DIAGNOSIS — Z8719 Personal history of other diseases of the digestive system: Secondary | ICD-10-CM | POA: Diagnosis not present

## 2024-11-11 DIAGNOSIS — Z9889 Other specified postprocedural states: Secondary | ICD-10-CM

## 2024-11-11 NOTE — Progress Notes (Signed)
" °   °  [address] [place] [address]             [phone]     Patient: Home Provider: Office Consent for Telemedicine visit obtained.  Todays visit was completed via a real-time telehealth (see specific modality noted below). The patient/authorized person provided oral consent at the time of the visit to engage in a telemedicine encounter with the present provider at [facility]. The patient/authorized person was informed of the potential benefits, limitations, and risks of telemedicine. The patient/authorized person expressed understanding that the laws that protect confidentiality also apply to telemedicine. The patient/authorized person acknowledged understanding that telemedicine does not provide emergency services and that he or she would need to call 911 or proceed to the nearest hospital for help if such a need arose.   Total time spent in the clinical discussion 10 minutes.  Telehealth Modality: Phone visit (audio only)  I had a telephone visit with Rhonda Patton She underwent a hiatal hernia repair in October of 2025.  She continues to have dysphagia with meals, almost every other day.  I have made her an appointment to discuss an EGD with savory dilation.  Rhonda Patton   "

## 2024-11-18 ENCOUNTER — Encounter: Payer: Self-pay | Admitting: *Deleted

## 2024-11-18 ENCOUNTER — Other Ambulatory Visit: Payer: Self-pay | Admitting: Thoracic Surgery (Cardiothoracic Vascular Surgery)

## 2024-11-18 ENCOUNTER — Encounter: Payer: Self-pay | Admitting: Thoracic Surgery (Cardiothoracic Vascular Surgery)

## 2024-11-18 ENCOUNTER — Ambulatory Visit
Attending: Thoracic Surgery (Cardiothoracic Vascular Surgery) | Admitting: Thoracic Surgery (Cardiothoracic Vascular Surgery)

## 2024-11-18 ENCOUNTER — Other Ambulatory Visit: Payer: Self-pay | Admitting: *Deleted

## 2024-11-18 VITALS — BP 143/79 | HR 65 | Resp 20 | Ht 63.0 in | Wt 244.3 lb

## 2024-11-18 DIAGNOSIS — Z9889 Other specified postprocedural states: Secondary | ICD-10-CM | POA: Diagnosis not present

## 2024-11-18 DIAGNOSIS — R1310 Dysphagia, unspecified: Secondary | ICD-10-CM

## 2024-11-18 DIAGNOSIS — Z8719 Personal history of other diseases of the digestive system: Secondary | ICD-10-CM

## 2024-11-18 NOTE — Progress Notes (Signed)
" ° °   °  [address] [place] [address]             [phone]       [name] [facility] Medical Record #[id number] Date of Birth: Dec 31, 1960  Referring: Hyacinth Honey, NP Primary Care: Shona Norleen PEDLAR, MD Primary Cardiologist:None  Reason for visit:   follow-up  History of Present Illness:     Rhonda Patton present for follow-up.  She underwent a hiatal hernia repair with fundoplication in the fall of last year.  She was also noted to have Schatzki's ring prior to surgery.  She still complains of some dysphagia.  She denies any reflux.  Physical Exam: BP (!) 143/79 (BP Location: Left Arm, Patient Position: Sitting, Cuff Size: Large)   Pulse 65   Resp 20   Ht 5' 3 (1.6 m)   Wt 244 lb 4.8 oz (110.8 kg)   SpO2 98% Comment: RA  BMI 43.28 kg/m   Alert NAD Abdomen, ND No peripheral edema       Assessment / Plan:   64 y.o. female s/p hiatal hernia repair with fundoplication continues to have dysphagia.  She did have a Schatzki's ring, and some moderate motility on upper endoscopy.  I explained to her that it is we will obtain another esophagram, and plan for an upper endoscopy with savory dilation.   Rhonda Patton 11/18/2024 4:03 PM       "

## 2024-11-18 NOTE — H&P (View-Only) (Signed)
" ° °   °  [address] [place] [address]             [phone]       [name] [facility] Medical Record #[id number] Date of Birth: Dec 31, 1960  Referring: Hyacinth Honey, NP Primary Care: Shona Norleen PEDLAR, MD Primary Cardiologist:None  Reason for visit:   follow-up  History of Present Illness:     Rhonda Patton present for follow-up.  She underwent a hiatal hernia repair with fundoplication in the fall of last year.  She was also noted to have Schatzki's ring prior to surgery.  She still complains of some dysphagia.  She denies any reflux.  Physical Exam: BP (!) 143/79 (BP Location: Left Arm, Patient Position: Sitting, Cuff Size: Large)   Pulse 65   Resp 20   Ht 5' 3 (1.6 m)   Wt 244 lb 4.8 oz (110.8 kg)   SpO2 98% Comment: RA  BMI 43.28 kg/m   Alert NAD Abdomen, ND No peripheral edema       Assessment / Plan:   64 y.o. female s/p hiatal hernia repair with fundoplication continues to have dysphagia.  She did have a Schatzki's ring, and some moderate motility on upper endoscopy.  I explained to her that it is we will obtain another esophagram, and plan for an upper endoscopy with savory dilation.   Rhonda Patton 11/18/2024 4:03 PM       "

## 2024-11-21 ENCOUNTER — Other Ambulatory Visit: Payer: Self-pay | Admitting: Thoracic Surgery (Cardiothoracic Vascular Surgery)

## 2024-11-21 DIAGNOSIS — K449 Diaphragmatic hernia without obstruction or gangrene: Secondary | ICD-10-CM

## 2024-11-22 ENCOUNTER — Encounter (HOSPITAL_COMMUNITY): Payer: Self-pay | Admitting: Thoracic Surgery (Cardiothoracic Vascular Surgery)

## 2024-11-22 ENCOUNTER — Inpatient Hospital Stay (HOSPITAL_COMMUNITY)
Admission: RE | Admit: 2024-11-22 | Discharge: 2024-11-22 | Attending: Thoracic Surgery (Cardiothoracic Vascular Surgery)

## 2024-11-22 ENCOUNTER — Other Ambulatory Visit: Payer: Self-pay

## 2024-11-22 DIAGNOSIS — K449 Diaphragmatic hernia without obstruction or gangrene: Secondary | ICD-10-CM

## 2024-11-22 NOTE — Progress Notes (Signed)
 SDW call  Patient was given pre-op instructions over the phone. Patient verbalized understanding of instructions provided.  Denied any SOB, fever or cough   PCP - Dr. Norleen Hurst Cardiologist - denies Pulmonary:    PPM/ICD - denies Device Orders - na Rep Notified - na   Chest x-ray - DOS, 11/23/2024 EKG -  07/26/2024 Stress Test - ECHO - 05/12/2024 Cardiac Cath -   Sleep Study/sleep apnea/CPAP: denies  Type II diabetic. A1C 7.0 on 06/15/2024 Fasting Blood sugar range: 110's How often check sugars; twice a day Glipizide, hold the night before and DOS   Blood Thinner Instructions: denies Aspirin Instructions:denies   ERAS Protcol - NPO  Anesthesia review: No  Your procedure is scheduled on Wednesday November 23, 2024   Report to [facility] Main Entrance A at 0630   A.M., then check in with the Admitting office.  Call this number if you have problems the morning of surgery:  [phone]   If you have any questions prior to your surgery date call [phone]: Open Monday-Friday 8am-4pm If you experience any cold or flu symptoms such as cough, fever, chills, shortness of breath, etc. between now and your scheduled surgery, please notify us  at the above number    Remember:  Do not eat or drink after midnight the night before your surgery  Take these medicines the morning of surgery with A SIP OF WATER :  Symbicort  As needed: Tylenol , airspura, breo ellipta   As of today, STOP taking any Aspirin (unless otherwise instructed by your surgeon) Aleve, Naproxen, Ibuprofen , Motrin , Advil , Goody's, BC's, all herbal medications, fish oil, and all vitamins.

## 2024-11-23 ENCOUNTER — Encounter (HOSPITAL_COMMUNITY)
Admission: RE | Disposition: A | Payer: Self-pay | Source: Home / Self Care | Attending: Thoracic Surgery (Cardiothoracic Vascular Surgery)

## 2024-11-23 ENCOUNTER — Ambulatory Visit (HOSPITAL_COMMUNITY)

## 2024-11-23 ENCOUNTER — Other Ambulatory Visit: Payer: Self-pay

## 2024-11-23 ENCOUNTER — Ambulatory Visit (HOSPITAL_COMMUNITY)
Admission: RE | Admit: 2024-11-23 | Discharge: 2024-11-23 | Disposition: A | Source: Home / Self Care | Attending: Thoracic Surgery (Cardiothoracic Vascular Surgery) | Admitting: Thoracic Surgery (Cardiothoracic Vascular Surgery)

## 2024-11-23 ENCOUNTER — Encounter (HOSPITAL_COMMUNITY): Payer: Self-pay | Admitting: Thoracic Surgery (Cardiothoracic Vascular Surgery)

## 2024-11-23 DIAGNOSIS — R1310 Dysphagia, unspecified: Secondary | ICD-10-CM

## 2024-11-23 DIAGNOSIS — Z8719 Personal history of other diseases of the digestive system: Secondary | ICD-10-CM

## 2024-11-23 LAB — CBC
HCT: 41.6 % (ref 36.0–46.0)
Hemoglobin: 14.2 g/dL (ref 12.0–15.0)
MCH: 28.7 pg (ref 26.0–34.0)
MCHC: 34.1 g/dL (ref 30.0–36.0)
MCV: 84 fL (ref 80.0–100.0)
Platelets: 200 10*3/uL (ref 150–400)
RBC: 4.95 MIL/uL (ref 3.87–5.11)
RDW: 14.7 % (ref 11.5–15.5)
WBC: 5.1 10*3/uL (ref 4.0–10.5)
nRBC: 0 % (ref 0.0–0.2)

## 2024-11-23 LAB — COMPREHENSIVE METABOLIC PANEL WITH GFR
ALT: 23 U/L (ref 0–44)
AST: 24 U/L (ref 15–41)
Albumin: 4.6 g/dL (ref 3.5–5.0)
Alkaline Phosphatase: 96 U/L (ref 38–126)
Anion gap: 11 (ref 5–15)
BUN: 16 mg/dL (ref 8–23)
CO2: 26 mmol/L (ref 22–32)
Calcium: 9.8 mg/dL (ref 8.9–10.3)
Chloride: 104 mmol/L (ref 98–111)
Creatinine, Ser: 0.93 mg/dL (ref 0.44–1.00)
GFR, Estimated: >60 mL/min
Glucose, Bld: 119 mg/dL — ABNORMAL HIGH (ref 70–99)
Potassium: 3.7 mmol/L (ref 3.5–5.1)
Sodium: 141 mmol/L (ref 135–145)
Total Bilirubin: 0.5 mg/dL (ref 0.0–1.2)
Total Protein: 7.3 g/dL (ref 6.5–8.1)

## 2024-11-23 LAB — GLUCOSE, CAPILLARY
Glucose-Capillary: 121 mg/dL — ABNORMAL HIGH (ref 70–99)
Glucose-Capillary: 116 mg/dL — ABNORMAL HIGH (ref 70–99)

## 2024-11-23 LAB — PROTIME-INR
INR: 0.9 (ref 0.8–1.2)
Prothrombin Time: 13.1 s (ref 11.4–15.2)

## 2024-11-23 LAB — APTT: aPTT: 27 s (ref 24–36)

## 2024-11-23 MED ORDER — IPRATROPIUM-ALBUTEROL 0.5-2.5 (3) MG/3ML IN SOLN: 3.0000 mL | RESPIRATORY_TRACT | Status: DC | PRN

## 2024-11-23 MED ORDER — SUGAMMADEX SODIUM 200 MG/2ML IV SOLN
INTRAVENOUS | Status: DC | PRN
  Administered 2024-11-23: 200 mg via INTRAVENOUS

## 2024-11-23 MED ORDER — ROCURONIUM BROMIDE 10 MG/ML (PF) SYRINGE
PREFILLED_SYRINGE | INTRAVENOUS | Status: DC | PRN
  Administered 2024-11-23: 10 mg via INTRAVENOUS

## 2024-11-23 MED ORDER — ROCURONIUM BROMIDE 10 MG/ML (PF) SYRINGE
PREFILLED_SYRINGE | INTRAVENOUS | Status: AC
  Filled 2024-11-23: qty 10

## 2024-11-23 MED ORDER — ORAL CARE MOUTH RINSE: 15.0000 mL | Freq: Once | OROMUCOSAL | Status: AC

## 2024-11-23 MED ORDER — ACETAMINOPHEN 10 MG/ML IV SOLN
INTRAVENOUS | Status: AC
  Filled 2024-11-23: qty 100

## 2024-11-23 MED ORDER — PHENYLEPHRINE 80 MCG/ML (10ML) SYRINGE FOR IV PUSH (FOR BLOOD PRESSURE SUPPORT)
PREFILLED_SYRINGE | INTRAVENOUS | Status: DC | PRN
  Administered 2024-11-23: 160 ug via INTRAVENOUS
  Administered 2024-11-23 (×2): 80 ug via INTRAVENOUS
  Administered 2024-11-23: 160 ug via INTRAVENOUS

## 2024-11-23 MED ORDER — MINERAL OIL LIGHT OIL
TOPICAL_OIL | Status: DC | PRN
  Administered 2024-11-23: 1 via TOPICAL

## 2024-11-23 MED ORDER — OXYCODONE HCL 5 MG PO TABS: 5.0000 mg | ORAL_TABLET | Freq: Once | ORAL | Status: DC | PRN

## 2024-11-23 MED ORDER — 0.9 % SODIUM CHLORIDE (POUR BTL) OPTIME
TOPICAL | Status: DC | PRN
  Administered 2024-11-23: 1000 mL

## 2024-11-23 MED ORDER — DEXAMETHASONE SOD PHOSPHATE PF 10 MG/ML IJ SOLN
INTRAMUSCULAR | Status: AC
  Filled 2024-11-23: qty 1

## 2024-11-23 MED ORDER — INSULIN ASPART 100 UNIT/ML IJ SOLN: 0.0000 [IU] | INTRAMUSCULAR | Status: DC | PRN

## 2024-11-23 MED ORDER — OXYCODONE HCL 5 MG/5ML PO SOLN: 5.0000 mg | Freq: Once | ORAL | Status: DC | PRN

## 2024-11-23 MED ORDER — METOCLOPRAMIDE HCL 5 MG/ML IJ SOLN
10.0000 mg | Freq: Once | INTRAMUSCULAR | Status: AC
  Administered 2024-11-23: 5 mg via INTRAVENOUS

## 2024-11-23 MED ORDER — FENTANYL CITRATE (PF) 100 MCG/2ML IJ SOLN
INTRAMUSCULAR | Status: AC
  Filled 2024-11-23: qty 2

## 2024-11-23 MED ORDER — FENTANYL CITRATE (PF) 250 MCG/5ML IJ SOLN
INTRAMUSCULAR | Status: DC | PRN
  Administered 2024-11-23: 50 ug via INTRAVENOUS

## 2024-11-23 MED ORDER — METOCLOPRAMIDE HCL 5 MG/ML IJ SOLN
INTRAMUSCULAR | Status: AC
  Filled 2024-11-23: qty 2

## 2024-11-23 MED ORDER — ONDANSETRON HCL 4 MG/2ML IJ SOLN
INTRAMUSCULAR | Status: DC | PRN
  Administered 2024-11-23: 4 mg via INTRAVENOUS

## 2024-11-23 MED ORDER — ONDANSETRON HCL 4 MG/2ML IJ SOLN
INTRAMUSCULAR | Status: AC
  Filled 2024-11-23: qty 2

## 2024-11-23 MED ORDER — HYDROMORPHONE HCL 1 MG/ML IJ SOLN
0.2500 mg | INTRAMUSCULAR | Status: DC | PRN
  Administered 2024-11-23: 0.25 mg via INTRAVENOUS

## 2024-11-23 MED ORDER — CHLORHEXIDINE GLUCONATE 0.12 % MT SOLN
15.0000 mL | Freq: Once | OROMUCOSAL | Status: AC
  Administered 2024-11-23: 15 mL via OROMUCOSAL
  Filled 2024-11-23: qty 15

## 2024-11-23 MED ORDER — EPHEDRINE SULFATE (PRESSORS) 25 MG/5ML IV SOSY
PREFILLED_SYRINGE | INTRAVENOUS | Status: DC | PRN
  Administered 2024-11-23: 5 mg via INTRAVENOUS

## 2024-11-23 MED ORDER — SUCCINYLCHOLINE CHLORIDE 200 MG/10ML IV SOSY
PREFILLED_SYRINGE | INTRAVENOUS | Status: AC
  Filled 2024-11-23: qty 10

## 2024-11-23 MED ORDER — IPRATROPIUM-ALBUTEROL 0.5-2.5 (3) MG/3ML IN SOLN
RESPIRATORY_TRACT | Status: AC
  Filled 2024-11-23: qty 3

## 2024-11-23 MED ORDER — HYDROMORPHONE HCL 1 MG/ML IJ SOLN
INTRAMUSCULAR | Status: AC
  Filled 2024-11-23: qty 1

## 2024-11-23 MED ORDER — ONDANSETRON HCL 4 MG/2ML IJ SOLN: 4.0000 mg | Freq: Once | INTRAMUSCULAR | Status: DC | PRN

## 2024-11-23 MED ORDER — PHENYLEPHRINE 80 MCG/ML (10ML) SYRINGE FOR IV PUSH (FOR BLOOD PRESSURE SUPPORT)
PREFILLED_SYRINGE | INTRAVENOUS | Status: AC
  Filled 2024-11-23: qty 10

## 2024-11-23 MED ORDER — DEXAMETHASONE SOD PHOSPHATE PF 10 MG/ML IJ SOLN
INTRAMUSCULAR | Status: DC | PRN
  Administered 2024-11-23: 10 mg via INTRAVENOUS

## 2024-11-23 MED ORDER — LIDOCAINE 2% (20 MG/ML) 5 ML SYRINGE
INTRAMUSCULAR | Status: AC
  Filled 2024-11-23: qty 5

## 2024-11-23 MED ORDER — PROPOFOL 10 MG/ML IV BOLUS
INTRAVENOUS | Status: AC
  Filled 2024-11-23: qty 20

## 2024-11-23 MED ORDER — ACETAMINOPHEN 10 MG/ML IV SOLN
1000.0000 mg | Freq: Once | INTRAVENOUS | Status: DC | PRN
  Administered 2024-11-23: 1000 mg via INTRAVENOUS

## 2024-11-23 MED ORDER — LACTATED RINGERS IV SOLN: INTRAVENOUS | Status: DC

## 2024-11-23 MED ORDER — DIPHENHYDRAMINE HCL 50 MG/ML IJ SOLN: 25.0000 mg | Freq: Once | INTRAMUSCULAR | Status: DC

## 2024-11-23 MED ORDER — PROPOFOL 10 MG/ML IV BOLUS
INTRAVENOUS | Status: DC | PRN
  Administered 2024-11-23: 200 mg via INTRAVENOUS

## 2024-11-23 MED ORDER — AMISULPRIDE (ANTIEMETIC) 5 MG/2ML IV SOLN: 10.0000 mg | Freq: Once | INTRAVENOUS | Status: DC | PRN

## 2024-11-23 MED ORDER — SUCCINYLCHOLINE CHLORIDE 200 MG/10ML IV SOSY
PREFILLED_SYRINGE | INTRAVENOUS | Status: DC | PRN
  Administered 2024-11-23: 120 mg via INTRAVENOUS

## 2024-11-23 MED ORDER — LIDOCAINE 2% (20 MG/ML) 5 ML SYRINGE
INTRAMUSCULAR | Status: DC | PRN
  Administered 2024-11-23: 100 mg via INTRAVENOUS

## 2024-11-23 NOTE — Transfer of Care (Signed)
 Immediate Anesthesia Transfer of Care Note  Patient: Rhonda Patton  Procedure(s) Performed: EGD (ESOPHAGOGASTRODUODENOSCOPY)  Patient Location: PACU  Anesthesia Type:General  Level of Consciousness: awake and sedated  Airway & Oxygen Therapy: Patient Spontanous Breathing and Patient connected to face mask oxygen  Post-op Assessment: Report given to RN and Post -op Vital signs reviewed and stable  Post vital signs: Reviewed and stable  Last Vitals:  Vitals Value Taken Time  BP 90/53 11/23/24 09:16  Temp    Pulse 62 11/23/24 09:19  Resp 19 11/23/24 09:18  SpO2 91 % 11/23/24 09:19  Vitals shown include unfiled device data.  Last Pain:  Vitals:   11/23/24 0655  TempSrc: Oral      Patients Stated Pain Goal: 0 (11/23/24 0708)  Complications: No notable events documented.

## 2024-11-23 NOTE — Interval H&P Note (Signed)
 History and Physical Interval Note:  11/23/2024 8:21 AM  Rhonda Patton  has presented today for surgery, with the diagnosis of DYSPHAGIA.  The various methods of treatment have been discussed with the patient and family. After consideration of risks, benefits and other options for treatment, the patient has consented to  Procedures with comments: EGD (ESOPHAGOGASTRODUODENOSCOPY) (N/A) DILATION, ESOPHAGUS (N/A) - SAVORY DILATION as a surgical intervention.  The patient's history has been reviewed, patient examined, no change in status, stable for surgery.  I have reviewed the patient's chart and labs.  Questions were answered to the patient's satisfaction.     Ezequiel Macauley MALVA Rayas

## 2024-11-23 NOTE — Anesthesia Postprocedure Evaluation (Signed)
"   Anesthesia Post Note  Patient: Rhonda Patton  Procedure(s) Performed: EGD (ESOPHAGOGASTRODUODENOSCOPY)     Patient location during evaluation: PACU Anesthesia Type: General Level of consciousness: awake Pain management: pain level controlled Vital Signs Assessment: post-procedure vital signs reviewed and stable Respiratory status: spontaneous breathing Cardiovascular status: stable Postop Assessment: no apparent nausea or vomiting and adequate PO intake Anesthetic complications: no   No notable events documented.              [name]      "

## 2024-11-23 NOTE — Anesthesia Procedure Notes (Signed)
 Procedure Name: Intubation Date/Time: 11/23/2024 8:42 AM  Performed by: Delores Dus, CRNAPre-anesthesia Checklist: Patient identified, Emergency Drugs available, Suction available and Patient being monitored Patient Re-evaluated:Patient Re-evaluated prior to induction Oxygen Delivery Method: Circle system utilized Preoxygenation: Pre-oxygenation with 100% oxygen Induction Type: IV induction Ventilation: Mask ventilation without difficulty Laryngoscope Size: Miller and 2 Grade View: Grade II Tube type: Oral Tube size: 7.0 mm Number of attempts: 1 Airway Equipment and Method: Stylet and Oral airway Placement Confirmation: ETT inserted through vocal cords under direct vision, positive ETCO2 and breath sounds checked- equal and bilateral Secured at: 22 cm Tube secured with: Tape Dental Injury: Teeth and Oropharynx as per pre-operative assessment

## 2024-11-23 NOTE — Anesthesia Preprocedure Evaluation (Addendum)
"                                    Anesthesia Evaluation  Patient identified by MRN, date of birth, ID band Patient awake    Reviewed: Allergy & Precautions, NPO status , Patient's Chart, lab work & pertinent test results  History of Anesthesia Complications (+) PONV and history of anesthetic complications  Airway Mallampati: II  TM Distance: >3 FB Neck ROM: Full    Dental  (+) Teeth Intact, Dental Advisory Given   Pulmonary asthma (on Breo Ellipta , Symbicort and Airsupra) , Sleep apnea: NO CPAP use.     + wheezing      Cardiovascular hypertension (HCTZ), Pt. on medications  Rhythm:Regular Rate:Normal   TTE (04/2024) 1. Left ventricular ejection fraction, by estimation, is 60 to 65%. The left ventricle has normal function. The left ventricle has no regional wall motion abnormalities. There is mild concentric left ventricular hypertrophy. Left ventricular diastolic parameters are indeterminate.   2. Right ventricular systolic function is normal. The right ventricular size is normal. Tricuspid regurgitation signal is inadequate for assessing PA pressure.   3. The mitral valve is grossly normal. Trivial mitral valve regurgitation.   4. The aortic valve is tricuspid. Aortic valve regurgitation is mild. Aortic valve sclerosis is present, with no evidence of aortic valve stenosis. Aortic valve mean gradient measures 5.0 mmHg.   5. The inferior vena cava is normal in size with greater than 50%  respiratory variability, suggesting right atrial pressure of 3 mmHg.     Neuro/Psych  Headaches    GI/Hepatic hiatal hernia (s/p Nissen Fundiplication),GERD  Medicated and Controlled,, Hx of Paraesophageal Hernia Repair (07/2024)  Post-Surgical Esophageal Dysphagia     Endo/Other  diabetes, Well Controlled, Type 2    Renal/GU Renal disease     Musculoskeletal  (+) Arthritis ,    Abdominal   Peds  Hematology   Anesthesia Other Findings    Reproductive/Obstetrics                              Anesthesia Physical Anesthesia Plan  ASA: 3  Anesthesia Plan: General   Post-op Pain Management:    Induction: Intravenous and Rapid sequence  PONV Risk Score and Plan: 3 and Ondansetron , Dexamethasone  and Treatment may vary due to age or medical condition  Airway Management Planned: Oral ETT  Additional Equipment: None  Intra-op Plan:   Post-operative Plan: Extubation in OR  Informed Consent: I have reviewed the patients History and Physical, chart, labs and discussed the procedure including the risks, benefits and alternatives for the proposed anesthesia with the patient or authorized representative who has indicated his/her understanding and acceptance.     Dental advisory given  Plan Discussed with: CRNA  Anesthesia Plan Comments:          Anesthesia Quick Evaluation  "

## 2024-11-23 NOTE — Op Note (Signed)
" ° °   °  [address] [place] [address]             [phone]      11/23/2024  Patient:  Rhonda Patton Pre-Op Dx: dysphagia     Hx of hiatal hernia repair Post-op Dx:  same Procedure: - Esophagogastroscopy   Surgeon and Role:      * Shyrl Linnie KIDD, MD - Primary Anesthesia  general EBL:  none Blood Administration: non Specimen:  none   Counts: correct   Indications: 64 y.o. female s/p hiatal hernia repair with fundoplication continues to have dysphagia.  She did have a Schatzki's ring, and some moderate motility on upper endoscopy.  I explained to her that it is we will obtain another esophagram, and plan for an upper endoscopy with savory dilation  Findings: Diverticulum noted at 20cm from the incisors.  Normal opening at GEJ.  Operative Technique: After the risks, benefits and alternatives were thoroughly discussed, the patient was brought to the operative theatre.  Anesthesia was induced. The patient was prepped and draped in normal sterile fashion.  An appropriate surgical pause was performed, and pre-operative antibiotics were dosed accordingly.  The gastroscope was advanced through the oropharynx into the cervical esophagus under direct visualization.  The scope was passed into the stomach.  The scope was then pulled back, and the esophageal mucosa was visualized.  A diverticulum was noted at 20 cm from the incisors.   The patient tolerated the procedure without any immediate complications, and was transferred to the PACU in stable condition.  Hakiem Malizia O Arkel Cartwright  "

## 2024-11-24 ENCOUNTER — Encounter (HOSPITAL_COMMUNITY): Payer: Self-pay | Admitting: Thoracic Surgery (Cardiothoracic Vascular Surgery)

## 2024-11-25 ENCOUNTER — Other Ambulatory Visit: Payer: Self-pay | Admitting: Thoracic Surgery (Cardiothoracic Vascular Surgery)

## 2024-11-25 DIAGNOSIS — Z8719 Personal history of other diseases of the digestive system: Secondary | ICD-10-CM

## 2024-11-28 ENCOUNTER — Ambulatory Visit (HOSPITAL_COMMUNITY)

## 2024-12-02 ENCOUNTER — Ambulatory Visit: Admitting: Thoracic Surgery (Cardiothoracic Vascular Surgery)
